# Patient Record
Sex: Male | Born: 1965
Health system: Southern US, Community
[De-identification: ages and names within clinical notes are randomized; demographics above are authoritative.]

## PROBLEM LIST (undated history)

## (undated) DIAGNOSIS — E785 Hyperlipidemia, unspecified: Secondary | ICD-10-CM

## (undated) DIAGNOSIS — E119 Type 2 diabetes mellitus without complications: Secondary | ICD-10-CM

## (undated) HISTORY — DX: Type 2 diabetes mellitus without complications: E11.9

## (undated) HISTORY — PX: ANKLE SURGERY: SHX546

## (undated) HISTORY — DX: Hyperlipidemia, unspecified: E78.5

---

## 1997-09-29 ENCOUNTER — Emergency Department (HOSPITAL_COMMUNITY): Admission: EM | Admit: 1997-09-29 | Discharge: 1997-09-29 | Payer: Self-pay | Admitting: Emergency Medicine

## 1998-04-18 ENCOUNTER — Emergency Department (HOSPITAL_COMMUNITY): Admission: EM | Admit: 1998-04-18 | Discharge: 1998-04-18 | Payer: Self-pay | Admitting: Emergency Medicine

## 1999-04-17 ENCOUNTER — Encounter: Payer: Self-pay | Admitting: Emergency Medicine

## 1999-04-17 ENCOUNTER — Emergency Department (HOSPITAL_COMMUNITY): Admission: EM | Admit: 1999-04-17 | Discharge: 1999-04-17 | Payer: Self-pay | Admitting: Emergency Medicine

## 2006-10-08 ENCOUNTER — Emergency Department (HOSPITAL_COMMUNITY): Admission: EM | Admit: 2006-10-08 | Discharge: 2006-10-08 | Payer: Self-pay | Admitting: Emergency Medicine

## 2008-05-24 ENCOUNTER — Emergency Department (HOSPITAL_COMMUNITY): Admission: EM | Admit: 2008-05-24 | Discharge: 2008-05-24 | Payer: Self-pay | Admitting: Emergency Medicine

## 2009-10-25 ENCOUNTER — Emergency Department (HOSPITAL_COMMUNITY): Admission: EM | Admit: 2009-10-25 | Discharge: 2009-10-25 | Payer: Self-pay | Admitting: Emergency Medicine

## 2009-11-02 ENCOUNTER — Ambulatory Visit (HOSPITAL_COMMUNITY): Admission: RE | Admit: 2009-11-02 | Discharge: 2009-11-02 | Payer: Self-pay | Admitting: Orthopedic Surgery

## 2010-05-15 ENCOUNTER — Inpatient Hospital Stay (INDEPENDENT_AMBULATORY_CARE_PROVIDER_SITE_OTHER)
Admission: RE | Admit: 2010-05-15 | Discharge: 2010-05-15 | Disposition: A | Discharge status: Home / Self Care | Payer: No Typology Code available for payment source | Source: Ambulatory Visit | Attending: Family Medicine | Admitting: Family Medicine

## 2010-05-15 DIAGNOSIS — S139XXA Sprain of joints and ligaments of unspecified parts of neck, initial encounter: Secondary | ICD-10-CM

## 2010-06-07 LAB — CBC
HCT: 43.8 % (ref 39.0–52.0)
MCHC: 35.4 g/dL (ref 30.0–36.0)
MCV: 87.8 fL (ref 78.0–100.0)
RDW: 12.5 % (ref 11.5–15.5)

## 2010-06-07 LAB — TYPE AND SCREEN

## 2010-06-07 LAB — SURGICAL PCR SCREEN: MRSA, PCR: NEGATIVE

## 2013-03-24 DIAGNOSIS — E119 Type 2 diabetes mellitus without complications: Secondary | ICD-10-CM

## 2013-03-24 HISTORY — DX: Type 2 diabetes mellitus without complications: E11.9

## 2014-02-22 ENCOUNTER — Ambulatory Visit (INDEPENDENT_AMBULATORY_CARE_PROVIDER_SITE_OTHER): Payer: Self-pay | Admitting: Family Medicine

## 2014-02-22 VITALS — BP 120/72 | HR 70 | Temp 98.4°F | Resp 18 | Ht 64.0 in | Wt 163.0 lb

## 2014-02-22 DIAGNOSIS — M940 Chondrocostal junction syndrome [Tietze]: Secondary | ICD-10-CM

## 2014-02-22 DIAGNOSIS — M62830 Muscle spasm of back: Secondary | ICD-10-CM

## 2014-02-22 MED ORDER — CYCLOBENZAPRINE HCL 10 MG PO TABS: 10.0000 mg | ORAL_TABLET | Freq: Every day | ORAL | Status: DC

## 2014-02-22 MED ORDER — MELOXICAM 15 MG PO TABS: 15.0000 mg | ORAL_TABLET | Freq: Every day | ORAL | Status: DC

## 2014-02-22 NOTE — Progress Notes (Signed)
    MRN: [id number] DOB: 03/28/1965  Subjective:   Arthur Howard is a 48 y.o. male presenting for 1 week history of bilateral flank/back pain L>R. Patient is a Immunologist, works with insulation, carries heavy loads. He cannot recall any specific injury or trauma, no falls from slipping outside while it was raining. His pain last 2 days, was worsened with deep breathing, had difficulty sleeping, getting comfortable in his bed. He used no medications and eventually his pain subsided, he continued work as normal. Administrator, Civil Service, his pain began again without any known precipitants, he used Tylenol once today with no relief. His pain is moderate in severity, achy with intermittent sharp pains, non-radiating, located over lateral mid back to flank sides bilaterally. He denies fevers, n/v, cough, chest pain, shob, wheezing, dysuria, hematuria, constipation, abdominal pain, decreased ROM or sensation, radiculopathy, loss of bowel or bladder control. Denies smoking (stopped 4 months ago), occasional alcohol use. Denies IV drug use. Denies any other aggravating or relieving factors, no other questions or concerns.  No current medications.  No Known Allergies  Denies past medical history.   Past Surgical History  Procedure Laterality Date  . Ankle surgery      ROS As in subjective.  Objective:   Vitals: BP 120/72 mmHg  Pulse 70  Temp(Src) 98.4 F (36.9 C) (Oral)  Resp 18  Ht 5\' 4"  (1.626 m)  Wt 163 lb (73.936 kg)  BMI 27.97 kg/m2  SpO2 96%  Physical Exam  Constitutional: He is oriented to person, place, and time and well-developed, well-nourished, and in no distress.  Cardiovascular: Normal rate, regular rhythm, normal heart sounds and intact distal pulses.  Exam reveals no gallop and no friction rub.   No murmur heard. Pulmonary/Chest: Effort normal. No respiratory distress. He has no wheezes. He has no rales. He exhibits no tenderness.  Abdominal: Soft. Bowel sounds are normal. He  exhibits no distension and no mass. There is no tenderness. There is no guarding.  Musculoskeletal: He exhibits tenderness (over postero-lateral costal area bilaterally most notable with deep inspiration). He exhibits no edema.  Excellent ROM, no spinous process or paraspinal muscle tenderness, spasms present bilaterally over thoraco-lumbar region. Negative straight leg raise.  Neurological: He is alert and oriented to person, place, and time. He has normal reflexes.  Skin: Skin is warm and dry. No rash noted. He is not diaphoretic. No erythema.  Psychiatric: Mood and affect normal.   Assessment and Plan :   1. Costochondritis - meloxicam (MOBIC) 15 MG tablet; Take 1 tablet (15 mg total) by mouth daily.  Dispense: 30 tablet; Refill: 1 - return to clinic if symptoms worsen, fail to resolve or as needed  2. Spasm of thoracolumbar muscle - f/u as above - cyclobenzaprine (FLEXERIL) 10 MG tablet; Take 1 tablet (10 mg total) by mouth at bedtime.  Dispense: 30 tablet; Refill: 0  3. Advised patient to return to clinic for an annual physical exam.   Jaynee Eagles, PA-C [facility] [phone] 02/22/2014 7:39 PM

## 2014-02-22 NOTE — Patient Instructions (Signed)
Costocondritis (Costochondritis) La costocondritis a veces llamada sndrome de [place] es la hinchazn e irritacin (inflamacin) del tejido Statistician) que une las costillas con el (esternn). Esto causa dolor en el pecho y la zona de las [place]. La costocondritis generalmente desaparece por s misma con el tiempo. Podr tardar Waylan Boga 6 semanas en curarse o ms, especialmente si no puede restringir sus actividades. CAUSAS  Algunos casos de costocondritis no tienen causa conocida. Las causas posibles son:  Kathee Delton (traumatismos).  Ejercicios o actividades relacionadas con [employer].  Tos intensa. SIGNOS Y SNTOMAS  Dolor y sensibilidad en el rea de las [place].  Dolor que empeora al toser o respirar profundamente.  Dolor que empeora con movimientos especficos. DIAGNSTICO  El mdico le preguntar acerca de sus sntomas y le har un examen fsico. Podr indicarle radiografas para descartar otros problemas. TRATAMIENTO  La costocondritis generalmente desaparece por s misma con el tiempo. El mdico podr recetar algunos medicamentos para Glass blower/designer. INSTRUCCIONES PARA EL CUIDADO EN EL HOGAR   Evite la actividad fsica extenuante. Trate de no esforzar las [employer] actividades habituales. Aqu se incluyen las actividades en las que [place] los msculos del pecho, abdominales y los msculos laterales, especialmente si debe levantar peso.  Aplique hielo en la zona afectada durante los primeros 2 das despus del inicio del dolor.  Ponga el hielo en una bolsa plstica.  Colquese una toalla entre la piel y la bolsa de hielo.  Deje el hielo durante 20 minutos, y aplquelo 2-3 veces por Training and development officer.  Tome slo medicamentos de venta libre o recetados, segn las indicaciones del mdico. SOLICITE ATENCIN MDICA SI:  Tiene hinchazn o irritacin en las articulaciones [place]. Estos son signos de infeccin.  El dolor no desaparece aunque haga reposo o tome  medicamentos para Conservation officer, historic buildings. SOLICITE ATENCIN MDICA DE INMEDIATO SI:   El dolor aumenta o siente muchas molestias.  Le falta el aire o tiene dificultad para respirar.  Tose y escupe sangre.  Siente falta de aire o dolor en el pecho, sudoracin o vmitos.  Tiene fiebre o sntomas persistentes durante ms de 2 - 3 das.  Tiene fiebre y los sntomas empeoran repentinamente. ASEGRESE DE QUE:   Comprende estas instrucciones.  Controlar su afeccin.  Recibir ayuda de inmediato si no mejora o si empeora. Document Released: 12/18/2004 Document Revised: 12/29/2012 [facility] Patient Information 2015 [place]. This information is not intended to replace advice given to you by your health care provider. Make sure you discuss any questions you have with your health care provider.

## 2014-02-23 NOTE — Addendum Note (Signed)
Addended by: Jaynee Eagles on: 02/23/2014 01:40 PM   Modules accepted: Level of Service

## 2014-02-23 NOTE — Progress Notes (Signed)
Patient discussed with Arthur Howard. Agree with assessment and plan of care per his note.   

## 2014-08-12 ENCOUNTER — Ambulatory Visit (INDEPENDENT_AMBULATORY_CARE_PROVIDER_SITE_OTHER): Payer: Self-pay | Admitting: Internal Medicine

## 2014-08-12 VITALS — BP 112/64 | HR 70 | Temp 98.3°F | Ht 64.0 in | Wt 153.8 lb

## 2014-08-12 DIAGNOSIS — E113293 Type 2 diabetes mellitus with mild nonproliferative diabetic retinopathy without macular edema, bilateral: Secondary | ICD-10-CM

## 2014-08-12 DIAGNOSIS — E119 Type 2 diabetes mellitus without complications: Secondary | ICD-10-CM | POA: Insufficient documentation

## 2014-08-12 DIAGNOSIS — E1169 Type 2 diabetes mellitus with other specified complication: Secondary | ICD-10-CM | POA: Insufficient documentation

## 2014-08-12 DIAGNOSIS — R42 Dizziness and giddiness: Secondary | ICD-10-CM

## 2014-08-12 DIAGNOSIS — E1165 Type 2 diabetes mellitus with hyperglycemia: Secondary | ICD-10-CM

## 2014-08-12 DIAGNOSIS — Z809 Family history of malignant neoplasm, unspecified: Secondary | ICD-10-CM

## 2014-08-12 DIAGNOSIS — Z7189 Other specified counseling: Secondary | ICD-10-CM

## 2014-08-12 LAB — POCT CBC
Granulocyte percent: 59.3 %G (ref 37–80)
HEMATOCRIT: 47 % (ref 43.5–53.7)
Hemoglobin: 16.1 g/dL (ref 14.1–18.1)
LYMPH, POC: 2.5 (ref 0.6–3.4)
MCH: 30.2 pg (ref 27–31.2)
MCHC: 34.2 g/dL (ref 31.8–35.4)
MCV: 88.3 fL (ref 80–97)
MID (cbc): 0.3 (ref 0–0.9)
MPV: 6.6 fL (ref 0–99.8)
PLATELET COUNT, POC: 355 10*3/uL (ref 142–424)
POC GRANULOCYTE: 4.2 (ref 2–6.9)
POC LYMPH PERCENT: 36.4 %L (ref 10–50)
POC MID %: 4.3 % (ref 0–12)
RBC: 5.32 M/uL (ref 4.69–6.13)
RDW, POC: 12.8 %
WBC: 7 10*3/uL (ref 4.6–10.2)

## 2014-08-12 LAB — COMPREHENSIVE METABOLIC PANEL
ALT: 24 U/L (ref 0–53)
AST: 21 U/L (ref 0–37)
Albumin: 4.5 g/dL (ref 3.5–5.2)
Alkaline Phosphatase: 64 U/L (ref 39–117)
BUN: 20 mg/dL (ref 6–23)
CALCIUM: 10.1 mg/dL (ref 8.4–10.5)
CO2: 26 meq/L (ref 19–32)
CREATININE: 0.96 mg/dL (ref 0.50–1.35)
Chloride: 101 mEq/L (ref 96–112)
GLUCOSE: 230 mg/dL — AB (ref 70–99)
Potassium: 5.1 mEq/L (ref 3.5–5.3)
Sodium: 136 mEq/L (ref 135–145)
TOTAL PROTEIN: 7.3 g/dL (ref 6.0–8.3)
Total Bilirubin: 0.8 mg/dL (ref 0.2–1.2)

## 2014-08-12 LAB — POCT GLYCOSYLATED HEMOGLOBIN (HGB A1C): Hemoglobin A1C: 8.6

## 2014-08-12 LAB — GLUCOSE, POCT (MANUAL RESULT ENTRY): POC Glucose: 220 mg/dl — AB (ref 70–99)

## 2014-08-12 MED ORDER — METFORMIN HCL 500 MG PO TABS: 500.0000 mg | ORAL_TABLET | Freq: Two times a day (BID) | ORAL | Status: DC

## 2014-08-12 NOTE — Patient Instructions (Addendum)
Immunization Schedule, Adult  Influenza vaccine.  All adults should be immunized every year.  All adults, including pregnant women and people with hives-only allergy to eggs can receive the inactivated influenza (IIV) vaccine.  Adults aged 51-49 years can receive the recombinant influenza (RIV) vaccine. The RIV vaccine does not contain any egg protein.  Adults aged 76 years or older can receive the standard-dose IIV or the high-dose IIV.  Tetanus, diphtheria, and acellular pertussis (Td, Tdap) vaccine.  Pregnant women should receive 1 dose of Tdap vaccine during each pregnancy. The dose should be obtained regardless of the length of time since the last dose. Immunization is preferred during the 27th to 36th week of gestation.  An adult who has not previously received Tdap or who does not know his or her vaccine status should receive 1 dose of Tdap. This initial dose should be followed by tetanus and diphtheria toxoids (Td) booster doses every 10 years.  Adults with an unknown or incomplete history of completing a 3-dose immunization series with Td-containing vaccines should begin or complete a primary immunization series including a Tdap dose.  Adults should receive a Td booster every 10 years.  Varicella vaccine.  An adult without evidence of immunity to varicella should receive 2 doses or a second dose if he or she has previously received 1 dose.  Pregnant females who do not have evidence of immunity should receive the first dose after pregnancy. This first dose should be obtained before leaving the health care facility. The second dose should be obtained 4-8 weeks after the first dose.  Human papillomavirus (HPV) vaccine.  Females aged 13-26 years who have not received the vaccine previously should obtain the 3-dose series.  The vaccine is not recommended for use in pregnant females. However, pregnancy testing is not needed before receiving a dose. If a male is found to be  pregnant after receiving a dose, no treatment is needed. In that case, the remaining doses should be delayed until after the pregnancy.  Males aged 37-21 years who have not received the vaccine previously should receive the 3-dose series. Males aged 22-26 years may be immunized.  Immunization is recommended through the age of 93 years for any male who has sex with males and did not get any or all doses earlier.  Immunization is recommended for any person with an immunocompromised condition through the age of 71 years if he or she did not get any or all doses earlier.  During the 3-dose series, the second dose should be obtained 4-8 weeks after the first dose. The third dose should be obtained 24 weeks after the first dose and 16 weeks after the second dose.  Zoster vaccine.  One dose is recommended for adults aged 73 years or older unless certain conditions are present.  Measles, mumps, and rubella (MMR) vaccine.  Adults born before 35 generally are considered immune to measles and mumps.  Adults born in [birth year] or later should have 1 or more doses of MMR vaccine unless there is a contraindication to the vaccine or there is laboratory evidence of immunity to each of the three diseases.  A routine second dose of MMR vaccine should be obtained at least 28 days after the first dose for students attending postsecondary schools, health care workers, or international travelers.  People who received inactivated measles vaccine or an unknown type of measles vaccine during 1963-1967 should receive 2 doses of MMR vaccine.  People who received inactivated mumps vaccine or an unknown type  of mumps vaccine before 1979 and are at high risk for mumps infection should consider immunization with 2 doses of MMR vaccine.  For females of childbearing age, rubella immunity should be determined. If there is no evidence of immunity, females who are not pregnant should be vaccinated. If there is no evidence of  immunity, females who are pregnant should delay immunization until after pregnancy.  Unvaccinated health care workers born before 1957 who lack laboratory evidence of measles, mumps, or rubella immunity or laboratory confirmation of disease should consider measles and mumps immunization with 2 doses of MMR vaccine or rubella immunization with 1 dose of MMR vaccine.  Pneumococcal 13-valent conjugate (PCV13) vaccine.  When indicated, a person who is uncertain of his or her immunization history and has no record of immunization should receive the PCV13 vaccine.  An adult aged 19 years or older who has certain medical conditions and has not been previously immunized should receive 1 dose of PCV13 vaccine. This PCV13 should be followed with a dose of pneumococcal polysaccharide (PPSV23) vaccine. The PPSV23 vaccine dose should be obtained at least 8 weeks after the dose of PCV13 vaccine.  An adult aged 19 years or older who has certain medical conditions and previously received 1 or more doses of PPSV23 vaccine should receive 1 dose of PCV13. The PCV13 vaccine dose should be obtained 1 or more years after the last PPSV23 vaccine dose.  Pneumococcal polysaccharide (PPSV23) vaccine.  When PCV13 is also indicated, PCV13 should be obtained first.  All adults aged 65 years and older should be immunized.  An adult younger than age 65 years who has certain medical conditions should be immunized.  Any person who resides in a nursing home or long-term care facility should be immunized.  An adult smoker should be immunized.  People with an immunocompromised condition and certain other conditions should receive both PCV13 and PPSV23 vaccines.  People with human immunodeficiency virus (HIV) infection should be immunized as soon as possible after diagnosis.  Immunization during chemotherapy or radiation therapy should be avoided.  Routine use of PPSV23 vaccine is not recommended for American Indians,  [place] Natives, or people younger than 65 years unless there are medical conditions that require PPSV23 vaccine.  When indicated, people who have unknown immunization and have no record of immunization should receive PPSV23 vaccine.  One-time revaccination 5 years after the first dose of PPSV23 is recommended for people aged 19-64 years who have chronic kidney failure, nephrotic syndrome, asplenia, or immunocompromised conditions.  People who received 1-2 doses of PPSV23 before age 65 years should receive another dose of PPSV23 vaccine at age 65 years or later if at least 5 years have passed since the previous dose.  Doses of PPSV23 are not needed for people immunized with PPSV23 at or after age 65 years.  Meningococcal vaccine.  Adults with asplenia or persistent complement component deficiencies should receive 2 doses of quadrivalent meningococcal conjugate (MenACWY-D) vaccine. The doses should be obtained at least 2 months apart.  Microbiologists working with certain meningococcal bacteria, military recruits, people at risk during an outbreak, and people who travel to or live in countries with a high rate of meningitis should be immunized.  A first-year college student up through age 21 years who is living in a residence hall should receive a dose if he or she did not receive a dose on or after his or her 16th birthday.  Adults who have certain high-risk conditions should receive one or more doses   of vaccine.  Hepatitis A vaccine.  Adults who wish to be protected from this disease, have certain high-risk conditions, work with hepatitis A-infected animals, work in hepatitis A research labs, or travel to or work in countries with a high rate of hepatitis A should be immunized.  Adults who were previously unvaccinated and who anticipate close contact with an international adoptee during the first 60 days after arrival in the [place] from a country with a high rate of hepatitis A should  be immunized.  Hepatitis B vaccine.  Adults who wish to be protected from this disease, have certain high-risk conditions, may be exposed to blood or other infectious body fluids, are household contacts or sex partners of hepatitis B positive people, are clients or workers in certain care facilities, or travel to or work in countries with a high rate of hepatitis B should be immunized.  Haemophilus influenzae type b (Hib) vaccine.  A previously unvaccinated person with asplenia or sickle cell disease or having a scheduled splenectomy should receive 1 dose of Hib vaccine.  Regardless of previous immunization, a recipient of a hematopoietic stem cell transplant should receive a 3-dose series 6-12 months after his or her successful transplant.  Hib vaccine is not recommended for adults with HIV infection. Document Released: 05/31/2003 Document Revised: 07/05/2012 Document Reviewed: 04/27/2012 [facility] Patient Information 2015 [place], [place]. This information is not intended to replace advice given to you by your health care provider. Make sure you discuss any questions you have with your health care provider. Calendario de vacunacin - Adultos  (Immunization Schedule, Adult)  [place] antigripal.  Todos los adultos deben vacunarse todos los [place].  [employer], incluidas las mujeres embarazadas y las personas con urticaria por alergia al huevo pueden recibir la vacuna antigripal inactivada (IIV).  Los [employer] 18 y 68 aos pueden recibir la vacuna contra la influenza recombinante (RIV). La vacuna RIV no contiene protenas del huevo.  Los adultos de 65 aos o ms pueden recibir la dosis de IIV estndar o IIV en dosis altas.  Vacuna contra difteria, ttanos y tos Dietitian (Td,Tdap).  Las mujeres embarazadas deben recibir 1 dosis de vacuna Tdap en cada embarazo. La dosis debe aplicarse independientemente del tiempo transcurrido desde la ltima dosis. Se prefiere la vacunacin  entre las semanas 27 y 16 de gestacin.  Un adulto que no ha recibido Garment/textile technologist vacuna Tdap o que no sabe su estado de vacunacin deben recibir 1 dosis. Esta dosis inicial debe ser seguida por una dosis de refuerzo de toxoides tetnico y diftrico (Td) cada 10 aos.  Los adultos con una historia desconocida o incompleta de vacunacin de 3 dosis de las vacunas Td deben comenzar o completar una serie de vacunas primaria incluyendo una dosis de Tdap.  Los adultos deben recibir una dosis de refuerzo de Td cada 10 aos.  Vacuna contra la varicela.  Todos los adultos sin evidencia de inmunidad a la varicela deben recibir 2 dosis o una segunda dosis si slo han recibido 1 dosis.  Las mujeres embarazadas que no tengan evidencia de inmunidad deben recibir la primera dosis despus del Media planner. La primera dosis debe aplicarse antes de abandonar el establecimiento sanitario. La segunda dosis debe aplicarse de 4 a 8 semanas despus de la primera dosis.  Vacuna contra el virus del Engineer, technical sales (VPH).  Las mujeres de 13 a 26 aos que no han recibido la vacuna previamente deben recibir Ardelia Mems serie de 3 dosis.  La vacuna no  se recomienda en mujeres embarazadas. Sin embargo, no es Chartered loss adjuster una prueba de [place] antes de recibir una dosis. Si se comprueba que una mujer est embarazada despus de haber recibido Ardelia Mems dosis, no necesita tratamiento. En ese caso, las dosis restantes deben retrasarse hasta despus del embarazo.  Los hombres de 13 a 21 aos que no han recibido la vacuna previamente deben recibir Ardelia Mems serie de 3 dosis. Los [employer] 22 y 80 aos deben vacunarse.  Se recomienda [employer] [address] a todos los hombres que tengan sexo con hombres y que no recibieron ninguna dosis antes.  Se recomienda la aplicacin de la vacuna a cualquier persona con una enfermedad por inmunodeficiencia [employer] 26 aos, si no recibi [place] o ninguna de las dosis [place].  Durante la  serie de 3 dosis, la segunda dosis debe aplicarse de 4 a 8 semanas despus de la primera dosis. La tercera dosis debe aplicarse 24 semanas despus de la primera dosis y 16 semanas despus de la segunda dosis.  Vacuna contra el herpes zoster.  Se recomienda una dosis en adultos mayores de 54 aos a menos que sufran ciertas enfermedades.  Vacuna triple viral (sarampin, paperas y [place]) o MMR por su siglas en ingls.  Los adultos nacidos antes de 1957 generalmente se consideran inmunes al sarampin y las paperas.  Los adultos nacidos en 1959 o ms tarde deben recibir 1 o ms dosis de la vacuna MMR, a menos que [employer] contraindicacin para la vacuna o que tengan prueba de inmunidad a las tres enfermedades.  Los estudiantes que asisten a escuelas de educacin superior, los trabajadores de la salud o los viajeros internacionales deben aplicarse una segunda dosis de rutina de la vacuna MMR por lo menos 28 das despus de la primera dosis.  Las personas que recibieron la vacuna inactivada contra el sarampin o un tipo desconocido de [employer] [place] y1967 deben recibir 2 dosis de la vacuna MMR.  Las personas que recibieron la vacuna inactivada contra las paperas o un tipo desconocido de vacuna contra las paperas antes de 1979 y se encuentran en alto riesgo de infeccin por paperas deben considerar la vacunacin con 2 dosis de la vacuna MMR.  En las mujeres en edad frtil, debe determinarse la inmunidad contra la rubola. Si no hay prueba de inmunidad, las mujeres que no estn embarazadas deben vacunarse. Si no hay prueba de inmunidad, las mujeres que estn embarazadas deben posponer la vacunacin hasta despus de su embarazo.  Los trabajadores de [employer]	[place] no vacunados nacidos antes de 1957 que no tengan evidencia de laboratorio de sarampin de inmunidad a las paperas o rubola o la confirmacin de laboratorio de la enfermedad deben considerar vacunarse contra el sarampin y  las paperas con 2 dosis de la vacuna MMR o vacunarse contra la rubola con 1 dosis de la vacuna MMR.  Vacuna antineumoccica conjugada 13 valente (PCV13).  Cuando est indicado, una persona que tenga dudas sobre su historial de vacunacin y no tenga antecedentes de vacunacin, debe recibir la vacuna PCV13.  Un adulto de19 aos o ms, que sufra ciertas enfermedades y no haya sido vacunado previamente deben recibir 1 dosis de la vacuna PCV13. Despus de la OMB55 debe aplicarse una dosis de la vacuna antineumoccica de polisacridos (PPSV23). La dosis de la vacuna HRCB63 se debe aplicar por lo menos 8 semanas despus de la dosis de la vacuna PCV13.  Un adulto de 19 aos o ms que sufra  ciertas enfermedades y que haya recibido anteriormente1 o ms dosis de la vacuna PPSV23 debe recibir 1 dosis de PCV13. La dosis de la vacuna JEH63 se debe aplicar 1 o ms aos despus de la ltima dosis de la vacuna PPSV23.  Vacuna antineumoccica de polisacridos (PPSV23).  Cuando se indica la vacuna JSH70, debe aplicarse primero.  [employer] de 65 aos o mayores deben recibir 1 dosis.  Los [employer] de 65 aos que sufran ciertas enfermedades, deben vacunarse.  Cualquier persona que resida en un hogar de ancianos o centro de atencin permanente debe vacunarse.  Un fumador adulto debe vacunarse.  Las personas con ciertas enfermedades por inmunodeficiencia y otras enfermedades deben recibir tanto la PCV13 como la PPSV23.  Las personas con el virus de inmunodeficiencia humana (Lorenzo ) deben vacunarse tan pronto como sea posible despus del diagnstico.  La vacunacin durante la quimioterapia o la terapia de radiacin debe evitarse.  No se recomienda el uso rutinario de la vacuna PPSV23 en los indoamericanos, los nativos de [place] o personas menores de 65 aos excepto que existan condiciones mdicas que requieren la vacuna PPSV23.  Cuando est indicado, las personas que no saben si han sido vacunadas  y no tienen antecedentes de vacunacin deben recibir la vacuna PPSV23.  Se recomienda a las [employer] 19 y 61 aos con insuficiencia renal crnica, sndrome nefrtico, asplenia o pacientes inmunocomprometidos, la revacunacin por nica vez 5 aos despus de la primera dosis de PPSV23.  Las [employer] recibieron 1 o 2 dosis de PPSV23 antes de los 65 aos de edad deben recibir otra dosis a los 65 aos o ms tarde si han pasado al menos 5 aos desde la ltima dosis.  Las dosis de PPSV23 no son necesarias para las personas vacunadas despus de la edad de 21 aos.  Vacuna antimeningoccica.  Los adultos con asplenia o deficiencias persistentes de componentes complementarios deben recibir 2 dosis de la vacuna cuadrivalente meningoccica conjugada (MenACWY-D). Estas dosis deben administrarse al menos con 2 meses de diferencia.  Los microbilogos que trabajan con ciertas bacterias meningoccicas, los militares reclutados, las personas en riesgo durante un brote y los que viajen o vivan en pases con alta tasa de meningitis, deben vacunarse.  Los estudiantes universitarios del Tourist information centre manager la edad de 21 aos que viven en residencias deben recibir 1 dosis si no la recibieron durante o despus de su cumpleaos nmero 16.  Los adultos que sufren ciertas enfermedades de alto riesgo deben recibir una o ms dosis de la vacuna.  Vacuna contra la hepatitis A.  Los adultos que deseen estar protegidos contra esta enfermedad, que sufren ciertas enfermedades de alto riesgo, los que trabajan con animales infectados con hepatitis A, los que trabajan en los laboratorios de investigacin de hepatitis A o que viajan o trabajan en pases con una alta tasa de hepatitis A deben vacunarse.  Los adultos que no fueron vacunados previamente y que Lucianne Lei a tener un contacto cercano con una persona adoptada fuera del pas, deben recibir la vacuna durante los primeros [address] despus de su llegada a los [place]  desde un pas con una alta tasa de hepatitis A.  Vacuna contra la hepatitis B.  Los adultos que deseen estar protegidos contra esta enfermedad, que sufren ciertas enfermedades de alto riesgo, que puedan estar expuestos a sangre u otros fluidos corporales infecciosos, que tienen contactos familiares o parejas sexuales con hepatitis B positivo, que sean clientes o trabajadores de ciertos centros de [place],  o que viajan o trabajan en pases con una alta tasa de hepatitis B deben vacunarse.  Haemophilus influenzae tipo b (Hib).  Una persona que no ha sido vacunada que sufre asplenia o anemia de clulas falciformes o tiene una esplenectoma programada, debe recibir 1 dosis de la vacuna Hib.  Independientemente de la vacunacin anterior, un receptor de un trasplante de clulas madre hematopoyticas debe recibir Ardelia Mems serie de 3 dosis, 6 a12 meses despus de su trasplante exitoso.  La vacuna Hib no se recomienda para los adultos con infeccin por VIH. Document Released: 03/10/2005 Document Revised: 07/05/2012 [facility] Patient Information 2015 [place], [place]. This information is not intended to replace advice given to you by your health care provider. Make sure you discuss any questions you have with your health care provider. Hiperplasia prosttica benigna  (Benign Prostatic Hypertrophy) La glndula prosttica forma parte del sistema reproductor masculino. Una prstata normal tiene aproximadamente el tamao de [place]. Produce un lquido que se mezcla con el esperma para formar el semen. Esta glndula rodea la uretra y se [place] en frente del recto y por debajo de la vejiga. La vejiga es TEFL teacher en el que se acumula la [place]. La uretra es el conducto que lleva la orina desde la vejiga hacia el exterior del cuerpo. La prstata crece a medida que el hombre envejece. Cuando la prstata se agranda por otras causas que no sean cncer, producen un trastorno llamado hipertrofia prosttica benigna (HPB). Una  prstata agrandada puede presionar la uretra. Esto puede dificultar el pasaje de la orina. En las primeras etapas del agrandamiento, la vejiga puede adaptarse al fortalecer los msculos para impulsar la orina en la uretra ms estrecha. Si el problema contina o Boykin Reaper, requerir tratamiento mdico o quirrgico.  Esta afeccin debe ser controlada por su mdico. La acumulacin de orina en la vejiga puede causar una infeccin. La presin y la infeccin pueden llevar a un dao en la vejiga y a una insuficiencia en los riones (renal). Si es necesario, el mdico podr derivarlo a Teaching laboratory technician en enfermedades del rin y de la prstata (urlogo). CAUSAS  La hiperplasia prosttica benigna es un problema en hombres mayores de 78 aos. Esta afeccin es una parte normal del envejecimiento. Sin embargo, no todos los Energy manager por esta afeccin. Si el agrandamiento va ms all de la uretra, no habr compresin de la uretra ni resistencia al flujo de la orina. Si el agrandamiento es [employer] uretra y la comprime, experimentar dificultad para Garment/textile technologist.  SNTOMAS   Dificultad para vaciar completamente la vejiga.  Levantarse con frecuencia durante la noche para orinar.  Necesidad de Garment/textile technologist con ms frecuencia Agricultural consultant.  Dificultad para comenzar a eliminar la orina.  Disminucin del tamao y la fuerza del chorro de [place].  Goteo al terminar de Garment/textile technologist.  Dolor al orinar (ms frecuente en caso de infeccin).  Imposibilidad para orinar. En este caso es necesario un tratamiento inmediato.  Desarrollo de una infeccin en el tracto urinario. DIAGNSTICO  Estos estudios ayudarn al mdico a diagnosticar el problema.  Una exhaustiva historia clnica y examen fsico.  Historia de su forma de orinar, con el nmero de veces y la cantidad que orina, la fuerza del chorro de [place] y la sensacin de haber vaciado o no la vejiga despus de Garment/textile technologist.  Un estudio despus de vaciar la vejiga que  mide la cantidad de orina que queda en la vejiga despus de terminar de Garment/textile technologist.  Examen rectal digital.  En el examen rectal digital, el mdico controla la prstata colocando un dedo enguantado y lubricado en el recto para sentir la parte posterior de la prstata. En este estudio se detecta el tamao de la glndula y si hay bultos o crecimientos.  Anlisis de orina (urinlisis).  Estudio de antgeno prosttico [place] es un anlisis de sangre que se utiliza para Electrical engineer de prstata.  Ecografa rectal. En este estudio se utilizan ondas de sonido para producir de Publishing copy una imagen de la glndula prosttica. TRATAMIENTO  Una vez que los sntomas comienzan, el mdico controlar la afeccin. De los hombres que sufren esta afeccin, un tercio tendr sntomas que se estabilizan, un tercio tendr sntomas que mejoran y un tercio tendr sntomas que Set designer. Los sntomas leves pueden no necesitar tratamiento. Una simple observacin y exmenes anuales pudiera ser todo lo que necesita. Los medicamentos y la [place] son opciones para los problemas ms graves. El mdico lo ayudar a tomar una decisin acerca de lo que resulte lo mejor para usted. Dos tipos de medicamentos estn disponibles para el alivio de los sntomas prostticos:  Hay medicamentos para Contractor. Esto ayuda a [employer]. Estos medicamentos tardan en Chief of Staff y pueden pasar meses antes de que se observe alguna mejora.  Los efectos adversos poco frecuentes incluyen problemas con la funcin sexual.  Medicamentos para relajar el msculo de la prstata. Esto tambin [employer] la obstruccin al reducir la compresin sobre la uretra. Este grupo de medicamentos funciona ms rpido que aquellos que reducen el tamao de la glndula prosttica. Generalmente se experimenta una mejora en [employer].  Los efectos adversos incluyen [place], [place], [place] varios  tipos de tratamiento quirrgico disponibles para Public house manager los sntomas de la prstata:  Reseccin transuretral de la prstata (RTUP): En este tipo de tratamiento, se inserta un instrumento a travs de la abertura de la punta del pene. Se cortan trozos del Freight forwarder. Los trozos se retiran a Designer, jewellery de la misma abertura del pene. Esto mejora la obstruccin y [place] a [employer] sntomas.  Incisin transuretral (ITUP): En este procedimiento se hacen pequeos cortes en la prstata. sto hace que se alivie la presin de la prstata sobre la uretra.  Termoterapia transuretral con microondas (TTUM): En este procedimiento se utilizan microondas para crear calor. El calor destruye y extirpa una pequea cantidad de tejido prosttico.  Ablacin transuretral con aguja (ATUA): Este es un procedimiento en el que se utilizan frecuencias de radio para Field seismologist lo mismo que en el TTUM.  Coagulacin intersticial con lser (CIL): En este procedimiento se utiliza un rayo lser para hacer lo mismo que en el TTUM y el ATUA.  Electrovaporizacin transuretral (EVTU): En este procedimiento se utilizan electrodos para hacer los mismo que en los procedimientos enumerados anteriormente. SOLICITE ATENCIN MDICA SI:   Jaclynn Guarneri.  Siente un dolor inexplicable en la espalda.  Los sntomas no se alivian con los medicamentos recetados.  Los [employer] Corporation causan [employer].  La orina se vuelve muy oscura o tiene mal olor.  Siente que la parte inferior del abdomen est distendida y tiene dificultad para Garment/textile technologist. SOLICITE ATENCIN MDICA DE INMEDIATO SI:   Repentinamente no puede orinar. Esto es Engineer, maintenance (IT). Debe ser atendido inmediatamente.  Observa gran cantidad de sangre o cogulos sanguneos en la orina.  No puede controlar sus problemas urinarios.  Se siente confundido, tiene mareos intensos o se desmaya.  Siente dolor  moderado a intenso en la espalda o en un flanco.  Siente  escalofros o le sube la fiebre. Document Released: 03/10/2005 Document Revised: 03/15/2013 [facility] Patient Information 2015 [place]. This information is not intended to replace advice given to you by your health care provider. Make sure you discuss any questions you have with your health care provider. Metformin tablets Qu es este medicamento? La METFORMINA se [place] para tratar la diabetes tipo 2. Ayuda a Advice worker de Dispensing optician. El tratamiento se [place] con ejercicios y [place]. Este [employer] se puede usar solo o con otros medicamentos para la diabetes, incluyendo la insulina. Este medicamento puede ser utilizado para otros usos; si tiene alguna pregunta consulte con su proveedor de atencin mdica o con su farmacutico. MARCAS COMERCIALES DISPONIBLES: Glucophage Qu le debo informar a mi profesional de la salud antes de tomar este medicamento? Necesita saber si usted presenta alguno de los siguientes problemas o situaciones: -anemia -si consume bebidas alcohlicas con frecuencia -se deshidrata con facilidad -ataque cardiaco -insuficiencia cardiaca tratada con medicamentos -enfermedad renal -enfermedad heptica -ovarios poliqusticos -infeccin o lesin severa -vmito -una reaccin alrgica o inusual a la metformina, a otros medicamentos, alimentos, colorantes o conservantes -si est embarazada o buscando quedar embarazada -si est amamantando a un beb Cmo debo utilizar este medicamento? Tome este medicamento por va oral. Tmelo con las comidas. Trague las tabletas con un vaso de agua. Siga las instrucciones de la etiqueta del [place]. Tome sus dosis a intervalos regulares. No tome su medicamento con una frecuencia mayor a la indicada. Hable con su pediatra para informarse acerca del uso de este medicamento en nios. Aunque este medicamento se puede recetar a nios tan menores como de 10 aos de edad para condiciones selectivas, las precauciones se  aplican. Sobredosis: Pngase en contacto inmediatamente con un centro toxicolgico o una sala de urgencia si usted cree que haya tomado demasiado medicamento. ATENCIN: [employer] es solo para usted. No comparta este medicamento con nadie. Qu sucede si me olvido de una dosis? Si olvida una dosis, tmela lo antes posible. Si es casi la hora de su dosis siguiente, tome slo esa dosis. No tome dosis adicionales o dobles. Qu puede interactuar con este medicamento? No tome esta medicina con ninguno de los siguientes medicamentos: -dofetilida -gatifloxacino -ciertos agentes de contraste administrados antes de un procedimiento con rayos X, tomografas computadas (CT), MRI u otros procedimientos Muchos medicamentos pueden aumentar o reducir el nivel de Dispensing optician, tales como: -digoxina -diurticos -hormonas femeninas, como estrgenos, progestinas o pldoras anticonceptivas -isoniazida -medicamentos para presin sangunea, enfermedad cardiaca, pulso cardiaco irregular -morfina -cido nicotnico -fenotiazinas, tales como clorpromacina, mesoridazina, proclorperazina, tioridazina -fenitona -procainamida -quinidina -quinina -ranitidina -medicamentos esteroideos, como la prednisona o la cortisona -medicamentos estimulantes para trastornos de Freight forwarder, perder peso o mantenerse despierto -medicamentos tiroideos -trimetoprima -vancomicina Puede ser que esta lista no menciona todas las posibles interacciones. Informe a su profesional de [employer]	[place] de [employer] productos a base de hierbas, medicamentos de [place] o suplementos nutritivos que est tomando. Si usted fuma, consume bebidas alcohlicas o si utiliza drogas ilegales, indqueselo tambin a su profesional de KB Home	[place]. Algunas sustancias pueden interactuar con su medicamento. A qu debo estar atento al usar Coca-Cola? Visite a su mdico o a su profesional de la salud para chequear su evolucin peridicamente. Un  examen llamado HbA1C (A1C) ser monitoreado. Es un simple examen de [place]. Mide su control de azcar en la sangre durante los ltimos  2 a 3 meses. Usted recibir [employer] cada 3 a 6 meses. Aprenda cmo controlar el nivel de azcar en la sangre. Aprenda a reconocer los sntomas de bajo y alto nivel de azcar en la sangre y cmo tratarlos. Siempre lleve consigo una fuente rpida de azcar por si acaso experimenta sntomas de bajo nivel de azcar en la sangre. Ejemplos incluyen caramelos duros o tabletas de glucosa. Asegrese de que los miembros de su familia sepan que se puede ahogar si come o bebe mientras tiene sntomas graves de bajo nivel de azcar en la sangre, tales como convulsiones o prdida del conocimiento. Deben obtener ayuda mdica inmediatamente. Informe a su mdico o a su profesional de la salud si tiene alto nivel de Dispensing optician. Tal vez sea necesario cambiar la dosis de su medicamento. Si est enfermo o haciendo mucho ms ejercicio que el habitual, puede ser necesario cambiar la dosis de su medicamento. No se salte comidas. Pregunte a su mdico o a su profesional de la salud si debe evitar el consumo de alcohol. Muchos productos de venta libre para tos y resfros contienen azcar y alcohol. Estos pueden Magazine features editor de azcar en la sangre. Este medicamento puede provocar la ovulacin en mujeres premenopusicas que no tienen periodos menstruales regulares. Esto puede aumentar la posibilidad de [place]. No debe tomar este medicamento si se queda embarazada o si cree que est embarazada. Consulte a su mdico o su profesional de la salud sobre sus opciones anticonceptivas mientras est tomando Coca[employer]Cola. Si cree que est embarazada, consulte a su mdico o su profesional de la salud inmediatamente. Si va a someterse a una operacin, IRM (MRI), tomografa computarizada u otro procedimiento, informe a su mdico que est tomando Coca-Cola. Usted podr necesitar  dejar de tomar este medicamento antes del procedimiento. Use una pulsera o cadena de identificacin mdica. Lleve consigo una tarjeta de identificacin con informacin sobre su enfermedad y Scientist, research (medical) de sus medicamentos y los horarios de las dosis. Qu efectos secundarios puedo tener al [employer] este medicamento? Efectos secundarios que debe informar a su mdico o a Barrister's clerk de la salud tan pronto como sea posible: -Chief of Staff como erupcin cutnea, picazn o urticarias, hinchazn de la cara, labios o lengua -problemas respiratorios -sensacin de desmayos o aturdimiento, cadas -dolores o molestias musculares -signos o sntomas de bajo nivel de azcar en la sangre tales como sentirse ansioso, confusin, mareos, aumento de apetito, debilidad o cansancio inusual, sudoracin, temblores, fro, irritabilidad, dolor de cabeza, visin borrosa, pulso cardaco rpido, prdida del conocimiento -pulso cardiaco irregular o lento -molestias o dolor de estmago inusual -cansancio o debilidad inusual Efectos secundarios que, por lo general, no requieren atencin mdica (debe informarlos a su mdico o a su profesional de la salud si persisten o si son molestos): -diarrea -dolor de cabeza Victorio Palm de estmago -sabor metlico en la boca -nuseas -molestias estomacales, gases Puede ser que esta lista no menciona todos los posibles efectos secundarios. Comunquese a su mdico por asesoramiento mdico Humana Inc. Usted puede informar los efectos secundarios a la FDA por telfono al 1-800-FDA-1088. Dnde debo guardar mi medicina? Mantngala fuera del alcance de los nios. Gurdela a [employer], entre 15 y 56 grados C (32 y 35 grados F). Protjala de la humedad y de Naval architect. Deseche todo el medicamento que no haya utilizado, despus de la fecha de vencimiento. ATENCIN: Este folleto es un resumen. Puede ser que no cubra toda la posible informacin. Si usted  tiene preguntas  acerca de esta medicina, consulte con su mdico, su farmacutico o su profesional de Technical sales engineer.  2015, Elsevier/Gold Standard. (2013-01-26 16:15:26) Diabetes y Kandace Blitz fsica (Diabetes and Exercise) Hacer actividad fsica con regularidad es muy importante. No se trata solo de [employer] Omaha. Tiene muchos otros beneficios, como por ejemplo:  Mejorar el estado fsico, la flexibilidad y la resistencia.  Aumenta la densidad sea.  Ayuda a Technical sales engineer.  Disminuye la Air traffic controller.  Aumenta la fuerza muscular.  Reduce el estrs y las tensiones.  Mejora el estado de salud general. Las personas diabticas que realizan actividad fsica tienen beneficios adicionales debido al ejercicio:  Reduce el apetito.  El organismo mejora el uso del azcar (glucosa) de la [place].  Ayuda a disminuir o Product/process development scientist.  Disminuye la presin arterial.  Ayuda a disminuir los lpidos en la sangre (colesterol y triglicridos).  El organismo mejora el uso de la insulina porque:  Aumenta la sensibilidad del organismo a la insulina.  Reduce las necesidades de insulina del organismo.  Disminuye el riesgo de enfermedad cardaca por la actividad fsica ya que  disminuye el colesterol y [employer] triglicridos.  Aumenta los niveles de colesterol bueno (como las lipoprotenas de alta densidad [HDL]) en el organismo.  Disminuye los niveles de glucosa en la [place]. SU PLAN DE ACTIVIDAD  Elija una actividad que disfrute y establezca objetivos realistas. Su mdico o educador en diabetes podrn ayudarlo a encontrar una actividad que lo beneficie. Haga ejercicio regularmente como se lo haya indicado el mdico. Esto incluye:  Hacer entrenamiento de [employer] a la semana, como flexiones, sentadillas, levantar peso o usar bandas de resistencia.  Practicar 184minutos de ejercicios cardiovasculares cada semana, como caminar, correr o hacer algn deporte.  Mantenerse activo  y no permanecer inactivo durante ms de 8minutos seguidos. Los perodos cortos de [place] tambin son beneficiosos. Tres sesiones de 27minutos a lo largo del da son tan beneficiosas como una sola sesin de 41minutos. Estas son algunas ideas para los ejercicios:  Lleve a Probation officer.  Utilice las Clinical cytogeneticist del ascensor.  Baile su cancin favorita.  Haga los ejercicios de un video de ejercicios.  Haga sus ejercicios favoritos con Gaffer. RECOMENDACIONES PARA REALIZAR EJERCICIOS CUANDO SE TIENE DIABETES TIPO 1 O TIPO 2   Controle la glucosa en la sangre antes de comenzar. Si el nivel de glucosa en la sangre es de ms de 240 mg/dl, controle las cetonas en la [place]. No haga actividad fsica si hay cetonas.  Evite inyectarse insulina en las zonas del cuerpo que ejercitar. Por ejemplo, evite inyectarse insulina en:  Los brazos, si juega al tenis.  Las piernas, si corre.  Lleve un registro de:  Los alimentos que consume antes y despus de TEFL teacher.  Los momentos esperables de picos de accin de la insulina.  Los niveles de glucosa en la sangre antes y despus de hacer ejercicios.  El tipo y cantidad de [place] fsica que Musician.  Revise los registros con su mdico. El mdico lo ayudar a Actor pautas para ajustar la cantidad de alimento y las cantidades de insulina antes y despus de Field seismologist ejercicios.  Si toma insulina o agentes hipoglucemiantes por va oral, observe si hay signos y sntomas de hipoglucemia. Entre los que se incluyen:  Mareos.  Temblores.  Sudoracin.  Escalofros.  Confusin.  Beba gran cantidad de agua mientras hace ejercicios para evitar la deshidratacin o los [employer]  de calor. Durante la actividad fsica se pierde agua corporal que se debe reponer.  Comente con su mdico antes de comenzar un programa de actividad fsica para verificar que sea seguro para usted. Recuerde, cualquier actividad es mejor que  ninguna. Document Released: 03/30/2007 Document Revised: 07/25/2013 [facility] Patient Information 2015 [place], [place]. This information is not intended to replace advice given to you by your health care provider. Make sure you discuss any questions you have with your health care provider. Recuento bsico de carbohidratos para la diabetes mellitus (Basic Carbohydrate Counting for Diabetes Mellitus) El recuento de carbohidratos es un mtodo destinado a calcular la cantidad de carbohidratos en la dieta. El consumo de carbohidratos aumenta naturalmente el nivel de azcar (glucosa) en la sangre, por lo que es importante que sepa la cantidad que debe incluir en cada comida. El recuento de carbohidratos ayuda a Advertising account executive de glucosa en la sangre dentro de los lmites normales. La cantidad permitida de carbohidratos es diferente para cada persona. Un nutricionista puede ayudarlo a calcular la cantidad adecuada para usted. Una vez que sepa la cantidad de carbohidratos que puede consumir, podr calcular los carbohidratos de los alimentos que desea comer. Los siguientes alimentos incluyen carbohidratos:  Granos, como panes y cereales.  Frijoles secos y productos con soja.  Vegetales almidonados, como papas, guisantes y maz.  Lambert Mody y jugos de frutas.  Leche y Estate agent.  Dulces y bocadillos, como pastel, galletas, caramelos, papas fritas de bolsa, refrescos y bebidas frutales con azcar. RECUENTO DE CARBOHIDRATOS [employer] de calcular los carbohidratos de los alimentos. Puede usar cualquiera de los dos mtodos o [place] combinacin de [place]. Leer la etiqueta de informacin nutricional de los alimentos envasados La informacin nutricional es una etiqueta incluida en casi todas las bebidas y los alimentos envasados de los [place]. Indica el tamao de la porcin de ese alimento o bebida e informacin sobre los nutrientes de cada porcin, incluso los gramos (g) de carbohidratos por porcin.   Decida la cantidad de porciones que comer o tomar de este alimento o bebida. Multiplique la cantidad de porciones por el nmero de gramos de carbohidratos indicados en la etiqueta para esa porcin. El total ser la cantidad de carbohidratos que consumir al comer ese alimento o tomar esa bebida. Conocer las porciones estndar de los alimentos Cuando coma alimentos no envasados o que no incluyan la informacin nutricional en la etiqueta, deber medir las porciones para poder calcular la cantidad de carbohidratos. Una porcin de la mayora de los alimentos ricos en carbohidratos contiene alrededor de 15g de carbohidratos. La siguiente [employer] tamaos de porcin de los alimentos ricos en carbohidratos que contienen alrededor de 15g de carbohidratos por porcin:   1rebanada de pan (1oz) o 1tortilla de seis pulgadas.  panecillo de hamburguesa o bollito tipo ingls.  4a 6galletas.   de taza de cereal sin azcar y seco.   taza de cereal caliente.   de taza de arroz o pastas.  taza de pur de papas o de una papa grande al horno.  1taza de frutas frescas o una fruta pequea.  taza de frutas o jugo de frutas enlatados o congelados.  [place].   de taza de yogur descremado sin ningn agregado o de yogur endulzado con edulcorante artificial.  taza de vegetales almidonados, como guisantes, maz o papas, o de frijoles secos cocidos. Decida la cantidad de porciones Gaffer. Multiplique la cantidad de porciones por 15 (los gramos de carbohidratos  en esa porcin). Por ejemplo, si come 2tazas de fresas, habr comido 2porciones y 30g de carbohidratos (2porciones x 15g = 30g). Latty y guisos, en las que se mezcla ms de un alimento, deber [employer] carbohidratos de cada alimento incluido. EJEMPLO DE RECUENTO DE CARBOHIDRATOS Ejemplo de cena  3 onzas de pechugas de pollo.   de taza de arroz integral.   taza de  [place].  1 taza de fresas con crema batida sin azcar. Clculo de [place] 1: Identifique los alimentos que contienen carbohidratos:   Arroz.  Maz.  Leche.  Hughie Closs. [place] 2: Calcule el nmero de porciones que consumir de cada uno:   2 porciones de Occupational psychologist.  1 porcin de maz.  [place].  1 porcin de fresas. [place] 3: Multiplique cada una de esas porciones por 15g:   2 porciones de arroz x 15 g = 30 g.  1 porcin de maz x 15 g = 15 g.  1 porcin de leche x 15 g = 15 g.  1 porcin de fresas x 15 g = 15 g. [place] 4: Sume todas las cantidades para Armed forces logistics/support/administrative officer total de gramos de carbohidratos consumidos: 30 g + 15 g + 15 g + 15 g = 75 g. Document Released: 06/02/2011 Document Revised: 07/25/2013 [facility] Patient Information 2015 [place]. This information is not intended to replace advice given to you by your health care provider. Make sure you discuss any questions you have with your health care provider.

## 2014-08-12 NOTE — Progress Notes (Signed)
   Subjective:    Patient ID: Arthur Howard, male    DOB: [birth year], 49 y.o.   MRN: [id number]  HPI This note was scribed by Benson Setting, CMA for Dr. Lou Miner, MD 49 y.o. Male here for a follow up about a abnormal exam of a prostate when he was in [place] last. Year. He also wants to check to see if he is diabetic. He c/o dizziness. He has no problems with his bowels. He is requesting a PSA and A1C because when he was in [place] they told him his labs were abnormal. He does drink alcohol but not often.  He has a family history of cancer. His grandfather passed away from lung cancer.No fatigue, anorexia, weight loss,polyuria, polydipsia.  Review of Systems     Objective:   Physical Exam  Constitutional: He is oriented to person, place, and time. He appears well-developed and well-nourished. No distress.  HENT:  Head: Normocephalic.  Mouth/Throat: Oropharynx is clear and moist.  Eyes: EOM are normal. No scleral icterus.  Neck: Normal range of motion. Neck supple.  Cardiovascular: Normal rate, regular rhythm and normal heart sounds.   No murmur heard. Pulmonary/Chest: Effort normal and breath sounds normal.  Abdominal: Soft. Bowel sounds are normal. He exhibits no mass. There is no tenderness.  Genitourinary: Prostate normal and penis normal.  Neurological: He is alert and oriented to person, place, and time. He exhibits normal muscle tone. Coordination normal.  Psychiatric: He has a normal mood and affect. His behavior is normal. Judgment and thought content normal.   Results for orders placed or performed during the hospital encounter of 11/02/09  Surgical pcr screen  Result Value Ref Range   MRSA, PCR NEGATIVE NEGATIVE   Staphylococcus aureus  NEGATIVE    NEGATIVE        The Xpert SA Assay (FDA approved for NASAL specimens only), is one component of a comprehensive surveillance program.  It is not intended to diagnose infection nor to guide or monitor treatment.  CBC    Result Value Ref Range   WBC 6.1 4.0 - 10.5 K/uL   RBC 4.99 4.22 - 5.81 MIL/uL   Hemoglobin 15.5 13.0 - 17.0 g/dL   HCT 43.8 39.0 - 52.0 %   MCV 87.8 78.0 - 100.0 fL   MCH 31.1 26.0 - 34.0 pg   MCHC 35.4 30.0 - 36.0 g/dL   RDW 12.5 11.5 - 15.5 %   Platelets 274 150 - 400 K/uL  Type and screen  Result Value Ref Range   ABO/RH(D) O POS    Antibody Screen NEG    Sample Expiration 11/05/2009   ABO/Rh  Result Value Ref Range   ABO/RH(D) O POS           Assessment & Plan:  New diagnosis T2DM Start Metformin 500mg  bid RTC 2 weeks

## 2014-08-15 ENCOUNTER — Encounter: Payer: Self-pay | Admitting: Family Medicine

## 2014-08-15 LAB — PSA: PSA: 0.97 ng/mL (ref <=4.00)

## 2015-11-10 ENCOUNTER — Ambulatory Visit (INDEPENDENT_AMBULATORY_CARE_PROVIDER_SITE_OTHER): Payer: No Typology Code available for payment source | Admitting: Family Medicine

## 2015-11-10 VITALS — BP 134/76 | HR 59 | Temp 97.3°F | Resp 16 | Ht 64.0 in | Wt 155.6 lb

## 2015-11-10 DIAGNOSIS — Z130 Encounter for screening for diseases of the blood and blood-forming organs and certain disorders involving the immune mechanism: Secondary | ICD-10-CM | POA: Diagnosis not present

## 2015-11-10 DIAGNOSIS — Z125 Encounter for screening for malignant neoplasm of prostate: Secondary | ICD-10-CM | POA: Diagnosis not present

## 2015-11-10 DIAGNOSIS — M791 Myalgia: Secondary | ICD-10-CM

## 2015-11-10 DIAGNOSIS — Z1212 Encounter for screening for malignant neoplasm of rectum: Secondary | ICD-10-CM

## 2015-11-10 DIAGNOSIS — Z0000 Encounter for general adult medical examination without abnormal findings: Secondary | ICD-10-CM

## 2015-11-10 DIAGNOSIS — Z1383 Encounter for screening for respiratory disorder NEC: Secondary | ICD-10-CM | POA: Diagnosis not present

## 2015-11-10 DIAGNOSIS — M609 Myositis, unspecified: Secondary | ICD-10-CM

## 2015-11-10 DIAGNOSIS — Z1211 Encounter for screening for malignant neoplasm of colon: Secondary | ICD-10-CM

## 2015-11-10 DIAGNOSIS — Z1329 Encounter for screening for other suspected endocrine disorder: Secondary | ICD-10-CM

## 2015-11-10 DIAGNOSIS — Z23 Encounter for immunization: Secondary | ICD-10-CM

## 2015-11-10 DIAGNOSIS — Z1389 Encounter for screening for other disorder: Secondary | ICD-10-CM

## 2015-11-10 DIAGNOSIS — E785 Hyperlipidemia, unspecified: Secondary | ICD-10-CM | POA: Diagnosis not present

## 2015-11-10 DIAGNOSIS — Z113 Encounter for screening for infections with a predominantly sexual mode of transmission: Secondary | ICD-10-CM | POA: Diagnosis not present

## 2015-11-10 DIAGNOSIS — E1165 Type 2 diabetes mellitus with hyperglycemia: Secondary | ICD-10-CM | POA: Diagnosis not present

## 2015-11-10 DIAGNOSIS — Z136 Encounter for screening for cardiovascular disorders: Secondary | ICD-10-CM

## 2015-11-10 DIAGNOSIS — IMO0001 Reserved for inherently not codable concepts without codable children: Secondary | ICD-10-CM

## 2015-11-10 LAB — LIPID PANEL
CHOL/HDL RATIO: 5.1 ratio — AB (ref <=5.0)
Cholesterol: 266 mg/dL — ABNORMAL HIGH (ref 125–200)
HDL: 52 mg/dL (ref >=40)
LDL Cholesterol: 177 mg/dL — ABNORMAL HIGH (ref <130)
Triglycerides: 185 mg/dL — ABNORMAL HIGH (ref <150)
VLDL: 37 mg/dL — AB (ref <30)

## 2015-11-10 LAB — POCT URINALYSIS DIP (MANUAL ENTRY)
Bilirubin, UA: NEGATIVE
Blood, UA: NEGATIVE
Glucose, UA: >=1000 — AB
LEUKOCYTES UA: NEGATIVE
Nitrite, UA: NEGATIVE
PH UA: 5.5
PROTEIN UA: NEGATIVE
SPEC GRAV UA: 1.025
Urobilinogen, UA: 0.2

## 2015-11-10 LAB — TSH: TSH: 0.91 mIU/L (ref 0.40–4.50)

## 2015-11-10 LAB — COMPREHENSIVE METABOLIC PANEL
ALT: 15 U/L (ref 9–46)
AST: 19 U/L (ref 10–35)
Albumin: 4.2 g/dL (ref 3.6–5.1)
Alkaline Phosphatase: 64 U/L (ref 40–115)
BUN: 18 mg/dL (ref 7–25)
CHLORIDE: 101 mmol/L (ref 98–110)
CO2: 27 mmol/L (ref 20–31)
Calcium: 9 mg/dL (ref 8.6–10.3)
Creat: 0.88 mg/dL (ref 0.70–1.33)
GLUCOSE: 290 mg/dL — AB (ref 65–99)
POTASSIUM: 4.8 mmol/L (ref 3.5–5.3)
Sodium: 136 mmol/L (ref 135–146)
Total Bilirubin: 0.9 mg/dL (ref 0.2–1.2)
Total Protein: 6.7 g/dL (ref 6.1–8.1)

## 2015-11-10 LAB — CBC
HCT: 46.1 % (ref 38.5–50.0)
Hemoglobin: 15.8 g/dL (ref 13.2–17.1)
MCH: 31.1 pg (ref 27.0–33.0)
MCHC: 34.3 g/dL (ref 32.0–36.0)
MCV: 90.7 fL (ref 80.0–100.0)
MPV: 9.4 fL (ref 7.5–12.5)
PLATELETS: 340 10*3/uL (ref 140–400)
RBC: 5.08 MIL/uL (ref 4.20–5.80)
RDW: 12.6 % (ref 11.0–15.0)
WBC: 5.9 10*3/uL (ref 3.8–10.8)

## 2015-11-10 LAB — MICROALBUMIN / CREATININE URINE RATIO
CREATININE, URINE: 274 mg/dL (ref 20–370)
Microalb Creat Ratio: 2 mcg/mg creat (ref <30)
Microalb, Ur: 0.6 mg/dL

## 2015-11-10 LAB — CK: Total CK: 330 U/L — ABNORMAL HIGH (ref 7–232)

## 2015-11-10 LAB — PSA: PSA: 0.4 ng/mL (ref <=4.0)

## 2015-11-10 LAB — HIV ANTIBODY (ROUTINE TESTING W REFLEX): HIV 1&2 Ab, 4th Generation: NONREACTIVE

## 2015-11-10 LAB — POCT GLYCOSYLATED HEMOGLOBIN (HGB A1C): HEMOGLOBIN A1C: 10.7

## 2015-11-10 MED ORDER — METFORMIN HCL 1000 MG PO TABS: 1000.0000 mg | ORAL_TABLET | Freq: Two times a day (BID) | ORAL | 3 refills | Status: DC

## 2015-11-10 MED ORDER — BLOOD GLUCOSE MONITOR KIT: PACK | 0 refills | Status: DC

## 2015-11-10 NOTE — Patient Instructions (Signed)
Control del nivel de glucosa en la sangre - Adultos (Blood Glucose Monitoring, Adult) El control de la glucosa en la sangre (tambin llamada azcar en la sangre) lo ayudar a tener la diabetes bajo control. Tambin ayuda a que usted y el mdico controlen la diabetes y determinen si el tratamiento es eficaz. POR QU HAY QUE CONTROLAR LA GLUCOSA EN LA SANGRE?  Esto puede ayudar a comprender de qu manera los alimentos, la actividad fsica y los medicamentos inciden en los niveles de glucosa.  Le permite conocer el nivel de glucosa en la sangre en cualquier momento dado. Puede saber rpidamente si el nivel es bajo (hipoglucemia) o alto (hiperglucemia).  Puede ser de ayuda para que usted y el mdico sepan cmo ajustar los medicamentos,  y para entender cmo controlar una enfermedad o ajustar los medicamentos para hacer ejercicio. CUNDO DEBE HACERSE LAS PRUEBAS? El mdico lo ayudar a decidir con qu frecuencia deber controlar los niveles de glucosa en la sangre. Esto puede depender del tipo de diabetes que tenga, su control de la diabetes o los tipos de medicamentos que tome. Asegrese de anotar todos los valores de la glucosa en la sangre, de modo que esta informacin pueda ser revisada por su mdico. A continuacin puede ver ejemplos de los momentos para realizar la prueba que el mdico puede sugerir. Diabetes tipo1  Mdaselo al menos 2 veces al da si la diabetes est bien controlada, si usa una bomba de insulina o si se aplica muchas inyecciones diarias.  Si la diabetes no est bien controlada o si est enfermo, puede ser necesario que se controle con ms frecuencia.  Es recomendable que tambin lo mida en estas oportunidades:  Antes de cada inyeccin de insulina.  Antes y despus de hacer ejercicio.  Entre las comidas y 2horas despus de comer.  Ocasionalmente, entre las 2:00a.m. y las 3:00a.m. Diabetes tipo2  Si est utilizando insulina, realice la medicin al menos 2  veces al da. Sin embargo, es mejor hacer una medicin antes de cada inyeccin de insulina.  Si toma medicamentos por boca (va oral), hgase la prueba 2veces por da.  Si sigue una dieta controlada, hgase la prueba una vez por da.  Si la diabetes no est bien controlada o si est enfermo, puede ser necesario que se controle con ms frecuencia. CMO CONTROLAR EL NIVEL DE GLUCOSA EN LA SANGRE Insumos necesarios  Medidor de glucosa en la sangre.  Tiras reactivas para el medidor. Cada medidor tiene sus propias tiras reactivas. Debe usar las tiras reactivas correspondientes a su medidor.  Una aguja para pinchar (lanceta).  Un dispositivo que sujeta la lanceta (dispositivo de puncin).  Un diario o libro de anotaciones para anotar los resultados. Procedimiento  Lave sus manos con agua y jabn. No se recomienda usar alcohol.  Pnchese el costado del dedo (no la punta) con la lanceta.  Apriete suavemente el dedo hasta que aparezca una pequea gota de sangre.  Siga las instrucciones que vienen con el medidor para insertar la tira reactiva, aplicar la sangre sobre la tira y usar el medidor de glucosa en la sangre. Otras zonas de las que se puede tomar sangre para la prueba Algunos medidores le permiten tomar sangre para la prueba de otras zonas del cuerpo (que no son el dedo). Estas reas se llaman sitios alternativos. Los sitios alternativos ms comunes son los siguientes:  El antebrazo.  El muslo.  La zona posterior de la parte inferior de la pierna.  La palma de la   mano. El flujo de sangre en estas zonas es ms lento. Por lo tanto, los valores de glucosa en la sangre que obtenga pueden estar demorados, y los nmeros son diferentes de los que obtiene de los dedos. No saque sangre de sitios alternativos si cree que tiene hipoglucemia. Los valores no sern precisos. Siempre extraiga del dedo si tiene hipoglucemia. Adems, si no puede darse cuenta cuando tiene bajos los niveles  (hipoglucemia asintomtica), siempre extraiga sangre de los dedos para los controles de glucosa en la [place]. CONSEJOS ADICIONALES PARA EL CONTROL DE LA GLUCOSA  No vuelva a [place] lancetas.  Siempre tenga los insumos a mano.  Todos los medidores de glucosa incluyen un nmero de telfono "directo", disponible las 24 horas, al que podr llamar si tiene preguntas o [place].  Ajuste (calibre) el medidor de glucosa con una solucin de control despus de terminar algunas cajas de tiras reactivas. LLEVE REGISTROS DE LOS NIVELES DE GLUCOSA EN LA SANGRE Es recomendable llevar un diario o un registro de los valores de glucosa en la [employer]. La [employer] de los medidores de glucosa, sino todos, conservan el registro de la glucosa en el dispositivo. Algunos medidores permiten descargar los registros a su computadora. Llevar un registro de los valores de glucosa en la sangre es especialmente til si desea observar los patrones. Haga anotaciones simultneas con la Teacher, English as a foreign language de los valores de glucosa en la sangre debido a que podra olvidar lo que ocurri en el momento exacto. Llevar un buen registro los ayudar a usted y al mdico a Fish farm manager juntos para Scientist, forensic un buen control de la diabetes.    Esta informacin no tiene Marine scientist el consejo del mdico. Asegrese de hacerle al mdico cualquier pregunta que tenga.   Document Released: 03/10/2005 Document Revised: 03/31/2014 Elsevier Interactive Patient Education 2016 [place] diabetes mellitus y los alimentos (Diabetes Mellitus and Food) Es importante que controle su nivel de azcar en la sangre (glucosa). El nivel de glucosa en sangre depende en gran medida de lo que usted come. [place] alimentos saludables en las cantidades [place] a lo largo del Training and development officer, aproximadamente a la misma hora [employer], lo ayudar a Chief Technology Officer su nivel de Multimedia programmer. Tambin puede ayudarlo a retrasar o Patent attorney de la diabetes  mellitus. [place] de [employer] saludable incluso puede ayudarlo a Chartered loss adjuster de presin arterial y a Science writer o Theatre manager un peso saludable.  Entre las recomendaciones generales para alimentarse y Audiological scientist los alimentos de forma saludable, se incluyen las siguientes:  Respetar las comidas principales y comer colaciones con regularidad. Evitar pasar largos perodos sin comer con el fin de perder peso.  Seguir una dieta que consista principalmente en alimentos de origen vegetal, como frutas, vegetales, frutos secos, legumbres y cereales integrales.  Utilizar mtodos de coccin a baja temperatura, como hornear, en lugar de mtodos de coccin a alta temperatura, como frer en abundante aceite. Trabaje con el nutricionista para aprender a Financial planner nutricional de las etiquetas de los alimentos. CMO PUEDEN AFECTARME LOS ALIMENTOS? Carbohidratos Los carbohidratos afectan el nivel de glucosa en sangre ms que cualquier otro tipo de alimento. El nutricionista lo ayudar a Teacher, adult education cuntos carbohidratos puede consumir en cada comida y ensearle a contarlos. El recuento de carbohidratos es importante para mantener la glucosa en sangre en un nivel saludable, en especial si utiliza insulina o toma determinados medicamentos para la diabetes mellitus. Alcohol El alcohol puede provocar disminuciones sbitas de la glucosa en  sangre (hipoglucemia), en especial si utiliza insulina o toma determinados medicamentos para la diabetes mellitus. La hipoglucemia es una afeccin que puede poner en peligro la vida. Los sntomas de la hipoglucemia (somnolencia, mareos y Data processing manager) son similares a los sntomas de haber consumido mucho alcohol.  Si el mdico lo autoriza a beber alcohol, hgalo con moderacin y siga estas pautas:  Las mujeres no deben beber ms de un trago por da, y los hombres no deben beber ms de dos tragos por Training and development officer. Un trago es igual a:  12 onzas (355 ml) de cerveza  5 onzas de vino (150  ml) de vino  1,5onzas (104ml) de bebidas espirituosas  No beba con el estmago vaco.  Mantngase hidratado. Beba agua, gaseosas dietticas o t helado sin azcar.  Las gaseosas comunes, los jugos y otros refrescos podran contener muchos carbohidratos y se Civil Service fast streamer. QU ALIMENTOS NO SE RECOMIENDAN? Cuando haga las elecciones de alimentos, es importante que recuerde que todos los alimentos son distintos. Algunos tienen menos nutrientes que otros por porcin, aunque podran tener la misma cantidad de caloras o carbohidratos. Es difcil darle al cuerpo lo que necesita cuando consume alimentos con menos nutrientes. Estos son algunos ejemplos de alimentos que debera evitar ya que contienen muchas caloras y carbohidratos, pero pocos nutrientes:  Physicist, medical trans (la mayora de los alimentos procesados incluyen grasas trans en la etiqueta de Informacin nutricional).  Gaseosas comunes.  Jugos.  Caramelos.  Dulces, como tortas, pasteles, rosquillas y [place].  Comidas fritas. QU ALIMENTOS PUEDO COMER? Consuma alimentos ricos en nutrientes, que nutrirn el cuerpo y lo mantendrn saludable. Los alimentos que debe comer tambin dependern de varios factores, como:  Las caloras que necesita.  Los medicamentos que toma.  Su peso.  El nivel de glucosa en [place].  El [place] de presin arterial.  El nivel de colesterol. Debe consumir una amplia variedad de alimentos, por ejemplo:  Protenas.  Cortes de [employer].  Protenas con bajo contenido de grasas saturadas, como pescado, clara de huevo y frijoles. Evite las carnes procesadas.  Frutas y vegetales.  Frutas y Photographer que pueden ayudar a Chief Technology Officer los niveles sanguneos de [place], como [place], mangos y batatas.  Productos lcteos.  Elija productos lcteos sin grasa o con bajo contenido de [place], como [place], yogur y [place].  Cereales, panes, pastas y arroz.  Elija cereales integrales, como panes multicereales,  avena en grano y arroz integral. Estos alimentos pueden ayudar a controlar la presin arterial.  Daphene Jaeger.  Alimentos que contengan grasas saludables, como frutos secos, Musician, aceite de [place], aceite de canola y pescado. TODOS LOS QUE PADECEN DIABETES MELLITUS TIENEN EL [place] PLAN DE [place]? Dado que todas las personas que padecen diabetes mellitus son distintas, no hay un solo plan de comidas que funcione para todos. Es muy importante que se rena con un nutricionista que lo ayudar a crear un plan de comidas adecuado para usted.   Esta informacin no tiene Marine scientist el consejo del mdico. Asegrese de hacerle al mdico cualquier pregunta que tenga.   Document Released: 06/17/2007 Document Revised: 03/31/2014 Elsevier Interactive Patient Education 2016 [employer].  Diabetes mellitus tipo2, adultos (Type 2 Diabetes Mellitus, Adult) La diabetes mellitus tipo2, generalmente denominada diabetes tipo2, es una enfermedad prolongada (crnica). En la diabetes tipo2, el pncreas no produce suficiente insulina (una hormona), las clulas son menos sensibles a la insulina que se produce (resistencia a la insulina), o ambos. Normalmente, la [employer] azcares de los alimentos a las clulas  de los tejidos. Las clulas de los tejidos [employer] azcares para Dealer. La falta de insulina o la falta de una respuesta normal a la insulina hace que el exceso de azcar se acumule en la sangre en lugar de Location manager en las clulas de los tejidos. Como resultado, se producen niveles altos de Dispensing optician (hiperglucemia). El efecto de los niveles altos de azcar (glucosa) puede causar muchas complicaciones.  La diabetes tipo2 antes tambin se denominaba diabetes del [place], pero puede ocurrir a Hotel manager.  [place] persona tiene mayor predisposicin a desarrollar diabetes tipo 2 si alguien en su familia tiene la enfermedad y tambin tiene uno o ms de los  siguientes factores de riesgo principales:  Aumento de [place], sobrepeso u obesidad.  Estilo de vida sedentario.  Una historia de consumo constante de alimentos de altas caloras. Mantener un peso saludable y realizar actividad fsica regular puede reducir la probabilidad de desarrollar diabetes tipo 2. SNTOMAS  Una persona con diabetes tipo 2 no presenta sntomas en un principio. Los sntomas de la diabetes tipo 2 aparecen lentamente. Los sntomas son:  Aumento de la sed (polidipsia).  Aumento de la miccin (poliuria).  Orina con ms frecuencia durante la noche (nocturia).  Cambios repentinos en el peso o sin motivo aparente.  Infecciones frecuentes y recurrentes.  Cansancio (fatiga).  Debilidad.  Cambios en la visin, como visin borrosa.  Olor a Medical illustrator.  Dolor abdominal.  Nuseas o vmitos.  Cortes o moretones que tardan en sanarse.  Hormigueo o adormecimiento de las manos y los pies.  Una herida abierta en la piel (lcera). DIAGNSTICO Con frecuencia la diabetes tipo 2 no se diagnostica hasta que se presentan las complicaciones de la diabetes. La diabetes tipo 2 se diagnostica cuando los sntomas o las complicaciones se presentan y cuando aumentan los niveles de glucosa en la [place]. El nivel de glucosa en la sangre puede controlarse en uno o ms de los siguientes anlisis de sangre:  Medicin de glucosa en la sangre en [place]. No se le permitir comer durante al menos 8 horas antes de que se tome [place] de [place].  Pruebas al azar de glucosa en la sangre. El nivel de glucosa en la sangre se controla en cualquier momento del da sin importar el momento en que haya comido.  Prueba de A1c (hemoglobina glucosilada) Una prueba de A1c proporciona informacin sobre el control de la glucosa en la sangre durante los ltimos 3 meses.  Prueba de tolerancia a la glucosa oral (PTGO). La glucosa en la sangre se mide despus de no haber comido (ayunas) durante  dos horas y despus de beber una bebida que contenga glucosa. TRATAMIENTO   Usted puede necesitar administrarse insulina o medicamentos para la diabetes todos los das para [employer] niveles de glucosa en la sangre en el rango deseado.  Si [place] insulina, tal vez necesite ajustar la dosis segn los carbohidratos que haya consumido en cada comida o colacin.  [employer] del tratamiento, se recomienda hacer cambios en el estilo de vida. Estos pueden incluir lo siguiente:  Teaching laboratory technician de alimentacin personalizado elaborado por un nutricionista.  Hacer ejercicio fsico a diario. El mdico establecer los objetivos personalizados del tratamiento para usted en funcin de su edad, los medicamentos que toma, el tiempo que hace que tiene diabetes y cualquier otra enfermedad que padezca. Generalmente, el objetivo del tratamiento es [employer] siguientes niveles sanguneos de glucosa:  Antes de  las comidas (preprandial): de 80 a 130 mg/dl.  Antes de las comidas (preprandial): de 80 a 130 mg/dl.  A1c: menos del 6,5 % al 7 %. INSTRUCCIONES PARA EL CUIDADO EN EL HOGAR   Controle su nivel de hemoglobina A1c dos veces al ao.  Contrlese a diario Retail buyer de glucosa en la sangre segn las indicaciones de su mdico.  Supervise las cetonas en la orina cuando est enferma y segn las indicaciones de su [place] medicamento para la diabetes o adminstrese insulina segn las indicaciones de su mdico para Contractor nivel de glucosa en la sangre en el rango deseado.  Nunca se quede sin medicamento para la diabetes o sin insulina. Es necesario que la reciba [employer].  Si [place] insulina, tal vez deba ajustar la cantidad de insulina administrada segn los carbohidratos consumidos. Los hidratos de carbono pueden aumentar los niveles de glucosa en la sangre, pero deben incluirse en su dieta. Los hidratos de carbono aportan vitaminas, minerales y [place] que son Ardelia Mems parte esencial de una dieta  saludable. Los hidratos de carbono se encuentran en frutas, verduras, cereales integrales, productos lcteos, legumbres y alimentos que contienen azcares aadidos.  Consuma alimentos saludables. Programe una cita con un nutricionista certificado para que lo ayude a Building services engineer de alimentacin adecuado para usted.  Baje de peso si es necesario.  Lleve una tarjeta de alerta mdica o use una pulsera o medalla de alerta mdica.  Lleve con usted una colacin de 15gramos de hidratos de carbono en todo momento para controlar los niveles bajos de glucosa en la sangre (hipoglucemia). Algunos ejemplos de colaciones de 15gramos de hidratos de carbono son los siguientes:  Tabletas de glucosa, 3 o 4.  Gel de glucosa, tubo de 15 gramos.  Pasas de uva, 2 cucharadas (24 gramos).  Caramelos de goma, 6.  Galletas de [place], 8.  Gaseosa comn, 4onzas (146mililitros).  Pastillas de goma, 9.  Reconocer la hipoglucemia. La hipoglucemia se produce cuando los niveles de glucosa en la sangre son de 70 mg/dl o menos. El riesgo de hipoglucemia aumenta durante el ayuno o cuando se saltea las comidas, durante o despus de Optometrist ejercicio intenso y [place] duerme. Los sntomas de hipoglucemia son:  Temblores o sacudidas.  Disminucin de la capacidad de concentracin.  Sudoracin.  Aumento de la frecuencia cardaca.  Dolor de [place].  Sequedad en la boca.  Hambre.  Irritabilidad.  Ansiedad.  Sueo agitado.  Alteracin del habla o de la coordinacin.  Confusin.  Tratar la hipoglucemia rpidamente. Si usted est alerta y puede tragar con seguridad, siga la regla de 15/15 que consiste en:  [employer] 15 y 20gramos de glucosa de accin rpida o carbohidratos. Las opciones de accin rpida son un gel de glucosa, tabletas de glucosa, o 4 onzas (120 ml) de jugo de frutas, gaseosa comn, o leche baja en grasa.  Compruebe su nivel de glucosa en la sangre 15 minutos despus de tomar  la glucosa.  Tome entre 15 y 68 gramos ms de glucosa si el nivel de glucosa en la sangre todava es de 70mg /dl o inferior.  Ingiera una comida o una colacin en el lapso de 1 hora una vez que los niveles de glucosa en la sangre vuelven a la normalidad.  Est atento a si siente mucha sed u orina con mayor frecuencia, porque son signos tempranos de hiperglucemia. El reconocimiento temprano de la hiperglucemia permite un tratamiento oportuno. Trate la hiperglucemia segn le indic su  mdico.  Haga, al menos, 151minutos de actividad fsica moderada a la semana, distribuidos en, por lo menos, 3das a la semana o como lo indique su mdico. Adems, debe realizar ejercicios de resistencia por lo menos 2veces a la semana o como lo indique su mdico. Trate de no permanecer inactivo durante ms de 71minutos seguidos.  Ajuste su medicamento y la ingesta de alimentos, segn sea necesario, si inicia un nuevo ejercicio o deporte.  Siga su plan para los das de enfermedad cuando no puede comer o beber como de [place].  No consuma ningn producto que contenga tabaco, como cigarrillos, tabaco de Higher education careers adviser o Psychologist, sport and exercise. Si necesita ayuda para dejar de fumar, hable con el mdico.  Limite el consumo de alcohol a no ms de 1 medida por da en las mujeres no embarazadas y 2 medidas en los hombres. Debe beber alcohol solo mientras come. Hable con su mdico acerca de si el alcohol es seguro para usted. Informe a su mdico si bebe alcohol varias veces a la semana.  Concurra a todas las visitas de control como se lo haya indicado el mdico. Esto es importante.  Programe un examen de la vista poco despus del diagnstico de diabetes tipo 2 y luego anualmente.  Realice diariamente el cuidado de la piel y de los pies. Examine su piel y los pies diariamente para ver si tiene cortes, moretones, enrojecimiento, problemas en las uas, sangrado, ampollas o Pension scheme manager. Su mdico debe hacerle un examen de los pies  una vez por ao.  Cepllese los dientes y encas por lo menos dos veces al da y use hilo dental al menos una vez por da. Concurra regularmente a las visitas de control con el dentista.  Comparta su plan de control de diabetes en el trabajo o en la escuela.  [place]. Se recomienda que se vacune contra la gripe todos los [place]. Adems, que se vacune contra la neumona (vacuna antineumoccica). Si es mayor de 59 aos y nunca se Control and instrumentation engineer la neumona, esta vacuna puede administrarse como una serie de dos vacunas por separado. Pregntele al mdico qu otras vacunas se pueden recomendar.  Aprenda a Engineer, maintenance (IT).  Obtenga la mayor cantidad posible de informacin sobre la diabetes y solicite ayuda siempre que sea necesario.  Busque programas de rehabilitacin y participe en ellos para mantener o mejorar su independencia y su calidad de vida. Solicite la derivacin a fisioterapia o terapia ocupacional si se le [employer] o la mano, o tiene problemas para asearse, vestirse, comer, o durante la [place] fsica. SOLICITE ATENCIN MDICA SI:   No puede comer alimentos o beber por ms de 6 horas.  Tuvo nuseas o ha vomitado durante ms de 6 horas.  Su nivel de glucosa en la sangre es mayor de 240 mg/dl.  Presenta algn cambio en el estado mental.  Desarrolla una enfermedad grave adicional.  Tuvo diarrea durante ms de 6 horas.  Ha estado enfermo o ha tenido fiebre durante un par de das y no mejora.  Siente dolor al practicar cualquier actividad fsica. SOLICITE ATENCIN MDICA DE INMEDIATO SI:  Tiene dificultad para respirar.  Tiene niveles de cetonas moderados a altos.   Esta informacin no tiene Marine scientist el consejo del mdico. Asegrese de hacerle al mdico cualquier pregunta que tenga.   Document Released: 03/10/2005 Document Revised: 11/29/2014 Elsevier Interactive Patient Education [employer].

## 2015-11-10 NOTE — Progress Notes (Signed)
10/7

## 2015-11-10 NOTE — Progress Notes (Addendum)
Subjective:    Patient ID: Arthur Howard, male    DOB: July 22, 1965, 50 y.o.   MRN: [id number] By signing my name below, I, Judithe Modest, attest that this documentation has been prepared under the direction and in the presence of Delman Cheadle, MD. Electronically Signed: Judithe Modest, ER Scribe. 11/10/2015. 8:46 AM. Chief Complaint  Patient presents with  . Annual Exam    HPI HPI Comments: Arthur Howard is a 50 y.o. male who presents to [facility] reporting for a CPE. Hx of type II DM, abnormal prostate exam. No prior CPE done at our office. Was last seen over a year ago when he was newly diagnosed with type II DM with A1C 8.6, started on metformin 500 BID. He ran out of his metformin three months ago. He states he has been having muscle cramping. His appetite has been normal. He denies persistent thirstiness. He denies numbness or tingling in legs, CP, SOB. Metformin did not cause nausea or diarrhea side effects. He does not have a glucometer at home. His last TDAP was on 10/10/15. He has not had a pneumonia vaccine.   Immunization History  Administered Date(s) Administered  . Pneumococcal Polysaccharide-23 11/10/2015  . Tdap 09/22/2014    Past Surgical History:  Procedure Laterality Date  . ANKLE SURGERY     Family History  Problem Relation Age of Onset  . Diabetes Sister   . Diabetes Brother     No past medical history on file.  No Known Allergies  No current outpatient prescriptions on file prior to visit.   No current facility-administered medications on file prior to visit.    Depression screen PHQ 2/9 11/10/2015  Decreased Interest 0  Down, Depressed, Hopeless 0  PHQ - 2 Score 0    Review of Systems  Constitutional: Negative for chills and fever.  Eyes: Positive for redness and itching.  Gastrointestinal: Negative for constipation and nausea.  Musculoskeletal: Positive for myalgias.  Neurological: Positive for dizziness and headaches.  All other systems reviewed  and are negative.     Objective:  Physical Exam  Constitutional: He is oriented to person, place, and time. He appears well-developed and well-nourished. No distress.  HENT:  Head: Normocephalic and atraumatic.  Right Ear: Tympanic membrane, external ear and ear canal normal.  Left Ear: Tympanic membrane, external ear and ear canal normal.  Nose: Nose normal.  Mouth/Throat: Uvula is midline, oropharynx is clear and moist and mucous membranes are normal. No oropharyngeal exudate.  Eyes: Conjunctivae are normal. Pupils are equal, round, and reactive to light. Right eye exhibits no discharge. Left eye exhibits no discharge. No scleral icterus.  Neck: Normal range of motion. Neck supple. No thyromegaly present.  Cardiovascular: Normal rate, regular rhythm, normal heart sounds and intact distal pulses.   No murmur heard. Pulmonary/Chest: Effort normal and breath sounds normal. No respiratory distress. He has no wheezes.  Abdominal: Soft. Bowel sounds are normal. He exhibits no distension and no mass. There is no tenderness. There is no rebound and no guarding.  Genitourinary:  Genitourinary Comments: Enlarged prostate with increased right lobe.   Musculoskeletal: Normal range of motion. He exhibits no edema.  Lymphadenopathy:    He has no cervical adenopathy.  Neurological: He is alert and oriented to person, place, and time. He has normal reflexes. No cranial nerve deficit. He exhibits normal muscle tone. Coordination normal.  Skin: Skin is warm and dry. No rash noted. He is not diaphoretic. No erythema.  Psychiatric: He has  a normal mood and affect. His behavior is normal.  Nursing note and vitals reviewed.  BP 134/76 (BP Location: Right Arm, Patient Position: Sitting, Cuff Size: Normal)   Pulse (!) 59   Temp 97.3 F (36.3 C) (Oral)   Resp 16   Ht '5\' 4"'  (1.626 m)   Wt 155 lb 9.6 oz (70.6 kg)   SpO2 97%   BMI 26.71 kg/m   Results for orders placed or performed in visit on 11/10/15    TSH  Result Value Ref Range   TSH 0.91 0.40 - 4.50 mIU/L  CBC  Result Value Ref Range   WBC 5.9 3.8 - 10.8 K/uL   RBC 5.08 4.20 - 5.80 MIL/uL   Hemoglobin 15.8 13.2 - 17.1 g/dL   HCT 46.1 38.5 - 50.0 %   MCV 90.7 80.0 - 100.0 fL   MCH 31.1 27.0 - 33.0 pg   MCHC 34.3 32.0 - 36.0 g/dL   RDW 12.6 11.0 - 15.0 %   Platelets 340 140 - 400 K/uL   MPV 9.4 7.5 - 12.5 fL  Comprehensive metabolic panel  Result Value Ref Range   Sodium 136 135 - 146 mmol/L   Potassium 4.8 3.5 - 5.3 mmol/L   Chloride 101 98 - 110 mmol/L   CO2 27 20 - 31 mmol/L   Glucose, Bld 290 (H) 65 - 99 mg/dL   BUN 18 7 - 25 mg/dL   Creat 0.88 0.70 - 1.33 mg/dL   Total Bilirubin 0.9 0.2 - 1.2 mg/dL   Alkaline Phosphatase 64 40 - 115 U/L   AST 19 10 - 35 U/L   ALT 15 9 - 46 U/L   Total Protein 6.7 6.1 - 8.1 g/dL   Albumin 4.2 3.6 - 5.1 g/dL   Calcium 9.0 8.6 - 10.3 mg/dL  Lipid panel  Result Value Ref Range   Cholesterol 266 (H) 125 - 200 mg/dL   Triglycerides 185 (H) <150 mg/dL   HDL 52 >=40 mg/dL   Total CHOL/HDL Ratio 5.1 (H) <=5.0 Ratio   VLDL 37 (H) <30 mg/dL   LDL Cholesterol 177 (H) <130 mg/dL  Hepatitis C Antibody  Result Value Ref Range   HCV Ab  NEGATIVE  PSA  Result Value Ref Range   PSA 0.4 <=4.0 ng/mL  CK  Result Value Ref Range   Total CK 330 (H) 7 - 232 U/L  HIV antibody  Result Value Ref Range   HIV 1&2 Ab, 4th Generation  NONREACTIVE  POCT urinalysis dipstick  Result Value Ref Range   Color, UA yellow yellow   Clarity, UA clear clear   Glucose, UA >=1,000 (A) negative   Bilirubin, UA negative negative   Ketones, POC UA trace (5) (A) negative   Spec Grav, UA 1.025    Blood, UA negative negative   pH, UA 5.5    Protein Ur, POC negative negative   Urobilinogen, UA 0.2    Nitrite, UA Negative Negative   Leukocytes, UA Negative Negative  POCT glycosylated hemoglobin (Hb A1C)  Result Value Ref Range   Hemoglobin A1C 10.7    EKG read by Dr. Brigitte Pulse: sinus bradycardia with early  repole, flipped t-waves in lead 3.     Assessment & Plan:   1. Annual physical exam   2. Screening for cardiovascular, respiratory, and genitourinary diseases   3. Screening for colorectal cancer - pt agrees to referral for colonoscopy  4. Screening for deficiency anemia   5. Screening for prostate cancer  6. Screening for thyroid disorder   7. Routine screening for STI (sexually transmitted infection)   8. Type 2 diabetes mellitus with hyperglycemia, without long-term current use of insulin ([place]) - reviewed need for increased compliance and importance of treatment - pt and his daughter both very agreeable and will f/u. Start metformin 1042m bid. Refer for DM eye exam. Rx glucometer - check 2 hrs post-prandial at times to give feedback on diet. Recheck in 3 mos.  9. Myalgia and myositis - suspect due to electrolyte abnormalities from hyperpglycemia - will hopefully resolve as DM are treated, labs today   10.   HPL - start pravastatin  Orders Placed This Encounter  Procedures  . Pneumococcal polysaccharide vaccine 23-valent greater than or equal to 2yo subcutaneous/IM  . TSH  . CBC  . Comprehensive metabolic panel    Order Specific Question:   Has the patient fasted?    Answer:   Yes  . Lipid panel    Order Specific Question:   Has the patient fasted?    Answer:   Yes  . Hepatitis C Antibody  . PSA  . CK  . HIV antibody  . Microalbumin/Creatinine Ratio, Urine  . Ambulatory referral to Ophthalmology    Referral Priority:   Routine    Referral Type:   Consultation    Referral Reason:   Specialty Services Required    Requested Specialty:   Ophthalmology    Number of Visits Requested:   1  . Ambulatory referral to Gastroenterology    Referral Priority:   Routine    Referral Type:   Consultation    Referral Reason:   Specialty Services Required    Number of Visits Requested:   1  . POCT urinalysis dipstick  . POCT glycosylated hemoglobin (Hb A1C)  . EKG 12-Lead  . HM  DIABETES FOOT EXAM    Meds ordered this encounter  Medications  . metFORMIN (GLUCOPHAGE) 1000 MG tablet    Sig: Take 1 tablet (1,000 mg total) by mouth 2 (two) times daily with a meal.    Dispense:  180 tablet    Refill:  3  . blood glucose meter kit and supplies KIT    Sig: Dispense based on patient and insurance preference. Use up to four times daily as directed. (FOR ICD-9 250.00, 250.01).    Dispense:  1 each    Refill:  0    Order Specific Question:   Number of strips    Answer:   1000    Order Specific Question:   Number of lancets    Answer:   1000   Language level caveat. Daughter translates.  I personally performed the services described in this documentation, which was scribed in my presence. The recorded information has been reviewed and considered, and addended by me as needed.   EDelman Cheadle M.D.  [facility] [phone]  11/10/15 3:50 PM

## 2015-11-11 LAB — HEPATITIS C ANTIBODY: HCV AB: NEGATIVE

## 2015-11-13 ENCOUNTER — Encounter: Payer: Self-pay | Admitting: Family Medicine

## 2015-11-13 MED ORDER — PRAVASTATIN SODIUM 40 MG PO TABS: 40.0000 mg | ORAL_TABLET | Freq: Every day | ORAL | 0 refills | Status: DC

## 2015-11-13 NOTE — Addendum Note (Signed)
Addended by: Delman Cheadle on: 11/13/2015 10:01 PM   Modules accepted: Orders

## 2015-11-16 ENCOUNTER — Encounter: Payer: Self-pay | Admitting: *Deleted

## 2016-01-14 ENCOUNTER — Encounter: Payer: Self-pay | Admitting: Family Medicine

## 2016-03-04 ENCOUNTER — Ambulatory Visit (INDEPENDENT_AMBULATORY_CARE_PROVIDER_SITE_OTHER): Payer: No Typology Code available for payment source | Admitting: Family Medicine

## 2016-03-04 VITALS — BP 128/82 | HR 75 | Temp 98.2°F | Resp 17 | Ht 63.0 in | Wt 154.0 lb

## 2016-03-04 DIAGNOSIS — B029 Zoster without complications: Secondary | ICD-10-CM | POA: Diagnosis not present

## 2016-03-04 MED ORDER — VALACYCLOVIR HCL 1 G PO TABS: 1000.0000 mg | ORAL_TABLET | Freq: Three times a day (TID) | ORAL | 0 refills | Status: AC

## 2016-03-04 NOTE — Patient Instructions (Addendum)
IF you received an x-ray today, you will receive an invoice from [facility] Radiology. Please contact [facility] Radiology at [phone] with questions or concerns regarding your invoice.   IF you received labwork today, you will receive an invoice from [employer]. Please contact [facility] at [phone] with questions or concerns regarding your invoice.   Our billing staff will not be able to assist you with questions regarding bills from these companies.  You will be contacted with the lab results as soon as they are available. The fastest way to get your results is to activate your My Chart account. Instructions are located on the last page of this paperwork. If you have not heard from [place] regarding the results in 2 weeks, please contact this office.      Culebrilla (Shingles) La culebrilla, tambin conocida como herpes zster, es una infeccin que causa una erupcin dolorosa en la piel y ampollas llenas de lquido. La culebrilla no est relacionada con el herpes genital, que es una enfermedad de transmisin sexual. Solo se manifiesta en personas que:  Tuvieron varicela.  Recibieron la vacuna contra la varicela. (Esto es poco frecuente). CAUSAS La causa de la culebrilla es el virus de la varicela zster (VVZ), el mismo virus que causa la varicela. Despus de la exposicin al virus de la varicela zster, este permanece en el organismo en un estado inactivo (latente). La culebrilla aparece si el virus se reactiva. Esto puede ocurrir muchos aos despus de la exposicin inicial al virus. No se conocen las causas por las que este virus se reactiva. [place] que tuvieron varicela o recibieron la vacuna contra la varicela estn en riesgo de tener culebrilla. La infeccin es ms frecuente en las personas que:  Tienen ms de 50aos.  Tienen el sistema de defensa del organismo (sistema inmunitario) debilitado, como en los enfermos con  VIH, sida o cncer.  Toman medicamentos que debilitan el sistema inmunitario, como los medicamentos para trasplantes.  Estn sometidas a un gran estrs. SNTOMAS Los primeros sntomas de esta afeccin pueden incluir picazn, hormigueo o dolor en una zona de la piel. Este dolor se puede describir como ardor, punzante o pulstil. Unos das o semanas despus de que comienzan los sntomas, aparece una erupcin rojiza y dolorosa en un lado del cuerpo en un patrn con forma de cinto o de banda. Finalmente, la erupcin se convierte en ampollas llenas de lquido que se abren, forman costras y se secan en dos o tres [place]. En cualquier momento durante la infeccin, puede presentar lo siguiente:  Cristy Hilts.  Escalofros.  Dolor de [place].  Malestar estomacal. DIAGNSTICO Esta afeccin se diagnostica con un examen de la piel. En algunos casos, se extraen muestras de piel o del lquido de las ampollas antes de definir el diagnstico. Estas muestras se examinan con el microscopio o se envan al laboratorio para su anlisis. TRATAMIENTO No hay una cura especfica para esta afeccin. El mdico probablemente le recete medicamentos para ayudarlo a Financial controller, a recuperarse ms rpido y a Customer service manager a Barrister's clerk. Entre los medicamentos se incluyen los siguientes:  Medicamentos antivirales.  Antiinflamatorios.  Analgsicos. Si la zona afectada est en el rostro, podrn derivarlo a un especialista, como un mdico especialista en ojos (oftalmlogo) o en odos, nariz y Investment banker, operational (otorrinolaringlogo) para evitar problemas oculares, dolor crnico o discapacidad. [place] los medicamentos solamente como se lo haya indicado el mdico.  Aplique una crema anestsica o una para calmar la picazn en la zona afectada segn las indicaciones del mdico. Cuidado de la erupcin y las ampollas   Tome un bao de agua fra o aplique compresas fras en  la zona de la erupcin o las ampollas como se lo haya indicado el mdico. Esto aliviar el dolor y Cabin crew.  Mantenga la zona de la erupcin cubierta con una venda (vendaje). Use ropa holgada para ayudar a Best boy del roce con la erupcin.  Mantenga la erupcin y las ampollas limpias con jabn suave y agua fresca o como se lo indique el mdico.  Controle la erupcin todos los das para detectar signos de infeccin. Estos signos incluyen enrojecimiento, hinchazn y dolor que perdura o [place].  No pellizque las ampollas.  No se rasque la zona de la erupcin. Instrucciones generales   Haga reposo segn las indicaciones del mdico.  Concurra a todas las visitas de control como se lo haya indicado el mdico. Esto es importante.  Hasta tanto las ampollas formen costras, la infeccin puede causar varicela en las personas que nunca la tuvieron o no se vacunaron contra la varicela. Para impedir que esto suceda, evite el contacto con [employer], en especial:  Bebs.  Embarazadas.  Nios que Haematologist.  Personas mayores que han recibido un trasplante.  Personas con enfermedades crnicas, como leucemia y sida. SOLICITE ATENCIN MDICA SI:  El dolor no se alivia con los [employer].  El dolor no mejora despus de que la erupcin desaparece.  La erupcin parece infectada. Los signos de infeccin incluyen enrojecimiento, hinchazn y dolor que perdura o [place]. SOLICITE ATENCIN MDICA DE INMEDIATO SI:  La erupcin aparece en el rostro o la nariz.  Tiene dolor en el rostro, en la zona de los ojos o tiene prdida de la sensibilidad en un lado del rostro.  Siente dolor o un zumbido en el odo.  Tiene prdida del gusto.  La afeccin empeora. Esta informacin no tiene Marine scientist el consejo del mdico. Asegrese de hacerle al mdico cualquier pregunta que tenga. Document Released: 12/18/2004 Document Revised: 07/02/2015 Document Reviewed:  01/19/2014 Elsevier Interactive Patient Education  2017 [employer].

## 2016-03-04 NOTE — Progress Notes (Signed)
  Chief Complaint  Patient presents with  . Rash    on stomach of unknown origin     HPI   Pt reports that he has been having a itching and burning rash for the past 6 days  The rash is on the right side from the mid abdomen to the mid axillary line It started off as a blister that drained and lead to more lesion.   No past medical history on file.  Current Outpatient Prescriptions  Medication Sig Dispense Refill  . blood glucose meter kit and supplies KIT Dispense based on patient and insurance preference. Use up to four times daily as directed. (FOR ICD-9 250.00, 250.01). 1 each 0  . metFORMIN (GLUCOPHAGE) 1000 MG tablet Take 1 tablet (1,000 mg total) by mouth 2 (two) times daily with a meal. 180 tablet 3  . pravastatin (PRAVACHOL) 40 MG tablet Take 1 tablet (40 mg total) by mouth daily. 90 tablet 0  . valACYclovir (VALTREX) 1000 MG tablet Take 1 tablet (1,000 mg total) by mouth 3 (three) times daily. 21 tablet 0   No current facility-administered medications for this visit.     Allergies: No Known Allergies  Past Surgical History:  Procedure Laterality Date  . ANKLE SURGERY      Social History   Social History  . Marital status: Married    Spouse name: N/A  . Number of children: N/A  . Years of education: N/A   Social History Main Topics  . Smoking status: Former Research scientist (life sciences)  . Smokeless tobacco: None  . Alcohol use No  . Drug use: No  . Sexual activity: Not Asked   Other Topics Concern  . None   Social History Narrative  . None    ROS  Objective: Vitals:   03/04/16 1633  BP: 128/82  Pulse: 75  Resp: 17  Temp: 98.2 F (36.8 C)  TempSrc: Oral  SpO2: 98%  Weight: 154 lb (69.9 kg)  Height: '5\' 3"'$  (1.6 m)    Physical Exam  Gen: alert and oriented x 3 Resp: normal effort Right abdominal rash that does not cross the midline Palpation of the vesicle lead to rupture of a vesicle with clear fluid drainage  Assessment and Plan Arthur Howard was seen today for  rash.  Diagnoses and all orders for this visit:  Herpes zoster without complication Discussed contact precautions Prescribed valtrex since he has evidence of developing new lesions in the same dermatome Since he is diabetic he should follow up if it is progressing over the next week despite being on valtrex -     valACYclovir (VALTREX) 1000 MG tablet; Take 1 tablet (1,000 mg total) by mouth 3 (three) times daily.     [place]

## 2016-09-18 ENCOUNTER — Encounter: Payer: Self-pay | Admitting: Physician Assistant

## 2016-09-18 ENCOUNTER — Ambulatory Visit (INDEPENDENT_AMBULATORY_CARE_PROVIDER_SITE_OTHER): Payer: No Typology Code available for payment source | Admitting: Physician Assistant

## 2016-09-18 VITALS — BP 111/68 | HR 80 | Temp 98.1°F | Resp 18 | Ht 63.0 in | Wt 154.6 lb

## 2016-09-18 DIAGNOSIS — R21 Rash and other nonspecific skin eruption: Secondary | ICD-10-CM | POA: Diagnosis not present

## 2016-09-18 LAB — POCT SKIN KOH: SKIN KOH, POC: NEGATIVE

## 2016-09-18 MED ORDER — CLOTRIMAZOLE-BETAMETHASONE 1-0.05 % EX CREA: 1.0000 "application " | TOPICAL_CREAM | Freq: Two times a day (BID) | CUTANEOUS | 0 refills | Status: DC

## 2016-09-18 MED ORDER — FLUCONAZOLE 100 MG PO TABS: 200.0000 mg | ORAL_TABLET | ORAL | 0 refills | Status: AC

## 2016-09-18 NOTE — Progress Notes (Signed)
Arthur Howard 

## 2016-09-18 NOTE — Patient Instructions (Addendum)
This infection is consistent with a yeast infection. Yeast like to grow in warm, moist areas. I have given you an oral medication to take once a week for four weeks. You can also use the cream twice daily for the next week. The best thing you can do for yourself is to avoid tight clothes and fabrics that don't "breathe" when you know you'll be in a hot and humid environment. If no improvement in a couple of weeks, or your symptoms worsen, please return for further evaluation.    Intertrigo (Intertrigo) El intertrigo es una irritacin de la piel (inflamacin) que ocurre en las zonas calientes y hmedas del cuerpo. La irritacin puede causar una erupcin cutnea, y que la piel quede en carne viva y con picazn. La erupcin generalmente es rosa o roja. Ocurre con mayor frecuencia entre los pliegues de la piel o donde la piel se roza con piel, como por ejemplo:  Los dedos [employer].  Las [place].  La ingle.  El vientre.  Las [place].  Los glteos. Esta afeccin no se transmite de [place] persona a otra (no es contagiosa). CUIDADOS EN EL HOGAR  Mantenga la zona afectada limpia y seca.  No se rasque la piel.  Permanezca fresco [employer] sea posible. Si es factible, use el aire acondicionado o Programmer, systems.  Aplquese los medicamentos de venta libre y los recetados solamente como se lo haya indicado el mdico.  Si le recetaron un antibitico, tmelo como se lo haya indicado el mdico. No deje de usar el antibitico aunque la afeccin empiece a Teacher, English as a foreign language.  Concurra a todas las visitas de control como se lo haya indicado el mdico. Esto es importante. PREVENCIN  Mantenga un peso saludable.  Mantenga sus pies secos. Esto es muy importante si tiene diabetes. Use calcetines de algodn o lana.  Cuide y proteja la piel de la ingle, y de la zona de los glteos como se lo haya indicado el mdico.  No use ropa ajustada. Use prendas de vestir que: ? Sean sueltas. ? Absorban la humedad del  cuerpo. ? Sean de algodn.  Use un sostn que proporcione buen soporte, si es necesario.  Despus de realizar [place], dchese y squese bien.  Si tiene diabetes, mantenga bajo control el nivel de Dispensing optician. SOLICITE AYUDA SI:  Los sntomas no mejoran con Dispensing optician.  Los sntomas empeoran o se extienden a Airline pilot del cuerpo.  Nota ms enrojecimiento y calor.  Tiene fiebre. Esta informacin no tiene Marine scientist el consejo del mdico. Asegrese de hacerle al mdico cualquier pregunta que tenga. Document Released: 06/25/2010 Document Revised: 07/02/2015 Document Reviewed: 09/11/2014 Elsevier Interactive Patient Education  2018 [employer].     IF you received an x-ray today, you will receive an invoice from [facility] Radiology. Please contact [facility] Radiology at [phone] with questions or concerns regarding your invoice.   IF you received labwork today, you will receive an invoice from [place]. Please contact [facility] at [phone] with questions or concerns regarding your invoice.   Our billing staff will not be able to assist you with questions regarding bills from these companies.  You will be contacted with the lab results as soon as they are available. The fastest way to get your results is to activate your My Chart account. Instructions are located on the last page of this paperwork. If you have not heard from [place] regarding the results in 2 weeks, please contact this office.

## 2016-09-18 NOTE — Progress Notes (Signed)
   Arthur Howard  MRN: [id number] DOB: Jan 28, 1966  Subjective:   Arthur Howard is a 51 y.o. male who presents for evaluation of a rash involving the lower extremity. Rash started 2 weeks ago. Lesions are red, and flat in texture. Rash has changed over time. Rash is painful. Associated symptoms: none. Patient denies: abdominal pain, arthralgia, congestion, cough, decrease in appetite, decrease in energy level, fever, headache, irritability, myalgia, nausea, sore throat and vomiting. Patient has not had contacts with similar rash. Patient has not had new exposures (soaps, lotions, laundry detergents, foods, medications, plants, insects or animals). Rash is aggravated after working all day. 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 Wears tight jeans at work. Has tried vaseline with mild relief. He had a similar rash 2 years ago.   Review of Systems  Per HPI  Patient Active Problem List   Diagnosis Date Noted  . T2DM (type 2 diabetes mellitus) ([place]) 08/12/2014    Current Outpatient Prescriptions on File Prior to Visit  Medication Sig Dispense Refill  . blood glucose meter kit and supplies KIT Dispense based on patient and insurance preference. Use up to four times daily as directed. (FOR ICD-9 250.00, 250.01). (Patient not taking: Reported on 09/18/2016) 1 each 0  . metFORMIN (GLUCOPHAGE) 1000 MG tablet Take 1 tablet (1,000 mg total) by mouth 2 (two) times daily with a meal. (Patient not taking: Reported on 09/18/2016) 180 tablet 3  . pravastatin (PRAVACHOL) 40 MG tablet Take 1 tablet (40 mg total) by mouth daily. (Patient not taking: Reported on 09/18/2016) 90 tablet 0   No current facility-administered medications on file prior to visit.     No Known Allergies   Objective:  BP 111/68 (BP Location: Right Arm, Patient Position: Sitting, Cuff Size: Normal)   Pulse 80   Temp 98.1 F (36.7 C) (Oral)   Resp 18   Ht _0  (1.6 m)   Wt 154 lb 9.6 oz (70.1 kg)   SpO2 98%   BMI 27.39 kg/m    Physical Exam  Constitutional: He is oriented to person, place, and time and well-developed, well-nourished, and in no distress.  HENT:  Head: Normocephalic and atraumatic.  Eyes: Conjunctivae are normal.  Neck: Normal range of motion.  Pulmonary/Chest: Effort normal.  Neurological: He is alert and oriented to person, place, and time. Gait normal.  Skin: Skin is warm and dry. Rash noted.  Erythematous macular rash with slight maceration of bilateral medial things and left gluteal fold, satellite lesions noted. No tenderness or warmth with palpation. See image below.    Psychiatric: Affect normal.  Vitals reviewed.        No results found for this or any previous visit (from the past 24 hour(s)).  Assessment and Plan :   1. Rash and nonspecific skin eruption Hx and PE findings consistent with intertriginous dermatitis. Will treat with both oral and topical agent. Pt instructed to avoid tight fitting clothes as much as possible, especially while at work. Return to clinic if symptoms worsen, do not improve, or as needed - POCT Skin KOH - fluconazole (DIFLUCAN) 100 MG tablet; Take 2 tablets (200 mg total) by mouth once a week.  Dispense: 8 tablet; Refill: 0 - clotrimazole-betamethasone (LOTRISONE) cream; Apply 1 application topically 2 (two) times daily.  Dispense: 30 g; Refill: 0  Tenna Delaine, PA-C  [facility] [place] 09/18/2016 6:15 PM

## 2016-09-19 ENCOUNTER — Ambulatory Visit: Payer: No Typology Code available for payment source | Admitting: Physician Assistant

## 2016-09-20 ENCOUNTER — Encounter: Payer: Self-pay | Admitting: Physician Assistant

## 2016-09-20 ENCOUNTER — Ambulatory Visit (INDEPENDENT_AMBULATORY_CARE_PROVIDER_SITE_OTHER): Payer: No Typology Code available for payment source | Admitting: Physician Assistant

## 2016-09-20 VITALS — BP 163/83 | HR 62 | Temp 98.1°F | Resp 16 | Ht 63.39 in | Wt 148.8 lb

## 2016-09-20 DIAGNOSIS — R030 Elevated blood-pressure reading, without diagnosis of hypertension: Secondary | ICD-10-CM | POA: Diagnosis not present

## 2016-09-20 DIAGNOSIS — Z1211 Encounter for screening for malignant neoplasm of colon: Secondary | ICD-10-CM

## 2016-09-20 DIAGNOSIS — E119 Type 2 diabetes mellitus without complications: Secondary | ICD-10-CM

## 2016-09-20 DIAGNOSIS — Z1329 Encounter for screening for other suspected endocrine disorder: Secondary | ICD-10-CM

## 2016-09-20 DIAGNOSIS — Z130 Encounter for screening for diseases of the blood and blood-forming organs and certain disorders involving the immune mechanism: Secondary | ICD-10-CM

## 2016-09-20 DIAGNOSIS — Z1389 Encounter for screening for other disorder: Secondary | ICD-10-CM | POA: Diagnosis not present

## 2016-09-20 DIAGNOSIS — Z0000 Encounter for general adult medical examination without abnormal findings: Secondary | ICD-10-CM

## 2016-09-20 DIAGNOSIS — E785 Hyperlipidemia, unspecified: Secondary | ICD-10-CM

## 2016-09-20 LAB — POCT URINALYSIS DIP (MANUAL ENTRY)
BILIRUBIN UA: NEGATIVE
Leukocytes, UA: NEGATIVE
NITRITE UA: NEGATIVE
Protein Ur, POC: NEGATIVE mg/dL
RBC UA: NEGATIVE
SPEC GRAV UA: 1.025 (ref 1.010–1.025)
Urobilinogen, UA: 0.2 E.U./dL
pH, UA: 5.5 (ref 5.0–8.0)

## 2016-09-20 LAB — POCT GLYCOSYLATED HEMOGLOBIN (HGB A1C): Hemoglobin A1C: 10.1

## 2016-09-20 LAB — GLUCOSE, POCT (MANUAL RESULT ENTRY): POC GLUCOSE: 267 mg/dL — AB (ref 70–99)

## 2016-09-20 MED ORDER — PRAVASTATIN SODIUM 40 MG PO TABS: 40.0000 mg | ORAL_TABLET | Freq: Every day | ORAL | 3 refills | Status: DC

## 2016-09-20 MED ORDER — GLIPIZIDE 5 MG PO TABS
5.0000 mg | ORAL_TABLET | Freq: Two times a day (BID) | ORAL | 0 refills | Status: DC
Start: 2016-09-20 — End: 2017-05-30

## 2016-09-20 MED ORDER — BLOOD GLUCOSE MONITOR KIT: PACK | 0 refills | Status: DC

## 2016-09-20 MED ORDER — METFORMIN HCL 1000 MG PO TABS: 1000.0000 mg | ORAL_TABLET | Freq: Two times a day (BID) | ORAL | 1 refills | Status: DC

## 2016-09-20 NOTE — Patient Instructions (Addendum)
We are going to add a medication to your diabetes regimen. It is called glipizide. You should take it as prescribed. I also recommend you start checking your fasting blood sugar in the morning. I have given you a prescription for a monitor. You should check it first thing in the morning before you eat. Your goal is between 70-100. Common side effects of this new medication are low blood sugar, diarrhea, nausea, and headache. It is really important that you also decrease any sweets and carbs in your diet. Cut down on sugary drinks and tortillas. Plan to follow up in 2 weeks for repeat blood sugar in office as your blood sugar is really high in office today. You also have glucose and ketones in your urine. If you develop any abdominal pain, nausea, vomiting, decreased urine output, or blurred vision please go to the ED immediately. Thank you for letting me participate in your health and well being.  In terms of elevated blood pressure, I would like you to check your blood pressure at least a couple times over the next week outside of the office and document these values. It is best if you check the blood pressure at different times in the day. Your goal is <140/90. If your values are consistently above this goal, please return to office for further evaluation. If you start to have chest pain, blurred vision, shortness of breath, severe headache, lower leg swelling, or nausea/vomiting please seek care immediately here or at the ED.    La diabetes mellitus y las normas bsicas de atencin mdica Diabetes Mellitus and Standards of Medical Care Tratar la diabetes (diabetes mellitus) puede ser complicado. Su tratamiento de la diabetes puede ser administrado por un equipo de profesionales de la salud, que incluye:  Un especialista en dietas y nutricin (nutricionista registrado).  Un enfermero.  Un educador certificado para el cuidado de la diabetes.  Un especialista en diabetes (endocrinlogo).  Un  oculista.  Un mdico de cabecera.  Un dentista.  Sus mdicos siguen un programa para ayudarle a Engineer, production calidad en atencin. El programa siguiente es una gua general para su plan de control de la diabetes. Sus mdicos tambin podrn darle instrucciones ms especficas. Anlisis de HbA1c ( hemoglobina A1c) Este anlisis proporciona informacin sobre el control de la glucemia (glucosa en la sangre) durante los ltimos 2 o 53mses. Se utiliza para verificar si el plan de control de la diabetes debe ser modificado.  Si cumple los objetivos del [place] este anlisis se realiza al mHalliburton Companyal ao.  Si no cumple los objetivos del tratamiento o si sus objetivos han cNepal este anlisis se realiza cuatro veces al ao.  Control de la presin arterial  Este control se realiza en cada visita mdica de rutina. Para la mComcast la meta es menos que 130/80. Consltele a su mdico cul es su meta para la presin arterial. Exmenes dentales y oculares  Visite al dAvayapor ao.  Si tiene diabetes tipo1, hgase un examen ocular en el trmino de 3 a 5aos despus del diagnstico y, luego, uArdelia Memsvez al ao despus del pTree surgeon ? Si le diagnosticaron diabetes tipo 1 siendo un nio, debe hacerse estudios al llegar a los 10 aos o ms y si ha tenido diabetes durante un perodo de 3 a 5 aos. Despus del primer examen, debe hacerse un examen ocular todos los aos.  Si tiene diabetes tipo2, hgase un examen ocular tan pronto como  le diagnostiquen la enfermedad y, [place], una vez por ao despus del Tree surgeon. Examen de los pies  Una inspeccin visual de pies se hace en cada visita mdica de rutina. En este examen se buscan cortes, moretones, enrojecimiento, ampollas, llagas u otros problemas en los pies.  Su mdico le Chartered certified accountant examen de pies completo una vez por ao. Este examen incluye revisar la estructura y la piel de los pies, y examinar los  pulsos y la sensacin de los pies. ? Diabetes tipo1: Hgase su primer examen en el trmino de 3 a 5 aos despus del diagnstico. ? Diabetes tipo2: Hgase su primer examen tan pronto como le diagnostiquen la enfermedad.  Examnese a diario los pies en busca de cortes, moretones, enrojecimiento, ampollas o llagas. Si tiene alguno de [place] u otros problemas y no se curan, pngase en contacto con su mdico. Estudio de la funcin renal ( microalbmina en orina)  Este estudio se realiza una vez por ao. ? Diabetes tipo1: Hgase su primer estudio cinco aos despus del diagnstico. ? Diabetes tipo2: Hgase su primer estudio tan pronto como le diagnostiquen la enfermedad.  Si tiene enfermedad renal crnica, hgase un examen de creatinina srica y de ndice de filtracin glomerular estimada (eGFR, por sus siglas en ingls) una vez por ao. Perfil lipdico (colesterol, HDL, LDL, triglicridos)  Este examen se debe realizar cuando le diagnostiquen diabetes y cada cinco aos luego del Tree surgeon. Si est tomando medicamentos para bajar su colesterol, es posible que se deba realizar este examen cada ao. ? En relacin al LDL, el objetivo es tener menos de 146m/dl (5,5 mmol/l). Si tiene aUnited Parcel el objetivo es tener menos de 70 mg/dl (3,9 mmol/l). ? En relacin al HDL, el objetivo es tener 40 mg/dl (2,2 mmol/l) para los hombres y 50 mg/dl (2,8 mmol/l) para las mujeres. Un nivel de colesterol HDL de 60 mg/dl (3,3 mmol/l) o superior da una cierta proteccin contra la enfermedad cardaca. ? En relacin a los triglicridos, el objetivo es tener menos de 150 mg/dl (8,3 mmol/l). Vacunas  Se recomienda aplicar de forma anual la vacuna contra la gripe (influenza) a todas las personas de 63mes en adelante que tengan diabetes.  La vacuna contra la neumona (antineumoccica) est recomendada para todas las personas de 2aos en adelante que tengan diabetes. Si tiene 65aos o ms, puede recibir laTeaching laboratory technicianomo una serie de dos inyecciones diPamelia Center Se recomienda administrar la vacuna contra la hepatitisB en adultos poco despus de que hayan recibido el diagnstico de diabetes.  La vacuna Tdap (contra el ttanos, la difteria y la tosferina) debe aplicarse de la siguiente manera: ? Segn las pautas normales de vacunacin infantil en el caso de los nios. ? Cada 10aos en el caso de los adultos con diabetes.  La vacuna contra la culebrilla (herpes zster) se recomienda en personas que han tenido varicela y que tiene 5032os de edad o ms. Salud mental y emocional  Se recomienda reOptometristontroles para deHydrographic surveyorntomas de trastornos de laYouth workeransiedad y depresin en el momento del diagnstico, y en una etapa posterior segn sea necesario. Si los controles revelan la presencia de sntomas (el resultado de los controles es positivo), es posible que deba someterse a evaluaciones posteriores y lo deriven a un mdico esRegulatory affairs officerEducacin para el autocontrol de la diabetes  Es recomendable que se informe sobre cmo controlar su diabetes en el momento de recibir el diagnstico y luNorwoodsegn sea  necesario. Plan de tratamiento  Su plan de tratamiento ser revisado en cada visita mdica. Resumen  Tratar la diabetes (diabetes mellitus) puede ser complicado. Su tratamiento de la diabetes puede ser administrado por un equipo de profesionales de Technical sales engineer.  Sus mdicos siguen un programa para ayudarle a Engineer, production calidad en atencin.  Las normas asistenciales incluyen realizarse, regularmente, exmenes fsicos, anlisis de sangre y controles de la presin arterial, aplicarse vacunas, someterse a estudios de control e informarse sobre cmo controlar su diabetes.  Sus mdicos tambin podrn darle instrucciones ms especficas basndose en su salud. Esta informacin no tiene Marine scientist el consejo del mdico. Asegrese de hacerle  al mdico cualquier pregunta que tenga. Document Released: 06/04/2009 Document Revised: 02/20/2016 Document Reviewed: 08/10/2012 Elsevier Interactive Patient Education  2018 [employer].  Diabetes Mellitus and Standards of Medical Care Managing diabetes (diabetes mellitus) can be complicated. Your diabetes treatment may be managed by a team of health care providers, including:  A diet and nutrition specialist (registered dietitian).  A nurse.  A certified diabetes educator (CDE).  A diabetes specialist (endocrinologist).  An eye doctor.  A primary care provider.  A dentist.  Your health care providers follow a schedule in order to help you get the best quality of care. The following schedule is a general guideline for your diabetes management plan. Your health care providers may also give you more specific instructions. HbA1c ( hemoglobin A1c) test This test provides information about blood sugar (glucose) control over the previous 2-3 months. It is used to check whether your diabetes management plan needs to be adjusted.  If you are meeting your treatment goals, this test is done at least 2 times a year.  If you are not meeting treatment goals or if your treatment goals have changed, this test is done 4 times a year.  Blood pressure test  This test is done at every routine medical visit. For most people, the goal is less than 130/80. Ask your health care provider what your goal blood pressure should be. Dental and eye exams  Visit your dentist two times a year.  If you have type 1 diabetes, get an eye exam 3-5 years after you are diagnosed, and then once a year after your first exam. ? If you were diagnosed with type 1 diabetes as a child, get an eye exam when you are age 45 or older and have had diabetes for 3-5 years. After the first exam, you should get an eye exam once a year.  If you have type 2 diabetes, have an eye exam as soon as you are diagnosed, and then once a  year after your first exam. Foot care exam  Visual foot exams are done at every routine medical visit. The exams check for cuts, bruises, redness, blisters, sores, or other problems with the feet.  A complete foot exam is done by your health care provider once a year. This exam includes an inspection of the structure and skin of your feet, and a check of the pulses and sensation in your feet. ? Type 1 diabetes: Get your first exam 3-5 years after diagnosis. ? Type 2 diabetes: Get your first exam as soon as you are diagnosed.  Check your feet every day for cuts, bruises, redness, blisters, or sores. If you have any of these or other problems that are not healing, contact your health care provider. Kidney function test ( urine microalbumin)  This test is done once a year. ?  Type 1 diabetes: Get your first test 5 years after diagnosis. ? Type 2 diabetes: Get your first test as soon as you are diagnosed.  If you have chronic kidney disease (CKD), get a serum creatinine and estimated glomerular filtration rate (eGFR) test once a year. Lipid profile (cholesterol, HDL, LDL, triglycerides)  This test should be done when you are diagnosed with diabetes, and every 5 years after the first test. If you are on medicines to lower your cholesterol, you may need to get this test done every year. ? The goal for LDL is less than 100 mg/dL (5.5 mmol/L). If you are at high risk, the goal is less than 70 mg/dL (3.9 mmol/L). ? The goal for HDL is 40 mg/dL (2.2 mmol/L) for men and 50 mg/dL(2.8 mmol/L) for women. An HDL cholesterol of 60 mg/dL (3.3 mmol/L) or higher gives some protection against heart disease. ? The goal for triglycerides is less than 150 mg/dL (8.3 mmol/L). Immunizations  The yearly flu (influenza) vaccine is recommended for everyone 6 months or older who has diabetes.  The pneumonia (pneumococcal) vaccine is recommended for everyone 2 years or older who has diabetes. If you are 72 or older,  you may get the pneumonia vaccine as a series of two separate shots.  The hepatitis B vaccine is recommended for adults shortly after they have been diagnosed with diabetes.  The Tdap (tetanus, diphtheria, and pertussis) vaccine should be given: ? According to normal childhood vaccination schedules, for children. ? Every 10 years, for adults who have diabetes.  The shingles vaccine is recommended for people who have had chicken pox and are 50 years or older. Mental and emotional health  Screening for symptoms of eating disorders, anxiety, and depression is recommended at the time of diagnosis and afterward as needed. If your screening shows that you have symptoms (you have a positive screening result), you may need further evaluation and be referred to a mental health care provider. Diabetes self-management education  Education about how to manage your diabetes is recommended at diagnosis and ongoing as needed. Treatment plan  Your treatment plan will be reviewed at every medical visit. Summary  Managing diabetes (diabetes mellitus) can be complicated. Your diabetes treatment may be managed by a team of health care providers.  Your health care providers follow a schedule in order to help you get the best quality of care.  Standards of care including having regular physical exams, blood tests, blood pressure monitoring, immunizations, screening tests, and education about how to manage your diabetes.  Your health care providers may also give you more specific instructions based on your individual health. This information is not intended to replace advice given to you by your health care provider. Make sure you discuss any questions you have with your health care provider. Document Released: 01/05/2009 Document Revised: 12/07/2015 Document Reviewed: 12/07/2015 Elsevier Interactive Patient Education  2018 [employer].    IF you received an x-ray today, you will receive an invoice from  [facility]. Please contact [facility] at [phone] with questions or concerns regarding your invoice.   IF you received labwork today, you will receive an invoice from [facility]. Please contact [facility] at [phone] with questions or concerns regarding your invoice.   Our billing staff will not be able to assist you with questions regarding bills from these companies.  You will be contacted with the lab results as soon as they are available. The fastest way to get your results is to activate your  My Chart account. Instructions are located on the last page of this paperwork. If you have not heard from [place] regarding the results in 2 weeks, please contact this office.

## 2016-09-20 NOTE — Progress Notes (Signed)
Arthur Howard  MRN: [id number] DOB: November 07, 1965  Subjective:  Pt  is a 51 y.o. male who presents for annual physical exam and medication refill. Pt Is fasting today.   Social: Works with Sales executive. Lives in [place]. Moved from [place] 35 years ago. He is married. Has 7 children (all girls). He is not sexually active at the moment.   Diet: Crackers, cookies, tacos, refried beans, mexican rice, porks, tortillas. Drinks coffee, 2 sodas a day, juices (orange juice/gatorade), and little water.   Exercise: No structured exercise. Has a strenuous job.   Bowel Movements: Once a day, normal for him  Sleep: 7-8 hours a night.    Last dental exam: 02/2016 Last vision exam: Never Last colonoscopy: Never Vaccinations      Tetanus: 09/27/14      Pneumococcal: 10/31/15   Chronic Conditions:  1) T2DM:Dx made 3 years ago. Controlled on metformin 1083m BID.  Has been out medication for 2 weeks. He does not check his sugars at home. Last follow up for diabetes was 10/31/15 and his A1C was 10.7.  2) Hyperlipidemia: Dx made one year ago. Controlled on pravastatin 559m has been out of medication for 2 weeks.    Pt is accompanied by his daugther, who is translating for him.  Patient Active Problem List   Diagnosis Date Noted  . T2DM (type 2 diabetes mellitus) ([place]05/21/2016    Current Outpatient Prescriptions on File Prior to Visit  Medication Sig Dispense Refill  . blood glucose meter kit and supplies KIT Dispense based on patient and insurance preference. Use up to four times daily as directed. (FOR ICD-9 250.00, 250.01). 1 each 0  . clotrimazole-betamethasone (LOTRISONE) cream Apply 1 application topically 2 (two) times daily. 30 g 0  . fluconazole (DIFLUCAN) 100 MG tablet Take 2 tablets (200 mg total) by mouth once a week. 8 tablet 0  . metFORMIN (GLUCOPHAGE) 1000 MG tablet Take 1 tablet (1,000 mg total) by mouth 2 (two) times daily with a meal. 180 tablet 3  . pravastatin (PRAVACHOL) 40  MG tablet Take 1 tablet (40 mg total) by mouth daily. 90 tablet 0   No current facility-administered medications on file prior to visit.     No Known Allergies  Social History   Social History  . Marital status: Married    Spouse name: N/A  . Number of children: N/A  . Years of education: N/A   Occupational History  . insulation     [employer]   Social History Main Topics  . Smoking status: Current Some Day Smoker  . Smokeless tobacco: Never Used  . Alcohol use No  . Drug use: No  . Sexual activity: Not Asked   Other Topics Concern  . None   Social History Narrative   Works with inSales executive[place] He is married. Has 7 children (all girls).     Past Surgical History:  Procedure Laterality Date  . ANKLE SURGERY      Family History  Problem Relation Age of Onset  . Diabetes Sister   . Diabetes Brother     Review of Systems  Constitutional: Negative for activity change, appetite change, chills, diaphoresis, fatigue, fever and unexpected weight change.  HENT: Negative for congestion, dental problem, drooling, ear discharge, ear pain, facial swelling, hearing loss, mouth sores, nosebleeds, postnasal drip, rhinorrhea, sinus pain, sinus pressure, sneezing, sore throat, tinnitus, trouble swallowing and voice change.   Eyes: Negative for photophobia, pain,  discharge, redness, itching and visual disturbance.  Respiratory: Negative for apnea, cough, choking, chest tightness, shortness of breath, wheezing and stridor.   Cardiovascular: Negative for chest pain, palpitations and leg swelling.  Gastrointestinal: Negative for abdominal distention, abdominal pain, anal bleeding, blood in stool, constipation, diarrhea, nausea, rectal pain and vomiting.  Endocrine: Negative for cold intolerance, heat intolerance, polydipsia, polyphagia and polyuria.  Genitourinary: Negative for decreased urine volume, difficulty urinating, discharge, dysuria,  enuresis, flank pain, frequency, genital sores, hematuria, penile pain, penile swelling, scrotal swelling, testicular pain and urgency.  Musculoskeletal: Negative for arthralgias, back pain, gait problem, joint swelling, myalgias, neck pain and neck stiffness.  Skin: Positive for rash. Negative for color change, pallor and wound.  Allergic/Immunologic: Negative for environmental allergies, food allergies and immunocompromised state.  Neurological: Negative for dizziness, tremors, seizures, syncope, facial asymmetry, speech difficulty, weakness, light-headedness, numbness and headaches.  Hematological: Negative for adenopathy. Does not bruise/bleed easily.  Psychiatric/Behavioral: Negative for agitation, behavioral problems, confusion, decreased concentration, dysphoric mood, hallucinations, self-injury, sleep disturbance and suicidal ideas. The patient is not nervous/anxious and is not hyperactive.     Objective:  BP (!) 163/83   Pulse 62   Temp 98.1 F (36.7 C) (Oral)   Resp 16   Ht 5' 3.39" (1.61 m)   Wt 148 lb 12.8 oz (67.5 kg)   SpO2 98%   BMI 26.04 kg/m    Visual Acuity Screening   Right eye Left eye Both eyes  Without correction:     With correction: '20/30 20/30 20/30 '    Physical Exam  Constitutional: He is oriented to person, place, and time and well-developed, well-nourished, and in no distress.  HENT:  Head: Normocephalic and atraumatic.  Right Ear: Hearing, tympanic membrane, external ear and ear canal normal.  Left Ear: Hearing, tympanic membrane, external ear and ear canal normal.  Nose: Nose normal.  Mouth/Throat: Uvula is midline, oropharynx is clear and moist and mucous membranes are normal. No oropharyngeal exudate.  Eyes: Conjunctivae and EOM are normal. Pupils are equal, round, and reactive to light.  Neck: Trachea normal and normal range of motion.  Cardiovascular: Normal rate, regular rhythm, normal heart sounds and intact distal pulses.   Pulmonary/Chest:  Effort normal and breath sounds normal.  Abdominal: Soft. Normal appearance and bowel sounds are normal.  Genitourinary: Prostate normal.  Musculoskeletal: Normal range of motion.  Lymphadenopathy:       Head (right side): No submental, no submandibular, no tonsillar, no preauricular, no posterior auricular and no occipital adenopathy present.       Head (left side): No submental, no submandibular, no tonsillar, no preauricular, no posterior auricular and no occipital adenopathy present.    He has no cervical adenopathy.       Right: No supraclavicular adenopathy present.       Left: No supraclavicular adenopathy present.  Neurological: He is alert and oriented to person, place, and time. He has normal sensation, normal strength and normal reflexes. Gait normal.  Skin: Skin is warm and dry.  Psychiatric: Affect normal.  Vitals reviewed.    BP Readings from Last 3 Encounters:  09/20/16 (!) 163/83  09/18/16 111/68  03/04/16 128/82    Results for orders placed or performed in visit on 09/20/16 (from the past 24 hour(s))  POCT urinalysis dipstick     Status: Abnormal   Collection Time: 09/20/16  9:42 AM  Result Value Ref Range   Color, UA yellow yellow   Clarity, UA clear clear   Glucose, UA >=  1,000 (A) negative mg/dL   Bilirubin, UA negative negative   Ketones, POC UA trace (5) (A) negative mg/dL   Spec Grav, UA 1.025 1.010 - 1.025   Blood, UA negative negative   pH, UA 5.5 5.0 - 8.0   Protein Ur, POC negative negative mg/dL   Urobilinogen, UA 0.2 0.2 or 1.0 E.U./dL   Nitrite, UA Negative Negative   Leukocytes, UA Negative Negative  POCT glycosylated hemoglobin (Hb A1C)     Status: None   Collection Time: 09/20/16  9:43 AM  Result Value Ref Range   Hemoglobin A1C 10.1   POCT glucose (manual entry)     Status: Abnormal   Collection Time: 09/20/16 10:02 AM  Result Value Ref Range   POC Glucose 267 (A) 70 - 99 mg/dl    Assessment and Plan :  Discussed healthy lifestyle,  diet, exercise, preventative care, vaccinations, and addressed patient's concerns. Plan for follow up in 2 weeks. Otherwise, plan for specific conditions below. 1. Annual physical exam Await lab results.   2. Type 2 diabetes mellitus without complication, without long-term current use of insulin (HCC) Uncontrolled. A1C of 10.1. He is asymptomatic at this time. Discussed lifestyle modifications in detail. Work on [employer] drinks and tortillas in diet. Will add 2nd agent at this time. Pt given Rx for glucometer. FBS goal is 70-100. Placed referral to diabetic nutrition. Plan for follow up in 2 weeks for POC glucose and UA. Will plan to add ACEI at next visit for kidney protection. - Ambulatory referral to Ophthalmology - CMP14+EGFR - POCT glycosylated hemoglobin (Hb A1C) - HM Diabetes Foot Exam - POCT glucose (manual entry) - glipiZIDE (GLUCOTROL) 5 MG tablet; Take 1 tablet (5 mg total) by mouth 2 (two) times daily before a meal.  Dispense: 90 tablet; Refill: 0 - metFORMIN (GLUCOPHAGE) 1000 MG tablet; Take 1 tablet (1,000 mg total) by mouth 2 (two) times daily with a meal.  Dispense: 180 tablet; Refill: 1 - blood glucose meter kit and supplies KIT; Dispense based on patient and insurance preference. Use up to four times daily as directed. (FOR ICD-9 250.00, 250.01).  Dispense: 1 each; Refill: 0 - Ambulatory referral to diabetic education  3. Hyperlipidemia, unspecified hyperlipidemia type - Lipid panel - pravastatin (PRAVACHOL) 40 MG tablet; Take 1 tablet (40 mg total) by mouth daily.  Dispense: 90 tablet; Refill: 3  4. Screen for colon cancer - Ambulatory referral to Gastroenterology  5. Screening, anemia, deficiency, iron - CBC with Differential/Platelet  6. Screening for hematuria or proteinuria - POCT urinalysis dipstick  7. Screening for thyroid disorder - TSH  8. Elevated blood pressure reading Asymptomatic.Pt just found out his nephew passed away so he is very stressed  this morning. Instructed to check bp outside of office over the next couple of weeks. Return in consistently >140/90. Given strict ED precautions.   Tenna Delaine, PA-C  [facility] 09/21/2016 9:39 PM

## 2016-09-21 LAB — CMP14+EGFR
ALBUMIN: 4.6 g/dL (ref 3.5–5.5)
ALK PHOS: 92 IU/L (ref 39–117)
ALT: 18 IU/L (ref 0–44)
AST: 14 IU/L (ref 0–40)
Albumin/Globulin Ratio: 1.7 (ref 1.2–2.2)
BILIRUBIN TOTAL: 0.6 mg/dL (ref 0.0–1.2)
BUN / CREAT RATIO: 19 (ref 9–20)
BUN: 16 mg/dL (ref 6–24)
CHLORIDE: 100 mmol/L (ref 96–106)
CO2: 20 mmol/L (ref 20–29)
Calcium: 9.8 mg/dL (ref 8.7–10.2)
Creatinine, Ser: 0.85 mg/dL (ref 0.76–1.27)
GFR calc Af Amer: 116 mL/min/{1.73_m2} (ref >59)
GFR calc non Af Amer: 100 mL/min/{1.73_m2} (ref >59)
GLOBULIN, TOTAL: 2.7 g/dL (ref 1.5–4.5)
GLUCOSE: 292 mg/dL — AB (ref 65–99)
POTASSIUM: 4.5 mmol/L (ref 3.5–5.2)
SODIUM: 137 mmol/L (ref 134–144)
Total Protein: 7.3 g/dL (ref 6.0–8.5)

## 2016-09-21 LAB — CBC WITH DIFFERENTIAL/PLATELET
BASOS ABS: 0 10*3/uL (ref 0.0–0.2)
Basos: 0 %
EOS (ABSOLUTE): 0 10*3/uL (ref 0.0–0.4)
Eos: 0 %
Hematocrit: 47.5 % (ref 37.5–51.0)
Hemoglobin: 16.2 g/dL (ref 13.0–17.7)
Immature Grans (Abs): 0 10*3/uL (ref 0.0–0.1)
Immature Granulocytes: 0 %
LYMPHS ABS: 2.1 10*3/uL (ref 0.7–3.1)
Lymphs: 27 %
MCH: 31.3 pg (ref 26.6–33.0)
MCHC: 34.1 g/dL (ref 31.5–35.7)
MCV: 92 fL (ref 79–97)
MONOS ABS: 0.5 10*3/uL (ref 0.1–0.9)
Monocytes: 7 %
Neutrophils Absolute: 5 10*3/uL (ref 1.4–7.0)
Neutrophils: 66 %
Platelets: 328 10*3/uL (ref 150–379)
RBC: 5.18 x10E6/uL (ref 4.14–5.80)
RDW: 12.9 % (ref 12.3–15.4)
WBC: 7.6 10*3/uL (ref 3.4–10.8)

## 2016-09-21 LAB — TSH: TSH: 1.41 u[IU]/mL (ref 0.450–4.500)

## 2016-09-21 LAB — LIPID PANEL
CHOLESTEROL TOTAL: 288 mg/dL — AB (ref 100–199)
Chol/HDL Ratio: 4.6 ratio (ref 0.0–5.0)
HDL: 62 mg/dL (ref >39)
LDL Calculated: 192 mg/dL — ABNORMAL HIGH (ref 0–99)
TRIGLYCERIDES: 169 mg/dL — AB (ref 0–149)
VLDL Cholesterol Cal: 34 mg/dL (ref 5–40)

## 2016-09-30 ENCOUNTER — Encounter: Payer: Self-pay | Admitting: Gastroenterology

## 2016-10-08 ENCOUNTER — Ambulatory Visit: Payer: No Typology Code available for payment source | Admitting: Physician Assistant

## 2016-12-12 ENCOUNTER — Encounter: Payer: Self-pay | Admitting: Gastroenterology

## 2016-12-29 ENCOUNTER — Encounter: Payer: Self-pay | Admitting: Physician Assistant

## 2017-05-30 ENCOUNTER — Encounter: Payer: Self-pay | Admitting: Family Medicine

## 2017-05-30 ENCOUNTER — Ambulatory Visit (INDEPENDENT_AMBULATORY_CARE_PROVIDER_SITE_OTHER): Payer: No Typology Code available for payment source | Admitting: Family Medicine

## 2017-05-30 VITALS — BP 138/77 | HR 63 | Temp 98.3°F | Resp 16 | Ht 63.39 in | Wt 154.8 lb

## 2017-05-30 DIAGNOSIS — M546 Pain in thoracic spine: Secondary | ICD-10-CM | POA: Diagnosis not present

## 2017-05-30 DIAGNOSIS — E1165 Type 2 diabetes mellitus with hyperglycemia: Secondary | ICD-10-CM

## 2017-05-30 DIAGNOSIS — E785 Hyperlipidemia, unspecified: Secondary | ICD-10-CM

## 2017-05-30 LAB — POCT URINALYSIS DIP (MANUAL ENTRY)
Bilirubin, UA: NEGATIVE
Blood, UA: NEGATIVE
Glucose, UA: 500 mg/dL — AB
Ketones, POC UA: NEGATIVE mg/dL
Leukocytes, UA: NEGATIVE
Nitrite, UA: NEGATIVE
Protein Ur, POC: NEGATIVE mg/dL
Spec Grav, UA: 1.02 (ref 1.010–1.025)
Urobilinogen, UA: 0.2 E.U./dL
pH, UA: 5.5 (ref 5.0–8.0)

## 2017-05-30 LAB — POCT GLYCOSYLATED HEMOGLOBIN (HGB A1C): Hemoglobin A1C: 9.5

## 2017-05-30 MED ORDER — METFORMIN HCL 1000 MG PO TABS: 1000.0000 mg | ORAL_TABLET | Freq: Two times a day (BID) | ORAL | 1 refills | Status: DC

## 2017-05-30 MED ORDER — ATORVASTATIN CALCIUM 40 MG PO TABS: 40.0000 mg | ORAL_TABLET | Freq: Every day | ORAL | 1 refills | Status: DC

## 2017-05-30 MED ORDER — GLIPIZIDE 5 MG PO TABS: 5.0000 mg | ORAL_TABLET | Freq: Two times a day (BID) | ORAL | 1 refills | Status: DC

## 2017-05-30 NOTE — Patient Instructions (Addendum)
IF you received an x-ray today, you will receive an invoice from [facility]. Please contact [facility] at [phone] with questions or concerns regarding your invoice.   IF you received labwork today, you will receive an invoice from [facility]. Please contact [facility] at [phone] with questions or concerns regarding your invoice.   Our billing staff will not be able to assist you with questions regarding bills from these companies.  You will be contacted with the lab results as soon as they are available. The fastest way to get your results is to activate your My Chart account. Instructions are located on the last page of this paperwork. If you have not heard from [place] regarding the results in 2 weeks, please contact this office.     La diabetes mellitus y las normas bsicas de atencin mdica Diabetes Mellitus and Standards of Medical Care Tratar la diabetes (diabetes mellitus) puede ser complicado. Su tratamiento de la diabetes puede ser administrado por un equipo de profesionales de la salud, que incluye:  Un especialista en dietas y nutricin (nutricionista registrado).  Un enfermero.  Un educador certificado para el cuidado de la diabetes.  Un especialista en diabetes (endocrinlogo).  Un oculista.  Un mdico de cabecera.  Un dentista.  Sus mdicos siguen un programa para ayudarle a Engineer, production calidad en atencin. El programa siguiente es una gua general para su plan de control de la diabetes. Sus mdicos tambin podrn darle instrucciones ms especficas. Anlisis de HbA1c ( hemoglobina A1c) Este anlisis proporciona informacin sobre el control de la glucemia (glucosa en la sangre) durante los ltimos 2 o 51mses. Se utiliza para verificar si el plan de control de la diabetes debe ser modificado.  Si cumple los objetivos del t[place] este anlisis se realiza al mHalliburton Companyal ao.  Si no cumple los objetivos del tratamiento o si  sus objetivos han cNepal este anlisis se realiza cuatro veces al ao.  Control de la presin arterial  Este control se realiza en cada visita mdica de rutina. Para la mComcast la meta es menos que 130/80. Consltele a su mdico cul es su meta para la presin arterial. Exmenes dentales y oculares  Visite al dAvayapor ao.  Si tiene diabetes tipo1, hgase un examen ocular en el trmino de 3 a 5aos despus del diagnstico y, luego, uArdelia Memsvez al ao despus del pTree surgeon ? Si le diagnosticaron diabetes tipo 1 siendo un nio, debe hacerse estudios al llegar a los 10 aos o ms y si ha tenido diabetes durante un perodo de 3 a 5 aos. Despus del primer examen, debe hacerse un examen ocular todos los aos.  Si tiene diabetes tipo2, hgase un examen ocular tan pronto como le diagnostiquen la enfermedad y, lG[place] Campbell una vez por ao despus del pTree surgeon Examen de los pies  Una inspeccin visual de pies se hace en cada visita mdica de rutina. En este examen se buscan cortes, moretones, enrojecimiento, ampollas, llagas u otros problemas en los pies.  Su mdico le rChartered certified accountantexamen de pies completo una vez por ao. Este examen incluye revisar la estructura y la piel de los pies, y examinar los pulsos y la sensacin de los pies. ? Diabetes tipo1: Hgase su primer examen en el trmino de 3 a 5 aos despus del diagnstico. ? Diabetes tipo2: Hgase su primer examen tan pronto como le diagnostiquen la enfermedad.  Examnese a diario los pies en busca  de cortes, moretones, enrojecimiento, ampollas o llagas. Si tiene alguno de [place] u otros problemas y no se curan, pngase en contacto con su mdico. Estudio de la funcin renal ( microalbmina en orina)  Este estudio se realiza una vez por ao. ? Diabetes tipo1: Hgase su primer estudio cinco aos despus del diagnstico. ? Diabetes tipo2: Hgase su primer estudio tan pronto como le diagnostiquen la  enfermedad.  Si tiene enfermedad renal crnica, hgase un examen de creatinina srica y de ndice de filtracin glomerular estimada (eGFR, por sus siglas en ingls) una vez por ao. Perfil lipdico (colesterol, HDL, LDL, triglicridos)  Este examen se debe realizar cuando le diagnostiquen diabetes y cada cinco aos luego del Tree surgeon. Si est tomando medicamentos para bajar su colesterol, es posible que se deba realizar este examen cada ao. ? En relacin al LDL, el objetivo es tener menos de 140m/dl (5,5 mmol/l). Si tiene aUnited Parcel el objetivo es tener menos de 70 mg/dl (3,9 mmol/l). ? En relacin al HDL, el objetivo es tener 40 mg/dl (2,2 mmol/l) para los hombres y 50 mg/dl (2,8 mmol/l) para las mujeres. Un nivel de colesterol HDL de 60 mg/dl (3,3 mmol/l) o superior da una cierta proteccin contra la enfermedad cardaca. ? En relacin a los triglicridos, el objetivo es tener menos de 150 mg/dl (8,3 mmol/l). Vacunas  Se recomienda aplicar de forma anual la vacuna contra la gripe (influenza) a todas las personas de 628mes en adelante que tengan diabetes.  La vacuna contra la neumona (antineumoccica) est recomendada para todas las personas de 2aos en adelante que tengan diabetes. Si tiene 65aos o ms, puede recibir laEngineer, manufacturingomo una serie de dos inyecciones diNorth Yelm Se recomienda administrar la vacuna contra la hepatitisB en adultos poco despus de que hayan recibido el diagnstico de diabetes.  La vacuna Tdap (contra el ttanos, la difteria y la tosferina) debe aplicarse de la siguiente manera: ? Segn las pautas normales de vacunacin infantil en el caso de los nios. ? Cada 10aos en el caso de los adultos con diabetes.  La vacuna contra la culebrilla (herpes zster) se recomienda en personas que han tenido varicela y que tiene 5017os de edad o ms. Salud mental y emocional  Se recomienda reOptometristontroles para deHydrographic surveyorntomas de trastornos de  laYouth workeransiedad y depresin en el momento del diagnstico, y en una etapa posterior segn sea necesario. Si los controles revelan la presencia de sntomas (el resultado de los controles es positivo), es posible que deba someterse a evaluaciones posteriores y lo deriven a un mdico esRegulatory affairs officerEducacin para el autocontrol de la diabetes  Es recomendable que se informe sobre cmo controlar su diabetes en el momento de recibir el diagnstico y luHalstadsegn sea necesario. Plan de tratamiento  Su plan de tratamiento ser revisado en cada visita mdica. Resumen  Tratar la diabetes (diabetes mellitus) puede ser complicado. Su tratamiento de la diabetes puede ser administrado por un equipo de profesionales de laTechnical sales engineer Sus mdicos siguen un programa para ayudarle a obEngineer, productionalidad en atencin.  Las normas asistenciales incluyen realizarse, regularmente, exmenes fsicos, anlisis de sangre y controles de la presin arterial, aplicarse vacunas, someterse a estudios de control e informarse sobre cmo controlar su diabetes.  Sus mdicos tambin podrn darle instrucciones ms especficas basndose en su salud. Esta informacin no tiene coMarine scientistl consejo del mdico. Asegrese de hacerle al mdico cualquier pregunta que tenga. Document Released: 06/04/2009  Document Revised: 02/20/2016 Document Reviewed: 08/10/2012 Elsevier Interactive Patient Education  [employer].

## 2017-05-30 NOTE — Progress Notes (Signed)
3/9/201911:25 AM  Arthur Howard 12/10/1965, 52 y.o. male [id number]  Chief Complaint  Patient presents with  . Back Pain    Right side,    HPI:   Patient is a 52 y.o. male with past medical history significant for DM2 and HLP who presents today for mid back pain, bilateral that started 2 weeks ago after falling asleep on a hard uncomfortable chair.  He states left side resolved, right side significantly better, worried about his kidneys. He denies any h/o kidney stones, he denies any dysuria or hematuria. He denies any radiation of pain down his legs, he denies any midline back pain.  His last visit was done 08/2016, a1c 10.1, LDL 192 He does not take medications as prescribed, skips meds 3-4 days a week He is walking more so feels that he does not need his medications every day He denies any side effects to medications He does not come in for routine DM care    Depression screen [facility] 2/9 05/30/2017 09/20/2016 09/18/2016  Decreased Interest 0 0 0  Down, Depressed, Hopeless 0 0 0  PHQ - 2 Score 0 0 0    No Known Allergies  Prior to Admission medications   Medication Sig Start Date End Date Taking? Authorizing Provider  blood glucose meter kit and supplies KIT Dispense based on patient and insurance preference. Use up to four times daily as directed. (FOR ICD-9 250.00, 250.01). Patient not taking: Reported on 05/30/2017 09/20/16   Tenna Delaine D, PA-C  clotrimazole-betamethasone (LOTRISONE) cream Apply 1 application topically 2 (two) times daily. Patient not taking: Reported on 05/30/2017 09/18/16   Tenna Delaine D, PA-C  glipiZIDE (GLUCOTROL) 5 MG tablet Take 1 tablet (5 mg total) by mouth 2 (two) times daily before a meal. Patient not taking: Reported on 05/30/2017 09/20/16   Leonie Douglas, PA-C  metFORMIN (GLUCOPHAGE) 1000 MG tablet Take 1 tablet (1,000 mg total) by mouth 2 (two) times daily with a meal. Patient not taking: Reported on 05/30/2017 09/20/16   Tenna Delaine D, PA-C  pravastatin (PRAVACHOL) 40 MG tablet Take 1 tablet (40 mg total) by mouth daily. Patient not taking: Reported on 05/30/2017 09/20/16   Donzetta Kohut    Past Medical History:  Diagnosis Date  . Diabetes mellitus without complication ([place])   . Hyperlipidemia     Past Surgical History:  Procedure Laterality Date  . ANKLE SURGERY      Social History   Tobacco Use  . Smoking status: Current Some Day Smoker  . Smokeless tobacco: Never Used  Substance Use Topics  . Alcohol use: No    Alcohol/week: 0.0 oz    Family History  Problem Relation Age of Onset  . Diabetes Sister   . Diabetes Brother     Review of Systems  Constitutional: Negative for chills and fever.  Respiratory: Negative for cough and shortness of breath.   Cardiovascular: Negative for chest pain, palpitations and leg swelling.  Gastrointestinal: Negative for abdominal pain, nausea and vomiting.     OBJECTIVE:  Blood pressure 138/77, pulse 63, temperature 98.3 F (36.8 C), temperature source Oral, resp. rate 16, height 5' 3.39" (1.61 m), weight 154 lb 12.8 oz (70.2 kg), SpO2 99 %.  Wt Readings from Last 3 Encounters:  05/30/17 154 lb 12.8 oz (70.2 kg)  09/20/16 148 lb 12.8 oz (67.5 kg)  09/18/16 154 lb 9.6 oz (70.1 kg)    Physical Exam  Constitutional: He is oriented to person, place, and  time and well-developed, well-nourished, and in no distress.  HENT:  Head: Normocephalic and atraumatic.  Mouth/Throat: Oropharynx is clear and moist.  Eyes: EOM are normal. Pupils are equal, round, and reactive to light.  Neck: Neck supple.  Cardiovascular: Normal rate and regular rhythm. Exam reveals no gallop and no friction rub.  No murmur heard. Pulmonary/Chest: Effort normal and breath sounds normal. He has no wheezes. He has no rales.  Abdominal: There is no CVA tenderness.  Musculoskeletal:       Thoracic back: Normal.  Neurological: He is alert and oriented to person, place, and  time. Gait normal.  Skin: Skin is warm and dry.    Results for orders placed or performed in visit on 05/30/17 (from the past 24 hour(s))  POCT A1C     Status: None   Collection Time: 05/30/17 11:54 AM  Result Value Ref Range   Hemoglobin A1C 9.5   POCT urinalysis dipstick     Status: Abnormal   Collection Time: 05/30/17 11:55 AM  Result Value Ref Range   Color, UA yellow yellow   Clarity, UA clear clear   Glucose, UA =500 (A) negative mg/dL   Bilirubin, UA negative negative   Ketones, POC UA negative negative mg/dL   Spec Grav, UA 1.020 1.010 - 1.025   Blood, UA negative negative   pH, UA 5.5 5.0 - 8.0   Protein Ur, POC negative negative mg/dL   Urobilinogen, UA 0.2 0.2 or 1.0 E.U./dL   Nitrite, UA Negative Negative   Leukocytes, UA Negative Negative    ASSESSMENT and PLAN  1. Type 2 diabetes mellitus with hyperglycemia, without long-term current use of insulin (HCC) a1c mildly improved but still above goal with glocusuria. Over 50% of this 25 minute visit was spent discussing consequences of uncontrolled dm, medication compliance, LFM.  - POCT A1C - Microalbumin/Creatinine Ratio, Urine - Comprehensive metabolic panel - Lipid panel - POCT urinalysis dipstick - glipiZIDE (GLUCOTROL) 5 MG tablet; Take 1 tablet (5 mg total) by mouth 2 (two) times daily before a meal. - metFORMIN (GLUCOPHAGE) 1000 MG tablet; Take 1 tablet (1,000 mg total) by mouth 2 (two) times daily with a meal  2. Hyperlipidemia, unspecified hyperlipidemia type Rechecking lipids today, given past LDL 192 changing to high dose intensity statin.  - atorvastatin (LIPITOR) 40 MG tablet; Take 1 tablet (40 mg total) by mouth daily.  3. Acute bilateral thoracic back pain Resolving, MSK in nature. Discussed supportive measures.    Return in about 3 months (around 08/30/2017).    Rutherford Guys, MD [facility] [place], [place] [address] Ph.  [phone] Fax [phone]

## 2017-05-31 LAB — LIPID PANEL
Chol/HDL Ratio: 4.6 ratio (ref 0.0–5.0)
Cholesterol, Total: 217 mg/dL — ABNORMAL HIGH (ref 100–199)
HDL: 47 mg/dL (ref >39)
LDL Calculated: 112 mg/dL — ABNORMAL HIGH (ref 0–99)
Triglycerides: 292 mg/dL — ABNORMAL HIGH (ref 0–149)
VLDL Cholesterol Cal: 58 mg/dL — ABNORMAL HIGH (ref 5–40)

## 2017-05-31 LAB — COMPREHENSIVE METABOLIC PANEL
ALT: 13 IU/L (ref 0–44)
AST: 19 IU/L (ref 0–40)
Albumin/Globulin Ratio: 1.6 (ref 1.2–2.2)
Albumin: 4.1 g/dL (ref 3.5–5.5)
Alkaline Phosphatase: 71 IU/L (ref 39–117)
BUN/Creatinine Ratio: 16 (ref 9–20)
BUN: 13 mg/dL (ref 6–24)
Bilirubin Total: 0.4 mg/dL (ref 0.0–1.2)
CO2: 26 mmol/L (ref 20–29)
Calcium: 9.4 mg/dL (ref 8.7–10.2)
Chloride: 98 mmol/L (ref 96–106)
Creatinine, Ser: 0.79 mg/dL (ref 0.76–1.27)
GFR calc Af Amer: 119 mL/min/{1.73_m2} (ref >59)
GFR calc non Af Amer: 103 mL/min/{1.73_m2} (ref >59)
Globulin, Total: 2.6 g/dL (ref 1.5–4.5)
Glucose: 314 mg/dL — ABNORMAL HIGH (ref 65–99)
Potassium: 4.9 mmol/L (ref 3.5–5.2)
Sodium: 139 mmol/L (ref 134–144)
Total Protein: 6.7 g/dL (ref 6.0–8.5)

## 2017-05-31 LAB — MICROALBUMIN / CREATININE URINE RATIO
Creatinine, Urine: 84.5 mg/dL
Microalb/Creat Ratio: <3.6 mg/g creat (ref 0.0–30.0)
Microalbumin, Urine: <3 ug/mL

## 2017-09-04 ENCOUNTER — Ambulatory Visit: Payer: No Typology Code available for payment source | Admitting: Family Medicine

## 2018-05-06 ENCOUNTER — Inpatient Hospital Stay (HOSPITAL_COMMUNITY): Admit: 2018-05-06 | Payer: Self-pay

## 2018-05-06 ENCOUNTER — Emergency Department (HOSPITAL_COMMUNITY): Payer: Commercial Indemnity

## 2018-05-06 ENCOUNTER — Inpatient Hospital Stay (HOSPITAL_COMMUNITY)
Admission: EM | Admit: 2018-05-06 | Discharge: 2018-05-11 | DRG: 065 | Disposition: A | Discharge status: Rehabilitation Facility | Payer: Commercial Indemnity | Attending: Internal Medicine | Admitting: Internal Medicine

## 2018-05-06 ENCOUNTER — Other Ambulatory Visit: Payer: Self-pay

## 2018-05-06 ENCOUNTER — Inpatient Hospital Stay (HOSPITAL_COMMUNITY): Payer: Commercial Indemnity

## 2018-05-06 ENCOUNTER — Encounter (HOSPITAL_COMMUNITY): Payer: Self-pay

## 2018-05-06 DIAGNOSIS — G8191 Hemiplegia, unspecified affecting right dominant side: Secondary | ICD-10-CM | POA: Diagnosis not present

## 2018-05-06 DIAGNOSIS — I639 Cerebral infarction, unspecified: Principal | ICD-10-CM | POA: Diagnosis present

## 2018-05-06 DIAGNOSIS — I6381 Other cerebral infarction due to occlusion or stenosis of small artery: Secondary | ICD-10-CM | POA: Diagnosis not present

## 2018-05-06 DIAGNOSIS — R402252 Coma scale, best verbal response, oriented, at arrival to emergency department: Secondary | ICD-10-CM | POA: Diagnosis present

## 2018-05-06 DIAGNOSIS — Z7984 Long term (current) use of oral hypoglycemic drugs: Secondary | ICD-10-CM

## 2018-05-06 DIAGNOSIS — I69392 Facial weakness following cerebral infarction: Secondary | ICD-10-CM

## 2018-05-06 DIAGNOSIS — R402142 Coma scale, eyes open, spontaneous, at arrival to emergency department: Secondary | ICD-10-CM | POA: Diagnosis present

## 2018-05-06 DIAGNOSIS — I6300 Cerebral infarction due to thrombosis of unspecified precerebral artery: Secondary | ICD-10-CM

## 2018-05-06 DIAGNOSIS — E113293 Type 2 diabetes mellitus with mild nonproliferative diabetic retinopathy without macular edema, bilateral: Secondary | ICD-10-CM

## 2018-05-06 DIAGNOSIS — E785 Hyperlipidemia, unspecified: Secondary | ICD-10-CM | POA: Diagnosis present

## 2018-05-06 DIAGNOSIS — E1142 Type 2 diabetes mellitus with diabetic polyneuropathy: Secondary | ICD-10-CM | POA: Diagnosis present

## 2018-05-06 DIAGNOSIS — R110 Nausea: Secondary | ICD-10-CM | POA: Diagnosis not present

## 2018-05-06 DIAGNOSIS — E119 Type 2 diabetes mellitus without complications: Secondary | ICD-10-CM | POA: Diagnosis not present

## 2018-05-06 DIAGNOSIS — R402362 Coma scale, best motor response, obeys commands, at arrival to emergency department: Secondary | ICD-10-CM | POA: Diagnosis present

## 2018-05-06 DIAGNOSIS — E113393 Type 2 diabetes mellitus with moderate nonproliferative diabetic retinopathy without macular edema, bilateral: Secondary | ICD-10-CM

## 2018-05-06 DIAGNOSIS — Z9119 Patient's noncompliance with other medical treatment and regimen: Secondary | ICD-10-CM | POA: Diagnosis not present

## 2018-05-06 DIAGNOSIS — Z833 Family history of diabetes mellitus: Secondary | ICD-10-CM | POA: Diagnosis not present

## 2018-05-06 DIAGNOSIS — I69351 Hemiplegia and hemiparesis following cerebral infarction affecting right dominant side: Secondary | ICD-10-CM

## 2018-05-06 DIAGNOSIS — E1151 Type 2 diabetes mellitus with diabetic peripheral angiopathy without gangrene: Secondary | ICD-10-CM | POA: Diagnosis present

## 2018-05-06 DIAGNOSIS — R29810 Facial weakness: Secondary | ICD-10-CM | POA: Diagnosis present

## 2018-05-06 DIAGNOSIS — Z79899 Other long term (current) drug therapy: Secondary | ICD-10-CM

## 2018-05-06 DIAGNOSIS — Z720 Tobacco use: Secondary | ICD-10-CM

## 2018-05-06 DIAGNOSIS — E1165 Type 2 diabetes mellitus with hyperglycemia: Secondary | ICD-10-CM

## 2018-05-06 DIAGNOSIS — I10 Essential (primary) hypertension: Secondary | ICD-10-CM | POA: Diagnosis present

## 2018-05-06 DIAGNOSIS — Z794 Long term (current) use of insulin: Secondary | ICD-10-CM

## 2018-05-06 DIAGNOSIS — R29702 NIHSS score 2: Secondary | ICD-10-CM | POA: Diagnosis present

## 2018-05-06 DIAGNOSIS — F17210 Nicotine dependence, cigarettes, uncomplicated: Secondary | ICD-10-CM | POA: Diagnosis present

## 2018-05-06 DIAGNOSIS — I6350 Cerebral infarction due to unspecified occlusion or stenosis of unspecified cerebral artery: Secondary | ICD-10-CM | POA: Diagnosis not present

## 2018-05-06 HISTORY — DX: Cerebral infarction, unspecified: I63.9

## 2018-05-06 LAB — COMPREHENSIVE METABOLIC PANEL
ALT: 23 U/L (ref 0–44)
AST: 20 U/L (ref 15–41)
Albumin: 3.6 g/dL (ref 3.5–5.0)
Alkaline Phosphatase: 57 U/L (ref 38–126)
Anion gap: 11 (ref 5–15)
BUN: 15 mg/dL (ref 6–20)
CALCIUM: 9.3 mg/dL (ref 8.9–10.3)
CO2: 25 mmol/L (ref 22–32)
Chloride: 99 mmol/L (ref 98–111)
Creatinine, Ser: 0.8 mg/dL (ref 0.61–1.24)
GFR calc Af Amer: >60 mL/min (ref >60)
GFR calc non Af Amer: >60 mL/min (ref >60)
Glucose, Bld: 297 mg/dL — ABNORMAL HIGH (ref 70–99)
Potassium: 4.2 mmol/L (ref 3.5–5.1)
SODIUM: 135 mmol/L (ref 135–145)
Total Bilirubin: 0.9 mg/dL (ref 0.3–1.2)
Total Protein: 6.5 g/dL (ref 6.5–8.1)

## 2018-05-06 LAB — DIFFERENTIAL
Abs Immature Granulocytes: 0.04 10*3/uL (ref 0.00–0.07)
BASOS ABS: 0 10*3/uL (ref 0.0–0.1)
Basophils Relative: 0 %
Eosinophils Absolute: 0.2 10*3/uL (ref 0.0–0.5)
Eosinophils Relative: 2 %
Immature Granulocytes: 1 %
Lymphocytes Relative: 45 %
Lymphs Abs: 3.7 10*3/uL (ref 0.7–4.0)
Monocytes Absolute: 0.7 10*3/uL (ref 0.1–1.0)
Monocytes Relative: 9 %
Neutro Abs: 3.5 10*3/uL (ref 1.7–7.7)
Neutrophils Relative %: 43 %

## 2018-05-06 LAB — CBC
HEMATOCRIT: 45.6 % (ref 39.0–52.0)
Hemoglobin: 15.6 g/dL (ref 13.0–17.0)
MCH: 30.5 pg (ref 26.0–34.0)
MCHC: 34.2 g/dL (ref 30.0–36.0)
MCV: 89.2 fL (ref 80.0–100.0)
Platelets: 334 10*3/uL (ref 150–400)
RBC: 5.11 MIL/uL (ref 4.22–5.81)
RDW: 11.9 % (ref 11.5–15.5)
WBC: 8.1 10*3/uL (ref 4.0–10.5)
nRBC: 0 % (ref 0.0–0.2)

## 2018-05-06 LAB — TSH: TSH: 1.891 u[IU]/mL (ref 0.350–4.500)

## 2018-05-06 LAB — GLUCOSE, CAPILLARY
Glucose-Capillary: 241 mg/dL — ABNORMAL HIGH (ref 70–99)
Glucose-Capillary: 145 mg/dL — ABNORMAL HIGH (ref 70–99)

## 2018-05-06 LAB — PROTIME-INR
INR: 0.99
Prothrombin Time: 13 seconds (ref 11.4–15.2)

## 2018-05-06 LAB — APTT: aPTT: 31 seconds (ref 24–36)

## 2018-05-06 LAB — CBG MONITORING, ED: Glucose-Capillary: 215 mg/dL — ABNORMAL HIGH (ref 70–99)

## 2018-05-06 MED ORDER — INSULIN ASPART 100 UNIT/ML ~~LOC~~ SOLN
0.0000 [IU] | Freq: Three times a day (TID) | SUBCUTANEOUS | Status: DC
  Administered 2018-05-06: 5 [IU] via SUBCUTANEOUS
  Administered 2018-05-07 (×3): 3 [IU] via SUBCUTANEOUS
  Administered 2018-05-08: 5 [IU] via SUBCUTANEOUS
  Administered 2018-05-08: 8 [IU] via SUBCUTANEOUS
  Administered 2018-05-09 (×3): 3 [IU] via SUBCUTANEOUS
  Administered 2018-05-10: 5 [IU] via SUBCUTANEOUS
  Administered 2018-05-11: 2 [IU] via SUBCUTANEOUS

## 2018-05-06 MED ORDER — LORAZEPAM 2 MG/ML IJ SOLN
1.0000 mg | Freq: Once | INTRAMUSCULAR | Status: AC
  Administered 2018-05-06: 1 mg via INTRAVENOUS
  Filled 2018-05-06: qty 1

## 2018-05-06 MED ORDER — ACETAMINOPHEN 650 MG RE SUPP: 650.0000 mg | RECTAL | Status: DC | PRN

## 2018-05-06 MED ORDER — SODIUM CHLORIDE 0.9 % IV SOLN
INTRAVENOUS | Status: DC
  Administered 2018-05-06 – 2018-05-09 (×4): via INTRAVENOUS

## 2018-05-06 MED ORDER — SENNOSIDES-DOCUSATE SODIUM 8.6-50 MG PO TABS: 1.0000 | ORAL_TABLET | Freq: Every evening | ORAL | Status: DC | PRN

## 2018-05-06 MED ORDER — IOPAMIDOL (ISOVUE-370) INJECTION 76%
100.0000 mL | Freq: Once | INTRAVENOUS | Status: AC | PRN
  Administered 2018-05-06: 100 mL via INTRAVENOUS

## 2018-05-06 MED ORDER — OXYCODONE HCL 5 MG PO TABS
5.0000 mg | ORAL_TABLET | ORAL | Status: DC | PRN
  Administered 2018-05-06 – 2018-05-10 (×5): 5 mg via ORAL
  Filled 2018-05-06 (×5): qty 1

## 2018-05-06 MED ORDER — ASPIRIN EC 81 MG PO TBEC
81.0000 mg | DELAYED_RELEASE_TABLET | Freq: Every day | ORAL | Status: DC
  Administered 2018-05-07 – 2018-05-11 (×5): 81 mg via ORAL
  Filled 2018-05-06 (×5): qty 1

## 2018-05-06 MED ORDER — ATORVASTATIN CALCIUM 80 MG PO TABS
80.0000 mg | ORAL_TABLET | Freq: Every day | ORAL | Status: DC
  Administered 2018-05-06 – 2018-05-11 (×6): 80 mg via ORAL
  Filled 2018-05-06 (×6): qty 1

## 2018-05-06 MED ORDER — ASPIRIN 300 MG RE SUPP: 300.0000 mg | Freq: Every day | RECTAL | Status: DC

## 2018-05-06 MED ORDER — ENOXAPARIN SODIUM 40 MG/0.4ML ~~LOC~~ SOLN
40.0000 mg | SUBCUTANEOUS | Status: DC
  Administered 2018-05-06 – 2018-05-11 (×6): 40 mg via SUBCUTANEOUS
  Filled 2018-05-06 (×7): qty 0.4

## 2018-05-06 MED ORDER — INSULIN ASPART 100 UNIT/ML ~~LOC~~ SOLN
0.0000 [IU] | Freq: Every day | SUBCUTANEOUS | Status: DC
  Administered 2018-05-07: 3 [IU] via SUBCUTANEOUS

## 2018-05-06 MED ORDER — ACETAMINOPHEN 325 MG PO TABS
650.0000 mg | ORAL_TABLET | ORAL | Status: DC | PRN
  Administered 2018-05-08: 650 mg via ORAL
  Filled 2018-05-06: qty 2

## 2018-05-06 MED ORDER — CLOPIDOGREL BISULFATE 75 MG PO TABS
75.0000 mg | ORAL_TABLET | Freq: Every day | ORAL | Status: DC
  Administered 2018-05-06 – 2018-05-11 (×6): 75 mg via ORAL
  Filled 2018-05-06 (×6): qty 1

## 2018-05-06 MED ORDER — STROKE: EARLY STAGES OF RECOVERY BOOK
Freq: Once | Status: AC
  Administered 2018-05-06: 1
  Filled 2018-05-06: qty 1

## 2018-05-06 MED ORDER — ACETAMINOPHEN 160 MG/5ML PO SOLN: 650.0000 mg | ORAL | Status: DC | PRN

## 2018-05-06 MED ORDER — ASPIRIN 325 MG PO TABS
325.0000 mg | ORAL_TABLET | Freq: Every day | ORAL | Status: DC
  Administered 2018-05-06: 325 mg via ORAL
  Filled 2018-05-06: qty 1

## 2018-05-06 NOTE — ED Provider Notes (Signed)
[facility] EMERGENCY DEPARTMENT Provider Note   CSN: [id number] Arrival date & time: 05/06/18  5053     History   Chief Complaint Chief Complaint  Patient presents with  . Weakness    HPI Arthur Howard is a 53 y.o. male.  He is primarily Spanish-speaking.  Translation assistance by his sisters.  He has a history of diabetes and possibly high blood pressure.  He had difficulty with the right side of his body starting around 630 or 7:00 last night.  His sister said she talked to him and she was having difficulty understanding him on the phone.  Today he was having difficulty walking feeling weak on the right and had difficulty brushing his teeth.  He is never had this before.  He denies any headache blurry vision double vision chest pain shortness of breath abdominal pain diarrhea.  It sounds as his symptoms are a little bit better today than yesterday.  The history is provided by the patient. The history is limited by a language barrier. A language interpreter was used (family).  Weakness  Severity:  Moderate Onset quality:  Sudden Duration:  15 hours Timing:  Constant Progression:  Improving Chronicity:  New Context: not alcohol use and not recent infection   Relieved by:  None tried Worsened by:  Nothing Ineffective treatments:  None tried Associated symptoms: aphasia, difficulty walking, extremity numbness and sensory-motor deficit   Associated symptoms: no abdominal pain, no chest pain, no cough, no dysphagia, no dysuria, no falls, no fever, no headaches, no hematochezia, no lethargy, no loss of consciousness, no nausea, no shortness of breath and no vision change     Past Medical History:  Diagnosis Date  . Diabetes mellitus without complication ([place])   . Hyperlipidemia     Patient Active Problem List   Diagnosis Date Noted  . Hyperlipidemia 05/30/2017  . T2DM (type 2 diabetes mellitus) ([place]) 08/12/2014    Past Surgical History:  Procedure  Laterality Date  . ANKLE SURGERY          Home Medications    Prior to Admission medications   Medication Sig Start Date End Date Taking? Authorizing Provider  atorvastatin (LIPITOR) 40 MG tablet Take 1 tablet (40 mg total) by mouth daily. 05/30/17   Rutherford Guys, MD  blood glucose meter kit and supplies KIT Dispense based on patient and insurance preference. Use up to four times daily as directed. (FOR ICD-9 250.00, 250.01). Patient not taking: Reported on 05/30/2017 09/20/16   Tenna Delaine D, PA-C  clotrimazole-betamethasone (LOTRISONE) cream Apply 1 application topically 2 (two) times daily. Patient not taking: Reported on 05/30/2017 09/18/16   Tenna Delaine D, PA-C  glipiZIDE (GLUCOTROL) 5 MG tablet Take 1 tablet (5 mg total) by mouth 2 (two) times daily before a meal. 05/30/17   Rutherford Guys, MD  metFORMIN (GLUCOPHAGE) 1000 MG tablet Take 1 tablet (1,000 mg total) by mouth 2 (two) times daily with a meal. 05/30/17   Rutherford Guys, MD    Family History Family History  Problem Relation Age of Onset  . Diabetes Sister   . Diabetes Brother     Social History Social History   Tobacco Use  . Smoking status: Current Some Day Smoker  . Smokeless tobacco: Never Used  Substance Use Topics  . Alcohol use: No    Alcohol/week: 0.0 standard drinks  . Drug use: No     Allergies   Patient has no known allergies.   Review  of Systems Review of Systems  Constitutional: Negative for fever.  HENT: Negative for sore throat.   Eyes: Negative for visual disturbance.  Respiratory: Negative for cough and shortness of breath.   Cardiovascular: Negative for chest pain.  Gastrointestinal: Negative for abdominal pain, dysphagia, hematochezia and nausea.  Genitourinary: Negative for dysuria.  Musculoskeletal: Negative for falls and neck pain.  Skin: Negative for rash.  Neurological: Positive for facial asymmetry, speech difficulty, weakness and numbness. Negative for loss of  consciousness and headaches.     Physical Exam Updated Vital Signs BP (!) 174/86   Pulse 63   Temp 97.7 F (36.5 C) (Oral)   Resp (!) 6   SpO2 98%   Physical Exam Vitals signs and nursing note reviewed.  Constitutional:      Appearance: He is well-developed.  HENT:     Head: Normocephalic and atraumatic.  Eyes:     Conjunctiva/sclera: Conjunctivae normal.  Neck:     Musculoskeletal: Neck supple.  Cardiovascular:     Rate and Rhythm: Normal rate and regular rhythm.     Heart sounds: No murmur.  Pulmonary:     Effort: Pulmonary effort is normal. No respiratory distress.     Breath sounds: Normal breath sounds.  Abdominal:     Palpations: Abdomen is soft.     Tenderness: There is no abdominal tenderness.  Musculoskeletal:        General: No deformity or signs of injury.     Right lower leg: No edema.     Left lower leg: No edema.  Skin:    General: Skin is warm and dry.     Capillary Refill: Capillary refill takes less than 2 seconds.  Neurological:     Mental Status: He is alert and oriented to person, place, and time.     Cranial Nerves: Cranial nerve deficit present.     Sensory: No sensory deficit.     Motor: Weakness present.     Comments: Patient is awake alert and oriented.  He is a little right-sided facial asymmetry.  Tongue is a little deviation to the right.  Speech is fluent although in [place] and family says it is a little bit off.  He feels subjectively a little weak in the right arm although on my exam strength is maybe 4+ out of 5.  5 out of 5 lower extremity.  Sensory is intact to light touch.  No pronator drift.      ED Treatments / Results  Labs (all labs ordered are listed, but only abnormal results are displayed) Labs Reviewed  COMPREHENSIVE METABOLIC PANEL - Abnormal; Notable for the following components:      Result Value   Glucose, Bld 297 (*)    All other components within normal limits  GLUCOSE, CAPILLARY - Abnormal; Notable for the  following components:   Glucose-Capillary 241 (*)    All other components within normal limits  CBG MONITORING, ED - Abnormal; Notable for the following components:   Glucose-Capillary 215 (*)    All other components within normal limits  PROTIME-INR  APTT  CBC  DIFFERENTIAL  HEMOGLOBIN A1C  HIV ANTIBODY (ROUTINE TESTING W REFLEX)  RAPID URINE DRUG SCREEN, HOSP PERFORMED  LIPID PANEL  TSH    EKG EKG Interpretation  Date/Time:  Thursday May 06 2018 07:25:07 EST Ventricular Rate:  57 PR Interval:  146 QRS Duration: 80 QT Interval:  398 QTC Calculation: 387 R Axis:   76 Text Interpretation:  Sinus bradycardia Otherwise normal  ECG no prior to compare with Confirmed by Aletta Edouard [phone]) on 05/06/2018 8:50:57 AM   Radiology Ct Angio Head W Or Wo Contrast  Result Date: 05/06/2018 CLINICAL DATA:  Right-sided weakness and speech disturbance beginning 1830 hours yesterday. EXAM: CT ANGIOGRAPHY HEAD AND NECK TECHNIQUE: Multidetector CT imaging of the head and neck was performed using the standard protocol during bolus administration of intravenous contrast. Multiplanar CT image reconstructions and MIPs were obtained to evaluate the vascular anatomy. Carotid stenosis measurements (when applicable) are obtained utilizing NASCET criteria, using the distal internal carotid diameter as the denominator. CONTRAST:  154m ISOVUE-370 IOPAMIDOL (ISOVUE-370) INJECTION 76% COMPARISON:  Head CT same day FINDINGS: CTA NECK FINDINGS Aortic arch: Very minimal atherosclerotic calcification. No meaningful atherosclerosis. No aneurysm or dissection. Branching pattern is normal. Right carotid system: Common carotid artery widely patent to the bifurcation. The carotid bifurcation is normal without atherosclerotic disease. Cervical ICA is widely patent to the skull base. Left carotid system: Common carotid artery widely patent to the bifurcation. The carotid bifurcation is normal without atherosclerotic  disease. Cervical ICA is normal. Vertebral arteries: Both vertebral artery origins are widely patent. Both vertebral arteries are widely patent through the cervical region to the foramen magnum. Skeleton: Minimal mid cervical spondylosis. Other neck: No mass or lymphadenopathy. Upper chest: Normal Review of the MIP images confirms the above findings CTA HEAD FINDINGS Anterior circulation: Both internal carotid arteries are widely patent through the skull base and siphon regions. The anterior and middle cerebral vessels are patent without proximal stenosis, aneurysm or vascular malformation. Posterior circulation: Both vertebral arteries are widely patent through the foramen magnum to the basilar. No basilar stenosis. Posterior circulation branch vessels are normal. Venous sinuses: Patent and normal. Anatomic variants: None Delayed phase: No abnormal enhancement. Review of the MIP images confirms the above findings IMPRESSION: Normal examination.  No brachiocephalic vascular disease. Electronically Signed   By: MNelson ChimesM.D.   On: 05/06/2018 11:28   Ct Head Wo Contrast  Result Date: 05/06/2018 CLINICAL DATA:  Right-sided weakness and dysarthria EXAM: CT HEAD WITHOUT CONTRAST TECHNIQUE: Contiguous axial images were obtained from the base of the skull through the vertex without intravenous contrast. COMPARISON:  None. FINDINGS: Brain: Ventricles are normal in size and configuration. There is no intracranial mass, hemorrhage, extra-axial fluid collection, or midline shift. There is focal decreased attenuation along a portion of the genu and posterior limb of the right internal capsule consistent with age uncertain and possible recent small infarct in this area. Elsewhere brain parenchyma appears unremarkable. Vascular: No hyperdense vessel. There is calcification in each carotid siphon region. Skull: Bony calvarium appears intact. Sinuses/Orbits: There is mucosal thickening involving multiple ethmoid air cells  bilaterally. There is a small retention cyst in the superomedial right maxillary antrum. Orbits appear symmetric bilaterally. Other: Mastoid air cells are clear. IMPRESSION: Small age uncertain infarct involving a portion of the genu and posterior limb of the right internal capsule. Brain parenchyma elsewhere appears unremarkable. No evident mass or hemorrhage. There are foci of arterial vascular calcification. There are foci of paranasal sinus disease. Electronically Signed   By: WLowella GripIII M.D.   On: 05/06/2018 08:33   Ct Angio Neck W Or Wo Contrast  Result Date: 05/06/2018 CLINICAL DATA:  Right-sided weakness and speech disturbance beginning 1830 hours yesterday. EXAM: CT ANGIOGRAPHY HEAD AND NECK TECHNIQUE: Multidetector CT imaging of the head and neck was performed using the standard protocol during bolus administration of intravenous contrast. Multiplanar CT image reconstructions  and MIPs were obtained to evaluate the vascular anatomy. Carotid stenosis measurements (when applicable) are obtained utilizing NASCET criteria, using the distal internal carotid diameter as the denominator. CONTRAST:  18m ISOVUE-370 IOPAMIDOL (ISOVUE-370) INJECTION 76% COMPARISON:  Head CT same day FINDINGS: CTA NECK FINDINGS Aortic arch: Very minimal atherosclerotic calcification. No meaningful atherosclerosis. No aneurysm or dissection. Branching pattern is normal. Right carotid system: Common carotid artery widely patent to the bifurcation. The carotid bifurcation is normal without atherosclerotic disease. Cervical ICA is widely patent to the skull base. Left carotid system: Common carotid artery widely patent to the bifurcation. The carotid bifurcation is normal without atherosclerotic disease. Cervical ICA is normal. Vertebral arteries: Both vertebral artery origins are widely patent. Both vertebral arteries are widely patent through the cervical region to the foramen magnum. Skeleton: Minimal mid cervical  spondylosis. Other neck: No mass or lymphadenopathy. Upper chest: Normal Review of the MIP images confirms the above findings CTA HEAD FINDINGS Anterior circulation: Both internal carotid arteries are widely patent through the skull base and siphon regions. The anterior and middle cerebral vessels are patent without proximal stenosis, aneurysm or vascular malformation. Posterior circulation: Both vertebral arteries are widely patent through the foramen magnum to the basilar. No basilar stenosis. Posterior circulation branch vessels are normal. Venous sinuses: Patent and normal. Anatomic variants: None Delayed phase: No abnormal enhancement. Review of the MIP images confirms the above findings IMPRESSION: Normal examination.  No brachiocephalic vascular disease. Electronically Signed   By: MNelson ChimesM.D.   On: 05/06/2018 11:28   Mr Brain Wo Contrast  Result Date: 05/06/2018 CLINICAL DATA:  Right arm and leg weakness and balance disturbance. Negative CT evaluation. EXAM: MRI HEAD WITHOUT CONTRAST TECHNIQUE: Multiplanar, multiecho pulse sequences of the brain and surrounding structures were obtained without intravenous contrast. COMPARISON:  CT study same day FINDINGS: Brain: Diffusion imaging shows acute infarction in the left ventral inferior pons. No other acute finding. Elsewhere, the brainstem and cerebellum are normal. There is an old small vessel infarction in the right basal ganglia/radiating white matter tracts. There is minimal small vessel ischemic change of the hemispheric white matter. No large vessel territory infarction. No mass lesion, hemorrhage, hydrocephalus or extra-axial collection. Vascular: Major vessels at the base of the brain show flow. Skull and upper cervical spine: Negative Sinuses/Orbits: Clear/normal Other: None IMPRESSION: Small acute infarction of the left inferior ventral pons. No sign of hemorrhage or mass effect. Minimal chronic small-vessel change of the hemispheric white  matter elsewhere as described above. Small old lacunar infarction right basal ganglia/radiating white matter tracts. Electronically Signed   By: MNelson ChimesM.D.   On: 05/06/2018 12:51    Procedures Procedures (including critical care time)  Medications Ordered in ED Medications - No data to display   Initial Impression / Assessment and Plan / ED Course  I have reviewed the triage vital signs and the nursing notes.  Pertinent labs & imaging results that were available during my care of the patient were reviewed by me and considered in my medical decision making (see chart for details).  Clinical Course as of May 07 1839  Thu May 06, 2018  0917 Patient last known well 6:30 PM yesterday with right-sided deficits.  They potentially are a little bit better this morning when he presented.  He has some subtle findings of some right-sided weakness in the face and arm.  CT does show a finding in the right genu.  Neurology consult is been placed.   [MB]  0924 Discussed with Dr. Lorraine Lax from neurology.  He is recommending a CTA head and neck and an MRI and admission for stroke work-up.  He will see in consult.   [MB]    Clinical Course User Index [MB] Hayden Rasmussen, MD     Final Clinical Impressions(s) / ED Diagnoses   Final diagnoses:  Cerebrovascular accident (CVA), unspecified mechanism [facility])    ED Discharge Orders    None       Hayden Rasmussen, MD 05/06/18 1843

## 2018-05-06 NOTE — ED Notes (Signed)
Attempted report x1. 

## 2018-05-06 NOTE — H&P (Signed)
History and Physical    Arthur Howard PZW:[id number] DOB: September 30, 1965 DOA: 05/06/2018  PCP:  [facility] Consultants:  None Patient coming from:  Home - lives with wife, children, grandchildren; NOK: Daughters, [phone]  Chief Complaint: weakness  HPI: Arthur Howard is a 53 y.o. male with medical history significant of DM and HLD presenting with weakness. Yesterday, about 630pm, he noticed RLE and RUE numbness and weakness.  This AM, he was getting up for work and he felt very weak and unable to brush his teeth.  He went to work and noticed his right arm and leg were still weak.  Family has noticed some dysarthria "like his tongue were stuck or something."  Left facial droop.  No dysphagia.  Mild left parietal headache.  He is still feeling weak in his right hand > arm and leg.  He has been complaining about dental pain and there is concern for an infection there too.  No fever.   ED Course:  Trouble with right body last night.  Speech difficulty.  This AM, trouble walking and brushing teeth.  Subtle deficits - speech, facial droop, trace weakness.  Neurology is aware. CT shows possible infarct.  Needs stroke evaluation.  Review of Systems: As per HPI; otherwise review of systems reviewed and negative.   Ambulatory Status:  Ambulates without assistance  Past Medical History:  Diagnosis Date  . Diabetes mellitus without complication ([place])   . Hyperlipidemia     Past Surgical History:  Procedure Laterality Date  . ANKLE SURGERY      Social History   Socioeconomic History  . Marital status: Married    Spouse name: Not on file  . Number of children: Not on file  . Years of education: Not on file  . Highest education level: Not on file  Occupational History  . Occupation: Sales executive    Comment: [employer]  Social Needs  . Financial resource strain: Not on file  . Food insecurity:    Worry: Not on file    Inability: Not on file  . Transportation needs:    Medical:  Not on file    Non-medical: Not on file  Tobacco Use  . Smoking status: Current Some Day Smoker  . Smokeless tobacco: Never Used  . Tobacco comment: never a daily smoker  Substance and Sexual Activity  . Alcohol use: No    Alcohol/week: 0.0 standard drinks  . Drug use: No  . Sexual activity: Not on file  Lifestyle  . Physical activity:    Days per week: Not on file    Minutes per session: Not on file  . Stress: Not on file  Relationships  . Social connections:    Talks on phone: Not on file    Gets together: Not on file    Attends religious service: Not on file    Active member of club or organization: Not on file    Attends meetings of clubs or organizations: Not on file    Relationship status: Not on file  . Intimate partner violence:    Fear of current or ex partner: Not on file    Emotionally abused: Not on file    Physically abused: Not on file    Forced sexual activity: Not on file  Other Topics Concern  . Not on file  Social History Narrative   Works with Sales executive. Lives in [place]. Moved from [place] 35 years ago. He is married. Has 7 children (all girls).  No Known Allergies  Family History  Problem Relation Age of Onset  . Diabetes Sister   . Diabetes Brother   . CVA Neg Hx     Prior to Admission medications   Medication Sig Start Date End Date Taking? Authorizing Provider  acetaminophen (TYLENOL) 500 MG tablet Take 500 mg by mouth every 6 (six) hours as needed for mild pain.   Yes [provider]  atorvastatin (LIPITOR) 40 MG tablet Take 1 tablet (40 mg total) by mouth daily. Patient not taking: Reported on 05/06/2018 05/30/17   Rutherford Guys, MD  blood glucose meter kit and supplies KIT Dispense based on patient and insurance preference. Use up to four times daily as directed. (FOR ICD-9 250.00, 250.01). Patient not taking: Reported on 05/30/2017 09/20/16   Tenna Delaine D, PA-C  clotrimazole-betamethasone (LOTRISONE) cream Apply 1  application topically 2 (two) times daily. Patient not taking: Reported on 05/30/2017 09/18/16   Tenna Delaine D, PA-C  glipiZIDE (GLUCOTROL) 5 MG tablet Take 1 tablet (5 mg total) by mouth 2 (two) times daily before a meal. Patient not taking: Reported on 05/06/2018 05/30/17   Rutherford Guys, MD  metFORMIN (GLUCOPHAGE) 1000 MG tablet Take 1 tablet (1,000 mg total) by mouth 2 (two) times daily with a meal. Patient not taking: Reported on 05/06/2018 05/30/17   Rutherford Guys, MD    Physical Exam: Vitals:   05/06/18 1130 05/06/18 1145 05/06/18 1415 05/06/18 1550  BP: 136/63 (!) 132/58 124/80 (!) 142/77  Pulse: (!) 52 (!) 56  (!) 51  Resp: _0 Temp:    98.2 F (36.8 C)  TempSrc:      SpO2: 98% 98%  98%     . General:  Appears calm and comfortable and is NAD; spanish speaking . Eyes:  PERRL, EOMI, normal lids, iris . ENT:  grossly normal hearing, lips & tongue, mmm; right tongue deviation . Neck:  no LAD, masses or thyromegaly; no carotid bruits . Cardiovascular:  RR with bradycardia to 50, no m/r/g. No LE edema.  Marland Kitchen Respiratory:   CTA bilaterally with no wheezes/rales/rhonchi.  Normal respiratory effort. . Abdomen:  soft, NT, ND, NABS . Back:   normal alignment, no CVAT . Skin:  no rash or induration seen on limited exam . Musculoskeletal: RUE and RLE weakness, 4-5/5, good ROM, no bony abnormality . Psychiatric:  flat mood and affect, speech fluent and appropriate but in Spanish and family reports it is different from his norm, AOx3 . Neurologic:  CN 2-12 grossly intact other than mild left facial droop at mouth and right tongue deviation, moves all extremities in coordinated fashion, sensation intact    Radiological Exams on Admission: Ct Angio Head W Or Wo Contrast  Result Date: 05/06/2018 CLINICAL DATA:  Right-sided weakness and speech disturbance beginning 1830 hours yesterday. EXAM: CT ANGIOGRAPHY HEAD AND NECK TECHNIQUE: Multidetector CT imaging of the head and neck  was performed using the standard protocol during bolus administration of intravenous contrast. Multiplanar CT image reconstructions and MIPs were obtained to evaluate the vascular anatomy. Carotid stenosis measurements (when applicable) are obtained utilizing NASCET criteria, using the distal internal carotid diameter as the denominator. CONTRAST:  121m ISOVUE-370 IOPAMIDOL (ISOVUE-370) INJECTION 76% COMPARISON:  Head CT same day FINDINGS: CTA NECK FINDINGS Aortic arch: Very minimal atherosclerotic calcification. No meaningful atherosclerosis. No aneurysm or dissection. Branching pattern is normal. Right carotid system: Common carotid artery widely patent to the bifurcation. The carotid bifurcation is normal without  atherosclerotic disease. Cervical ICA is widely patent to the skull base. Left carotid system: Common carotid artery widely patent to the bifurcation. The carotid bifurcation is normal without atherosclerotic disease. Cervical ICA is normal. Vertebral arteries: Both vertebral artery origins are widely patent. Both vertebral arteries are widely patent through the cervical region to the foramen magnum. Skeleton: Minimal mid cervical spondylosis. Other neck: No mass or lymphadenopathy. Upper chest: Normal Review of the MIP images confirms the above findings CTA HEAD FINDINGS Anterior circulation: Both internal carotid arteries are widely patent through the skull base and siphon regions. The anterior and middle cerebral vessels are patent without proximal stenosis, aneurysm or vascular malformation. Posterior circulation: Both vertebral arteries are widely patent through the foramen magnum to the basilar. No basilar stenosis. Posterior circulation branch vessels are normal. Venous sinuses: Patent and normal. Anatomic variants: None Delayed phase: No abnormal enhancement. Review of the MIP images confirms the above findings IMPRESSION: Normal examination.  No brachiocephalic vascular disease. Electronically  Signed   By: Nelson Chimes M.D.   On: 05/06/2018 11:28   Ct Head Wo Contrast  Result Date: 05/06/2018 CLINICAL DATA:  Right-sided weakness and dysarthria EXAM: CT HEAD WITHOUT CONTRAST TECHNIQUE: Contiguous axial images were obtained from the base of the skull through the vertex without intravenous contrast. COMPARISON:  None. FINDINGS: Brain: Ventricles are normal in size and configuration. There is no intracranial mass, hemorrhage, extra-axial fluid collection, or midline shift. There is focal decreased attenuation along a portion of the genu and posterior limb of the right internal capsule consistent with age uncertain and possible recent small infarct in this area. Elsewhere brain parenchyma appears unremarkable. Vascular: No hyperdense vessel. There is calcification in each carotid siphon region. Skull: Bony calvarium appears intact. Sinuses/Orbits: There is mucosal thickening involving multiple ethmoid air cells bilaterally. There is a small retention cyst in the superomedial right maxillary antrum. Orbits appear symmetric bilaterally. Other: Mastoid air cells are clear. IMPRESSION: Small age uncertain infarct involving a portion of the genu and posterior limb of the right internal capsule. Brain parenchyma elsewhere appears unremarkable. No evident mass or hemorrhage. There are foci of arterial vascular calcification. There are foci of paranasal sinus disease. Electronically Signed   By: Lowella Grip III M.D.   On: 05/06/2018 08:33   Ct Angio Neck W Or Wo Contrast  Result Date: 05/06/2018 CLINICAL DATA:  Right-sided weakness and speech disturbance beginning 1830 hours yesterday. EXAM: CT ANGIOGRAPHY HEAD AND NECK TECHNIQUE: Multidetector CT imaging of the head and neck was performed using the standard protocol during bolus administration of intravenous contrast. Multiplanar CT image reconstructions and MIPs were obtained to evaluate the vascular anatomy. Carotid stenosis measurements (when  applicable) are obtained utilizing NASCET criteria, using the distal internal carotid diameter as the denominator. CONTRAST:  176m ISOVUE-370 IOPAMIDOL (ISOVUE-370) INJECTION 76% COMPARISON:  Head CT same day FINDINGS: CTA NECK FINDINGS Aortic arch: Very minimal atherosclerotic calcification. No meaningful atherosclerosis. No aneurysm or dissection. Branching pattern is normal. Right carotid system: Common carotid artery widely patent to the bifurcation. The carotid bifurcation is normal without atherosclerotic disease. Cervical ICA is widely patent to the skull base. Left carotid system: Common carotid artery widely patent to the bifurcation. The carotid bifurcation is normal without atherosclerotic disease. Cervical ICA is normal. Vertebral arteries: Both vertebral artery origins are widely patent. Both vertebral arteries are widely patent through the cervical region to the foramen magnum. Skeleton: Minimal mid cervical spondylosis. Other neck: No mass or lymphadenopathy. Upper chest: Normal Review  of the MIP images confirms the above findings CTA HEAD FINDINGS Anterior circulation: Both internal carotid arteries are widely patent through the skull base and siphon regions. The anterior and middle cerebral vessels are patent without proximal stenosis, aneurysm or vascular malformation. Posterior circulation: Both vertebral arteries are widely patent through the foramen magnum to the basilar. No basilar stenosis. Posterior circulation branch vessels are normal. Venous sinuses: Patent and normal. Anatomic variants: None Delayed phase: No abnormal enhancement. Review of the MIP images confirms the above findings IMPRESSION: Normal examination.  No brachiocephalic vascular disease. Electronically Signed   By: Nelson Chimes M.D.   On: 05/06/2018 11:28   Mr Brain Wo Contrast  Result Date: 05/06/2018 CLINICAL DATA:  Right arm and leg weakness and balance disturbance. Negative CT evaluation. EXAM: MRI HEAD WITHOUT  CONTRAST TECHNIQUE: Multiplanar, multiecho pulse sequences of the brain and surrounding structures were obtained without intravenous contrast. COMPARISON:  CT study same day FINDINGS: Brain: Diffusion imaging shows acute infarction in the left ventral inferior pons. No other acute finding. Elsewhere, the brainstem and cerebellum are normal. There is an old small vessel infarction in the right basal ganglia/radiating white matter tracts. There is minimal small vessel ischemic change of the hemispheric white matter. No large vessel territory infarction. No mass lesion, hemorrhage, hydrocephalus or extra-axial collection. Vascular: Major vessels at the base of the brain show flow. Skull and upper cervical spine: Negative Sinuses/Orbits: Clear/normal Other: None IMPRESSION: Small acute infarction of the left inferior ventral pons. No sign of hemorrhage or mass effect. Minimal chronic small-vessel change of the hemispheric white matter elsewhere as described above. Small old lacunar infarction right basal ganglia/radiating white matter tracts. Electronically Signed   By: Nelson Chimes M.D.   On: 05/06/2018 12:51    EKG: Independently reviewed.  Sinus bradycardia with rate 57; nonspecific ST changes with no evidence of acute ischemia   Labs on Admission: I have personally reviewed the available labs and imaging studies at the time of the admission.  Pertinent labs:   Glucose 297, 215, 241 Normal CBC INR 0.99   Assessment/Plan Principal Problem:   CVA (cerebral vascular accident) ([place]) Active Problems:   Hyperlipidemia   Diabetes mellitus without complication ([place])   CVA -Patient presenting with right-sided weakness and left facial droop with dysarthria starting last evening -Based on description of symptoms and PE findings still present, this is highly concerning for CVA -Will admit for further CVA evaluation -Telemetry monitoring -MRI -CTA head and neck -Echo -Risk stratification with FLP,  A1c; will also check TSH and UDS -ASA daily -Neurology consult -PT/OT/ST/Nutrition Consults  HTN -Allow permissive HTN for now -Treat BP only if >220/120, and then with goal of 15% reduction -He was previously noted to have elevated BPs but has never been diagnosed with or treated for HTN   HLD -Check FLP -Start Lipitor 80 mg daily- previously prescribed 40 mg daily but has not been taking   DM -Check A1c -Has not been taking home medications (Glucotrol, Glucophage) -Will order moderate-scale SSI     DVT prophylaxis:  Lovenox  Code Status: Full - confirmed with patient/family Family Communication: Daughters present throughout evaluation Disposition Plan:  Home once clinically improved Consults called: Neurology; PT/OT/ST/Nutrition  Admission status: Admit - It is my clinical opinion that admission to INPATIENT is reasonable and necessary because of the expectation that this patient will require hospital care that crosses at least 2 midnights to treat this condition based on the medical complexity of the problems presented.  Given the aforementioned information, the predictability of an adverse outcome is felt to be significant.    Karmen Bongo MD [facility]   How to contact the [facility] Attending or Consulting provider [place] or covering provider during after hours [place], for this patient?  1. Check the care team in [facility] and look for a) attending/consulting [facility] provider listed and b) the [facility] team listed 2. Log into [email or web address] and use [place]'s universal password to access. If you do not have the password, please contact the hospital operator. 3. Locate the [facility] provider you are looking for under [facility] and page to a number that you can be directly reached. 4. If you still have difficulty reaching the provider, please page the [facility] (Director on Call) for the Hospitalists listed on amion for assistance.   05/06/2018, 5:51 PM

## 2018-05-06 NOTE — ED Notes (Signed)
Pt transported to MRI 

## 2018-05-06 NOTE — ED Triage Notes (Signed)
Pt endorses right side weakness and difficulty speaking that began yesterday evening. LSN [id number] last night. Pt no longer having difficulty with right sided but possibly still having some speech difficulty. Pt is spanish speaking so assessment on speech is difficult. No aphasia, unilateral weakness, numbness, tingling or ha.

## 2018-05-06 NOTE — ED Notes (Signed)
Karen(SR)-Lunch Tray Ordered @ 1134-[address] [place], RN-paged by [employer]

## 2018-05-06 NOTE — Consult Note (Addendum)
Neurology Consultation  Reason for Consult: Stroke Referring Physician: Lorin Mercy  CC: Stroke  History is obtained from: Family  HPI: Arthur Howard is a 53 y.o. male hyperlipidemia and diabetes.  Patient does not speak English so the majority of the history was obtained from the wife.  Per wife at approximately 1830 hrs. on 05/05/2018 patient noted that his right arm and leg were weak and he was having difficulty with his balance.  This a.m. patient got up and was getting ready for work but felt weak and was unable to brush his teeth.  He then went to work and noticed that his right arm and leg were still weak.  The family also noticed that he is having some slurred speech.  They also noticed a right facial droop.  For these reasons patient came to the ED.  CT was obtained showing a small age uncertain infarct involving a portion of the January and posterior limb of the right internal capsule.  Otherwise patient CT was negative while in the ED also a CT Angie of head and neck were obtained showed no large vessel occlusion and normal examination.  At this current time patient feels he is getting stronger in both his right arm and leg and states that he has no difference in sensation.    LKW: 1830 hrs. on 2/12/12020 tpa given?: no, out of window Premorbid modified Rankin scale (mRS): 0 NIH stroke score: 2 for dysarthria and right facial droop   ROS: A 14 point ROS was performed and is negative except as noted in the HPI.   Past Medical History:  Diagnosis Date  . Diabetes mellitus without complication ([place])   . Hyperlipidemia     Family History  Problem Relation Age of Onset  . Diabetes Sister   . Diabetes Brother   . CVA Neg Hx    Social History:   reports that he has been smoking. He has never used smokeless tobacco. He reports that he does not drink alcohol or use drugs.  Medications  Current Facility-Administered Medications:  .  0.9 %  sodium chloride infusion, , Intravenous,  Continuous, Karmen Bongo, MD, Last Rate: 50 mL/hr at 05/06/18 1137 .  acetaminophen (TYLENOL) tablet 650 mg, 650 mg, Oral, Q4H PRN **OR** acetaminophen (TYLENOL) solution 650 mg, 650 mg, Per Tube, Q4H PRN **OR** acetaminophen (TYLENOL) suppository 650 mg, 650 mg, Rectal, Q4H PRN, Karmen Bongo, MD .  aspirin suppository 300 mg, 300 mg, Rectal, Daily **OR** aspirin tablet 325 mg, 325 mg, Oral, Daily, Karmen Bongo, MD, 325 mg at 05/06/18 1136 .  atorvastatin (LIPITOR) tablet 80 mg, 80 mg, Oral, Daily, Karmen Bongo, MD, 80 mg at 05/06/18 1136 .  enoxaparin (LOVENOX) injection 40 mg, 40 mg, Subcutaneous, Q24H, Karmen Bongo, MD .  insulin aspart (novoLOG) injection 0-15 Units, 0-15 Units, Subcutaneous, TID WC, Karmen Bongo, MD .  insulin aspart (novoLOG) injection 0-5 Units, 0-5 Units, Subcutaneous, QHS, Karmen Bongo, MD .  LORazepam (ATIVAN) injection 1 mg, 1 mg, Intravenous, Once, Karmen Bongo, MD .  senna-docusate (Senokot-S) tablet 1 tablet, 1 tablet, Oral, QHS PRN, Karmen Bongo, MD  Current Outpatient Medications:  .  acetaminophen (TYLENOL) 500 MG tablet, Take 500 mg by mouth every 6 (six) hours as needed for mild pain., Disp: , Rfl:  .  atorvastatin (LIPITOR) 40 MG tablet, Take 1 tablet (40 mg total) by mouth daily. (Patient not taking: Reported on 05/06/2018), Disp: 90 tablet, Rfl: 1 .  blood glucose meter kit and supplies KIT, Dispense  based on patient and insurance preference. Use up to four times daily as directed. (FOR ICD-9 250.00, 250.01). (Patient not taking: Reported on 05/30/2017), Disp: 1 each, Rfl: 0 .  glipiZIDE (GLUCOTROL) 5 MG tablet, Take 1 tablet (5 mg total) by mouth 2 (two) times daily before a meal. (Patient not taking: Reported on 05/06/2018), Disp: 90 tablet, Rfl: 1 .  metFORMIN (GLUCOPHAGE) 1000 MG tablet, Take 1 tablet (1,000 mg total) by mouth 2 (two) times daily with a meal. (Patient not taking: Reported on 05/06/2018), Disp: 180 tablet, Rfl:  1   Exam: Current vital signs: BP 130/69   Pulse (!) 50   Temp 97.7 F (36.5 C) (Oral)   Resp 14   SpO2 96%  Vital signs in last 24 hours: Temp:  [97.7 F (36.5 C)] 97.7 F (36.5 C) (02/13 0729) Pulse Rate:  [49-63] 50 (02/13 1045) Resp:  [6-22] 14 (02/13 1045) BP: (130-175)/(69-86) 130/69 (02/13 1045) SpO2:  [96 %-98 %] 96 % (02/13 1045)  Physical Exam  Constitutional: Appears well-developed and well-nourished.  Psych: Affect appropriate to situation Eyes: No scleral injection HENT: No OP obstrucion Head: Normocephalic.  Cardiovascular: Normal rate and regular rhythm.  Respiratory: Effort normal, non-labored breathing GI: Soft.  No distension. There is no tenderness.  Skin: WDI  Neuro: Mental Status: Patient is awake, alert, oriented to person, place, month, year, and situation. Given patient does not speak English however he was able to coherently give history to family No signs of aphasia or neglect Cranial Nerves: II: Visual Fields are full.  III,IV, VI: EOMI without ptosis or diploplia. Pupils are equal, round, and reactive to light.   V: Facial sensation is symmetric to temperature VII: Lower right facial droop VIII: hearing is intact to voice X: Uvula elevates symmetrically XI: Shoulder shrug is symmetric. XII: tongue is midline without atrophy or fasciculations.  Motor: Right arm is 4/5 compared to left arm which is 5/5.  Right leg and left leg proximally is 5/5 however dorsiflexion on the right was 4/5 Sensory: Sensation is symmetric to light touch and temperature in the arms and legs. Deep Tendon Reflexes: 2+ and symmetric in the biceps and patellae.  Plantars: Toes are downgoing bilaterally.  Cerebellar: FNF and HKS are intact bilaterally  Labs I have reviewed labs in epic and the results pertinent to this consultation are:   CBC    Component Value Date/Time   WBC 8.1 05/06/2018 0741   RBC 5.11 05/06/2018 0741   HGB 15.6 05/06/2018 0741    HGB 16.2 09/20/2016 0926   HCT 45.6 05/06/2018 0741   HCT 47.5 09/20/2016 0926   PLT 334 05/06/2018 0741   PLT 328 09/20/2016 0926   MCV 89.2 05/06/2018 0741   MCV 92 09/20/2016 0926   MCH 30.5 05/06/2018 0741   MCHC 34.2 05/06/2018 0741   RDW 11.9 05/06/2018 0741   RDW 12.9 09/20/2016 0926   LYMPHSABS 3.7 05/06/2018 0741   LYMPHSABS 2.1 09/20/2016 0926   MONOABS 0.7 05/06/2018 0741   EOSABS 0.2 05/06/2018 0741   EOSABS 0.0 09/20/2016 0926   BASOSABS 0.0 05/06/2018 0741   BASOSABS 0.0 09/20/2016 0926    CMP     Component Value Date/Time   NA 135 05/06/2018 0741   NA 139 05/30/2017 1157   K 4.2 05/06/2018 0741   CL 99 05/06/2018 0741   CO2 25 05/06/2018 0741   GLUCOSE 297 (H) 05/06/2018 0741   BUN 15 05/06/2018 0741   BUN 13 05/30/2017 1157  CREATININE 0.80 05/06/2018 0741   CREATININE 0.88 11/10/2015 0846   CALCIUM 9.3 05/06/2018 0741   PROT 6.5 05/06/2018 0741   PROT 6.7 05/30/2017 1157   ALBUMIN 3.6 05/06/2018 0741   ALBUMIN 4.1 05/30/2017 1157   AST 20 05/06/2018 0741   ALT 23 05/06/2018 0741   ALKPHOS 57 05/06/2018 0741   BILITOT 0.9 05/06/2018 0741   BILITOT 0.4 05/30/2017 1157   GFRNONAA >60 05/06/2018 0741   GFRAA >60 05/06/2018 0741    Lipid Panel     Component Value Date/Time   CHOL 217 (H) 05/30/2017 1157   TRIG 292 (H) 05/30/2017 1157   HDL 47 05/30/2017 1157   CHOLHDL 4.6 05/30/2017 1157   CHOLHDL 5.1 (H) 11/10/2015 0846   VLDL 37 (H) 11/10/2015 0846   LDLCALC 112 (H) 05/30/2017 1157     Imaging I have reviewed the images obtained:  CT-scan of the brain-Small age uncertain infarct involving a portion of the genu and posterior limb of the right internal capsule  MRI examination of the brain- Pending  CTA of head and neck- normal  Etta Quill PA-C [facility] [phone]  M-F  (9:00 am- 5:00 PM)  05/06/2018, 11:38 AM     Assessment: 53 year old male who does not take aspirin with sudden onset of right facial droop  and right arm and leg weakness along with possible dysarthria.   Acute ischemic stroke    Recommend # MRI of the brain without contrast #Transthoracic Echo,   # Start patient on ASA 33m daily and plavix 764mdaily  #continue Atorvastatin but increase to 80 mg/other high intensity statin # BP goal: permissive HTN upto 220/120 mmHg # HBAIC  # Telemetry monitoring # Frequent neuro checks # NPO until passes stroke swallow screen # please page stroke NP  Or  PA  Or MD from 8am -4 pm  as this patient from this time will be  followed by the stroke.   You can look them up on [email or web address]  Password [email or web address]   NEUROHOSPITALIST ADDENDUM Performed a face to face diagnostic evaluation.   I have reviewed the contents of history and physical exam as documented by PA/ARNP/Resident and agree with above documentation.  I have discussed and formulated the above plan as documented. Edits to the note have been made as needed.  Impression: Patient with sudden onset right-sided weakness secondary due to left pontine infarct on MRI brain.  CTA negative for significant atherosclerotic disease. Etiology of stroke likely small vessel disease.   We will start patient on Aspirin + Plavix into 3 weeks and then aspirin alone.   Stroke team to follow pending work-up   Sushanth Aroor MD [facility] [phone] If 7pm to 7am, please call on call as listed on AMION.

## 2018-05-06 NOTE — Evaluation (Addendum)
Speech Language Pathology Evaluation Patient Details Name: Arthur Howard MRN: [id number] DOB: [birth year] Today's Date: 05/06/2018 Time: 1219-7588 SLP Time Calculation (min) (ACUTE ONLY): 16 min  Problem List:  Patient Active Problem List   Diagnosis Date Noted  . CVA (cerebral vascular accident) ([place]) 05/06/2018  . Diabetes mellitus without complication ([place])   . Hyperlipidemia 05/30/2017  . T2DM (type 2 diabetes mellitus) ([place]) 08/12/2014   Past Medical History:  Past Medical History:  Diagnosis Date  . Diabetes mellitus without complication ([place])   . Hyperlipidemia    Past Surgical History:  Past Surgical History:  Procedure Laterality Date  . ANKLE SURGERY     HPI:  Arthur Howard is a 53 y.o. spanish-speaking male with hyperlipidemia and diabetes. He was admitted to [facility] 05/06/18 with right side weakness and difficulty with balance and slurred speech (per family report). MRI revealed small acute infarction of the left inferior ventral pons; minimal chronic small-vessel change of the hemispheric white matter; small old lacunar infarction right   Assessment / Plan / Recommendation Clinical Impression   Pt was seen for cognitive-linguistic evaluation; his daughter was at bedside and acted as Veterinary surgeon throughout session. Pt presents with mild dysarthria, evidenced in reduced diadochokinetic rates and pt/family report of reduced intelligibility impaired from baseline. Pt exhibited imprecise articulatory movements throughout conversational tasks. Language and cognitive function appear to be in tact during tasks assessed; pt independently recalled 3/3 words on delayed recall task and performed [facility] on confrontation and divergent naming tasks. Pt and family also report that pt's cognitive function and language is perceived to be at baseline. Pt would benefit from skilled ST treatment to address dysarthria while in the acute setting.     SLP Assessment  SLP Recommendation/Assessment:  Patient needs continued Speech Lanaguage Pathology Services SLP Visit Diagnosis: Dysarthria and anarthria (R47.1)    Follow Up Recommendations  Other (comment)(TBD/may benefit from [facility] vs OP SLP f/u pending PT/OT evals)    Frequency and Duration min 1 x/week  2 weeks      SLP Evaluation Cognition  Overall Cognitive Status: Within Functional Limits for tasks assessed Arousal/Alertness: Awake/alert Orientation Level: Oriented X4 Attention: Sustained Sustained Attention: Appears intact Memory: Appears intact Awareness: Appears intact Problem Solving: Appears intact Safety/Judgment: Appears intact       Comprehension  Auditory Comprehension Overall Auditory Comprehension: Appears within functional limits for tasks assessed Yes/No Questions: Not tested Commands: Not tested Conversation: Simple Visual Recognition/Discrimination Discrimination: Within Function Limits Reading Comprehension Reading Status: Not tested    Expression Expression Primary Mode of Expression: Verbal Verbal Expression Overall Verbal Expression: Appears within functional limits for tasks assessed Level of Generative/Spontaneous Verbalization: Sentence Naming: No impairment Pragmatics: No impairment Non-Verbal Means of Communication: Not applicable Written Expression Written Expression: Not tested   Oral / Motor  Oral Motor/Sensory Function Overall Oral Motor/Sensory Function: Mild impairment Facial ROM: Within Functional Limits Facial Symmetry: Abnormal symmetry right Facial Strength: Within Functional Limits Facial Sensation: Within Functional Limits Lingual ROM: Within Functional Limits Lingual Symmetry: Within Functional Limits Lingual Strength: Within Functional Limits Mandible: Within Functional Limits Motor Speech Overall Motor Speech: Impaired Respiration: Within functional limits Phonation: Normal Resonance: Within functional limits Articulation: Impaired Level of Impairment:  Sentence Intelligibility: Intelligibility reduced Word: 75-100% accurate Phrase: 75-100% accurate Sentence: 75-100% accurate Conversation: 75-100% accurate Motor Planning: Witnin functional limits Motor Speech Errors: Not applicable   GO                    Arthur Howard 05/06/2018,  4:37 PM

## 2018-05-06 NOTE — ED Notes (Signed)
Pt taken to CT.

## 2018-05-07 ENCOUNTER — Inpatient Hospital Stay (HOSPITAL_COMMUNITY): Payer: Commercial Indemnity

## 2018-05-07 DIAGNOSIS — I6381 Other cerebral infarction due to occlusion or stenosis of small artery: Secondary | ICD-10-CM

## 2018-05-07 DIAGNOSIS — E1165 Type 2 diabetes mellitus with hyperglycemia: Secondary | ICD-10-CM

## 2018-05-07 DIAGNOSIS — E113393 Type 2 diabetes mellitus with moderate nonproliferative diabetic retinopathy without macular edema, bilateral: Secondary | ICD-10-CM

## 2018-05-07 DIAGNOSIS — E785 Hyperlipidemia, unspecified: Secondary | ICD-10-CM

## 2018-05-07 DIAGNOSIS — E119 Type 2 diabetes mellitus without complications: Secondary | ICD-10-CM

## 2018-05-07 DIAGNOSIS — I639 Cerebral infarction, unspecified: Secondary | ICD-10-CM

## 2018-05-07 DIAGNOSIS — Z720 Tobacco use: Secondary | ICD-10-CM

## 2018-05-07 LAB — GLUCOSE, CAPILLARY
Glucose-Capillary: 182 mg/dL — ABNORMAL HIGH (ref 70–99)
Glucose-Capillary: 197 mg/dL — ABNORMAL HIGH (ref 70–99)
Glucose-Capillary: 172 mg/dL — ABNORMAL HIGH (ref 70–99)
Glucose-Capillary: 210 mg/dL — ABNORMAL HIGH (ref 70–99)
Glucose-Capillary: 283 mg/dL — ABNORMAL HIGH (ref 70–99)

## 2018-05-07 LAB — HIV ANTIBODY (ROUTINE TESTING W REFLEX): HIV Screen 4th Generation wRfx: NONREACTIVE

## 2018-05-07 LAB — LIPID PANEL
CHOL/HDL RATIO: 6.2 ratio
Cholesterol: 211 mg/dL — ABNORMAL HIGH (ref 0–200)
HDL: 34 mg/dL — ABNORMAL LOW (ref >40)
LDL Cholesterol: 133 mg/dL — ABNORMAL HIGH (ref 0–99)
TRIGLYCERIDES: 220 mg/dL — AB (ref <150)
VLDL: 44 mg/dL — ABNORMAL HIGH (ref 0–40)

## 2018-05-07 LAB — ECHOCARDIOGRAM COMPLETE

## 2018-05-07 LAB — RAPID URINE DRUG SCREEN, HOSP PERFORMED
Amphetamines: NOT DETECTED
Barbiturates: NOT DETECTED
Benzodiazepines: NOT DETECTED
Cocaine: NOT DETECTED
Opiates: NOT DETECTED
Tetrahydrocannabinol: NOT DETECTED

## 2018-05-07 LAB — HEMOGLOBIN A1C
Hgb A1c MFr Bld: 11.3 % — ABNORMAL HIGH (ref 4.8–5.6)
Mean Plasma Glucose: 278 mg/dL

## 2018-05-07 MED ORDER — LIVING WELL WITH DIABETES BOOK - IN SPANISH
Freq: Once | Status: AC
  Administered 2018-05-07: 17:00:00
  Filled 2018-05-07: qty 1

## 2018-05-07 NOTE — Progress Notes (Signed)
Inpatient Diabetes Program Recommendations  AACE/ADA: New Consensus Statement on Inpatient Glycemic Control (2015)  Target Ranges:  Prepandial:   less than 140 mg/dL      Peak postprandial:   less than 180 mg/dL (1-2 hours)      Critically ill patients:  140 - 180 mg/dL   Lab Results  Component Value Date   GLUCAP 197 (H) 05/07/2018   HGBA1C 11.3 (H) 05/06/2018    Review of Glycemic Control Results for Arthur Howard, Arthur Howard (MRN [id number]) as of 05/07/2018 13:48  Ref. Range 05/06/2018 13:17 05/06/2018 17:18 05/06/2018 21:07 05/07/2018 06:29 05/07/2018 11:28  Glucose-Capillary Latest Ref Range: 70 - 99 mg/dL 215 (H) 241 (H) 145 (H) 182 (H) 197 (H)   Diabetes history: DM 2 Outpatient Diabetes medications:  Glipizide 5 mg bid, Metformin 1000 mg bid- patient has not been taking for the past 8 months Current orders for Inpatient glycemic control:  Novolog moderate tid with meals and HS Inpatient Diabetes Program Recommendations:    Visited with patient and entire family.  Patient states that he was okay with me talking with him and his family.  Daughter translated for me.  According to patient, he stopped taking his diabetes medications about 8 months ago b/c it made him "dizzy".  We discussed signs and symptoms of both high and low blood sugars.  ? Whether patient was having symptoms of low blood sugars.  We reviewed hypoglycemia treatment and the importance of checking blood sugars when he feels dizzy. He needs oral diabetes medications restarted at discharge.  We discussed why high blood sugars are bad for his body and increased risk for stroke and vascular issues. Patient and family seemed surprised by this.  We discussed him buying "Reli-on glucose meter"  At [facility] and checking blood sugars 2 times a day.  Daughters told patient that they would help him and remind him of taking his medications.  They also state that they will drive him to his doctor's appointments.  Will order LWWD booklet for  patient and family.  We also discussed A1C results.  They were very pleased that Dr. had written A1C on whiteboard for them and spoken to patient in [place]. Note potential plans for CIR after hospitalization.   -Consider adding Lantus 10 units daily while in hospital.   May be able to resume oral medications for DM at d/c since he had not been taking them when A1C was checked. Needs close f/u with PCP as well.  Thanks,  Adah Perl, RN, BC-ADM Inpatient Diabetes Coordinator Pager [phone] (8a-5p)

## 2018-05-07 NOTE — Care Management Note (Signed)
Case Management Note  Patient Details  Name: Arthur Howard MRN: [id number] Date of Birth: January 27, 1966  Subjective/Objective:   Pt admitted with a stroke. He is from home with family.  DME: none Pt not taking meds at home. No issues with transportation.  No PCP: CM was able to get him a PCP appt but not until March 19th. If he d/c to CIR this will work for the patient. If not then may need to be changed.                 Action/Plan: Recommendations are for CIR. CM following for d/c disposition.  Expected Discharge Date:                  Expected Discharge Plan:  [place]  In-House Referral:     Discharge planning Services  CM Consult(PCP)  Post Acute Care Choice:    Choice offered to:     DME Arranged:    DME Agency:     HH Arranged:    HH Agency:     Status of Service:  In process, will continue to follow  If discussed at Long Length of Stay Meetings, dates discussed:    Additional Comments:  Pollie Friar, RN 05/07/2018, 3:54 PM

## 2018-05-07 NOTE — Progress Notes (Addendum)
STROKE TEAM PROGRESS NOTE   INTERVAL HISTORY Multiple family members are at the bedside.history of present illness was reviewed with the patient and family. He presented with sudden onset of right facial droop and right arm and leg weakness. He has made slight improvement but still has deficits.  Vitals:   05/06/18 1949 05/06/18 2208 05/07/18 0015 05/07/18 0424  BP: 126/65  110/60 119/72  Pulse: (!) 57  (!) 45 (!) 46  Resp: 18 (!) 21 18 18   Temp: 97.7 F (36.5 C)  (!) 97.4 F (36.3 C) 97.6 F (36.4 C)  TempSrc: Oral  Oral Oral  SpO2: 99%  97% 99%    PHYSICAL EXAM Healthy looking middle-aged  Hispanic male. Not in distress. . Afebrile. Head is nontraumatic. Neck is supple without bruit.    Cardiac exam no murmur or gallop. Lungs are clear to auscultation. Distal pulses are well felt.  Neurological Exam :  Awake alert oriented 3 with normal speech and language function. Extraocular moments are full range without nystagmus. Blinks to threat bilaterally. Vision acuity seems adequate. Fundi were not visualized. Mild right lower facial weakness. Tongue midline. Right hemi-paresis with right upper extremity 3/5 strength proximally and significant weakness of right grip and wrist muscles. Right lower extremity strength is 4+/5 with weakness of right hip flexors and ankle dorsiflexors. Sensation appears preserved. Coordination is accurate. Gait not tested. ASSESSMENT/PLAN [name] is a 53 y.o. male with history of hyperlipidemia and diabetes presenting with right arm and leg weakness, imbalance with gait, right facial droop.   Stroke:   Small left pontine infarct secondary to small vessel disease    CT head small R PLIC infarct age indeterminate.  Foci AV calcification.  Foci paranasal sinus disease.  CTA head & neck normal  MRI small L inferior ventral pontine infarct.  Small vessel disease.  Old R BG lacune.  2D Echo  EF 60-65%. No source of embolus   LDL 133  HgbA1c  11.3  Lovenox 40 mg sq daily for VTE prophylaxis  No antithrombotic prior to admission, now on aspirin 81 mg daily and clopidogrel 75 mg daily x3 weeks then aspirin alone.   Therapy recommendations: CIR  Disposition:  pending  NOTHING FURTHER TO ADD FROM THE STROKE STANDPOINT  Patient has a 10-15% risk of having another stroke over the next year, the highest risk is within 2 weeks of the most recent stroke/TIA (risk of having a stroke following a stroke or TIA is the same).  Ongoing risk factor control by Primary Care Physician  Stroke Service will sign off. Please call should any needs arise.  Follow-up Stroke Clinic at [facility] in 4 weeks, order placed.  Hypertension  Stable . Permissive hypertension (OK if < 220/120) but gradually normalize in 5-7 days . Long-term BP goal normotensive  Hyperlipidemia  Home meds: Lipitor 40  Increased to Lipitor 80 in hospital  LDL 133, goal < 70  Continue statin at discharge  Diabetes type II  HgbA1c 11.3, goal < 7.0  Uncontrolled  Diabetes nurse consulted and recommended Lantus 10 units daily in hospital  Other Stroke Risk Factors  Cigarette smoker, advised to stop smoking  Hospital day # [place], MSN, APRN, ANVP-BC, AGPCNP-BC Advanced Practice Stroke Nurse [place] for Schedule & Pager information 05/07/2018 3:08 PM  I have personally obtained history,examined this patient, reviewed notes, independently viewed imaging studies, participated in medical decision making and plan of care.ROS completed by  me personally and pertinent positives fully documented  I have made any additions or clarifications directly to the above note. Agree with note above. He presented with right hemiplegia due to  pontine lacunar infarct from small vessel disease. Recommend aspirin and Plavix for 3 weeks followed by aspirin alone. Aggressive risk factor modification. Physical occupational  therapy and rehabilitation consults. Follow-up as an outpatient in stroke clinic in 6 weeks. Stroke team will sign off. Kindly call for questions.greater than 50% time during this 25 minute visit was spent on counseling and coordination of care about his hemiplegia and lacunar infarct and answered questions. Discussed with multiple family members in the room at the bedside  Antony Contras, [place] Pager: [phone] 05/07/2018 4:15 PM  To contact Stroke Continuity provider, please refer to [email or web address] After hours, contact General Neurology

## 2018-05-07 NOTE — Progress Notes (Signed)
Nutrition Brief Note  RD consulted for assessment of nutrition requirements/status.  Wt Readings from Last 15 Encounters:  05/30/17 70.2 kg  09/20/16 67.5 kg  09/18/16 70.1 kg  03/04/16 69.9 kg  11/10/15 70.6 kg  08/12/14 69.8 kg  02/22/14 73.9 kg    Patient reports height of 5 feet 8 inches (1.73 meters) with recent weight of 174 lbs (79 kg). Weight reported to be stable. Patient meets criteria for overweight based on current BMI.   Current diet order is heart healthy/carbohydrate modified. Family at bedside reports pt with good appetite currently and PTA with usual consumption of at least 3 meals a day with no other difficulties. Handout "Counting Carbohydrates for People wit Diabetes" in Spanish language from the Academy of Nutrition and Dietetics Manual was given. Labs and medications reviewed.   No nutrition interventions warranted at this time. If nutrition issues arise, please consult RD.   Corrin Parker, MS, RD, LDN Pager # [phone] After hours/ weekend pager # [phone]

## 2018-05-07 NOTE — Consult Note (Signed)
Physical Medicine and Rehabilitation Consult Reason for Consult: Stroke Referring Physician: Charlynne Cousins, MD   HPI: Arthur Howard is a 53 y.o.right handed non-English-speaking male with history of tobacco abuse,diabetes mellitus hyperlipidemia. Per chart review and daughter, patient lives with wife and daughter. 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 Cranial CT scan showed small age uncertain infarct involving a portion of the genu and posterior limb of the right internal capsule. Urine drug screen pending. CT angiogram of head and neck negative. Patient did not receive TPA.  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 No signs of hemorrhage. Echocardiogram completed, read pending. Presently maintained on aspirin and Plavix for CVA prophylaxis. Subcutaneous Lovenox for DVT prophylaxis. Tolerating a regular consistency diet.  Therapy is recommending CIR.  Review of Systems  Unable to perform ROS: Language   Past Medical History:  Diagnosis Date  . Diabetes mellitus without complication ([place])   . Hyperlipidemia    Past Surgical History:  Procedure Laterality Date  . ANKLE SURGERY     Family History  Problem Relation Age of Onset  . Diabetes Sister   . Diabetes Brother   . CVA Neg Hx    Social History:  reports that he has been smoking. He has never used smokeless tobacco. He reports that he does not drink alcohol or use drugs. Allergies: No Known Allergies Medications Prior to Admission  Medication Sig Dispense Refill  . acetaminophen (TYLENOL) 500 MG tablet Take 500 mg by mouth every 6 (six) hours as needed for mild pain.    Marland Kitchen atorvastatin (LIPITOR) 40 MG tablet Take 1 tablet (40 mg total) by mouth daily. (Patient not taking: Reported on 05/06/2018) 90 tablet 1  . blood glucose meter kit and supplies KIT Dispense based on patient and  insurance preference. Use up to four times daily as directed. (FOR ICD-9 250.00, 250.01). (Patient not taking: Reported on 05/30/2017) 1 each 0  . glipiZIDE (GLUCOTROL) 5 MG tablet Take 1 tablet (5 mg total) by mouth 2 (two) times daily before a meal. (Patient not taking: Reported on 05/06/2018) 90 tablet 1  . metFORMIN (GLUCOPHAGE) 1000 MG tablet Take 1 tablet (1,000 mg total) by mouth 2 (two) times daily with a meal. 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 rate 16, SpO2 98 %. Physical Exam  Vitals reviewed. Constitutional: He appears well-developed and well-nourished.  HENT:  Head: Normocephalic and atraumatic.  Eyes: EOM are normal. Right eye exhibits no discharge. Left eye exhibits  no discharge.  Neck: Normal range of motion. Neck supple.  Cardiovascular: Normal rate and regular  rhythm.  Respiratory: Effort normal and breath sounds normal.  GI: Soft. Bowel sounds are normal.  Musculoskeletal:     Comments: No edema or tenderness in extremities  Neurological: He is alert.  Multiple family members at bedside  Follow simple demonstrated commands.  Motor: LUE/LLE: 5/5 proximal distal RUE: 2+/5 proximal to distal with apraxia RLE: 4-/5 proximal to distal  Skin: Skin is warm and dry.  Psychiatric:  Mood, affect, behavior appear to be normal    Results for orders placed or performed during the hospital encounter of 05/06/18 (from the past 24 hour(s))  HIV antibody (Routine Testing)     Status: None   Collection Time: 05/06/18  1:08 PM  Result Value Ref Range   HIV Screen 4th Generation wRfx Non Reactive Non Reactive  CBG monitoring, ED     Status: Abnormal   Collection Time: 05/06/18  1:17 PM  Result Value Ref Range   Glucose-Capillary 215 (H) 70 - 99 mg/dL  Glucose, capillary     Status: Abnormal   Collection Time: 05/06/18  5:18 PM  Result Value Ref Range   Glucose-Capillary 241 (H) 70 - 99 mg/dL   Comment 1 Notify RN    Comment 2 Document in Chart   TSH     Status: None   Collection Time: 05/06/18  7:45 PM  Result Value Ref Range   TSH 1.891 0.350 - 4.500 uIU/mL  Glucose, capillary     Status: Abnormal   Collection Time: 05/06/18  9:07 PM  Result Value Ref Range   Glucose-Capillary 145 (H) 70 - 99 mg/dL   Comment 1 Notify RN    Comment 2 Document in Chart   Lipid panel     Status: Abnormal   Collection Time: 05/07/18  6:16 AM  Result Value Ref Range   Cholesterol 211 (H) 0 - 200 mg/dL   Triglycerides 220 (H) <150 mg/dL   HDL 34 (L) >40 mg/dL   Total CHOL/HDL Ratio 6.2 RATIO   VLDL 44 (H) 0 - 40 mg/dL   LDL Cholesterol 133 (H) 0 - 99 mg/dL  Glucose, capillary     Status: Abnormal   Collection Time: 05/07/18  6:29 AM  Result Value Ref Range   Glucose-Capillary 182 (H) 70 - 99 mg/dL   Comment 1 Notify RN    Comment 2 Document in Chart    Ct  Angio Head W Or Wo Contrast  Result Date: 05/06/2018 CLINICAL DATA:  Right-sided weakness and speech disturbance beginning 1830 hours yesterday. EXAM: CT ANGIOGRAPHY HEAD AND NECK TECHNIQUE: Multidetector CT imaging of the head and neck was performed using the standard protocol during bolus administration of intravenous contrast. Multiplanar CT image reconstructions and MIPs were obtained to evaluate the vascular anatomy. Carotid stenosis measurements (when applicable) are obtained utilizing NASCET criteria, using the distal internal carotid diameter as the denominator. CONTRAST:  138m ISOVUE-370 IOPAMIDOL (ISOVUE-370) INJECTION 76% COMPARISON:  Head CT same day FINDINGS: CTA NECK FINDINGS Aortic arch: Very minimal atherosclerotic calcification. No meaningful atherosclerosis. No aneurysm or dissection. Branching pattern is normal. Right carotid system: Common carotid artery widely patent to the bifurcation. The carotid bifurcation is normal without atherosclerotic disease. Cervical ICA is widely patent to the skull base. Left carotid system: Common carotid artery widely patent to the bifurcation. The carotid bifurcation is normal without atherosclerotic disease. Cervical ICA is normal. Vertebral arteries: Both vertebral artery origins are  widely patent. Both vertebral arteries are widely patent through the cervical region to the foramen magnum. Skeleton: Minimal mid cervical spondylosis. Other neck: No mass or lymphadenopathy. Upper chest: Normal Review of the MIP images confirms the above findings CTA HEAD FINDINGS Anterior circulation: Both internal carotid arteries are widely patent through the skull base and siphon regions. The anterior and middle cerebral vessels are patent without proximal stenosis, aneurysm or vascular malformation. Posterior circulation: Both vertebral arteries are widely patent through the foramen magnum to the basilar. No basilar stenosis. Posterior circulation branch vessels are  normal. Venous sinuses: Patent and normal. Anatomic variants: None Delayed phase: No abnormal enhancement. Review of the MIP images confirms the above findings IMPRESSION: Normal examination.  No brachiocephalic vascular disease. Electronically Signed   By: Nelson Chimes M.D.   On: 05/06/2018 11:28   Ct Head Wo Contrast  Result Date: 05/06/2018 CLINICAL DATA:  Right-sided weakness and dysarthria EXAM: CT HEAD WITHOUT CONTRAST TECHNIQUE: Contiguous axial images were obtained from the base of the skull through the vertex without intravenous contrast. COMPARISON:  None. FINDINGS: Brain: Ventricles are normal in size and configuration. There is no intracranial mass, hemorrhage, extra-axial fluid collection, or midline shift. There is focal decreased attenuation along a portion of the genu and posterior limb of the right internal capsule consistent with age uncertain and possible recent small infarct in this area. Elsewhere brain parenchyma appears unremarkable. Vascular: No hyperdense vessel. There is calcification in each carotid siphon region. Skull: Bony calvarium appears intact. Sinuses/Orbits: There is mucosal thickening involving multiple ethmoid air cells bilaterally. There is a small retention cyst in the superomedial right maxillary antrum. Orbits appear symmetric bilaterally. Other: Mastoid air cells are clear. IMPRESSION: Small age uncertain infarct involving a portion of the genu and posterior limb of the right internal capsule. Brain parenchyma elsewhere appears unremarkable. No evident mass or hemorrhage. There are foci of arterial vascular calcification. There are foci of paranasal sinus disease. Electronically Signed   By: Lowella Grip III M.D.   On: 05/06/2018 08:33   Ct Angio Neck W Or Wo Contrast  Result Date: 05/06/2018 CLINICAL DATA:  Right-sided weakness and speech disturbance beginning 1830 hours yesterday. EXAM: CT ANGIOGRAPHY HEAD AND NECK TECHNIQUE: Multidetector CT imaging of the  head and neck was performed using the standard protocol during bolus administration of intravenous contrast. Multiplanar CT image reconstructions and MIPs were obtained to evaluate the vascular anatomy. Carotid stenosis measurements (when applicable) are obtained utilizing NASCET criteria, using the distal internal carotid diameter as the denominator. CONTRAST:  157m ISOVUE-370 IOPAMIDOL (ISOVUE-370) INJECTION 76% COMPARISON:  Head CT same day FINDINGS: CTA NECK FINDINGS Aortic arch: Very minimal atherosclerotic calcification. No meaningful atherosclerosis. No aneurysm or dissection. Branching pattern is normal. Right carotid system: Common carotid artery widely patent to the bifurcation. The carotid bifurcation is normal without atherosclerotic disease. Cervical ICA is widely patent to the skull base. Left carotid system: Common carotid artery widely patent to the bifurcation. The carotid bifurcation is normal without atherosclerotic disease. Cervical ICA is normal. Vertebral arteries: Both vertebral artery origins are widely patent. Both vertebral arteries are widely patent through the cervical region to the foramen magnum. Skeleton: Minimal mid cervical spondylosis. Other neck: No mass or lymphadenopathy. Upper chest: Normal Review of the MIP images confirms the above findings CTA HEAD FINDINGS Anterior circulation: Both internal carotid arteries are widely patent through the skull base and siphon regions. The anterior and middle cerebral vessels are patent without proximal stenosis, aneurysm or vascular  malformation. Posterior circulation: Both vertebral arteries are widely patent through the foramen magnum to the basilar. No basilar stenosis. Posterior circulation branch vessels are normal. Venous sinuses: Patent and normal. Anatomic variants: None Delayed phase: No abnormal enhancement. Review of the MIP images confirms the above findings IMPRESSION: Normal examination.  No brachiocephalic vascular disease.  Electronically Signed   By: Nelson Chimes M.D.   On: 05/06/2018 11:28   Mr Brain Wo Contrast  Result Date: 05/06/2018 CLINICAL DATA:  Right arm and leg weakness and balance disturbance. Negative CT evaluation. EXAM: MRI HEAD WITHOUT CONTRAST TECHNIQUE: Multiplanar, multiecho pulse sequences of the brain and surrounding structures were obtained without intravenous contrast. COMPARISON:  CT study same day FINDINGS: Brain: Diffusion imaging shows acute infarction in the left ventral inferior pons. No other acute finding. Elsewhere, the brainstem and cerebellum are normal. There is an old small vessel infarction in the right basal ganglia/radiating white matter tracts. There is minimal small vessel ischemic change of the hemispheric white matter. No large vessel territory infarction. No mass lesion, hemorrhage, hydrocephalus or extra-axial collection. Vascular: Major vessels at the base of the brain show flow. Skull and upper cervical spine: Negative Sinuses/Orbits: Clear/normal Other: None IMPRESSION: Small acute infarction of the left inferior ventral pons. No sign of hemorrhage or mass effect. Minimal chronic small-vessel change of the hemispheric white matter elsewhere as described above. Small old lacunar infarction right basal ganglia/radiating white matter tracts. Electronically Signed   By: Nelson Chimes M.D.   On: 05/06/2018 12:51    Assessment/Plan: Diagnosis: acute infarct left inferior ventral pons Labs and images (see above) independently reviewed.  Records reviewed and summated above. Stroke: Continue secondary stroke prophylaxis and Risk Factor Modification listed below:   Antiplatelet therapy:   Blood Pressure Management:  Continue current medication with prn's with permisive HTN per primary team Statin Agent:   Diabetes management:   Tobacco abuse:   Right sided hemiparesis: fit for orthosis to prevent contractures (resting hand splint for day, wrist cock up splint at night) Motor  recovery: Fluoxetine  1. Does the need for close, 24 hr/day medical supervision in concert with the patient's rehab needs make it unreasonable for this patient to be served in a less intensive setting? Yes  2. Co-Morbidities requiring supervision/potential complications:  tobacco abuse (counsel), uncontrolled diabetes mellitus (Monitor in accordance with exercise and adjust meds as necessary), hyperlipidemia 3. Due to safety, disease management and patient education, does the patient require 24 hr/day rehab nursing? Yes 4. Does the patient require coordinated care of a physician, rehab nurse, PT (1-2 hrs/day, 5 days/week) and OT (1-2 hrs/day, 5 days/week) to address physical and functional deficits in the context of the above medical diagnosis(es)? Yes Addressing deficits in the following areas: balance, endurance, locomotion, strength, transferring, bathing, dressing, toileting and psychosocial support 5. Can the patient actively participate in an intensive therapy program of at least 3 hrs of therapy per day at least 5 days per week? Yes 6. The potential for patient to make measurable gains while on inpatient rehab is excellent 7. Anticipated functional outcomes upon discharge from inpatient rehab are modified independent and supervision  with PT, modified independent and supervision with OT, n/a with SLP. 8. Estimated rehab length of stay to reach the above functional goals is: 7-10 days. 9. Anticipated D/C setting: Home 10. Anticipated post D/C treatments: HH therapy and Home excercise program 11. Overall Rehab/Functional Prognosis: good  RECOMMENDATIONS: This patient's condition is appropriate for continued rehabilitative care in the following  setting: CIR Patient has agreed to participate in recommended program. Yes Note that insurance prior authorization may be required for reimbursement for recommended care.  Comment: Rehab Admissions Coordinator to follow up.   I have personally  performed a face to face diagnostic evaluation, including, but not limited to relevant history and physical exam findings, of this patient and developed relevant assessment and plan.  Additionally, I have reviewed and concur with the physician assistant's documentation above.   Delice Lesch, MD, ABPMR Lavon Paganini Angiulli, PA-C 05/07/2018

## 2018-05-07 NOTE — Plan of Care (Signed)
  Problem: Education: Goal: Knowledge of disease or condition will improve Outcome: Progressing Goal: Knowledge of secondary prevention will improve Outcome: Progressing Goal: Knowledge of patient specific risk factors addressed and post discharge goals established will improve Outcome: Progressing Goal: Individualized Educational Video(s) Outcome: Progressing   Problem: Coping: Goal: Will verbalize positive feelings about self Outcome: Progressing   Problem: Health Behavior/Discharge Planning: Goal: Ability to manage health-related needs will improve Outcome: Progressing   Problem: Self-Care: Goal: Ability to participate in self-care as condition permits will improve Outcome: Progressing Goal: Verbalization of feelings and concerns over difficulty with self-care will improve Outcome: Progressing Goal: Ability to communicate needs accurately will improve Outcome: Progressing   Problem: Nutrition: Goal: Risk of aspiration will decrease Outcome: Progressing   Problem: Ischemic Stroke/TIA Tissue Perfusion: Goal: Complications of ischemic stroke/TIA will be minimized Outcome: Progressing   Problem: Education: Goal: Knowledge of General Education information will improve Description Including pain rating scale, medication(s)/side effects and non-pharmacologic comfort measures Outcome: Progressing   Problem: Health Behavior/Discharge Planning: Goal: Ability to manage health-related needs will improve Outcome: Progressing   Problem: Clinical Measurements: Goal: Ability to maintain clinical measurements within normal limits will improve Outcome: Progressing Goal: Will remain free from infection Outcome: Progressing Goal: Diagnostic test results will improve Outcome: Progressing Goal: Respiratory complications will improve Outcome: Progressing Goal: Cardiovascular complication will be avoided Outcome: Progressing   Problem: Activity: Goal: Risk for activity intolerance  will decrease Outcome: Progressing   Problem: Nutrition: Goal: Adequate nutrition will be maintained Outcome: Progressing   Problem: Coping: Goal: Level of anxiety will decrease Outcome: Progressing   Problem: Elimination: Goal: Will not experience complications related to bowel motility Outcome: Progressing Goal: Will not experience complications related to urinary retention Outcome: Progressing   Problem: Pain Managment: Goal: General experience of comfort will improve Outcome: Progressing   Problem: Safety: Goal: Ability to remain free from injury will improve Outcome: Progressing   Problem: Skin Integrity: Goal: Risk for impaired skin integrity will decrease Outcome: Progressing

## 2018-05-07 NOTE — Progress Notes (Signed)
  Echocardiogram 2D Echocardiogram has been performed.  Matilde Bash 05/07/2018, 1:13 PM

## 2018-05-07 NOTE — Progress Notes (Signed)
[facility] PROGRESS NOTE    Progress Note  Arthur Howard  UJW:[id number] DOB: [birth year] DOA: 05/06/2018 PCP: Patient, No Pcp Per     Brief Narrative:   Arthur Howard is an 53 y.o. male past medical history significant for diabetes, hyperlipidemia and weakness noticed right lower extremity and right upper extremity numbness and weakness.  He was getting up for work and he felt very weak and unable to brush his teeth.  He went to work and noticed his right arm and leg were still weak.  Assessment/Plan:   CVA (cerebral vascular accident) ([place]) HgbA1c 11.3, fasting lipid panel started on statin 80 mg. 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 PT, OT, pending Echocardiogram pending Carotid dopplers pending Prophylactic therapy-Antiplatelet med: Dual platelet anti-therapy with aspirin 81 mg and Plavix. Permissive hypertension  Elevated blood pressure without a diagnosis of hypertension: Allow for permissive hypertension. Pressure has improved very nicely in the hospital.  Hyperlipidemia: High dose statin therapy with Lipitor 80.  Diabetes mellitus type 2: A1c of 11.3. He has not been taking his medications at home.    DVT prophylaxis: lovenox Family Communication:daughter nad wife Disposition Plan/Barrier to D/C: home in 2 days Code Status:     Code Status Orders  (From admission, onward)         Start     Ordered   05/06/18 1047  Full code  Continuous     05/06/18 1048        Code Status History    This patient has a current code status but no historical code status.        IV Access:    Peripheral IV   Procedures and diagnostic studies:   Ct Angio Head W Or Wo Contrast  Result Date: 05/06/2018 CLINICAL DATA:  Right-sided weakness and speech disturbance beginning 1830 hours yesterday. EXAM: CT ANGIOGRAPHY HEAD AND NECK TECHNIQUE: Multidetector CT imaging of the head and neck was performed using the  standard protocol during bolus administration of intravenous contrast. Multiplanar CT image reconstructions and MIPs were obtained to evaluate the vascular anatomy. Carotid stenosis measurements (when applicable) are obtained utilizing NASCET criteria, using the distal internal carotid diameter as the denominator. CONTRAST:  158mL ISOVUE-370 IOPAMIDOL (ISOVUE-370) INJECTION 76% COMPARISON:  Head CT same day FINDINGS: CTA NECK FINDINGS Aortic arch: Very minimal atherosclerotic calcification. No meaningful atherosclerosis. No aneurysm or dissection. Branching pattern is normal. Right carotid system: Common carotid artery widely patent to the bifurcation. The carotid bifurcation is normal without atherosclerotic disease. Cervical ICA is widely patent to the skull base. Left carotid system: Common carotid artery widely patent to the bifurcation. The carotid bifurcation is normal without atherosclerotic disease. Cervical ICA is normal. Vertebral arteries: Both vertebral artery origins are widely patent. Both vertebral arteries are widely patent through the cervical region to the foramen magnum. Skeleton: Minimal mid cervical spondylosis. Other neck: No mass or lymphadenopathy. Upper chest: Normal Review of the MIP images confirms the above findings CTA HEAD FINDINGS Anterior circulation: Both internal carotid arteries are widely patent through the skull base and siphon regions. The anterior and middle cerebral vessels are patent without proximal stenosis, aneurysm or vascular malformation. Posterior circulation: Both vertebral arteries are widely patent through the foramen magnum to the basilar. No basilar stenosis. Posterior circulation branch vessels are normal. Venous sinuses: Patent and normal. Anatomic variants: None Delayed phase: No abnormal enhancement. Review of the MIP images confirms the above findings IMPRESSION: Normal examination.  No  brachiocephalic vascular disease. Electronically Signed   By: Arthur Howard M.D.   On: 05/06/2018 11:28   Ct Head Wo Contrast  Result Date: 05/06/2018 CLINICAL DATA:  Right-sided weakness and dysarthria EXAM: CT HEAD WITHOUT CONTRAST TECHNIQUE: Contiguous axial images were obtained from the base of the skull through the vertex without intravenous contrast. COMPARISON:  None. FINDINGS: Brain: Ventricles are normal in size and configuration. There is no intracranial mass, hemorrhage, extra-axial fluid collection, or midline shift. There is focal decreased attenuation along a portion of the genu and posterior limb of the right internal capsule consistent with age uncertain and possible recent small infarct in this area. Elsewhere brain parenchyma appears unremarkable. Vascular: No hyperdense vessel. There is calcification in each carotid siphon region. Skull: Bony calvarium appears intact. Sinuses/Orbits: There is mucosal thickening involving multiple ethmoid air cells bilaterally. There is a small retention cyst in the superomedial right maxillary antrum. Orbits appear symmetric bilaterally. Other: Mastoid air cells are clear. IMPRESSION: Small age uncertain infarct involving a portion of the genu and posterior limb of the right internal capsule. Brain parenchyma elsewhere appears unremarkable. No evident mass or hemorrhage. There are foci of arterial vascular calcification. There are foci of paranasal sinus disease. Electronically Signed   By: Arthur Howard M.D.   On: 05/06/2018 08:33   Ct Angio Neck W Or Wo Contrast  Result Date: 05/06/2018 CLINICAL DATA:  Right-sided weakness and speech disturbance beginning 1830 hours yesterday. EXAM: CT ANGIOGRAPHY HEAD AND NECK TECHNIQUE: Multidetector CT imaging of the head and neck was performed using the standard protocol during bolus administration of intravenous contrast. Multiplanar CT image reconstructions and MIPs were obtained to evaluate the vascular anatomy. Carotid stenosis measurements (when applicable) are obtained  utilizing NASCET criteria, using the distal internal carotid diameter as the denominator. CONTRAST:  195mL ISOVUE-370 IOPAMIDOL (ISOVUE-370) INJECTION 76% COMPARISON:  Head CT same day FINDINGS: CTA NECK FINDINGS Aortic arch: Very minimal atherosclerotic calcification. No meaningful atherosclerosis. No aneurysm or dissection. Branching pattern is normal. Right carotid system: Common carotid artery widely patent to the bifurcation. The carotid bifurcation is normal without atherosclerotic disease. Cervical ICA is widely patent to the skull base. Left carotid system: Common carotid artery widely patent to the bifurcation. The carotid bifurcation is normal without atherosclerotic disease. Cervical ICA is normal. Vertebral arteries: Both vertebral artery origins are widely patent. Both vertebral arteries are widely patent through the cervical region to the foramen magnum. Skeleton: Minimal mid cervical spondylosis. Other neck: No mass or lymphadenopathy. Upper chest: Normal Review of the MIP images confirms the above findings CTA HEAD FINDINGS Anterior circulation: Both internal carotid arteries are widely patent through the skull base and siphon regions. The anterior and middle cerebral vessels are patent without proximal stenosis, aneurysm or vascular malformation. Posterior circulation: Both vertebral arteries are widely patent through the foramen magnum to the basilar. No basilar stenosis. Posterior circulation branch vessels are normal. Venous sinuses: Patent and normal. Anatomic variants: None Delayed phase: No abnormal enhancement. Review of the MIP images confirms the above findings IMPRESSION: Normal examination.  No brachiocephalic vascular disease. Electronically Signed   By: Arthur Howard M.D.   On: 05/06/2018 11:28   Mr Brain Wo Contrast  Result Date: 05/06/2018 CLINICAL DATA:  Right arm and leg weakness and balance disturbance. Negative CT evaluation. EXAM: MRI HEAD WITHOUT CONTRAST TECHNIQUE:  Multiplanar, multiecho pulse sequences of the brain and surrounding structures were obtained without intravenous contrast. COMPARISON:  CT study same day FINDINGS: Brain: Diffusion  imaging shows acute infarction in the left ventral inferior pons. No other acute finding. Elsewhere, the brainstem and cerebellum are normal. There is an old small vessel infarction in the right basal ganglia/radiating white matter tracts. There is minimal small vessel ischemic change of the hemispheric white matter. No large vessel territory infarction. No mass lesion, hemorrhage, hydrocephalus or extra-axial collection. Vascular: Major vessels at the base of the brain show flow. Skull and upper cervical spine: Negative Sinuses/Orbits: Clear/normal Other: None IMPRESSION: Small acute infarction of the left inferior ventral pons. No sign of hemorrhage or mass effect. Minimal chronic small-vessel change of the hemispheric white matter elsewhere as described above. Small old lacunar infarction right basal ganglia/radiating white matter tracts. Electronically Signed   By: Arthur Howard M.D.   On: 05/06/2018 12:51     Medical Consultants:    None.  Anti-Infectives:     Subjective:    Arthur Howard no complains  Objective:    Vitals:   05/06/18 1949 05/06/18 2208 05/07/18 0015 05/07/18 0424  BP: 126/65  110/60 119/72  Pulse: (!) 57  (!) 45 (!) 46  Resp: 18 (!) 21 18 18   Temp: 97.7 F (36.5 C)  (!) 97.4 F (36.3 C) 97.6 F (36.4 C)  TempSrc: Oral  Oral Oral  SpO2: 99%  97% 99%    Intake/Output Summary (Last 24 hours) at 05/07/2018 0805 Last data filed at 05/07/2018 6222 Gross per 24 hour  Intake 936.66 ml  Output -  Net 936.66 ml   There were no vitals filed for this visit.  Exam: General exam: In no acute distress. Respiratory system: Good air movement and clear to auscultation. Cardiovascular system: S1 & S2 heard, RRR.  Gastrointestinal system: Abdomen is nondistended, soft and nontender.    Central nervous system: Alert and oriented. No focal neurological deficits. Extremities: No pedal edema. Skin: No rashes, lesions or ulcers Psychiatry: Judgement and insight appear normal. Mood & affect appropriate.    Data Reviewed:    Labs: Basic Metabolic Panel: Recent Labs  Lab 05/06/18 0741  NA 135  K 4.2  CL 99  CO2 25  GLUCOSE 297*  BUN 15  CREATININE 0.80  CALCIUM 9.3   GFR CrCl cannot be calculated (Unknown ideal weight.). Liver Function Tests: Recent Labs  Lab 05/06/18 0741  AST 20  ALT 23  ALKPHOS 57  BILITOT 0.9  PROT 6.5  ALBUMIN 3.6   No results for input(s): LIPASE, AMYLASE in the last 168 hours. No results for input(s): AMMONIA in the last 168 hours. Coagulation profile Recent Labs  Lab 05/06/18 0741  INR 0.99    CBC: Recent Labs  Lab 05/06/18 0741  WBC 8.1  NEUTROABS 3.5  HGB 15.6  HCT 45.6  MCV 89.2  PLT 334   Cardiac Enzymes: No results for input(s): CKTOTAL, CKMB, CKMBINDEX, TROPONINI in the last 168 hours. BNP (last 3 results) No results for input(s): PROBNP in the last 8760 hours. CBG: Recent Labs  Lab 05/06/18 1317 05/06/18 1718 05/06/18 2107 05/07/18 0629  GLUCAP 215* 241* 145* 182*   D-Dimer: No results for input(s): DDIMER in the last 72 hours. Hgb A1c: Recent Labs    05/06/18 0741  HGBA1C 11.3*   Lipid Profile: Recent Labs    05/07/18 0616  CHOL 211*  HDL 34*  LDLCALC 133*  TRIG 220*  CHOLHDL 6.2   Thyroid function studies: Recent Labs    05/06/18 1945  TSH 1.891   Anemia work up: No results for input(s):  VITAMINB12, FOLATE, FERRITIN, TIBC, IRON, RETICCTPCT in the last 72 hours. Sepsis Labs: Recent Labs  Lab 05/06/18 0741  WBC 8.1   Microbiology No results found for this or any previous visit (from the past 240 hour(s)).   Medications:   . aspirin EC  81 mg Oral Daily  . atorvastatin  80 mg Oral Daily  . clopidogrel  75 mg Oral Daily  . enoxaparin (LOVENOX) injection  40 mg  Subcutaneous Q24H  . insulin aspart  0-15 Units Subcutaneous TID WC  . insulin aspart  0-5 Units Subcutaneous QHS   Continuous Infusions: . sodium chloride 50 mL/hr at 05/06/18 1137      LOS: 1 day   Charlynne Cousins  [facility]  05/07/2018, 8:05 AM

## 2018-05-07 NOTE — Evaluation (Signed)
Occupational Therapy Evaluation Patient Details Name: Arthur Howard MRN: [id number] DOB: 12-22-1965 Today's Date: 05/07/2018    History of Present Illness Arthur Howard is a 53 y.o. male hyperlipidemia and diabetes.  Patient does not speak English so the majority of the history was obtained from the wife.  Per wife at approximately 1830 hrs. on 05/05/2018 patient noted that his right arm and leg were weak and he was having difficulty with his balance.  This a.m. patient got up and was getting ready for work but felt weak and was unable to brush his teeth.  He then went to work and noticed that his right arm and leg were still weak.  The family also noticed that he is having some slurred speech.  They also noticed a right facial droop.  For these reasons patient came to the ED.  CT was obtained showing a small age uncertain infarct involving a portion of the January and posterior limb of the right internal capsule.  MRA of the brain revealed small acute infarction of the left inferior ventral pons.   Clinical Impression   Pt currently min to mod assist for selfcare tasks, functional mobility, transfers at this time.  He presents with increased weakness and hemiplegia in the RUE and RLE with the RUE being slightly more impaired.  Mod facilitation needed to incorporate the RUE for selfcare tasks such as grooming, bathing, and dressing.  Feel pt will benefit from acute care OT to help increase independence, but feel pt needs more therapy daily from CIR level as soon as possible.  He is an excellent candidate with good family support.      Follow Up Recommendations  CIR;Supervision/Assistance - 24 hour    Equipment Recommendations  Other (comment)(TBD next venue of care)    Recommendations for Other Services Rehab consult     Precautions / Restrictions Precautions Precautions: Fall Precaution Comments: right hemiparesis Restrictions Weight Bearing Restrictions: No      Mobility Bed  Mobility Overal bed mobility: Needs Assistance Bed Mobility: Supine to Sit;Sit to Supine     Supine to sit: Min guard Sit to supine: Min guard   General bed mobility comments: Pt able to transfer to the EOB with supervision overall  Transfers Overall transfer level: Needs assistance Equipment used: 1 person hand held assist Transfers: Sit to/from [facility] Sit to Stand: Min assist Stand pivot transfers: Min assist       General transfer comment: Pt with increased RLE weakness resulting in occasional LOB to the right as well as LOB posteriorly with initial sit to stand from the bed.  Pt needing use of his LEs against the bed to stand at first attempt.    Balance Overall balance assessment: Needs assistance   Sitting balance-Leahy Scale: Fair       Standing balance-Leahy Scale: Poor Standing balance comment: Needs min assist or greater without use of an assistive device with transfers and mobility.                           ADL either performed or assessed with clinical judgement   ADL Overall ADL's : Needs assistance/impaired Eating/Feeding: Supervision/ safety   Grooming: Wash/dry hands;Minimal assistance;Standing   Upper Body Bathing: Minimal assistance;Sitting   Lower Body Bathing: Minimal assistance;Sit to/from stand   Upper Body Dressing : Minimal assistance;Sitting   Lower Body Dressing: Moderate assistance;Sit to/from stand   Toilet Transfer: Minimal assistance;Ambulation;Regular Museum/gallery exhibitions officer  and Hygiene: Minimal assistance;Sit to/from stand       Functional mobility during ADLs: Minimal assistance;+2 for safety/equipment General ADL Comments: Pt able to ambulate with min assist mostly and occasional mod assist with LOB to the right during hand held assist.  Plus two only needed for equimpment only.  He demonstrates greater impairment with RUE function compared to the RLE.  Mod assist needed to  integrate the RUE into selfcare tasks.  Educated pt/family on AAROM exercises for the shoulder, elbow, and hand.  Pt/family able to return demonstrate.  Good CIR candidate for follow-up therapy.       Vision Baseline Vision/History: No visual deficits Patient Visual Report: No change from baseline Vision Assessment?: No apparent visual deficits Additional Comments: No noted deficits with gross testing.  Will continue to look at further during treatment.            Pertinent Vitals/Pain Pain Assessment: No/denies pain     Hand Dominance Right   Extremity/Trunk Assessment Upper Extremity Assessment Upper Extremity Assessment: RUE deficits/detail RUE Deficits / Details: Pt currently exhibits synergy pattern with shoulder and elbow movements at Brunnstrum stage IV.  Full AROM digit flexion and extension but pt only able to oppose thumb to the first and second digits only RUE Sensation: WNL RUE Coordination: decreased fine motor;decreased gross motor   Lower Extremity Assessment Lower Extremity Assessment: Defer to PT evaluation   Cervical / Trunk Assessment Cervical / Trunk Assessment: Normal   Communication     Cognition Arousal/Alertness: Awake/alert Behavior During Therapy: WFL for tasks assessed/performed Overall Cognitive Status: Within Functional Limits for tasks assessed                                                Home Living Family/patient expects to be discharged to:: Private residence Living Arrangements: Children;Spouse/significant other   Type of Home: House Home Access: Stairs to enter Technical brewer of Steps: 1 Entrance Stairs-Rails: None Home Layout: One level     Bathroom Shower/Tub: Occupational psychologist: Standard Bathroom Accessibility: Yes How Accessible: Accessible via walker Home Equipment: None          Prior Functioning/Environment Level of Independence: Independent        Comments: Worked  putting in Database administrator Problem List: Decreased strength;Decreased activity tolerance;Decreased range of motion;Impaired balance (sitting and/or standing);Impaired UE functional use;Decreased knowledge of use of DME or AE;Decreased coordination      OT Treatment/Interventions: Self-care/ADL training;Balance training;DME and/or AE instruction;Therapeutic exercise;Neuromuscular education;Therapeutic activities;Patient/family education;Manual therapy    OT Goals(Current goals can be found in the care plan section) Acute Rehab OT Goals Patient Stated Goal: Get back to being independent for himself and using his right arm better. OT Goal Formulation: With patient/family Time For Goal Achievement: 05/14/18 Potential to Achieve Goals: Good  OT Frequency: Min 3X/week           Co-evaluation       OT goals addressed during session: ADL's and self-care      AM-PAC OT "6 Clicks" Daily Activity     Outcome Measure Help from another person eating meals?: A Little Help from another person taking care of personal grooming?: A Little Help from another person toileting, which includes using toliet, bedpan, or urinal?: A Little Help from another person bathing (including washing, rinsing,  drying)?: A Little Help from another person to put on and taking off regular upper body clothing?: A Little Help from another person to put on and taking off regular lower body clothing?: A Lot 6 Click Score: 17   End of Session Equipment Utilized During Treatment: Gait belt Nurse Communication: Mobility status  Activity Tolerance: Patient tolerated treatment well;No increased pain Patient left: in bed;with call bell/phone within reach;with nursing/sitter in room;with family/visitor present  OT Visit Diagnosis: Unsteadiness on feet (R26.81);Muscle weakness (generalized) (M62.81);Hemiplegia and hemiparesis Hemiplegia - Right/Left: Right Hemiplegia - dominant/non-dominant: Dominant Hemiplegia -  caused by: Cerebral infarction                Time: 3435-6861 OT Time Calculation (min): 35 min Charges:  OT Evaluation $OT Eval Moderate Complexity: 1 Mod   Kyshaun Barnette OTR/L 05/07/2018, 1:07 PM

## 2018-05-07 NOTE — Evaluation (Addendum)
Physical Therapy Evaluation Patient Details Name: Arthur Howard MRN: [id number] DOB: 03/19/1966 Today's Date: 05/07/2018   History of Present Illness  Pt is 53 y/o male admitted secondary to sudden onset of R facial droop and R sided weakness. 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 PMH including but not limited to DM.    Clinical Impression  Pt presented supine in bed with HOB elevated, awake and willing to participate in therapy session. Pt's family members requesting to interpret for pt throughout. Prior to admission, pt reported that he was independent with all functional mobility and ADLs. He worked as an Clinical biochemist. Pt lives with his mother and sister in a single level home with one step to enter. He currently requires min guard for bed mobility, min A for transfers and min-mod A to ambulate in hallway while navigating around obstacles. Pt would greatly benefit from further intensive therapy services in CIR to maximize his independence with functional mobility prior to returning home with family support. PT will continue to follow acutely to progress mobility as tolerated.   I have discussed the patient's current level of function related to mobility with the patient and several family members.  They acknowledge understanding of this and do not feel the patient would be able to have their care needs met at home.  They are interested in post-acute rehab in an inpatient setting.   Pt's BP in supine at beginning of session was 137/73 (94) and HR maintained in the mid 50's.     Follow Up Recommendations CIR    Equipment Recommendations  None recommended by PT    Recommendations for Other Services Rehab consult     Precautions / Restrictions Precautions Precautions: Fall Precaution Comments: right hemiparesis Restrictions Weight Bearing Restrictions: No      Mobility  Bed Mobility Overal bed mobility: Needs Assistance Bed Mobility: Supine to  Sit     Supine to sit: Min guard Sit to supine: Min guard   General bed mobility comments: min guard for safety; pt achieveing EOB sitting towards his L side  Transfers Overall transfer level: Needs assistance Equipment used: 1 person hand held assist Transfers: Sit to/from Stand Sit to Stand: Min assist Stand pivot transfers: Min assist       General transfer comment: Pt with increased RLE weakness resulting in occasional LOB to the right as well as LOB posteriorly with initial sit to stand from the bed.  Pt needing use of his LEs against the bed to stand at first attempt.  Ambulation/Gait Ambulation/Gait assistance: Min assist;Mod assist Gait Distance (Feet): 100 Feet Assistive device: None Gait Pattern/deviations: Step-through pattern;Decreased step length - left;Decreased step length - right;Decreased stride length;Decreased weight shift to right;Decreased dorsiflexion - right;Narrow base of support;Drifts right/left Gait velocity: decreased   General Gait Details: pt with modest instability requiring constant min A for safety and balance as well as mod A x3 for LOB laterally with higher level balance tasks  Stairs            Wheelchair Mobility    Modified Rankin (Stroke Patients Only) Modified Rankin (Stroke Patients Only) Pre-Morbid Rankin Score: No symptoms Modified Rankin: Moderate disability     Balance Overall balance assessment: Needs assistance Sitting-balance support: Feet supported Sitting balance-Leahy Scale: Fair     Standing balance support: During functional activity;No upper extremity supported Standing balance-Leahy Scale: Poor Standing balance comment: Needs min assist or greater without use of an assistive device with transfers and  mobility.                             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  Full AROM digit flexion and extension but pt only able to oppose thumb to the first and second digits only RUE Sensation: WNL RUE Coordination: decreased fine motor;decreased gross motor    Lower Extremity Assessment Lower Extremity Assessment: RLE deficits/detail RLE Deficits / Details: pt grossly 3+/5 for hip flexion, knee flexion, knee extension and ankle DF; 4/5 for hip adduction and abduction    Cervical / Trunk Assessment Cervical / Trunk Assessment: Normal  Communication   Communication: Prefers language other than English  Cognition Arousal/Alertness: Awake/alert Behavior During Therapy: WFL for tasks assessed/performed Overall Cognitive Status: Within Functional Limits for tasks assessed                                        General Comments      Exercises     Assessment/Plan    PT Assessment Patient needs continued PT services  PT Problem List Decreased strength;Decreased balance;Decreased mobility;Decreased coordination;Decreased knowledge of use of DME;Decreased safety awareness;Decreased knowledge of precautions       PT Treatment Interventions DME instruction;Gait training;Stair training;Functional mobility training;Therapeutic activities;Therapeutic exercise;Balance training;Neuromuscular  re-education;Patient/family education    PT Goals (Current goals can be found in the Care Plan section)  Acute Rehab PT Goals Patient Stated Goal: return to independence PT Goal Formulation: With patient/family Time For Goal Achievement: 05/21/18 Potential to Achieve Goals: Good    Frequency Min 4X/week   Barriers to discharge        Co-evaluation PT/OT/SLP Co-Evaluation/Treatment: Yes Reason for Co-Treatment: For patient/therapist safety;To address functional/ADL transfers PT goals addressed during session: Mobility/safety with mobility;Balance;Strengthening/ROM OT goals addressed during session: ADL's and self-care       AM-PAC PT "6 Clicks" Mobility  Outcome Measure Help needed turning from your back to your side while in a flat bed without using bedrails?: None Help needed moving from lying on your back to sitting on the side of a flat bed without using bedrails?: None Help needed moving to and from a bed to a chair (including a wheelchair)?: A Little Help needed standing up from a chair using your arms (e.g., wheelchair or bedside chair)?: A Little Help needed to walk in hospital room?: A Lot Help needed climbing 3-5 steps with a railing? : A Lot 6 Click Score: 18    End of Session Equipment Utilized During Treatment: Gait belt Activity Tolerance: Patient tolerated treatment well Patient left: in bed;with call bell/phone within reach;with family/visitor present;Other (comment)(OT still present) Nurse Communication: Mobility status PT Visit Diagnosis: Other  abnormalities of gait and mobility (R26.89);Hemiplegia and hemiparesis Hemiplegia - Right/Left: Right Hemiplegia - dominant/non-dominant: Dominant Hemiplegia - caused by: Cerebral infarction    Time: 1102-1128 PT Time Calculation (min) (ACUTE ONLY): 26 min   Charges:   PT Evaluation $PT Eval Moderate Complexity: 1 Mod          Sherie Don, PT, DPT  Acute Rehabilitation Services Pager  [phone] Office [place] 05/07/2018, 1:45 PM

## 2018-05-08 LAB — GLUCOSE, CAPILLARY
Glucose-Capillary: 201 mg/dL — ABNORMAL HIGH (ref 70–99)
Glucose-Capillary: 271 mg/dL — ABNORMAL HIGH (ref 70–99)
Glucose-Capillary: 257 mg/dL — ABNORMAL HIGH (ref 70–99)
Glucose-Capillary: 104 mg/dL — ABNORMAL HIGH (ref 70–99)
Glucose-Capillary: 117 mg/dL — ABNORMAL HIGH (ref 70–99)

## 2018-05-08 MED ORDER — INSULIN DETEMIR 100 UNIT/ML ~~LOC~~ SOLN: 5.0000 [IU] | Freq: Two times a day (BID) | SUBCUTANEOUS | Status: DC

## 2018-05-08 MED ORDER — GLIPIZIDE 5 MG PO TABS
2.5000 mg | ORAL_TABLET | Freq: Two times a day (BID) | ORAL | Status: DC
  Administered 2018-05-08: 2.5 mg via ORAL
  Filled 2018-05-08: qty 1

## 2018-05-08 MED ORDER — METFORMIN HCL 500 MG PO TABS
1000.0000 mg | ORAL_TABLET | Freq: Two times a day (BID) | ORAL | Status: DC
  Administered 2018-05-08 – 2018-05-11 (×6): 1000 mg via ORAL
  Filled 2018-05-08 (×6): qty 2

## 2018-05-08 NOTE — Progress Notes (Signed)
[facility] PROGRESS NOTE    Progress Note  Arthur Howard  UXL:[id number] DOB: November 13, 1965 DOA: 05/06/2018 PCP: Patient, No Pcp Per     Brief Narrative:   Arthur Howard is an 53 y.o. male past medical history significant for diabetes, hyperlipidemia and weakness noticed right lower extremity and right upper extremity numbness and weakness.  He was getting up for work and he felt very weak and unable to brush his teeth.  He went to work and noticed his right arm and leg were still weak.  Assessment/Plan:   CVA (cerebral vascular accident) ([place]) HgbA1c 11.3, fasting lipid panel started on statin 80 mg. 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 PT, OT, recommended CIR. CIR physician is been consulted awaiting insurance approval. Echocardiogram normal EF. CT Angie of the head and neck normal. Prophylactic therapy-Antiplatelet med: Dual platelet anti-therapy with aspirin 81 mg and Plavix for 3 weeks then aspirin alone. Permissive hypertension  Elevated blood pressure without a diagnosis of hypertension: Allow for permissive hypertension. Pressure has improved very nicely in the hospital.  Hyperlipidemia: High dose statin therapy with Lipitor 80.  Diabetes mellitus type 2: A1c of 11.3. Restart metformin and glipizide, start on low-dose Lantus.    DVT prophylaxis: lovenox Family Communication:daughter nad wife Disposition Plan/Barrier to D/C: Awaiting CIR placement. Code Status:     Code Status Orders  (From admission, onward)         Start     Ordered   05/06/18 1047  Full code  Continuous     05/06/18 1048        Code Status History    This patient has a current code status but no historical code status.        IV Access:    Peripheral IV   Procedures and diagnostic studies:   Ct Angio Head W Or Wo Contrast  Result Date: 05/06/2018 CLINICAL DATA:  Right-sided weakness and speech disturbance beginning  1830 hours yesterday. EXAM: CT ANGIOGRAPHY HEAD AND NECK TECHNIQUE: Multidetector CT imaging of the head and neck was performed using the standard protocol during bolus administration of intravenous contrast. Multiplanar CT image reconstructions and MIPs were obtained to evaluate the vascular anatomy. Carotid stenosis measurements (when applicable) are obtained utilizing NASCET criteria, using the distal internal carotid diameter as the denominator. CONTRAST:  164mL ISOVUE-370 IOPAMIDOL (ISOVUE-370) INJECTION 76% COMPARISON:  Head CT same day FINDINGS: CTA NECK FINDINGS Aortic arch: Very minimal atherosclerotic calcification. No meaningful atherosclerosis. No aneurysm or dissection. Branching pattern is normal. Right carotid system: Common carotid artery widely patent to the bifurcation. The carotid bifurcation is normal without atherosclerotic disease. Cervical ICA is widely patent to the skull base. Left carotid system: Common carotid artery widely patent to the bifurcation. The carotid bifurcation is normal without atherosclerotic disease. Cervical ICA is normal. Vertebral arteries: Both vertebral artery origins are widely patent. Both vertebral arteries are widely patent through the cervical region to the foramen magnum. Skeleton: Minimal mid cervical spondylosis. Other neck: No mass or lymphadenopathy. Upper chest: Normal Review of the MIP images confirms the above findings CTA HEAD FINDINGS Anterior circulation: Both internal carotid arteries are widely patent through the skull base and siphon regions. The anterior and middle cerebral vessels are patent without proximal stenosis, aneurysm or vascular malformation. Posterior circulation: Both vertebral arteries are widely patent through the foramen magnum to the basilar. No basilar stenosis. Posterior circulation branch vessels are normal. Venous sinuses: Patent and normal. Anatomic variants: None  Delayed phase: No abnormal enhancement. Review of the MIP images  confirms the above findings IMPRESSION: Normal examination.  No brachiocephalic vascular disease. Electronically Signed   By: Nelson Chimes M.D.   On: 05/06/2018 11:28   Ct Angio Neck W Or Wo Contrast  Result Date: 05/06/2018 CLINICAL DATA:  Right-sided weakness and speech disturbance beginning 1830 hours yesterday. EXAM: CT ANGIOGRAPHY HEAD AND NECK TECHNIQUE: Multidetector CT imaging of the head and neck was performed using the standard protocol during bolus administration of intravenous contrast. Multiplanar CT image reconstructions and MIPs were obtained to evaluate the vascular anatomy. Carotid stenosis measurements (when applicable) are obtained utilizing NASCET criteria, using the distal internal carotid diameter as the denominator. CONTRAST:  129mL ISOVUE-370 IOPAMIDOL (ISOVUE-370) INJECTION 76% COMPARISON:  Head CT same day FINDINGS: CTA NECK FINDINGS Aortic arch: Very minimal atherosclerotic calcification. No meaningful atherosclerosis. No aneurysm or dissection. Branching pattern is normal. Right carotid system: Common carotid artery widely patent to the bifurcation. The carotid bifurcation is normal without atherosclerotic disease. Cervical ICA is widely patent to the skull base. Left carotid system: Common carotid artery widely patent to the bifurcation. The carotid bifurcation is normal without atherosclerotic disease. Cervical ICA is normal. Vertebral arteries: Both vertebral artery origins are widely patent. Both vertebral arteries are widely patent through the cervical region to the foramen magnum. Skeleton: Minimal mid cervical spondylosis. Other neck: No mass or lymphadenopathy. Upper chest: Normal Review of the MIP images confirms the above findings CTA HEAD FINDINGS Anterior circulation: Both internal carotid arteries are widely patent through the skull base and siphon regions. The anterior and middle cerebral vessels are patent without proximal stenosis, aneurysm or vascular malformation.  Posterior circulation: Both vertebral arteries are widely patent through the foramen magnum to the basilar. No basilar stenosis. Posterior circulation branch vessels are normal. Venous sinuses: Patent and normal. Anatomic variants: None Delayed phase: No abnormal enhancement. Review of the MIP images confirms the above findings IMPRESSION: Normal examination.  No brachiocephalic vascular disease. Electronically Signed   By: Nelson Chimes M.D.   On: 05/06/2018 11:28   Mr Brain Wo Contrast  Result Date: 05/06/2018 CLINICAL DATA:  Right arm and leg weakness and balance disturbance. Negative CT evaluation. EXAM: MRI HEAD WITHOUT CONTRAST TECHNIQUE: Multiplanar, multiecho pulse sequences of the brain and surrounding structures were obtained without intravenous contrast. COMPARISON:  CT study same day FINDINGS: Brain: Diffusion imaging shows acute infarction in the left ventral inferior pons. No other acute finding. Elsewhere, the brainstem and cerebellum are normal. There is an old small vessel infarction in the right basal ganglia/radiating white matter tracts. There is minimal small vessel ischemic change of the hemispheric white matter. No large vessel territory infarction. No mass lesion, hemorrhage, hydrocephalus or extra-axial collection. Vascular: Major vessels at the base of the brain show flow. Skull and upper cervical spine: Negative Sinuses/Orbits: Clear/normal Other: None IMPRESSION: Small acute infarction of the left inferior ventral pons. No sign of hemorrhage or mass effect. Minimal chronic small-vessel change of the hemispheric white matter elsewhere as described above. Small old lacunar infarction right basal ganglia/radiating white matter tracts. Electronically Signed   By: Nelson Chimes M.D.   On: 05/06/2018 12:51     Medical Consultants:    None.  Anti-Infectives:     Subjective:    Daxen Lanum no complains  Objective:    Vitals:   05/07/18 2039 05/07/18 2354 05/08/18  0634 05/08/18 0831  BP: 115/67 134/71 (!) 141/76 130/72  Pulse: (!) 52  Marland Kitchen)  53 (!) 51  Resp: 18 17  17   Temp: 98.2 F (36.8 C) 97.9 F (36.6 C) 97.7 F (36.5 C) (!) 97.2 F (36.2 C)  TempSrc: Oral Oral Oral Oral  SpO2: 97% 99% 98% 100%    Intake/Output Summary (Last 24 hours) at 05/08/2018 0943 Last data filed at 05/07/2018 1800 Gross per 24 hour  Intake 442 ml  Output -  Net 442 ml   There were no vitals filed for this visit.  Exam: General exam: In no acute distress. Respiratory system: Good air movement and clear to auscultation. Cardiovascular system: S1 & S2 heard, RRR.  Gastrointestinal system: Abdomen is nondistended, soft and nontender.  Central nervous system: Alert and oriented. No focal neurological deficits. Extremities: No pedal edema. Skin: No rashes, lesions or ulcers Psychiatry: Judgement and insight appear normal. Mood & affect appropriate.    Data Reviewed:    Labs: Basic Metabolic Panel: Recent Labs  Lab 05/06/18 0741  NA 135  K 4.2  CL 99  CO2 25  GLUCOSE 297*  BUN 15  CREATININE 0.80  CALCIUM 9.3   GFR CrCl cannot be calculated (Unknown ideal weight.). Liver Function Tests: Recent Labs  Lab 05/06/18 0741  AST 20  ALT 23  ALKPHOS 57  BILITOT 0.9  PROT 6.5  ALBUMIN 3.6   No results for input(s): LIPASE, AMYLASE in the last 168 hours. No results for input(s): AMMONIA in the last 168 hours. Coagulation profile Recent Labs  Lab 05/06/18 0741  INR 0.99    CBC: Recent Labs  Lab 05/06/18 0741  WBC 8.1  NEUTROABS 3.5  HGB 15.6  HCT 45.6  MCV 89.2  PLT 334   Cardiac Enzymes: No results for input(s): CKTOTAL, CKMB, CKMBINDEX, TROPONINI in the last 168 hours. BNP (last 3 results) No results for input(s): PROBNP in the last 8760 hours. CBG: Recent Labs  Lab 05/07/18 1629 05/07/18 2110 05/07/18 2205 05/08/18 0638 05/08/18 0836  GLUCAP 172* 210* 283* 201* 271*   D-Dimer: No results for input(s): DDIMER in the last  72 hours. Hgb A1c: Recent Labs    05/06/18 0741  HGBA1C 11.3*   Lipid Profile: Recent Labs    05/07/18 0616  CHOL 211*  HDL 34*  LDLCALC 133*  TRIG 220*  CHOLHDL 6.2   Thyroid function studies: Recent Labs    05/06/18 1945  TSH 1.891   Anemia work up: No results for input(s): VITAMINB12, FOLATE, FERRITIN, TIBC, IRON, RETICCTPCT in the last 72 hours. Sepsis Labs: Recent Labs  Lab 05/06/18 0741  WBC 8.1   Microbiology No results found for this or any previous visit (from the past 240 hour(s)).   Medications:   . aspirin EC  81 mg Oral Daily  . atorvastatin  80 mg Oral Daily  . clopidogrel  75 mg Oral Daily  . enoxaparin (LOVENOX) injection  40 mg Subcutaneous Q24H  . insulin aspart  0-15 Units Subcutaneous TID WC  . insulin aspart  0-5 Units Subcutaneous QHS   Continuous Infusions: . sodium chloride 50 mL/hr at 05/07/18 0908      LOS: 2 days   Charlynne Cousins  [facility]  05/08/2018, 9:43 AM

## 2018-05-09 LAB — GLUCOSE, CAPILLARY
GLUCOSE-CAPILLARY: 162 mg/dL — AB (ref 70–99)
GLUCOSE-CAPILLARY: 171 mg/dL — AB (ref 70–99)
Glucose-Capillary: 177 mg/dL — ABNORMAL HIGH (ref 70–99)
Glucose-Capillary: 186 mg/dL — ABNORMAL HIGH (ref 70–99)
Glucose-Capillary: 171 mg/dL — ABNORMAL HIGH (ref 70–99)

## 2018-05-09 MED ORDER — GLIPIZIDE 5 MG PO TABS
5.0000 mg | ORAL_TABLET | Freq: Two times a day (BID) | ORAL | Status: DC
  Administered 2018-05-09 – 2018-05-11 (×4): 5 mg via ORAL
  Filled 2018-05-09 (×5): qty 1

## 2018-05-09 NOTE — Progress Notes (Signed)
[facility] PROGRESS NOTE    Progress Note  Arthur Howard  KWI:[id number] DOB: [birth year] DOA: 05/06/2018 PCP: Patient, No Pcp Per     Brief Narrative:   Arthur Howard is an 53 y.o. male past medical history significant for diabetes, hyperlipidemia and weakness noticed right lower extremity and right upper extremity numbness and weakness.  He was getting up for work and he felt very weak and unable to brush his teeth.  He went to work and noticed his right arm and leg were still weak.  Assessment/Plan:   CVA (cerebral vascular accident) ([place]) HgbA1c 11.3, fasting lipid panel started on statin 80 mg. CIR physician is been consulted awaiting insurance approval. Prophylactic therapy-Antiplatelet med: Dual platelet anti-therapy with aspirin 81 mg and Plavix for 3 weeks then aspirin alone. Permissive hypertension  Elevated blood pressure without a diagnosis of hypertension: Allow for permissive hypertension. Pressure has improved very nicely in the hospital.  Hyperlipidemia: High dose statin therapy with Lipitor 80.  Diabetes mellitus type 2: A1c of 11.3. Continue metformin includes glipizide.  Continue sliding scale insulin.   DVT prophylaxis: lovenox Family Communication:daughter nad wife Disposition Plan/Barrier to D/C: Awaiting CIR placement. Code Status:     Code Status Orders  (From admission, onward)         Start     Ordered   05/06/18 1047  Full code  Continuous     05/06/18 1048        Code Status History    This patient has a current code status but no historical code status.        IV Access:    Peripheral IV   Procedures and diagnostic studies:   No results found.   Medical Consultants:    None.  Anti-Infectives:     Subjective:    Arthur Howard no complains  Objective:    Vitals:   05/08/18 2325 05/09/18 0000 05/09/18 0350 05/09/18 0735  BP:  139/68 122/60 116/61  Pulse: (!) 47  (!) 49   Resp: 16 16 16  18   Temp: 97.7 F (36.5 C) (!) 96.2 F (35.7 C) 97.8 F (36.6 C) 97.8 F (36.6 C)  TempSrc: Oral Axillary Oral Oral  SpO2: 99% 97% 97% 100%   No intake or output data in the 24 hours ending 05/09/18 0833 There were no vitals filed for this visit.  Exam: General exam: In no acute distress. Respiratory system: Good air movement and clear to auscultation. Cardiovascular system: S1 & S2 heard, RRR.  Gastrointestinal system: Abdomen is nondistended, soft and nontender.  Central nervous system: Alert and oriented x3 facial droop and right lower and upper extremity weakness. Extremities: No pedal edema. Skin: No rashes, lesions or ulcers Psychiatry: Judgement and insight appear normal. Mood & affect appropriate.    Data Reviewed:    Labs: Basic Metabolic Panel: Recent Labs  Lab 05/06/18 0741  NA 135  K 4.2  CL 99  CO2 25  GLUCOSE 297*  BUN 15  CREATININE 0.80  CALCIUM 9.3   GFR CrCl cannot be calculated (Unknown ideal weight.). Liver Function Tests: Recent Labs  Lab 05/06/18 0741  AST 20  ALT 23  ALKPHOS 57  BILITOT 0.9  PROT 6.5  ALBUMIN 3.6   No results for input(s): LIPASE, AMYLASE in the last 168 hours. No results for input(s): AMMONIA in the last 168 hours. Coagulation profile Recent Labs  Lab 05/06/18 0741  INR 0.99    CBC: Recent Labs  Lab 05/06/18 0741  WBC  8.1  NEUTROABS 3.5  HGB 15.6  HCT 45.6  MCV 89.2  PLT 334   Cardiac Enzymes: No results for input(s): CKTOTAL, CKMB, CKMBINDEX, TROPONINI in the last 168 hours. BNP (last 3 results) No results for input(s): PROBNP in the last 8760 hours. CBG: Recent Labs  Lab 05/08/18 0836 05/08/18 1233 05/08/18 1640 05/08/18 2131 05/09/18 0613  GLUCAP 271* 257* 104* 117* 162*   D-Dimer: No results for input(s): DDIMER in the last 72 hours. Hgb A1c: No results for input(s): HGBA1C in the last 72 hours. Lipid Profile: Recent Labs    05/07/18 0616  CHOL 211*  HDL 34*  LDLCALC 133*    TRIG 220*  CHOLHDL 6.2   Thyroid function studies: Recent Labs    05/06/18 1945  TSH 1.891   Anemia work up: No results for input(s): VITAMINB12, FOLATE, FERRITIN, TIBC, IRON, RETICCTPCT in the last 72 hours. Sepsis Labs: Recent Labs  Lab 05/06/18 0741  WBC 8.1   Microbiology No results found for this or any previous visit (from the past 240 hour(s)).   Medications:   . aspirin EC  81 mg Oral Daily  . atorvastatin  80 mg Oral Daily  . clopidogrel  75 mg Oral Daily  . enoxaparin (LOVENOX) injection  40 mg Subcutaneous Q24H  . glipiZIDE  2.5 mg Oral BID AC  . insulin aspart  0-15 Units Subcutaneous TID WC  . insulin aspart  0-5 Units Subcutaneous QHS  . metFORMIN  1,000 mg Oral BID WC   Continuous Infusions: . sodium chloride 50 mL/hr at 05/09/18 0307      LOS: 3 days   Arthur Howard  [facility]  05/09/2018, 8:33 AM

## 2018-05-09 NOTE — Plan of Care (Signed)
Patient stable, discussed POC with patient and family, agreeable with plan, denies question/concerns at this time.  

## 2018-05-10 LAB — GLUCOSE, CAPILLARY
Glucose-Capillary: 93 mg/dL (ref 70–99)
Glucose-Capillary: 209 mg/dL — ABNORMAL HIGH (ref 70–99)
Glucose-Capillary: 106 mg/dL — ABNORMAL HIGH (ref 70–99)
Glucose-Capillary: 111 mg/dL — ABNORMAL HIGH (ref 70–99)

## 2018-05-10 NOTE — Progress Notes (Signed)
[facility] PROGRESS NOTE    Progress Note  Arthur Howard  HKV:[id number] DOB: 1965-09-12 DOA: 05/06/2018 PCP: Patient, No Pcp Per     Brief Narrative:   Arthur Howard is an 53 y.o. male past medical history significant for diabetes, hyperlipidemia and weakness noticed right lower extremity and right upper extremity numbness and weakness.  He was getting up for work and he felt very weak and unable to brush his teeth.  He went to work and noticed his right arm and leg were still weak.  Assessment/Plan:   CVA (cerebral vascular accident) ([place]) HgbA1c 11.3, fasting lipid panel started on statin 80 mg. CIR physician is been consulted awaiting insurance approval. Prophylactic therapy-Antiplatelet med: Dual platelet anti-therapy with aspirin 81 mg and Plavix for 3 weeks then aspirin alone. Blood pressure under great control with no antihypertensive medication.  Elevated blood pressure without a diagnosis of hypertension: Allow for permissive hypertension. Pressure has improved very nicely in the hospital.  Hyperlipidemia: High dose statin therapy with Lipitor 80.  Diabetes mellitus type 2: A1c of 11.3. Continue metformin and glipizide.  Continue sliding scale insulin.   DVT prophylaxis: lovenox Family Communication:daughter nad wife Disposition Plan/Barrier to D/C: Awaiting CIR placement. Code Status:     Code Status Orders  (From admission, onward)         Start     Ordered   05/06/18 1047  Full code  Continuous     05/06/18 1048        Code Status History    This patient has a current code status but no historical code status.        IV Access:    Peripheral IV   Procedures and diagnostic studies:   No results found.   Medical Consultants:    None.  Anti-Infectives:     Subjective:    Arthur Howard no complains  Objective:    Vitals:   05/09/18 2329 05/10/18 0000 05/10/18 0342 05/10/18 0836  BP: 114/60  126/67  118/71  Pulse: (!) 51  (!) 52 (!) 56  Resp: 18 18  15   Temp: 97.8 F (36.6 C)  98.2 F (36.8 C) 98 F (36.7 C)  TempSrc: Oral  Oral Oral  SpO2: 97%  98% 98%    Intake/Output Summary (Last 24 hours) at 05/10/2018 0945 Last data filed at 05/10/2018 5643 Gross per 24 hour  Intake 845 ml  Output -  Net 845 ml   There were no vitals filed for this visit.  Exam: General exam: In no acute distress. Respiratory system: Good air movement and clear to auscultation. Cardiovascular system: S1 & S2 heard, RRR.  Gastrointestinal system: Abdomen is nondistended, soft and nontender.  Central nervous system: Alert and oriented x3 facial droop and right lower and upper extremity weakness. Extremities: No pedal edema. Skin: No rashes, lesions or ulcers Psychiatry: Judgement and insight appear normal. Mood & affect appropriate.    Data Reviewed:    Labs: Basic Metabolic Panel: Recent Labs  Lab 05/06/18 0741  NA 135  K 4.2  CL 99  CO2 25  GLUCOSE 297*  BUN 15  CREATININE 0.80  CALCIUM 9.3   GFR CrCl cannot be calculated (Unknown ideal weight.). Liver Function Tests: Recent Labs  Lab 05/06/18 0741  AST 20  ALT 23  ALKPHOS 57  BILITOT 0.9  PROT 6.5  ALBUMIN 3.6   No results for input(s): LIPASE, AMYLASE in the last 168 hours. No results for input(s): AMMONIA in the last 168  hours. Coagulation profile Recent Labs  Lab 05/06/18 0741  INR 0.99    CBC: Recent Labs  Lab 05/06/18 0741  WBC 8.1  NEUTROABS 3.5  HGB 15.6  HCT 45.6  MCV 89.2  PLT 334   Cardiac Enzymes: No results for input(s): CKTOTAL, CKMB, CKMBINDEX, TROPONINI in the last 168 hours. BNP (last 3 results) No results for input(s): PROBNP in the last 8760 hours. CBG: Recent Labs  Lab 05/09/18 1152 05/09/18 1734 05/09/18 1736 05/09/18 2144 05/10/18 0603  GLUCAP 177* 171* 186* 171* 93   D-Dimer: No results for input(s): DDIMER in the last 72 hours. Hgb A1c: No results for input(s): HGBA1C in  the last 72 hours. Lipid Profile: No results for input(s): CHOL, HDL, LDLCALC, TRIG, CHOLHDL, LDLDIRECT in the last 72 hours. Thyroid function studies: No results for input(s): TSH, T4TOTAL, T3FREE, THYROIDAB in the last 72 hours.  Invalid input(s): FREET3 Anemia work up: No results for input(s): VITAMINB12, FOLATE, FERRITIN, TIBC, IRON, RETICCTPCT in the last 72 hours. Sepsis Labs: Recent Labs  Lab 05/06/18 0741  WBC 8.1   Microbiology No results found for this or any previous visit (from the past 240 hour(s)).   Medications:   . aspirin EC  81 mg Oral Daily  . atorvastatin  80 mg Oral Daily  . clopidogrel  75 mg Oral Daily  . enoxaparin (LOVENOX) injection  40 mg Subcutaneous Q24H  . glipiZIDE  5 mg Oral BID AC  . insulin aspart  0-15 Units Subcutaneous TID WC  . insulin aspart  0-5 Units Subcutaneous QHS  . metFORMIN  1,000 mg Oral BID WC   Continuous Infusions: . sodium chloride Stopped (05/09/18 1830)      LOS: 4 days   Charlynne Cousins  [facility]  05/10/2018, 9:45 AM

## 2018-05-10 NOTE — Progress Notes (Signed)
Notified by CCMD that the pt did not have an active order for telemetry. Spoke with Dr. Aileen Fass - okay to discontinue telemetry. CCMD made aware.   Fritz Pickerel, RN

## 2018-05-10 NOTE — Progress Notes (Signed)
Occupational Therapy Treatment Patient Details Name: Arthur Howard MRN: [id number] DOB: Jan 24, 1966 Today's Date: 05/10/2018    History of present illness Pt is 53 y/o male admitted secondary to sudden onset of R facial droop and R sided weakness. 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 PMH including but not limited to DM.   OT comments  Pt with slight regression in functional movement in the RUE compared to eval on 12/14.  He currently demonstrates only trace digit flexion/extension in the RUE where as on Friday he could demonstrate almost full AROM in the digits with ability to oppose the thumb to the first and second digits.  Mod assist now for functional mobility and transfers without an assistive device, with noted right knee buckling at times.  Recommend continued CIR level rehab based on current level of assist.    Follow Up Recommendations  CIR;Supervision/Assistance - 24 hour    Equipment Recommendations  Other (comment)(TBD next venue of care)    Recommendations for Other Services Rehab consult    Precautions / Restrictions Precautions Precautions: Fall Precaution Comments: right hemiparesis Restrictions Weight Bearing Restrictions: No       Mobility Bed Mobility Overal bed mobility: Needs Assistance Bed Mobility: Supine to Sit     Supine to sit: Min guard        Transfers Overall transfer level: Needs assistance Equipment used: 1 person hand held assist Transfers: Sit to/from Stand Sit to Stand: Mod assist Stand pivot transfers: Mod assist       General transfer comment: Pt with posterior LOB in standing as well as occasional right knee buckling with mobility.      Balance Overall balance assessment: Needs assistance Sitting-balance support: Feet supported Sitting balance-Leahy Scale: Fair       Standing balance-Leahy Scale: Poor Standing balance comment: Mod assist for dynamic standing balance during simulated selfcare  tasks and transfers.                            ADL either performed or assessed with clinical judgement   ADL Overall ADL's : Needs assistance/impaired                         Toilet Transfer: Moderate assistance;Ambulation   Toileting- Clothing Manipulation and Hygiene: Moderate assistance;Sit to/from stand       Functional mobility during ADLs: Moderate assistance General ADL Comments: Pt with more physical impairment noted than on 05/07/18 eval.  Pt now with only trace digit extension and flexion where as on Friday he exhibited almost full gross AROM for flexion and extension.  He also needed mod assist for functional mobility hand held assist with noted right knee buckline this session, which was only min assist on Friday.  Educated pt and family on AAROM exercises for the RUE and hand as well as providing handout.  Pt is an excellent CIR level candidate at this time.      Vision       Perception     Praxis      Cognition Arousal/Alertness: Awake/alert Behavior During Therapy: WFL for tasks assessed/performed Overall Cognitive Status: Within Functional Limits for tasks assessed  Pertinent Vitals/ Pain       Pain Assessment: No/denies pain         Frequency  Min 3X/week        Progress Toward Goals  OT Goals(current goals can now be found in the care plan section)  Progress towards OT goals: Progressing toward goals     Plan Discharge plan remains appropriate       AM-PAC OT "6 Clicks" Daily Activity     Outcome Measure   Help from another person eating meals?: A Little Help from another person taking care of personal grooming?: A Little Help from another person toileting, which includes using toliet, bedpan, or urinal?: A Lot Help from another person bathing (including washing, rinsing, drying)?: A Lot Help from another person to put on and taking off regular  upper body clothing?: A Little Help from another person to put on and taking off regular lower body clothing?: A Lot 6 Click Score: 15    End of Session Equipment Utilized During Treatment: Gait belt  OT Visit Diagnosis: Unsteadiness on feet (R26.81);Muscle weakness (generalized) (M62.81);Hemiplegia and hemiparesis Hemiplegia - Right/Left: Right Hemiplegia - dominant/non-dominant: Dominant Hemiplegia - caused by: Cerebral infarction   Activity Tolerance Patient tolerated treatment well;No increased pain   Patient Left in chair;with chair alarm set;with family/visitor present   Nurse Communication Mobility status        Time: 2482-5003 OT Time Calculation (min): 46 min  Charges: OT General Charges $OT Visit: 1 Visit OT Treatments $Neuromuscular Re-education: 38-52 mins     Cedar Roseman OTR/L 05/10/2018, 10:03 AM

## 2018-05-10 NOTE — Progress Notes (Signed)
Physical Therapy Treatment Patient Details Name: Arthur Howard MRN: [id number] DOB: [birth year] Today's Date: 05/10/2018    History of Present Illness Pt is 53 y/o male admitted secondary to sudden onset of R facial droop and R sided weakness. 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 PMH including but not limited to DM.    PT Comments    Patient received in bed with family present to translate. Patient agrees to ambulate. Requires min assist with bed mobility and sit to stand transfers. Patient ambulated 200 feet with hand held assist. Occasional right knee buckling, decreased foot clearance on right due to difficulty with dorsiflexion. Patient then able to perform seated R LE strengthening exercises. Patient will benefit from inpatient rehab to improve strength and functional independence.       Follow Up Recommendations  CIR     Equipment Recommendations  None recommended by PT    Recommendations for Other Services Rehab consult     Precautions / Restrictions Precautions Precautions: Fall Precaution Comments: right hemiparesis Restrictions Weight Bearing Restrictions: No    Mobility  Bed Mobility Overal bed mobility: Needs Assistance Bed Mobility: Supine to Sit     Supine to sit: Min assist     General bed mobility comments: patient requesting assist from family to perform supine to sit, to raise trunk and move right LE off bed.   Transfers Overall transfer level: Needs assistance Equipment used: 1 person hand held assist Transfers: Sit to/from Stand Sit to Stand: Min assist Stand pivot transfers: Mod assist       General transfer comment: Pt with posterior LOB in standing as well as occasional right knee buckling with mobility.    Ambulation/Gait Ambulation/Gait assistance: Min assist;Mod assist Gait Distance (Feet): 200 Feet Assistive device: 1 person hand held assist Gait Pattern/deviations: Step-to pattern;Shuffle Gait  velocity: decreased   General Gait Details: min/mod hand held assist. Occasional right knee buckling with gait.     Stairs             Wheelchair Mobility    Modified Rankin (Stroke Patients Only)       Balance Overall balance assessment: Needs assistance Sitting-balance support: Feet supported Sitting balance-Leahy Scale: Fair     Standing balance support: Single extremity supported Standing balance-Leahy Scale: Fair Standing balance comment: Mod assist for dynamic standing balance during simulated selfcare tasks and transfers.                             Cognition Arousal/Alertness: Awake/alert Behavior During Therapy: WFL for tasks assessed/performed Overall Cognitive Status: Within Functional Limits for tasks assessed                                        Exercises Other Exercises Other Exercises: seated LAQ, Marching, Toe taps x 10 reps on right with min assist to get full rom with LAQ and marching, max assist needed for toe taps.      General Comments        Pertinent Vitals/Pain Pain Assessment: No/denies pain    Home Living Family/patient expects to be discharged to:: Private residence Living Arrangements: Children;Spouse/significant other                  Prior Function            PT Goals (current goals  can now be found in the care plan section) Acute Rehab PT Goals Patient Stated Goal: return to independence PT Goal Formulation: With patient/family Time For Goal Achievement: 05/21/18 Potential to Achieve Goals: Good Progress towards PT goals: Progressing toward goals    Frequency    Min 4X/week      PT Plan Current plan remains appropriate    Co-evaluation              AM-PAC PT "6 Clicks" Mobility   Outcome Measure  Help needed turning from your back to your side while in a flat bed without using bedrails?: A Little Help needed moving from lying on your back to sitting on the side of  a flat bed without using bedrails?: A Little Help needed moving to and from a bed to a chair (including a wheelchair)?: A Little Help needed standing up from a chair using your arms (e.g., wheelchair or bedside chair)?: A Little Help needed to walk in hospital room?: A Lot Help needed climbing 3-5 steps with a railing? : A Lot 6 Click Score: 16    End of Session Equipment Utilized During Treatment: Gait belt Activity Tolerance: Patient tolerated treatment well Patient left: in bed;with call bell/phone within reach;with family/visitor present Nurse Communication: Mobility status PT Visit Diagnosis: Muscle weakness (generalized) (M62.81);Difficulty in walking, not elsewhere classified (R26.2);Unsteadiness on feet (R26.81);Hemiplegia and hemiparesis Hemiplegia - Right/Left: Right Hemiplegia - dominant/non-dominant: Dominant Hemiplegia - caused by: Cerebral infarction     Time: 1305-1330 PT Time Calculation (min) (ACUTE ONLY): 25 min  Charges:  $Gait Training: 8-22 mins $Therapeutic Exercise: 8-22 mins                     Waylin Dorko, PT, GCS 05/10/18,1:40 PM

## 2018-05-10 NOTE — H&P (Signed)
Physical Medicine and Rehabilitation Admission H&P    Chief Complaint  Patient presents with  . Weakness  : HPI: Arthur Howard is a 53 year old right handed non-English-speaking male with history of tobacco abuse, diabetes mellitus, hyperlipidemia. Per chart review and daughter, patient lives with wife and daughter. 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 Presented to 04/26/2018 with right sided weakness and facial droop as well as some slurred speech. Cranial CT scan showed small age uncertain infarct involving a portion of the genuine and posterior limb of right internal capsule. Urine drug screen negative. CT angiogram of head and neck negative. Patient did not receive TPA. 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 No signs of hemorrhage. Echocardiogram with ejection fraction of 65%. Presently maintained on aspirin and Plavix for CVA prophylaxis 3 weeks then aspirin alone. Subcutaneous Lovenox for DVT prophylaxis. Tolerating a regular diet. Therapy evaluations completed with recommendations of physical medicine rehabilitation consult. Patient was admitted for a comprehensive rehabilitation program.  Review of Systems  Unable to perform ROS: Language  Constitutional: Negative for fever.  HENT: Negative for hearing loss.   Eyes: Negative for blurred vision.  Respiratory: Negative for cough.   Cardiovascular: Negative for chest pain.  Gastrointestinal: Positive for nausea.  Genitourinary: Negative for dysuria.  Musculoskeletal: Negative for myalgias.  Skin: Negative for itching.  Neurological: Positive for dizziness.  Psychiatric/Behavioral: Negative for depression.   Past Medical History:  Diagnosis Date  . Diabetes mellitus without complication ([place])   . Hyperlipidemia    Past Surgical History:  Procedure Laterality Date  . ANKLE SURGERY     Family History  Problem Relation Age of Onset  . Diabetes Sister   . Diabetes  Brother   . CVA Neg Hx    Social History:  reports that he has been smoking. He has never used smokeless tobacco. He reports that he does not drink alcohol or use drugs. Allergies: No Known Allergies Medications Prior to Admission  Medication Sig Dispense Refill  . acetaminophen (TYLENOL) 500 MG tablet Take 500 mg by mouth every 6 (six) hours as needed for mild pain.    Marland Kitchen atorvastatin (LIPITOR) 40 MG tablet Take 1 tablet (40 mg total) by mouth daily. (Patient not taking: Reported on 05/06/2018) 90 tablet 1  . blood glucose meter kit and supplies KIT Dispense based on patient and insurance preference. Use up to four times daily as directed. (FOR ICD-9 250.00, 250.01). (Patient not taking: Reported on 05/30/2017) 1 each 0  . glipiZIDE (GLUCOTROL) 5 MG tablet Take 1 tablet (5 mg total) by mouth 2 (two) times daily before a meal. (Patient not taking: Reported on 05/06/2018) 90 tablet 1  . metFORMIN (GLUCOPHAGE) 1000 MG tablet Take 1 tablet (1,000 mg total) by mouth 2 (two) times daily with a meal. (Patient not taking: Reported on 05/06/2018) 180 tablet 1    Drug Regimen Review Drug regimen was reviewed and remains appropriate with no significant issues identified  Home: Home Living Family/patient expects to be discharged to:: Private residence Living Arrangements: Children, Spouse/significant other Available Help at Discharge: Family, Available 24 hours/day Type of Home: House Home Access: Stairs to enter [employer] of Steps: 1 Entrance Stairs-Rails: None Home Layout: One level Bathroom Shower/Tub: Multimedia programmer: Standard Bathroom Accessibility: Yes Home Equipment: None   Functional History: Prior Function Level of Independence: Independent Comments: Worked putting in Curator Status:  Mobility: Bed Mobility Overal bed mobility: Needs Assistance Bed Mobility:  Supine to Sit Supine to sit: Min assist Sit to supine: Min guard General bed  mobility comments: patient requesting assist from family to perform supine to sit, to raise trunk and move right LE off bed.  Transfers Overall transfer level: Needs assistance Equipment used: 1 person hand held assist Transfers: Sit to/from Stand Sit to Stand: Min assist Stand pivot transfers: Mod assist General transfer comment: Pt with posterior LOB in standing as well as occasional right knee buckling with mobility.   Ambulation/Gait Ambulation/Gait assistance: Min assist, Mod assist Gait Distance (Feet): 200 Feet Assistive device: 1 person hand held assist Gait Pattern/deviations: Step-to pattern, Shuffle General Gait Details: min/mod hand held assist. Occasional right knee buckling with gait.   Gait velocity: decreased    ADL: ADL Overall ADL's : Needs assistance/impaired Eating/Feeding: Supervision/ safety Grooming: Wash/dry hands, Minimal assistance, Standing Upper Body Bathing: Minimal assistance, Sitting Lower Body Bathing: Minimal assistance, Sit to/from stand Upper Body Dressing : Minimal assistance, Sitting Lower Body Dressing: Moderate assistance, Sit to/from stand Toilet Transfer: Moderate assistance, Ambulation Toileting- Clothing Manipulation and Hygiene: Moderate assistance, Sit to/from stand Functional mobility during ADLs: Moderate assistance General ADL Comments: Pt with more physical impairment noted than on 05/07/18 eval.  Pt now with only trace digit extension and flexion where as on Friday he exhibited almost full gross AROM for flexion and extension.  He also needed mod assist for functional mobility hand held assist with noted right knee buckline this session, which was only min assist on Friday.  Educated pt and family on AAROM exercises for the RUE and hand as well as providing handout.  Pt is an excellent CIR level candidate at this time.   Cognition: Cognition Overall Cognitive Status: Within Functional Limits for tasks assessed Arousal/Alertness:  Awake/alert Orientation Level: (P) Oriented X4 Attention: Sustained Sustained Attention: Appears intact Memory: Appears intact Awareness: Appears intact Problem Solving: Appears intact Safety/Judgment: Appears intact Cognition Arousal/Alertness: Awake/alert Behavior During Therapy: WFL for tasks assessed/performed Overall Cognitive Status: Within Functional Limits for tasks assessed  Physical Exam: Blood pressure 123/71, pulse (!) 52, temperature 97.8 F (36.6 C), temperature source Oral, resp. rate 15, SpO2 97 %. Physical Exam  Vitals reviewed. Constitutional: He appears well-developed. No distress.  HENT:  Head: Normocephalic.  Eyes: Pupils are equal, round, and reactive to light. EOM are normal.  Neck: Normal range of motion.  Cardiovascular: Normal rate and regular rhythm. Exam reveals no friction rub.  No murmur heard. Respiratory: Effort normal. No respiratory distress. He has no wheezes.  GI: Soft. He exhibits no distension. There is no abdominal tenderness.  Musculoskeletal: Normal range of motion.        General: No deformity or edema.  Neurological:  Alert Makes good eye contact with examiner. Tracks to all fields. Mild right central 7. Dysarthric. RUE 2/5 deltoid,biceps and 0/5 wrist and HI.Marland Kitchen RLE: 2 to 2+/5 HF, KE and ADF/PF. Senses pain and LT in all 4's. Reasonable insight and awareness, communicates through [place] and help of wife who translates  Skin: Skin is warm.  Psychiatric: He has a normal mood and affect. His behavior is normal.    Results for orders placed or performed during the hospital encounter of 05/06/18 (from the past 48 hour(s))  Glucose, capillary     Status: Abnormal   Collection Time: 05/09/18  6:13 AM  Result Value Ref Range   Glucose-Capillary 162 (H) 70 - 99 mg/dL   Comment 1 Notify RN   Glucose, capillary     Status:  Abnormal   Collection Time: 05/09/18 11:52 AM  Result Value Ref Range   Glucose-Capillary 177 (H) 70 - 99 mg/dL    Glucose, capillary     Status: Abnormal   Collection Time: 05/09/18  5:34 PM  Result Value Ref Range   Glucose-Capillary 171 (H) 70 - 99 mg/dL  Glucose, capillary     Status: Abnormal   Collection Time: 05/09/18  5:36 PM  Result Value Ref Range   Glucose-Capillary 186 (H) 70 - 99 mg/dL  Glucose, capillary     Status: Abnormal   Collection Time: 05/09/18  9:44 PM  Result Value Ref Range   Glucose-Capillary 171 (H) 70 - 99 mg/dL   Comment 1 Notify RN    Comment 2 Document in Chart   Glucose, capillary     Status: None   Collection Time: 05/10/18  6:03 AM  Result Value Ref Range   Glucose-Capillary 93 70 - 99 mg/dL   Comment 1 Notify RN    Comment 2 Document in Chart   Glucose, capillary     Status: Abnormal   Collection Time: 05/10/18 11:22 AM  Result Value Ref Range   Glucose-Capillary 209 (H) 70 - 99 mg/dL   Comment 1 Notify RN    Comment 2 Document in Chart   Glucose, capillary     Status: Abnormal   Collection Time: 05/10/18  4:48 PM  Result Value Ref Range   Glucose-Capillary 106 (H) 70 - 99 mg/dL   Comment 1 Notify RN    Comment 2 Document in Chart   Glucose, capillary     Status: Abnormal   Collection Time: 05/10/18  9:05 PM  Result Value Ref Range   Glucose-Capillary 111 (H) 70 - 99 mg/dL   No results found.     Medical Problem List and Plan: 1.  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  2.  DVT Prophylaxis/Anticoagulation: Subcutaneous Lovenox. Monitor for any bleeding episodes 3. Pain Management:  Tylenol as needed 4. Mood:  Provide emotional support 5. Neuropsych: This patient iis capable of making decisions on his own behalf. 6. Skin/Wound Care:  Routine skin checks 7. Fluids/Electrolytes/Nutrition:  Routine in and out's with follow-up chemistries 8. Diabetes mellitus with peripheral neuropathy. Hemoglobin A1c 11.3. Glucotrol 5 mg twice a day,  Glucophage 1000 mg twice a day.   -pt complains of nausea at times, need to consider metformin as source  -Check blood sugars before meals and at bedtime.   -Diabetic teaching 9. Permissive hypertension. Patient on no antihypertensive medications prior to admission.  -Monitor with increased mobility 10. Hyperlipidemia. Lipitor 11. Tobacco abuse. Counseling    Cathlyn Parsons, PA-C 05/11/2018

## 2018-05-10 NOTE — Progress Notes (Signed)
IP rehab admissions - I met with patient and family today and this past Friday.  I have opened the case with [employer] and we are requesting acute inpatient rehab admission.  Will wait to hear back from insurance case manager.  Patient and family do want to come to CIR.  Call me for questions.  [phone]

## 2018-05-10 NOTE — PMR Pre-admission (Signed)
PMR Admission Coordinator Pre-Admission Assessment  Patient: Arthur Howard is an 53 y.o., male MRN: [id number] DOB: 1965-07-09 Height:   Weight:                Insurance Information HMO:     PPO: Yes     PCP:       IPA:       80/20:       OTHER:   PRIMARY:  [employer] indemnity      Policy#: [id number]      Subscriber: patient CM Name: Luisa Hart     Phone#: [phone]     Fax#: [phone] Pre-Cert#: [id number] from 2/18 to 05/17/18 with updates due to [place] [phone] and fax [phone]     Employer: [employer] Benefits:  Phone #: [phone]     Name: On line portal Eff. Date: 01/22/18     Deduct: $2000 (met $0)      Out of Pocket Max: $5450 (met $0)     Life Max: N/A CIR: 90%      SNF: 90% with 60 days max per year Outpatient: 90% for 60 visits per year     Co-Pay: 10% Home Health: 90% with 60 visits combined      Co-Pay: 105 DME: 90%     Co-Pay: 10% Providers: in network  Medicaid Application Date:       Case Manager:  Disability Application Date:       Case Worker:   Emergency Facilities manager Information    Name Relation Home Work Mobile   [place] Daughter [phone]     [place], Plasencia Daughter [phone]       Current Medical History  Patient Admitting Diagnosis: acute infarct left inferior ventral pons  History of Present Illness: a 53 y.o.right handed non-English-speaking male with history of tobacco abuse,diabetes mellitus hyperlipidemia. Per chart review and daughter, patient lives with wife and daughter. 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 Cranial CT scan showed small age uncertain infarct involving a portion of the genu and posterior limb of the right internal capsule. Urine drug screen pending. CT angiogram of head and neck negative. Patient did not receive TPA.  MRI showed small acute infarct left  inferior ventral pons. No signs of hemorrhage. Echocardiogram completed, read pending. Presently maintained on aspirin and Plavix for CVA prophylaxis. Subcutaneous Lovenox for DVT prophylaxis. Tolerating a regular consistency diet.  Therapy is recommending CIR.  Complete NIHSS TOTAL: 5  Past Medical History  Past Medical History:  Diagnosis Date  . Diabetes mellitus without complication ([place])   . Hyperlipidemia     Family History  family history includes Diabetes in his brother and sister.  Prior Rehab/Hospitalizations: No previous rehab, no previous hospitalizations.  Has the patient had major surgery during 100 days prior to admission? No  Current Medications   Current Facility-Administered Medications:  .  0.9 %  sodium chloride infusion, , Intravenous, Continuous, Karmen Bongo, MD, Stopped at 05/09/18 1830 .  acetaminophen (TYLENOL) tablet 650 mg, 650 mg, Oral, Q4H PRN, 650 mg at 05/08/18 1514 **OR** acetaminophen (TYLENOL) solution 650 mg, 650 mg, Per Tube, Q4H PRN **OR** acetaminophen (TYLENOL) suppository 650 mg, 650 mg, Rectal, Q4H PRN, Karmen Bongo, MD .  aspirin EC tablet 81 mg, 81 mg, Oral, Daily, Aroor, Lanice Schwab, MD, 81 mg at 05/11/18 0919 .  atorvastatin (LIPITOR) tablet 80 mg, 80 mg, Oral,  Daily, Karmen Bongo, MD, 80 mg at 05/11/18 0998 .  clopidogrel (PLAVIX) tablet 75 mg, 75 mg, Oral, Daily, Aroor, Lanice Schwab, MD, 75 mg at 05/11/18 0919 .  enoxaparin (LOVENOX) injection 40 mg, 40 mg, Subcutaneous, Q24H, Karmen Bongo, MD, 40 mg at 05/10/18 1113 .  glipiZIDE (GLUCOTROL) tablet 5 mg, 5 mg, Oral, BID AC, Charlynne Cousins, MD, 5 mg at 05/11/18 [phone] .  insulin aspart (novoLOG) injection 0-15 Units, 0-15 Units, Subcutaneous, TID WC, Karmen Bongo, MD, 5 Units at 05/10/18 1148 .  insulin aspart (novoLOG) injection 0-5 Units, 0-5 Units, Subcutaneous, QHS, Karmen Bongo, MD, 3 Units at 05/07/18 2213 .  metFORMIN (GLUCOPHAGE) tablet 1,000 mg, 1,000 mg, Oral,  BID WC, Charlynne Cousins, MD, 1,000 mg at 05/11/18 0818 .  oxyCODONE (Oxy IR/ROXICODONE) immediate release tablet 5 mg, 5 mg, Oral, Q4H PRN, Karmen Bongo, MD, 5 mg at 05/10/18 2120 .  senna-docusate (Senokot-S) tablet 1 tablet, 1 tablet, Oral, QHS PRN, Karmen Bongo, MD  Patients Current Diet:  Diet Order            Diet - low sodium heart healthy              Precautions / Restrictions Precautions Precautions: Fall Precaution Comments: right hemiparesis Restrictions Weight Bearing Restrictions: No   Has the patient had 2 or more falls or a fall with injury in the past year?No  Prior Activity Level Community (5-7x/wk): Went out daily, was working [employer], was driving.  Home Assistive Devices / Equipment Home Assistive Devices/Equipment: None Home Equipment: None  Prior Device Use: Indicate devices/aids used by the patient prior to current illness, exacerbation or injury? None  Prior Functional Level Prior Function Level of Independence: Independent Comments: Worked putting in Designer, fashion/clothing Care: Did the patient need help bathing, dressing, using the toilet or eating?  Independent  Indoor Mobility: Did the patient need assistance with walking from room to room (with or without device)? Independent  Stairs: Did the patient need assistance with internal or external stairs (with or without device)? Independent  Functional Cognition: Did the patient need help planning regular tasks such as shopping or remembering to take medications? Independent  Current Functional Level Cognition  Arousal/Alertness: Awake/alert Overall Cognitive Status: Within Functional Limits for tasks assessed Orientation Level: Oriented X4 Attention: Sustained Sustained Attention: Appears intact Memory: Appears intact Awareness: Appears intact Problem Solving: Appears intact Safety/Judgment: Appears intact    Extremity Assessment (includes Sensation/Coordination)  Upper Extremity  Assessment: Defer to OT evaluation RUE Deficits / Details: Pt currently exhibits synergy pattern with shoulder and elbow movements at Brunnstrum stage IV.  Full AROM digit flexion and extension but pt only able to oppose thumb to the first and second digits only RUE Sensation: WNL RUE Coordination: decreased fine motor, decreased gross motor  Lower Extremity Assessment: RLE deficits/detail RLE Deficits / Details: pt grossly 3+/5 for hip flexion, knee flexion, knee extension and ankle DF; 4/5 for hip adduction and abduction    ADLs  Overall ADL's : Needs assistance/impaired Eating/Feeding: Supervision/ safety Grooming: Wash/dry hands, Minimal assistance, Standing Upper Body Bathing: Minimal assistance, Sitting Lower Body Bathing: Minimal assistance, Sit to/from stand Upper Body Dressing : Minimal assistance, Sitting Lower Body Dressing: Moderate assistance, Sit to/from stand Toilet Transfer: Moderate assistance, Ambulation Toileting- Clothing Manipulation and Hygiene: Moderate assistance, Sit to/from stand Functional mobility during ADLs: Moderate assistance General ADL Comments: Pt with more physical impairment noted than on 05/07/18 eval.  Pt now with only trace digit extension and  flexion where as on Friday he exhibited almost full gross AROM for flexion and extension.  He also needed mod assist for functional mobility hand held assist with noted right knee buckline this session, which was only min assist on Friday.  Educated pt and family on AAROM exercises for the RUE and hand as well as providing handout.  Pt is an excellent CIR level candidate at this time.     Mobility  Overal bed mobility: Needs Assistance Bed Mobility: Supine to Sit Supine to sit: Min assist Sit to supine: Min guard General bed mobility comments: patient requesting assist from family to perform supine to sit, to raise trunk and move right LE off bed.     Transfers  Overall transfer level: Needs  assistance Equipment used: 1 person hand held assist Transfers: Sit to/from Stand Sit to Stand: Min assist Stand pivot transfers: Mod assist General transfer comment: Pt with posterior LOB in standing as well as occasional right knee buckling with mobility.      Ambulation / Gait / Stairs / Wheelchair Mobility  Ambulation/Gait Ambulation/Gait assistance: Min assist, Mod assist Gait Distance (Feet): 200 Feet Assistive device: 1 person hand held assist Gait Pattern/deviations: Step-to pattern, Shuffle General Gait Details: min/mod hand held assist. Occasional right knee buckling with gait.   Gait velocity: decreased    Posture / Balance Balance Overall balance assessment: Needs assistance Sitting-balance support: Feet supported Sitting balance-Leahy Scale: Fair Standing balance support: Single extremity supported Standing balance-Leahy Scale: Fair Standing balance comment: Mod assist for dynamic standing balance during simulated selfcare tasks and transfers.     Special needs/care consideration BiPAP/CPAP No CPM No Continuous Drip IV No Dialysis No         Life Vest No Oxygen No Special Bed No Trach Size No Wound Vac (area) No   Skin:  No                              Bowel mgmt: Last BM 05/08/18 Bladder mgmt: Voiding in urinal and in bathroom with assist Diabetic mgmt Yes, on oral medication at home    Previous Home Environment Living Arrangements: Children, Spouse/significant other Available Help at Discharge: Family, Available 24 hours/day Type of Home: House Home Layout: One level Home Access: Stairs to enter Entrance Stairs-Rails: None Entrance Stairs-Number of Steps: 1 Bathroom Shower/Tub: Multimedia programmer: Standard Bathroom Accessibility: Yes How Accessible: Accessible via walker [place]: No  Discharge Living Setting Plans for Discharge Living Setting: Patient's home, House, Lives with (comment)(Lives with wife and 3 daughters.) Type  of Home at Discharge: House Discharge Home Layout: One level Discharge Home Access: Stairs to enter Entrance Stairs-Number of Steps: 1 step into house Discharge Bathroom Shower/Tub: Walk-in shower, Curtain Discharge Bathroom Toilet: Standard Discharge Bathroom Accessibility: Yes How Accessible: Accessible via walker Does the patient have any problems obtaining your medications?: No  Social/Family/Support Systems Patient Roles: Spouse, Parent(Has a wife and 7 daughters.) Contact Information: Colletta [place] - daughter - [phone] Anticipated Caregiver: Wife and daughters Ability/Limitations of Caregiver: Colletta [place] to provide care while mom is working.  Verdis Frederickson, wife, works 5 am to 130 pm. Caregiver Availability: 24/7 Discharge Plan Discussed with Primary Caregiver: Yes Is Caregiver In Agreement with Plan?: Yes Does Caregiver/Family have Issues with Lodging/Transportation while Pt is in Rehab?: No   Goals/Additional Needs Patient/Family Goal for Rehab: PT/OT mod I and supervision goals Expected length of stay: 7-10 days Cultural Considerations: Speaks Spanish, understands  minimal English, needs interpreter Dietary Needs: Heart Healthy, carb modified, thin liquids Equipment Needs: TBD Pt/Family Agrees to Admission and willing to participate: Yes Program Orientation Provided & Reviewed with Pt/Caregiver Including Roles  & Responsibilities: Yes  Decrease burden of Care through IP rehab admission: No  Possible need for SNF placement upon discharge: Not planned  Patient Condition: This patient's medical and functional status has changed since the consult dated: 05/07/18 in which the Rehabilitation Physician determined and documented that the patient's condition is appropriate for intensive rehabilitative care in an inpatient rehabilitation facility. See "History of Present Illness" (above) for medical update. Functional changes are:  Currently requiring min to mod assist to ambulate 200 feet +1  HHA. Patient's medical and functional status update has been discussed with the Rehabilitation physician and patient remains appropriate for inpatient rehabilitation. Will admit to inpatient rehab today.  Preadmission Screen Completed By:  [name], 05/11/2018 10:53 AM ______________________________________________________________________   Discussed status with Dr. Naaman Plummer on 05/11/18 at 1052  and received telephone approval for admission today.  Admission Coordinator:  [name], time 1052/Date 05/11/18

## 2018-05-11 ENCOUNTER — Other Ambulatory Visit: Payer: Self-pay

## 2018-05-11 ENCOUNTER — Inpatient Hospital Stay (HOSPITAL_COMMUNITY)
Admission: RE | Admit: 2018-05-11 | Discharge: 2018-05-21 | DRG: 057 | Disposition: A | Discharge status: Home / Self Care | Payer: No Typology Code available for payment source | Source: Intra-hospital | Attending: Physical Medicine & Rehabilitation | Admitting: Physical Medicine & Rehabilitation

## 2018-05-11 DIAGNOSIS — E1151 Type 2 diabetes mellitus with diabetic peripheral angiopathy without gangrene: Secondary | ICD-10-CM | POA: Diagnosis present

## 2018-05-11 DIAGNOSIS — I69351 Hemiplegia and hemiparesis following cerebral infarction affecting right dominant side: Secondary | ICD-10-CM | POA: Diagnosis present

## 2018-05-11 DIAGNOSIS — Z7984 Long term (current) use of oral hypoglycemic drugs: Secondary | ICD-10-CM

## 2018-05-11 DIAGNOSIS — I10 Essential (primary) hypertension: Secondary | ICD-10-CM | POA: Diagnosis present

## 2018-05-11 DIAGNOSIS — E119 Type 2 diabetes mellitus without complications: Secondary | ICD-10-CM

## 2018-05-11 DIAGNOSIS — Z833 Family history of diabetes mellitus: Secondary | ICD-10-CM

## 2018-05-11 DIAGNOSIS — I6930 Unspecified sequelae of cerebral infarction: Secondary | ICD-10-CM | POA: Diagnosis present

## 2018-05-11 DIAGNOSIS — F17200 Nicotine dependence, unspecified, uncomplicated: Secondary | ICD-10-CM | POA: Diagnosis present

## 2018-05-11 DIAGNOSIS — I69392 Facial weakness following cerebral infarction: Secondary | ICD-10-CM

## 2018-05-11 DIAGNOSIS — I639 Cerebral infarction, unspecified: Secondary | ICD-10-CM | POA: Diagnosis present

## 2018-05-11 DIAGNOSIS — I6350 Cerebral infarction due to unspecified occlusion or stenosis of unspecified cerebral artery: Secondary | ICD-10-CM | POA: Diagnosis present

## 2018-05-11 DIAGNOSIS — E1142 Type 2 diabetes mellitus with diabetic polyneuropathy: Secondary | ICD-10-CM | POA: Diagnosis present

## 2018-05-11 DIAGNOSIS — G8191 Hemiplegia, unspecified affecting right dominant side: Secondary | ICD-10-CM | POA: Diagnosis not present

## 2018-05-11 DIAGNOSIS — E785 Hyperlipidemia, unspecified: Secondary | ICD-10-CM | POA: Diagnosis present

## 2018-05-11 DIAGNOSIS — R110 Nausea: Secondary | ICD-10-CM | POA: Diagnosis not present

## 2018-05-11 LAB — CBC
HCT: 45.8 % (ref 39.0–52.0)
Hemoglobin: 16.5 g/dL (ref 13.0–17.0)
MCH: 31.4 pg (ref 26.0–34.0)
MCHC: 36 g/dL (ref 30.0–36.0)
MCV: 87.1 fL (ref 80.0–100.0)
Platelets: 339 10*3/uL (ref 150–400)
RBC: 5.26 MIL/uL (ref 4.22–5.81)
RDW: 12.1 % (ref 11.5–15.5)
WBC: 11.7 10*3/uL — ABNORMAL HIGH (ref 4.0–10.5)
nRBC: 0 % (ref 0.0–0.2)

## 2018-05-11 LAB — GLUCOSE, CAPILLARY
GLUCOSE-CAPILLARY: 122 mg/dL — AB (ref 70–99)
Glucose-Capillary: 111 mg/dL — ABNORMAL HIGH (ref 70–99)
Glucose-Capillary: 142 mg/dL — ABNORMAL HIGH (ref 70–99)
Glucose-Capillary: 162 mg/dL — ABNORMAL HIGH (ref 70–99)

## 2018-05-11 LAB — CREATININE, SERUM
Creatinine, Ser: 0.9 mg/dL (ref 0.61–1.24)
GFR calc Af Amer: >60 mL/min (ref >60)
GFR calc non Af Amer: >60 mL/min (ref >60)

## 2018-05-11 MED ORDER — SORBITOL 70 % SOLN: 30.0000 mL | Freq: Every day | Status: DC | PRN

## 2018-05-11 MED ORDER — ACETAMINOPHEN 160 MG/5ML PO SOLN: 650.0000 mg | ORAL | Status: DC | PRN

## 2018-05-11 MED ORDER — ASPIRIN EC 81 MG PO TBEC
81.0000 mg | DELAYED_RELEASE_TABLET | Freq: Every day | ORAL | Status: DC
  Administered 2018-05-12 – 2018-05-21 (×10): 81 mg via ORAL
  Filled 2018-05-11 (×10): qty 1

## 2018-05-11 MED ORDER — CLOPIDOGREL BISULFATE 75 MG PO TABS: 75.0000 mg | ORAL_TABLET | Freq: Every day | ORAL | Status: DC

## 2018-05-11 MED ORDER — SENNOSIDES-DOCUSATE SODIUM 8.6-50 MG PO TABS: 1.0000 | ORAL_TABLET | Freq: Every evening | ORAL | Status: DC | PRN

## 2018-05-11 MED ORDER — ACETAMINOPHEN 325 MG PO TABS
650.0000 mg | ORAL_TABLET | ORAL | Status: DC | PRN
  Administered 2018-05-12 – 2018-05-19 (×9): 650 mg via ORAL
  Filled 2018-05-11 (×13): qty 2

## 2018-05-11 MED ORDER — INSULIN ASPART 100 UNIT/ML ~~LOC~~ SOLN
0.0000 [IU] | Freq: Three times a day (TID) | SUBCUTANEOUS | Status: DC
  Administered 2018-05-12: 2 [IU] via SUBCUTANEOUS
  Administered 2018-05-12 – 2018-05-13 (×4): 3 [IU] via SUBCUTANEOUS
  Administered 2018-05-14: 2 [IU] via SUBCUTANEOUS
  Administered 2018-05-14: 3 [IU] via SUBCUTANEOUS
  Administered 2018-05-14: 5 [IU] via SUBCUTANEOUS
  Administered 2018-05-15: 8 [IU] via SUBCUTANEOUS
  Administered 2018-05-15: 2 [IU] via SUBCUTANEOUS
  Administered 2018-05-15: 8 [IU] via SUBCUTANEOUS
  Administered 2018-05-16: 3 [IU] via SUBCUTANEOUS
  Administered 2018-05-16: 5 [IU] via SUBCUTANEOUS
  Administered 2018-05-17: 3 [IU] via SUBCUTANEOUS
  Administered 2018-05-17: 5 [IU] via SUBCUTANEOUS
  Administered 2018-05-17: 2 [IU] via SUBCUTANEOUS
  Administered 2018-05-18: 8 [IU] via SUBCUTANEOUS
  Administered 2018-05-18: 2 [IU] via SUBCUTANEOUS
  Administered 2018-05-18: 3 [IU] via SUBCUTANEOUS
  Administered 2018-05-19: 2 [IU] via SUBCUTANEOUS
  Administered 2018-05-19: 3 [IU] via SUBCUTANEOUS
  Administered 2018-05-19: 5 [IU] via SUBCUTANEOUS
  Administered 2018-05-20 (×3): 3 [IU] via SUBCUTANEOUS
  Administered 2018-05-21: 2 [IU] via SUBCUTANEOUS

## 2018-05-11 MED ORDER — ENOXAPARIN SODIUM 40 MG/0.4ML ~~LOC~~ SOLN: 40.0000 mg | SUBCUTANEOUS | Status: DC

## 2018-05-11 MED ORDER — GLIPIZIDE 5 MG PO TABS
5.0000 mg | ORAL_TABLET | Freq: Two times a day (BID) | ORAL | Status: DC
  Administered 2018-05-11 – 2018-05-19 (×16): 5 mg via ORAL
  Filled 2018-05-11 (×17): qty 1

## 2018-05-11 MED ORDER — ACETAMINOPHEN 650 MG RE SUPP: 650.0000 mg | RECTAL | Status: DC | PRN

## 2018-05-11 MED ORDER — ATORVASTATIN CALCIUM 80 MG PO TABS
80.0000 mg | ORAL_TABLET | Freq: Every day | ORAL | Status: DC
  Administered 2018-05-12 – 2018-05-21 (×10): 80 mg via ORAL
  Filled 2018-05-11 (×10): qty 1

## 2018-05-11 MED ORDER — ENOXAPARIN SODIUM 40 MG/0.4ML ~~LOC~~ SOLN
40.0000 mg | SUBCUTANEOUS | Status: DC
  Administered 2018-05-12 – 2018-05-20 (×9): 40 mg via SUBCUTANEOUS
  Filled 2018-05-11 (×9): qty 0.4

## 2018-05-11 MED ORDER — CLOPIDOGREL BISULFATE 75 MG PO TABS
75.0000 mg | ORAL_TABLET | Freq: Every day | ORAL | Status: DC
  Administered 2018-05-12 – 2018-05-21 (×10): 75 mg via ORAL
  Filled 2018-05-11 (×10): qty 1

## 2018-05-11 MED ORDER — METFORMIN HCL 500 MG PO TABS
1000.0000 mg | ORAL_TABLET | Freq: Two times a day (BID) | ORAL | Status: DC
  Administered 2018-05-11 – 2018-05-12 (×2): 1000 mg via ORAL
  Filled 2018-05-11 (×2): qty 2

## 2018-05-11 NOTE — Progress Notes (Signed)
IP rehab admissions - I have approval for acute inpatient rehab admission for today.  Bed available and will admit today.  Call me for questions.  [phone]

## 2018-05-11 NOTE — Plan of Care (Signed)
Pt completed and met goals of care. Pt is transferred to 4N (CIR). Pt has no new concerns. Transfer report given to [facility].

## 2018-05-11 NOTE — Discharge Summary (Signed)
Physician Discharge Summary  Arthur Howard ATF:[id number] DOB: [birth year] DOA: 05/06/2018  PCP: Patient, No Pcp Per  Admit date: 05/06/2018 Discharge date: 05/11/2018  Admitted From: Home isposition:  CIR  Recommendations for Outpatient Follow-up:  1. Follow up with PCP in 1-2 weeks 2. Please obtain BMP/CBC in one week.  Home Health:No Equipment/Devices:None  Discharge Condition:stable CODE STATUS:Full Diet recommendation: Heart Healthy   Brief/Interim Summary: 53 y.o. male past medical history significant for diabetes, hyperlipidemia and weakness noticed right lower extremity and right upper extremity numbness and weakness.  He was getting up for work and he felt very weak and unable to brush his teeth.  He went to work and noticed his right arm and leg were still weak.  Discharge Diagnoses:  Principal Problem:   CVA (cerebral vascular accident) ([place]) Active Problems:   Hyperlipidemia   Diabetes mellitus without complication ([place])   Tobacco abuse   Diabetes mellitus type 2 in nonobese [facility])   Uncontrolled type 2 diabetes mellitus with hyperglycemia ([place]) CVA: HgbA1c 11.3, fasting lipid panel started on statin 80 mg. 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 PT, OT, recommended CIR. CIR physician is been consulted awaiting insurance approval. Echocardiogram normal EF. CT Angie of the head and neck normal. Prophylactic therapy-Antiplatelet med: Dual platelet anti-therapy with aspirin 81 mg and Plavix for 3 weeks then aspirin alone. Physical therapy was consulted recommended CIR.  Elevated blood pressure without a diagnosis of hypertension: His blood pressure came down independently of treatment. We will follow-up with PCP as an outpatient.  Hyperlipidemia: Excellent continue Lipitor.  Diabetes mellitus type 2: [place] due to noncompliance his A1c was 13.3. He was started on insulins in the hospital. After the third day he was  placed on metformin and glipizide and blood glucose remained controlled garments. He will follow-up with PCP as an outpatient titrate hypoglycemic agents as needed.   Discharge Instructions  Discharge Instructions    Ambulatory referral to Neurology   Complete by:  As directed    Follow up with stroke clinic NP (Jessica Vanschaick or Cecille Rubin, if both not available, consider Dr. Antony Contras, Dr. Bess Harvest, or Dr. Sarina Ill) at [facility] in about 4 weeks.  For discharge to inpatient rehab, likely next week   Diet - low sodium heart healthy   Complete by:  As directed    Increase activity slowly   Complete by:  As directed      Allergies as of 05/11/2018   No Known Allergies     Medication List    TAKE these medications   acetaminophen 500 MG tablet Commonly known as:  TYLENOL Take 500 mg by mouth every 6 (six) hours as needed for mild pain.   atorvastatin 40 MG tablet Commonly known as:  LIPITOR Take 1 tablet (40 mg total) by mouth daily.   blood glucose meter kit and supplies Kit Dispense based on patient and insurance preference. Use up to four times daily as directed. (FOR ICD-9 250.00, 250.01).   clopidogrel 75 MG tablet Commonly known as:  PLAVIX Take 1 tablet (75 mg total) by mouth daily.   glipiZIDE 5 MG tablet Commonly known as:  GLUCOTROL Take 1 tablet (5 mg total) by mouth 2 (two) times daily before a meal.   metFORMIN 1000 MG tablet Commonly known as:  GLUCOPHAGE Take 1 tablet (1,000 mg total) by mouth 2 (two) times daily with a meal.      Follow-up Information  [facility] Follow up in 4 week(s).   Specialty:  Neurology Why:  Stroke clinic.  Office will call with appointment date and time. Contact information: [address] [place] [phone]         No Known Allergies  Consultations:  Neurology   Procedures/Studies: Ct Angio Head W Or Wo  Contrast  Result Date: 05/06/2018 CLINICAL DATA:  Right-sided weakness and speech disturbance beginning 1830 hours yesterday. EXAM: CT ANGIOGRAPHY HEAD AND NECK TECHNIQUE: Multidetector CT imaging of the head and neck was performed using the standard protocol during bolus administration of intravenous contrast. Multiplanar CT image reconstructions and MIPs were obtained to evaluate the vascular anatomy. Carotid stenosis measurements (when applicable) are obtained utilizing NASCET criteria, using the distal internal carotid diameter as the denominator. CONTRAST:  146m ISOVUE-370 IOPAMIDOL (ISOVUE-370) INJECTION 76% COMPARISON:  Head CT same day FINDINGS: CTA NECK FINDINGS Aortic arch: Very minimal atherosclerotic calcification. No meaningful atherosclerosis. No aneurysm or dissection. Branching pattern is normal. Right carotid system: Common carotid artery widely patent to the bifurcation. The carotid bifurcation is normal without atherosclerotic disease. Cervical ICA is widely patent to the skull base. Left carotid system: Common carotid artery widely patent to the bifurcation. The carotid bifurcation is normal without atherosclerotic disease. Cervical ICA is normal. Vertebral arteries: Both vertebral artery origins are widely patent. Both vertebral arteries are widely patent through the cervical region to the foramen magnum. Skeleton: Minimal mid cervical spondylosis. Other neck: No mass or lymphadenopathy. Upper chest: Normal Review of the MIP images confirms the above findings CTA HEAD FINDINGS Anterior circulation: Both internal carotid arteries are widely patent through the skull base and siphon regions. The anterior and middle cerebral vessels are patent without proximal stenosis, aneurysm or vascular malformation. Posterior circulation: Both vertebral arteries are widely patent through the foramen magnum to the basilar. No basilar stenosis. Posterior circulation branch vessels are normal. Venous sinuses:  Patent and normal. Anatomic variants: None Delayed phase: No abnormal enhancement. Review of the MIP images confirms the above findings IMPRESSION: Normal examination.  No brachiocephalic vascular disease. Electronically Signed   By: MNelson ChimesM.D.   On: 05/06/2018 11:28   Ct Head Wo Contrast  Result Date: 05/06/2018 CLINICAL DATA:  Right-sided weakness and dysarthria EXAM: CT HEAD WITHOUT CONTRAST TECHNIQUE: Contiguous axial images were obtained from the base of the skull through the vertex without intravenous contrast. COMPARISON:  None. FINDINGS: Brain: Ventricles are normal in size and configuration. There is no intracranial mass, hemorrhage, extra-axial fluid collection, or midline shift. There is focal decreased attenuation along a portion of the genu and posterior limb of the right internal capsule consistent with age uncertain and possible recent small infarct in this area. Elsewhere brain parenchyma appears unremarkable. Vascular: No hyperdense vessel. There is calcification in each carotid siphon region. Skull: Bony calvarium appears intact. Sinuses/Orbits: There is mucosal thickening involving multiple ethmoid air cells bilaterally. There is a small retention cyst in the superomedial right maxillary antrum. Orbits appear symmetric bilaterally. Other: Mastoid air cells are clear. IMPRESSION: Small age uncertain infarct involving a portion of the genu and posterior limb of the right internal capsule. Brain parenchyma elsewhere appears unremarkable. No evident mass or hemorrhage. There are foci of arterial vascular calcification. There are foci of paranasal sinus disease. Electronically Signed   By: WLowella GripIII M.D.   On: 05/06/2018 08:33   Ct Angio Neck W Or Wo Contrast  Result Date: 05/06/2018 CLINICAL DATA:  Right-sided weakness  and speech disturbance beginning 1830 hours yesterday. EXAM: CT ANGIOGRAPHY HEAD AND NECK TECHNIQUE: Multidetector CT imaging of the head and neck was  performed using the standard protocol during bolus administration of intravenous contrast. Multiplanar CT image reconstructions and MIPs were obtained to evaluate the vascular anatomy. Carotid stenosis measurements (when applicable) are obtained utilizing NASCET criteria, using the distal internal carotid diameter as the denominator. CONTRAST:  120m ISOVUE-370 IOPAMIDOL (ISOVUE-370) INJECTION 76% COMPARISON:  Head CT same day FINDINGS: CTA NECK FINDINGS Aortic arch: Very minimal atherosclerotic calcification. No meaningful atherosclerosis. No aneurysm or dissection. Branching pattern is normal. Right carotid system: Common carotid artery widely patent to the bifurcation. The carotid bifurcation is normal without atherosclerotic disease. Cervical ICA is widely patent to the skull base. Left carotid system: Common carotid artery widely patent to the bifurcation. The carotid bifurcation is normal without atherosclerotic disease. Cervical ICA is normal. Vertebral arteries: Both vertebral artery origins are widely patent. Both vertebral arteries are widely patent through the cervical region to the foramen magnum. Skeleton: Minimal mid cervical spondylosis. Other neck: No mass or lymphadenopathy. Upper chest: Normal Review of the MIP images confirms the above findings CTA HEAD FINDINGS Anterior circulation: Both internal carotid arteries are widely patent through the skull base and siphon regions. The anterior and middle cerebral vessels are patent without proximal stenosis, aneurysm or vascular malformation. Posterior circulation: Both vertebral arteries are widely patent through the foramen magnum to the basilar. No basilar stenosis. Posterior circulation branch vessels are normal. Venous sinuses: Patent and normal. Anatomic variants: None Delayed phase: No abnormal enhancement. Review of the MIP images confirms the above findings IMPRESSION: Normal examination.  No brachiocephalic vascular disease. Electronically  Signed   By: MNelson ChimesM.D.   On: 05/06/2018 11:28   Mr Brain Wo Contrast  Result Date: 05/06/2018 CLINICAL DATA:  Right arm and leg weakness and balance disturbance. Negative CT evaluation. EXAM: MRI HEAD WITHOUT CONTRAST TECHNIQUE: Multiplanar, multiecho pulse sequences of the brain and surrounding structures were obtained without intravenous contrast. COMPARISON:  CT study same day FINDINGS: Brain: Diffusion imaging shows acute infarction in the left ventral inferior pons. No other acute finding. Elsewhere, the brainstem and cerebellum are normal. There is an old small vessel infarction in the right basal ganglia/radiating white matter tracts. There is minimal small vessel ischemic change of the hemispheric white matter. No large vessel territory infarction. No mass lesion, hemorrhage, hydrocephalus or extra-axial collection. Vascular: Major vessels at the base of the brain show flow. Skull and upper cervical spine: Negative Sinuses/Orbits: Clear/normal Other: None IMPRESSION: Small acute infarction of the left inferior ventral pons. No sign of hemorrhage or mass effect. Minimal chronic small-vessel change of the hemispheric white matter elsewhere as described above. Small old lacunar infarction right basal ganglia/radiating white matter tracts. Electronically Signed   By: MNelson ChimesM.D.   On: 05/06/2018 12:51  Subjective: No complains  Discharge Exam: Vitals:   05/11/18 0344 05/11/18 0732  BP: 123/71 117/72  Pulse: (!) 52 (!) 59  Resp: 15 18  Temp: 97.8 F (36.6 C) 97.9 F (36.6 C)  SpO2: 97% 96%     General: Pt is alert, awake, not in acute distress Cardiovascular: RRR, S1/S2 +, no rubs, no gallops Respiratory: CTA bilaterally, no wheezing, no rhonchi Abdominal: Soft, NT, ND, bowel sounds + Extremities: no edema, no cyanosis    The results of significant diagnostics from this hospitalization (including imaging, microbiology, ancillary and laboratory) are listed below for  reference.  Microbiology: No results found for this or any previous visit (from the past 240 hour(s)).   Labs: BNP (last 3 results) No results for input(s): BNP in the last 8760 hours. Basic Metabolic Panel: Recent Labs  Lab 05/06/18 0741  NA 135  K 4.2  CL 99  CO2 25  GLUCOSE 297*  BUN 15  CREATININE 0.80  CALCIUM 9.3   Liver Function Tests: Recent Labs  Lab 05/06/18 0741  AST 20  ALT 23  ALKPHOS 57  BILITOT 0.9  PROT 6.5  ALBUMIN 3.6   No results for input(s): LIPASE, AMYLASE in the last 168 hours. No results for input(s): AMMONIA in the last 168 hours. CBC: Recent Labs  Lab 05/06/18 0741  WBC 8.1  NEUTROABS 3.5  HGB 15.6  HCT 45.6  MCV 89.2  PLT 334   Cardiac Enzymes: No results for input(s): CKTOTAL, CKMB, CKMBINDEX, TROPONINI in the last 168 hours. BNP: Invalid input(s): POCBNP CBG: Recent Labs  Lab 05/10/18 0603 05/10/18 1122 05/10/18 1648 05/10/18 2105 05/11/18 0647  GLUCAP 93 209* 106* 111* 111*   D-Dimer No results for input(s): DDIMER in the last 72 hours. Hgb A1c No results for input(s): HGBA1C in the last 72 hours. Lipid Profile No results for input(s): CHOL, HDL, LDLCALC, TRIG, CHOLHDL, LDLDIRECT in the last 72 hours. Thyroid function studies No results for input(s): TSH, T4TOTAL, T3FREE, THYROIDAB in the last 72 hours.  Invalid input(s): FREET3 Anemia work up No results for input(s): VITAMINB12, FOLATE, FERRITIN, TIBC, IRON, RETICCTPCT in the last 72 hours. Urinalysis    Component Value Date/Time   BILIRUBINUR negative 05/30/2017 1155   KETONESUR negative 05/30/2017 1155   PROTEINUR negative 05/30/2017 1155   UROBILINOGEN 0.2 05/30/2017 1155   NITRITE Negative 05/30/2017 1155   LEUKOCYTESUR Negative 05/30/2017 1155   Sepsis Labs Invalid input(s): PROCALCITONIN,  WBC,  LACTICIDVEN Microbiology No results found for this or any previous visit (from the past 240 hour(s)).   Time coordinating discharge: 40  minutes  SIGNED:   Charlynne Cousins, MD  [facility]

## 2018-05-11 NOTE — Care Management Note (Signed)
Case Management Note  Patient Details  Name: Arthur Howard MRN: [id number] Date of Birth: 10-03-1965  Subjective/Objective:                    Action/Plan: Pt discharging to CIR today. CM signing off.   Expected Discharge Date:  05/11/18               Expected Discharge Plan:  [place]  In-House Referral:     Discharge planning Services  CM Consult(PCP)  Post Acute Care Choice:    Choice offered to:     DME Arranged:    DME Agency:     HH Arranged:    HH Agency:     Status of Service:  Completed, signed off  If discussed at [employer], dates discussed:    Additional Comments:  Pollie Friar, RN 05/11/2018, 12:32 PM

## 2018-05-11 NOTE — Progress Notes (Signed)
Pt admitted to room [phone]. Family at bedside. Pt oriented to floor, call bell and fall precautions. Pt denies any pain or discomfort at this time. Vitals stable. Continue plan of care.   Arthur Howard W Dolce [name]

## 2018-05-11 NOTE — Progress Notes (Signed)
Arthur Arn, MD  Physician  Physical Medicine and Rehabilitation  Consult Note  Signed  Date of Service:  05/07/2018 11:24 AM       Related encounter: ED to Hosp-Admission (Current) from 05/06/2018 in [place] [facility]      Signed      Expand All Collapse All    Show:Clear all '[x]' Manual'[x]' Template'[x]' Copied  Added by: '[x]' Angiulli, Lavon Paganini, PA-C'[x]' Arthur Arn, MD  '[]' Hover for details      Physical Medicine and Rehabilitation Consult Reason for Consult: Stroke Referring Physician: Charlynne Cousins, MD   HPI: Arthur Howard is a 53 y.o.right handed non-English-speaking male with history of tobacco abuse,diabetes mellitus hyperlipidemia. Per chart review and daughter, patient lives with wife and daughter. 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 Cranial CT scan showed small age uncertain infarct involving a portion of the genu and posterior limb of the right internal capsule. Urine drug screen pending. CT angiogram of head and neck negative. Patient did not receive TPA.  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 No signs of hemorrhage. Echocardiogram completed, read pending. Presently maintained on aspirin and Plavix for CVA prophylaxis. Subcutaneous Lovenox for DVT prophylaxis. Tolerating a regular consistency diet.  Therapy is recommending CIR.  Review of Systems  Unable to perform ROS: Language       Past Medical History:  Diagnosis Date  . Diabetes mellitus without complication (Alpha)   . Hyperlipidemia         Past Surgical History:  Procedure Laterality Date  . ANKLE SURGERY          Family History  Problem Relation Age of Onset  . Diabetes Sister   . Diabetes Brother   . CVA Neg Hx    Social History:  reports that he has been smoking. He has never used smokeless tobacco. He  reports that he does not drink alcohol or use drugs. Allergies: No Known Allergies       Medications Prior to Admission  Medication Sig Dispense Refill  . acetaminophen (TYLENOL) 500 MG tablet Take 500 mg by mouth every 6 (six) hours as needed for mild pain.    Marland Kitchen atorvastatin (LIPITOR) 40 MG tablet Take 1 tablet (40 mg total) by mouth daily. (Patient not taking: Reported on 05/06/2018) 90 tablet 1  . blood glucose meter kit and supplies KIT Dispense based on patient and insurance preference. Use up to four times daily as directed. (FOR ICD-9 250.00, 250.01). (Patient not taking: Reported on 05/30/2017) 1 each 0  . glipiZIDE (GLUCOTROL) 5 MG tablet Take 1 tablet (5 mg total) by mouth 2 (two) times daily before a meal. (Patient not taking: Reported on 05/06/2018) 90 tablet 1  . metFORMIN (GLUCOPHAGE) 1000 MG tablet Take 1 tablet (1,000 mg total) by mouth 2 (two) times daily with a meal. 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 rate 16, SpO2 98 %. Physical Exam  Vitals reviewed. Constitutional: He appears well-developed and well-nourished.  HENT:  Head: Normocephalic and atraumatic.  Eyes: EOM are normal. Right eye exhibits no discharge. Left eye exhibits no discharge.  Neck: Normal range of motion. Neck supple.  Cardiovascular: Normal rate and regular rhythm.  Respiratory: Effort normal and breath sounds normal.  GI: Soft. Bowel sounds are normal.  Musculoskeletal:     Comments: No edema or tenderness in extremities  Neurological: He is alert.  Multiple family members at bedside  Follow simple demonstrated commands.  Motor: LUE/LLE: 5/5 proximal distal RUE: 2+/5 proximal to distal with apraxia RLE: 4-/5 proximal to distal  Skin: Skin is warm and dry.  Psychiatric:  Mood, affect, behavior appear to be normal    LabResultsLast24Hours       Results for orders placed or performed during the hospital encounter of 05/06/18 (from the past 24 hour(s))  HIV antibody (Routine Testing)     Status: None   Collection Time: 05/06/18  1:08 PM  Result Value Ref Range   HIV Screen 4th Generation wRfx Non Reactive Non Reactive  CBG monitoring, ED     Status: Abnormal   Collection Time: 05/06/18  1:17 PM  Result Value Ref Range   Glucose-Capillary 215 (H) 70 - 99 mg/dL  Glucose, capillary     Status: Abnormal   Collection Time: 05/06/18  5:18 PM  Result Value Ref Range   Glucose-Capillary 241 (H) 70 - 99 mg/dL   Comment 1 Notify RN    Comment 2 Document in Chart   TSH     Status: None   Collection Time: 05/06/18  7:45 PM  Result Value Ref Range   TSH 1.891 0.350 - 4.500 uIU/mL  Glucose, capillary     Status: Abnormal   Collection Time: 05/06/18  9:07 PM  Result Value Ref Range   Glucose-Capillary 145 (H) 70 - 99 mg/dL   Comment 1 Notify RN    Comment 2  Document in Chart   Lipid panel     Status: Abnormal   Collection Time: 05/07/18  6:16 AM  Result Value Ref Range   Cholesterol 211 (H) 0 - 200 mg/dL   Triglycerides 220 (H) <150 mg/dL   HDL 34 (L) >40 mg/dL   Total CHOL/HDL Ratio 6.2 RATIO   VLDL 44 (H) 0 - 40 mg/dL   LDL Cholesterol 133 (H) 0 - 99 mg/dL  Glucose, capillary     Status: Abnormal   Collection Time: 05/07/18  6:29 AM  Result Value Ref Range   Glucose-Capillary 182 (H) 70 - 99 mg/dL   Comment 1 Notify RN    Comment 2 Document in Chart       ImagingResults(Last48hours)  Ct Angio Head W Or Wo Contrast  Result Date: 05/06/2018 CLINICAL DATA:  Right-sided weakness and speech disturbance beginning 1830 hours yesterday. EXAM: CT ANGIOGRAPHY HEAD AND NECK TECHNIQUE: Multidetector CT imaging of the head and neck was performed using the standard protocol during bolus administration of intravenous contrast. Multiplanar CT image reconstructions and MIPs were obtained to evaluate the vascular anatomy. Carotid stenosis measurements (when applicable) are obtained utilizing NASCET criteria,  using the distal internal carotid diameter as the denominator. CONTRAST:  146m ISOVUE-370 IOPAMIDOL (ISOVUE-370) INJECTION 76% COMPARISON:  Head CT same day FINDINGS: CTA NECK FINDINGS Aortic arch: Very minimal atherosclerotic calcification. No meaningful atherosclerosis. No aneurysm or dissection. Branching pattern is normal. Right carotid system: Common carotid artery widely patent to the bifurcation. The carotid bifurcation is normal without atherosclerotic disease. Cervical ICA is widely patent to the skull base. Left carotid system: Common carotid artery widely patent to the bifurcation. The carotid bifurcation is normal without atherosclerotic disease. Cervical ICA is normal. Vertebral arteries: Both vertebral artery origins are widely patent. Both vertebral arteries are widely patent through the cervical region to the foramen  magnum. Skeleton: Minimal mid cervical spondylosis. Other neck: No mass or lymphadenopathy. Upper chest: Normal Review of the MIP images confirms the above findings CTA HEAD FINDINGS Anterior circulation: Both internal carotid arteries are widely patent through the skull base and siphon regions. The anterior and middle cerebral vessels are patent without proximal stenosis, aneurysm or vascular malformation. Posterior circulation: Both vertebral arteries are widely patent through the foramen magnum to the basilar. No basilar stenosis. Posterior circulation branch vessels are normal. Venous sinuses: Patent and normal. Anatomic variants: None Delayed phase: No abnormal enhancement. Review of the MIP images confirms the above findings IMPRESSION: Normal examination.  No brachiocephalic vascular disease. Electronically Signed   By: MNelson ChimesM.D.   On: 05/06/2018 11:28   Ct Head Wo Contrast  Result Date: 05/06/2018 CLINICAL DATA:  Right-sided weakness and dysarthria EXAM: CT HEAD WITHOUT CONTRAST TECHNIQUE: Contiguous axial images were obtained from the base of the skull through the vertex without intravenous contrast. COMPARISON:  None. FINDINGS: Brain: Ventricles are normal in size and configuration. There is no intracranial mass, hemorrhage, extra-axial fluid collection, or midline shift. There is focal decreased attenuation along a portion of the genu and posterior limb of the right internal capsule consistent with age uncertain and possible recent small infarct in this area. Elsewhere brain parenchyma appears unremarkable. Vascular: No hyperdense vessel. There is calcification in each carotid siphon region. Skull: Bony calvarium appears intact. Sinuses/Orbits: There is mucosal thickening involving multiple ethmoid air cells bilaterally. There is a small retention cyst in the superomedial right maxillary antrum. Orbits appear symmetric bilaterally. Other: Mastoid air cells are clear. IMPRESSION: Small age  uncertain infarct involving a portion of the genu and posterior limb of the right internal capsule. Brain parenchyma elsewhere appears unremarkable. No evident mass or hemorrhage. There are foci of arterial vascular calcification. There are foci of paranasal sinus disease. Electronically Signed   By: WLowella GripIII M.D.   On: 05/06/2018 08:33   Ct Angio Neck W Or Wo Contrast  Result Date: 05/06/2018 CLINICAL DATA:  Right-sided weakness and speech disturbance beginning 1830 hours yesterday. EXAM: CT ANGIOGRAPHY HEAD AND NECK TECHNIQUE: Multidetector CT imaging of the head and neck was performed using the standard protocol during bolus administration of intravenous contrast. Multiplanar CT image reconstructions and MIPs were obtained to evaluate the vascular anatomy. Carotid stenosis measurements (when applicable) are obtained utilizing NASCET criteria, using the distal internal carotid diameter as the denominator. CONTRAST:  1051mISOVUE-370 IOPAMIDOL (ISOVUE-370) INJECTION 76% COMPARISON:  Head CT same day FINDINGS: CTA NECK FINDINGS Aortic arch: Very minimal atherosclerotic calcification. No meaningful atherosclerosis. No aneurysm or dissection. Branching pattern is normal. Right carotid system: Common carotid artery widely patent to the bifurcation. The carotid bifurcation is normal without atherosclerotic disease. Cervical ICA is widely patent to the skull base.  Left carotid system: Common carotid artery widely patent to the bifurcation. The carotid bifurcation is normal without atherosclerotic disease. Cervical ICA is normal. Vertebral arteries: Both vertebral artery origins are widely patent. Both vertebral arteries are widely patent through the cervical region to the foramen magnum. Skeleton: Minimal mid cervical spondylosis. Other neck: No mass or lymphadenopathy. Upper chest: Normal Review of the MIP images confirms the above findings CTA HEAD FINDINGS Anterior circulation: Both internal carotid  arteries are widely patent through the skull base and siphon regions. The anterior and middle cerebral vessels are patent without proximal stenosis, aneurysm or vascular malformation. Posterior circulation: Both vertebral arteries are widely patent through the foramen magnum to the basilar. No basilar stenosis. Posterior circulation branch vessels are normal. Venous sinuses: Patent and normal. Anatomic variants: None Delayed phase: No abnormal enhancement. Review of the MIP images confirms the above findings IMPRESSION: Normal examination.  No brachiocephalic vascular disease. Electronically Signed   By: Nelson Chimes M.D.   On: 05/06/2018 11:28   Mr Brain Wo Contrast  Result Date: 05/06/2018 CLINICAL DATA:  Right arm and leg weakness and balance disturbance. Negative CT evaluation. EXAM: MRI HEAD WITHOUT CONTRAST TECHNIQUE: Multiplanar, multiecho pulse sequences of the brain and surrounding structures were obtained without intravenous contrast. COMPARISON:  CT study same day FINDINGS: Brain: Diffusion imaging shows acute infarction in the left ventral inferior pons. No other acute finding. Elsewhere, the brainstem and cerebellum are normal. There is an old small vessel infarction in the right basal ganglia/radiating white matter tracts. There is minimal small vessel ischemic change of the hemispheric white matter. No large vessel territory infarction. No mass lesion, hemorrhage, hydrocephalus or extra-axial collection. Vascular: Major vessels at the base of the brain show flow. Skull and upper cervical spine: Negative Sinuses/Orbits: Clear/normal Other: None IMPRESSION: Small acute infarction of the left inferior ventral pons. No sign of hemorrhage or mass effect. Minimal chronic small-vessel change of the hemispheric white matter elsewhere as described above. Small old lacunar infarction right basal ganglia/radiating white matter tracts. Electronically Signed   By: Nelson Chimes M.D.   On: 05/06/2018 12:51       Assessment/Plan: Diagnosis: acute infarct left inferior ventral pons Labs and images (see above) independently reviewed.  Records reviewed and summated above. Stroke: Continue secondary stroke prophylaxis and Risk Factor Modification listed below:   Antiplatelet therapy:   Blood Pressure Management:  Continue current medication with prn's with permisive HTN per primary team Statin Agent:   Diabetes management:   Tobacco abuse:   Right sided hemiparesis: fit for orthosis to prevent contractures (resting hand splint for day, wrist cock up splint at night) Motor recovery: Fluoxetine  1. Does the need for close, 24 hr/day medical supervision in concert with the patient's rehab needs make it unreasonable for this patient to be served in a less intensive setting? Yes  2. Co-Morbidities requiring supervision/potential complications:  tobacco abuse (counsel), uncontrolled diabetes mellitus (Monitor in accordance with exercise and adjust meds as necessary), hyperlipidemia 3. Due to safety, disease management and patient education, does the patient require 24 hr/day rehab nursing? Yes 4. Does the patient require coordinated care of a physician, rehab nurse, PT (1-2 hrs/day, 5 days/week) and OT (1-2 hrs/day, 5 days/week) to address physical and functional deficits in the context of the above medical diagnosis(es)? Yes Addressing deficits in the following areas: balance, endurance, locomotion, strength, transferring, bathing, dressing, toileting and psychosocial support 5. Can the patient actively participate in an intensive therapy program of at least  3 hrs of therapy per day at least 5 days per week? Yes 6. The potential for patient to make measurable gains while on inpatient rehab is excellent 7. Anticipated functional outcomes upon discharge from inpatient rehab are modified independent and supervision  with PT, modified independent and supervision with OT, n/a with SLP. 8. Estimated rehab  length of stay to reach the above functional goals is: 7-10 days. 9. Anticipated D/C setting: Home 10. Anticipated post D/C treatments: HH therapy and Home excercise program 11. Overall Rehab/Functional Prognosis: good  RECOMMENDATIONS: This patient's condition is appropriate for continued rehabilitative care in the following setting: CIR Patient has agreed to participate in recommended program. Yes Note that insurance prior authorization may be required for reimbursement for recommended care.  Comment: Rehab Admissions Coordinator to follow up.   I have personally performed a face to face diagnostic evaluation, including, but not limited to relevant history and physical exam findings, of this patient and developed relevant assessment and plan.  Additionally, I have reviewed and concur with the physician assistant's documentation above.   Delice Lesch, MD, ABPMR Lavon Paganini Angiulli, PA-C 05/07/2018        Revision History                   Routing History

## 2018-05-11 NOTE — Progress Notes (Signed)
  Speech Language Pathology Treatment: Cognitive-Linquistic  Patient Details Name: Arthur Howard MRN: [id number] DOB: September 11, 1965 Today's Date: 05/11/2018 Time: 1005-1020 SLP Time Calculation (min) (ACUTE ONLY): 15 min  Assessment / Plan / Recommendation Clinical Impression  Skilled treatment session focused on communication goals. SLP received pt in bed with his wife and daughter present in room. Daughter interpreted during session. Pt has made great progress with improvement of dysarthria as well as word finding. Pt is > 90% intelligible at the conversation level. He effectively using language to communicate with no breakdowns. Pt and family had appropriate questions regarding CIR, prognosis for return to work etc. All questions answered to this writer's ability. CSW for CIR made aware of questions and will visit pt. At this point, pt doesn't demonstrate any ST deficits. ST to sign off. Don't anticipate that pt will require any follow up services at CIR. Pt wishes to rehab his RUE.    HPI HPI: Cutler Sunday is a 53 y.o. spanish-speaking male with hyperlipidemia and diabetes. He was admitted to [facility]/06/18 with right side weakness and difficulty with balance and slurred speech (per family report). MRI revealed small acute infarction of the left inferior ventral pons; minimal chronic small-vessel change of the hemispheric white matter; small old lacunar infarction right      SLP Plan  Discharge SLP treatment due to (comment);All goals met                      General recommendations: Rehab consult Oral Care Recommendations: Oral care BID Follow up Recommendations: None SLP Visit Diagnosis: Aphasia (R47.01);Dysarthria and anarthria (R47.1) Plan: Discharge SLP treatment due to (comment);All goals met       GO                Alan Drummer 05/11/2018, 11:19 AM

## 2018-05-11 NOTE — H&P (Signed)
Physical Medicine and Rehabilitation Admission H&P        Chief Complaint  Patient presents with  . Weakness  : HPI: Arthur Howard is a 53 year old right handed non-English-speaking male with history of tobacco abuse, diabetes mellitus, hyperlipidemia. Per chart review and daughter, patient lives with wife and daughter. 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 Presented to 04/26/2018 with right sided weakness and facial droop as well as some slurred speech. Cranial CT scan showed small age uncertain infarct involving a portion of the genuine and posterior limb of right internal capsule. Urine drug screen negative. CT angiogram of head and neck negative. Patient did not receive TPA. 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 No signs of hemorrhage. Echocardiogram with ejection fraction of 65%. Presently maintained on aspirin and Plavix for CVA prophylaxis 3 weeks then aspirin alone. Subcutaneous Lovenox for DVT prophylaxis. Tolerating a regular diet. Therapy evaluations completed with recommendations of physical medicine rehabilitation consult. Patient was admitted for a comprehensive rehabilitation program.   Review of Systems  Unable to perform ROS: Language  Constitutional: Negative for fever.  HENT: Negative for hearing loss.   Eyes: Negative for blurred vision.  Respiratory: Negative for cough.   Cardiovascular: Negative for chest pain.  Gastrointestinal: Positive for nausea.  Genitourinary: Negative for dysuria.  Musculoskeletal: Negative for myalgias.  Skin: Negative for itching.  Neurological: Positive for dizziness.  Psychiatric/Behavioral: Negative for depression.        Past Medical History:  Diagnosis Date  . Diabetes mellitus without complication ([place])    . Hyperlipidemia           Past Surgical History:  Procedure Laterality Date  . ANKLE SURGERY             Family History  Problem Relation Age of Onset  .  Diabetes Sister    . Diabetes Brother    . CVA Neg Hx      Social History:  reports that he has been smoking. He has never used smokeless tobacco. He reports that he does not drink alcohol or use drugs. Allergies: No Known Allergies       Medications Prior to Admission  Medication Sig Dispense Refill  . acetaminophen (TYLENOL) 500 MG tablet Take 500 mg by mouth every 6 (six) hours as needed for mild pain.      Marland Kitchen atorvastatin (LIPITOR) 40 MG tablet Take 1 tablet (40 mg total) by mouth daily. (Patient not taking: Reported on 05/06/2018) 90 tablet 1  . blood glucose meter kit and supplies KIT Dispense based on patient and insurance preference. Use up to four times daily as directed. (FOR ICD-9 250.00, 250.01). (Patient not taking: Reported on 05/30/2017) 1 each 0  . glipiZIDE (GLUCOTROL) 5 MG tablet Take 1 tablet (5 mg total) by mouth 2 (two) times daily before a meal. (Patient not taking: Reported on 05/06/2018) 90 tablet 1  . metFORMIN (GLUCOPHAGE) 1000 MG tablet Take 1 tablet (1,000 mg total) by mouth 2 (two) times daily with a meal. (Patient not taking: Reported on 05/06/2018) 180 tablet 1      Drug Regimen Review Drug regimen was reviewed and remains appropriate with no significant issues identified   Home: Home Living Family/patient expects to be discharged to:: Private residence Living Arrangements: Children, Spouse/significant other Available Help at Discharge: Family, Available 24 hours/day Type of Home: House Home Access: Stairs to enter [employer] of Steps: 1 Entrance Stairs-Rails: None Home Layout: One  level Bathroom Shower/Tub: Engineer, mining: Yes Home Equipment: None   Functional History: Prior Function Level of Independence: Independent 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  Transfers Overall transfer level: Needs assistance Equipment used: 1 person hand held assist Transfers: Sit to/from Stand Sit to Stand: Min assist Stand pivot transfers: Mod assist General transfer comment: Pt with posterior LOB in standing as well as occasional right knee buckling with mobility.   Ambulation/Gait Ambulation/Gait assistance: Min assist, Mod assist Gait Distance (Feet): 200 Feet Assistive device: 1 person hand held assist Gait Pattern/deviations: Step-to pattern, Shuffle General Gait Details: min/mod hand held assist. Occasional right knee buckling with gait.   Gait velocity: decreased   ADL: ADL Overall ADL's : Needs assistance/impaired Eating/Feeding: Supervision/ safety Grooming: Wash/dry hands, Minimal assistance, Standing Upper Body Bathing: Minimal assistance, Sitting Lower Body Bathing: Minimal assistance, Sit to/from stand Upper Body Dressing : Minimal assistance, Sitting Lower Body Dressing: Moderate assistance, Sit to/from stand Toilet Transfer: Moderate assistance, Ambulation Toileting- Clothing Manipulation and Hygiene: Moderate assistance, Sit to/from stand Functional mobility during ADLs: Moderate assistance General ADL Comments: Pt with more physical impairment noted than on 05/07/18 eval.  Pt now with only trace digit extension and flexion where as on Friday he exhibited almost full gross AROM for flexion and extension.  He also needed mod assist for functional mobility hand held assist with noted right knee buckline this session, which was only min assist on Friday.  Educated pt and family on AAROM exercises for the RUE and hand as well as providing handout.  Pt is an excellent CIR level candidate at this time.    Cognition: Cognition Overall Cognitive Status: Within Functional Limits  for tasks assessed Arousal/Alertness: Awake/alert Orientation Level: (P) Oriented X4 Attention: Sustained Sustained Attention: Appears intact Memory: Appears intact Awareness: Appears intact Problem Solving: Appears intact Safety/Judgment: Appears intact Cognition Arousal/Alertness: Awake/alert Behavior During Therapy: WFL for tasks assessed/performed Overall Cognitive Status: Within Functional Limits for tasks assessed   Physical Exam: Blood pressure 123/71, pulse (!) 52, temperature 97.8 F (36.6 C), temperature source Oral, resp. rate 15, SpO2 97 %. Physical Exam  Vitals reviewed. Constitutional: He appears well-developed. No distress.  HENT:  Head: Normocephalic.  Eyes: Pupils are equal, round, and reactive to light. EOM are normal.  Neck: Normal range of motion.  Cardiovascular: Normal rate and regular rhythm. Exam reveals no friction rub.  No murmur heard. Respiratory: Effort normal. No respiratory distress. He has no wheezes.  GI: Soft. He exhibits no distension. There is no abdominal tenderness.  Musculoskeletal: Normal range of motion.        General: No deformity or edema.  Neurological:  Alert Makes good eye contact with examiner. Tracks to all fields. Mild right central 7. Dysarthric. RUE 2/5 deltoid,biceps and 0/5 wrist and HI.Marland Kitchen RLE: 2 to 2+/5 HF, KE and ADF/PF. Senses pain and LT in all 4's. Reasonable insight and awareness, communicates through [place] and help of wife who translates  Skin: Skin is warm.  Psychiatric: He has a normal mood and affect. His behavior is normal.      Lab Results Last 48 Hours  Results for orders placed or performed during the hospital encounter  of 05/06/18 (from the past 48 hour(s))  Glucose, capillary     Status: Abnormal    Collection Time: 05/09/18  6:13 AM  Result Value Ref Range    Glucose-Capillary 162 (H) 70 - 99 mg/dL    Comment 1 Notify RN    Glucose, capillary     Status: Abnormal    Collection Time: 05/09/18 11:52 AM    Result Value Ref Range    Glucose-Capillary 177 (H) 70 - 99 mg/dL  Glucose, capillary     Status: Abnormal    Collection Time: 05/09/18  5:34 PM  Result Value Ref Range    Glucose-Capillary 171 (H) 70 - 99 mg/dL  Glucose, capillary     Status: Abnormal    Collection Time: 05/09/18  5:36 PM  Result Value Ref Range    Glucose-Capillary 186 (H) 70 - 99 mg/dL  Glucose, capillary     Status: Abnormal    Collection Time: 05/09/18  9:44 PM  Result Value Ref Range    Glucose-Capillary 171 (H) 70 - 99 mg/dL    Comment 1 Notify RN      Comment 2 Document in Chart    Glucose, capillary     Status: None    Collection Time: 05/10/18  6:03 AM  Result Value Ref Range    Glucose-Capillary 93 70 - 99 mg/dL    Comment 1 Notify RN      Comment 2 Document in Chart    Glucose, capillary     Status: Abnormal    Collection Time: 05/10/18 11:22 AM  Result Value Ref Range    Glucose-Capillary 209 (H) 70 - 99 mg/dL    Comment 1 Notify RN      Comment 2 Document in Chart    Glucose, capillary     Status: Abnormal    Collection Time: 05/10/18  4:48 PM  Result Value Ref Range    Glucose-Capillary 106 (H) 70 - 99 mg/dL    Comment 1 Notify RN      Comment 2 Document in Chart    Glucose, capillary     Status: Abnormal    Collection Time: 05/10/18  9:05 PM  Result Value Ref Range    Glucose-Capillary 111 (H) 70 - 99 mg/dL      Imaging Results (Last 48 hours)  No results found.           Medical Problem List and Plan: 1.  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  2.  DVT Prophylaxis/Anticoagulation: Subcutaneous Lovenox. Monitor for any bleeding episodes 3. Pain Management:  Tylenol as needed 4. Mood:  Provide emotional support 5. Neuropsych: This patient iis capable of making decisions on his own behalf. 6. Skin/Wound Care:  Routine skin checks 7.  Fluids/Electrolytes/Nutrition:  Routine in and out's with follow-up chemistries 8. Diabetes mellitus with peripheral neuropathy. Hemoglobin A1c 11.3. Glucotrol 5 mg twice a day, Glucophage 1000 mg twice a day.              -pt complains of nausea at times, need to consider metformin as source             -Check blood sugars before meals and at bedtime.              -Diabetic teaching 9. Permissive hypertension. Patient on no antihypertensive medications prior to  admission.      -Monitor with increased mobility 10. Hyperlipidemia. Lipitor 11. Tobacco abuse. Counseling     Elizabeth Sauer 05/11/2018  Post Admission Physician Evaluation: 1. Functional deficits secondary  to left pontine infarct. 2. Patient is admitted to receive collaborative, interdisciplinary care between the physiatrist, rehab nursing staff, and therapy team. 3. Patient's level of medical complexity and substantial therapy needs in context of that medical necessity cannot be provided at a lesser intensity of care such as a SNF. 4. Patient has experienced substantial functional loss from his/her baseline which was documented above under the "Functional History" and "Functional Status" headings.  Judging by the patient's diagnosis, physical exam, and functional history, the patient has potential for functional progress which will result in measurable gains while on inpatient rehab.  These gains will be of substantial and practical use upon discharge  in facilitating mobility and self-care at the household level. 5. Physiatrist will provide 24 hour management of medical needs as well as oversight of the therapy plan/treatment and provide guidance as appropriate regarding the interaction of the two. 6. The Preadmission Screening has been reviewed and patient status is unchanged unless otherwise stated above. 7. 24 hour rehab nursing will assist with bladder management, bowel management, safety, skin/wound care, disease  management, medication administration, pain management and patient education  and help integrate therapy concepts, techniques,education, etc. 8. PT will assess and treat for/with: Lower extremity strength, range of motion, stamina, balance, functional mobility, safety, adaptive techniques and equipment, NMR, family ed.   Goals are: mod I to supervision. 9. OT will assess and treat for/with: ADL's, functional mobility, safety, upper extremity strength, adaptive techniques and equipment, NMR, community reentry.   Goals are: mod I to supervision. Therapy may proceed with showering this patient. 10. SLP will assess and treat for/with: n/a.  Goals are: n/a. 11. Case Management and Social Worker will assess and treat for psychological issues and discharge planning. 12. Team conference will be held weekly to assess progress toward goals and to determine barriers to discharge. 13. Patient will receive at least 3 hours of therapy per day at least 5 days per week. 14. ELOS: 7-10 days       15. Prognosis:  excellent   I have personally performed a face to face diagnostic evaluation of this patient and formulated the key components of the plan.  Additionally, I have personally reviewed laboratory data, imaging studies, as well as relevant notes and concur with the physician assistant's documentation above.  Meredith Staggers, MD, [facility]

## 2018-05-11 NOTE — Progress Notes (Signed)
Arthur Diones, RN  Rehab Admission Coordinator  Physical Medicine and Rehabilitation  PMR Pre-admission  Signed  Date of Service:  05/10/2018 9:38 PM       Related encounter: ED to Hosp-Admission (Current) from 05/06/2018 in [facility]      Signed         Show:Clear all '[x]' Manual'[x]' Template'[x]' Copied  Added by: '[x]' Karl Bales Evalee Mutton, RN  '[]' Hover for details PMR Admission Coordinator Pre-Admission Assessment  Patient: Arthur Howard is an 53 y.o., male MRN: [id number] DOB: [birth year] Height:   Weight:                                                                                                                                                    Insurance Information HMO:     PPO: Yes     PCP:       IPA:       80/20:       OTHER:   PRIMARY:  [employer] indemnity      Policy#: [id number]      Subscriber: patient CM Name: Arthur Howard     Phone#: [phone]     Fax#: [phone] Pre-Cert#: [id number] from 2/18 to 05/17/18 with updates due to [place] [phone] and fax [phone]     Employer: [employer] Benefits:  Phone #: [phone]     Name: On line portal Eff. Date: 01/22/18     Deduct: $2000 (met $0)      Out of Pocket Max: $5450 (met $0)     Life Max: N/A CIR: 90%      SNF: 90% with 60 days max per year Outpatient: 90% for 60 visits per year     Co-Pay: 10% Home Health: 90% with 60 visits combined      Co-Pay: 105 DME: 90%     Co-Pay: 10% Providers: in network  Medicaid Application Date:       Case Manager:  Disability Application Date:       Case Worker:   Emergency Publishing copy Information    Name Relation Home Work Mobile   Arthur Howard Daughter [phone]     Arthur Howard, Arthur Howard Daughter [phone]       Current Medical History  Patient Admitting Diagnosis: acute infarct left inferior ventral pons  History of Present Illness: a 53 y.o.right handed non-English-speakingmalewith  history of tobacco abuse,diabetes mellitus hyperlipidemia.Per chart reviewand daughter,patient lives with wife and daughter. 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 Cranial CT scan showed small age uncertain infarct involving a portion of the genu and posterior limb of the right internal capsule. Urine drug screen pending. CT angiogram of head and neck negative. Patient did not receive TPA. MRIshowed small  acute infarct left inferior ventral pons. No signs of hemorrhage. Echocardiogram completed, read pending. Presently maintained on aspirin and Plavix for CVA prophylaxis. Subcutaneous Lovenox for DVT prophylaxis. Tolerating a regular consistency diet.Therapy is recommending CIR.  Complete NIHSS TOTAL: 5  Past Medical History      Past Medical History:  Diagnosis Date  . Diabetes mellitus without complication ([place])   . Hyperlipidemia     Family History  family history includes Diabetes in his brother and sister.  Prior Rehab/Hospitalizations: No previous rehab, no previous hospitalizations.  Has the patient had major surgery during 100 days prior to admission? No  Current Medications   Current Facility-Administered Medications:  .  0.9 %  sodium chloride infusion, , Intravenous, Continuous, Karmen Bongo, MD, Stopped at 05/09/18 1830 .  acetaminophen (TYLENOL) tablet 650 mg, 650 mg, Oral, Q4H PRN, 650 mg at 05/08/18 1514 **OR** acetaminophen (TYLENOL) solution 650 mg, 650 mg, Per Tube, Q4H PRN **OR** acetaminophen (TYLENOL) suppository 650 mg, 650 mg, Rectal, Q4H PRN, Karmen Bongo, MD .  aspirin EC tablet 81 mg, 81 mg, Oral, Daily, Aroor, Lanice Schwab, MD, 81 mg at 05/11/18 0919 .  atorvastatin (LIPITOR) tablet 80 mg, 80 mg, Oral, Daily, Karmen Bongo, MD, 80 mg at 05/11/18 7412 .  clopidogrel (PLAVIX) tablet 75 mg, 75 mg, Oral, Daily, Aroor, Lanice Schwab, MD, 75 mg at 05/11/18 0919 .  enoxaparin (LOVENOX) injection 40 mg, 40 mg, Subcutaneous, Q24H, Karmen Bongo, MD, 40 mg at 05/10/18 1113 .  glipiZIDE (GLUCOTROL) tablet 5 mg, 5 mg, Oral, BID AC, Charlynne Cousins, MD, 5 mg at 05/11/18 [phone] .  insulin aspart (novoLOG) injection 0-15 Units, 0-15 Units, Subcutaneous, TID WC, Karmen Bongo, MD, 5 Units at 05/10/18 1148 .  insulin aspart (novoLOG) injection 0-5 Units, 0-5 Units, Subcutaneous, QHS, Karmen Bongo, MD, 3 Units at 05/07/18 2213 .  metFORMIN (GLUCOPHAGE) tablet 1,000 mg, 1,000 mg, Oral, BID WC, Charlynne Cousins, MD, 1,000 mg at 05/11/18 0818 .  oxyCODONE (Oxy IR/ROXICODONE) immediate release tablet 5 mg, 5 mg, Oral, Q4H PRN, Karmen Bongo, MD, 5 mg at 05/10/18 2120 .  senna-docusate (Senokot-S) tablet 1 tablet, 1 tablet, Oral, QHS PRN, Karmen Bongo, MD  Patients Current Diet:     Diet Order                  Diet - low sodium heart healthy               Precautions / Restrictions Precautions Precautions: Fall Precaution Comments: right hemiparesis Restrictions Weight Bearing Restrictions: No   Has the patient had 2 or more falls or a fall with injury in the past year?No  Prior Activity Level Community (5-7x/wk): Went out daily, was working [employer], was driving.  Home Assistive Devices / Equipment Home Assistive Devices/Equipment: None Home Equipment: None  Prior Device Use: Indicate devices/aids used by the patient prior to current illness, exacerbation or injury? None  Prior Functional Level Prior Function Level of Independence: Independent Comments: Worked putting in Designer, fashion/clothing Care: Did the patient need help bathing, dressing, using the toilet or eating?  Independent  Indoor Mobility: Did the patient need assistance with walking from room to room (with or without device)? Independent  Stairs: Did the patient need assistance with internal or external stairs (with or  without device)? Independent  Functional Cognition: Did the patient need help planning regular tasks such as shopping or remembering to take medications? Independent  Current Functional Level Cognition  Arousal/Alertness: Awake/alert Overall  Cognitive Status: Within Functional Limits for tasks assessed Orientation Level: Oriented X4 Attention: Sustained Sustained Attention: Appears intact Memory: Appears intact Awareness: Appears intact Problem Solving: Appears intact Safety/Judgment: Appears intact    Extremity Assessment (includes Sensation/Coordination)  Upper Extremity Assessment: Defer to OT evaluation RUE Deficits / Details: Pt currently exhibits synergy pattern with shoulder and elbow movements at Brunnstrum stage IV.  Full AROM digit flexion and extension but pt only able to oppose thumb to the first and second digits only RUE Sensation: WNL RUE Coordination: decreased fine motor, decreased gross motor  Lower Extremity Assessment: RLE deficits/detail RLE Deficits / Details: pt grossly 3+/5 for hip flexion, knee flexion, knee extension and ankle DF; 4/5 for hip adduction and abduction    ADLs  Overall ADL's : Needs assistance/impaired Eating/Feeding: Supervision/ safety Grooming: Wash/dry hands, Minimal assistance, Standing Upper Body Bathing: Minimal assistance, Sitting Lower Body Bathing: Minimal assistance, Sit to/from stand Upper Body Dressing : Minimal assistance, Sitting Lower Body Dressing: Moderate assistance, Sit to/from stand Toilet Transfer: Moderate assistance, Ambulation Toileting- Clothing Manipulation and Hygiene: Moderate assistance, Sit to/from stand Functional mobility during ADLs: Moderate assistance General ADL Comments: Pt with more physical impairment noted than on 05/07/18 eval.  Pt now with only trace digit extension and flexion where as on Friday he exhibited almost full gross AROM for flexion and extension.  He also needed mod assist for  functional mobility hand held assist with noted right knee buckline this session, which was only min assist on Friday.  Educated pt and family on AAROM exercises for the RUE and hand as well as providing handout.  Pt is an excellent CIR level candidate at this time.     Mobility  Overal bed mobility: Needs Assistance Bed Mobility: Supine to Sit Supine to sit: Min assist Sit to supine: Min guard General bed mobility comments: patient requesting assist from family to perform supine to sit, to raise trunk and move right LE off bed.     Transfers  Overall transfer level: Needs assistance Equipment used: 1 person hand held assist Transfers: Sit to/from Stand Sit to Stand: Min assist Stand pivot transfers: Mod assist General transfer comment: Pt with posterior LOB in standing as well as occasional right knee buckling with mobility.      Ambulation / Gait / Stairs / Wheelchair Mobility  Ambulation/Gait Ambulation/Gait assistance: Min assist, Mod assist Gait Distance (Feet): 200 Feet Assistive device: 1 person hand held assist Gait Pattern/deviations: Step-to pattern, Shuffle General Gait Details: min/mod hand held assist. Occasional right knee buckling with gait.   Gait velocity: decreased    Posture / Balance Balance Overall balance assessment: Needs assistance Sitting-balance support: Feet supported Sitting balance-Leahy Scale: Fair Standing balance support: Single extremity supported Standing balance-Leahy Scale: Fair Standing balance comment: Mod assist for dynamic standing balance during simulated selfcare tasks and transfers.     Special needs/care consideration BiPAP/CPAP No CPM No Continuous Drip IV No Dialysis No         Life Vest No Oxygen No Special Bed No Trach Size No Wound Vac (area) No   Skin:  No                              Bowel mgmt: Last BM 05/08/18 Bladder mgmt: Voiding in urinal and in bathroom with assist Diabetic mgmt Yes, on oral medication at  home    Previous Home Environment Living Arrangements: Children, Spouse/significant other Available Help at Discharge:  Family, Available 24 hours/day Type of Home: House Home Layout: One level Home Access: Stairs to enter Entrance Stairs-Rails: None Entrance Stairs-Number of Steps: 1 Bathroom Shower/Tub: Multimedia programmer: Standard Bathroom Accessibility: Yes How Accessible: Accessible via walker [place]: No  Discharge Living Setting Plans for Discharge Living Setting: Patient's home, House, Lives with (comment)(Lives with wife and 3 daughters.) Type of Home at Discharge: House Discharge Home Layout: One level Discharge Home Access: Stairs to enter Entrance Stairs-Number of Steps: 1 step into house Discharge Bathroom Shower/Tub: Walk-in shower, Curtain Discharge Bathroom Toilet: Standard Discharge Bathroom Accessibility: Yes How Accessible: Accessible via walker Does the patient have any problems obtaining your medications?: No  Social/Family/Support Systems Patient Roles: Spouse, Parent(Has a wife and 7 daughters.) Contact Information: Colletta [place] - daughter - [phone] Anticipated Caregiver: Wife and daughters Ability/Limitations of Caregiver: Colletta [place] to provide care while mom is working.  Verdis Frederickson, wife, works 5 am to 130 pm. Caregiver Availability: 24/7 Discharge Plan Discussed with Primary Caregiver: Yes Is Caregiver In Agreement with Plan?: Yes Does Caregiver/Family have Issues with Lodging/Transportation while Pt is in Rehab?: No   Goals/Additional Needs Patient/Family Goal for Rehab: PT/OT mod I and supervision goals Expected length of stay: 7-10 days Cultural Considerations: Speaks Spanish, understands minimal English, needs interpreter Dietary Needs: Heart Healthy, carb modified, thin liquids Equipment Needs: TBD Pt/Family Agrees to Admission and willing to participate: Yes Program Orientation Provided & Reviewed with Pt/Caregiver  Including Roles  & Responsibilities: Yes  Decrease burden of Care through IP rehab admission: No  Possible need for SNF placement upon discharge: Not planned  Patient Condition: This patient's medical and functional status has changed since the consult dated: 05/07/18 in which the Rehabilitation Physician determined and documented that the patient's condition is appropriate for intensive rehabilitative care in an inpatient rehabilitation facility. See "History of Present Illness" (above) for medical update. Functional changes are:  Currently requiring min to mod assist to ambulate 200 feet +1 HHA. Patient's medical and functional status update has been discussed with the Rehabilitation physician and patient remains appropriate for inpatient rehabilitation. Will admit to inpatient rehab today.  Preadmission Screen Completed By:  Arthur Howard, 05/11/2018 10:53 AM ______________________________________________________________________   Discussed status with Dr. Naaman Plummer on 05/11/18 at 1052  and received telephone approval for admission today.  Admission Coordinator:  Arthur Howard, time 1052/Date 05/11/18           Cosigned by: Meredith Staggers, MD at 05/11/2018 11:10 AM  Revision History

## 2018-05-12 ENCOUNTER — Inpatient Hospital Stay (HOSPITAL_COMMUNITY): Payer: Self-pay

## 2018-05-12 ENCOUNTER — Inpatient Hospital Stay (HOSPITAL_COMMUNITY): Payer: Self-pay | Admitting: Occupational Therapy

## 2018-05-12 ENCOUNTER — Encounter (HOSPITAL_COMMUNITY): Payer: Self-pay

## 2018-05-12 ENCOUNTER — Inpatient Hospital Stay (HOSPITAL_COMMUNITY): Payer: Self-pay | Admitting: Physical Therapy

## 2018-05-12 DIAGNOSIS — R110 Nausea: Secondary | ICD-10-CM

## 2018-05-12 DIAGNOSIS — G8191 Hemiplegia, unspecified affecting right dominant side: Secondary | ICD-10-CM

## 2018-05-12 LAB — COMPREHENSIVE METABOLIC PANEL
ALT: 44 U/L (ref 0–44)
AST: 38 U/L (ref 15–41)
Albumin: 3.3 g/dL — ABNORMAL LOW (ref 3.5–5.0)
Alkaline Phosphatase: 48 U/L (ref 38–126)
Anion gap: 11 (ref 5–15)
BUN: 15 mg/dL (ref 6–20)
CALCIUM: 9 mg/dL (ref 8.9–10.3)
CO2: 24 mmol/L (ref 22–32)
Chloride: 101 mmol/L (ref 98–111)
Creatinine, Ser: 0.96 mg/dL (ref 0.61–1.24)
GFR calc Af Amer: >60 mL/min (ref >60)
GFR calc non Af Amer: >60 mL/min (ref >60)
Glucose, Bld: 147 mg/dL — ABNORMAL HIGH (ref 70–99)
Potassium: 4 mmol/L (ref 3.5–5.1)
SODIUM: 136 mmol/L (ref 135–145)
Total Bilirubin: 0.8 mg/dL (ref 0.3–1.2)
Total Protein: 6.2 g/dL — ABNORMAL LOW (ref 6.5–8.1)

## 2018-05-12 LAB — CBC WITH DIFFERENTIAL/PLATELET
Abs Immature Granulocytes: 0.05 10*3/uL (ref 0.00–0.07)
Basophils Absolute: 0 10*3/uL (ref 0.0–0.1)
Basophils Relative: 1 %
EOS PCT: 1 %
Eosinophils Absolute: 0.1 10*3/uL (ref 0.0–0.5)
HCT: 46.1 % (ref 39.0–52.0)
HEMOGLOBIN: 15.7 g/dL (ref 13.0–17.0)
Immature Granulocytes: 1 %
Lymphocytes Relative: 30 %
Lymphs Abs: 2.7 10*3/uL (ref 0.7–4.0)
MCH: 30.1 pg (ref 26.0–34.0)
MCHC: 34.1 g/dL (ref 30.0–36.0)
MCV: 88.3 fL (ref 80.0–100.0)
MONO ABS: 0.7 10*3/uL (ref 0.1–1.0)
Monocytes Relative: 8 %
NEUTROS ABS: 5.1 10*3/uL (ref 1.7–7.7)
Neutrophils Relative %: 59 %
Platelets: 328 10*3/uL (ref 150–400)
RBC: 5.22 MIL/uL (ref 4.22–5.81)
RDW: 12.1 % (ref 11.5–15.5)
WBC: 8.7 10*3/uL (ref 4.0–10.5)
nRBC: 0 % (ref 0.0–0.2)

## 2018-05-12 LAB — GLUCOSE, CAPILLARY
Glucose-Capillary: 132 mg/dL — ABNORMAL HIGH (ref 70–99)
Glucose-Capillary: 160 mg/dL — ABNORMAL HIGH (ref 70–99)
Glucose-Capillary: 100 mg/dL — ABNORMAL HIGH (ref 70–99)
Glucose-Capillary: 152 mg/dL — ABNORMAL HIGH (ref 70–99)

## 2018-05-12 MED ORDER — PHENOL 1.4 % MT LIQD
1.0000 | OROMUCOSAL | Status: DC | PRN
  Administered 2018-05-12: 1 via OROMUCOSAL
  Filled 2018-05-12: qty 177

## 2018-05-12 MED ORDER — DIPHENOXYLATE-ATROPINE 2.5-0.025 MG PO TABS
1.0000 | ORAL_TABLET | Freq: Four times a day (QID) | ORAL | Status: DC | PRN
  Administered 2018-05-12 – 2018-05-19 (×2): 1 via ORAL
  Filled 2018-05-12 (×5): qty 1

## 2018-05-12 MED ORDER — FAMOTIDINE 20 MG PO TABS
20.0000 mg | ORAL_TABLET | Freq: Two times a day (BID) | ORAL | Status: DC
  Administered 2018-05-12 – 2018-05-21 (×18): 20 mg via ORAL
  Filled 2018-05-12 (×18): qty 1

## 2018-05-12 MED ORDER — INFLUENZA VAC SPLIT QUAD 0.5 ML IM SUSY: 0.5000 mL | PREFILLED_SYRINGE | INTRAMUSCULAR | Status: DC | PRN

## 2018-05-12 MED ORDER — ALUM & MAG HYDROXIDE-SIMETH 200-200-20 MG/5ML PO SUSP
30.0000 mL | ORAL | Status: DC | PRN
  Administered 2018-05-12: 30 mL via ORAL
  Filled 2018-05-12: qty 30

## 2018-05-12 NOTE — Progress Notes (Signed)
Patient information reviewed and entered into eRehab System by Becky Adalai Perl, PPS coordinator. Information including medical coding, function ability, and quality indicators will be reviewed and updated through discharge.   

## 2018-05-12 NOTE — Evaluation (Signed)
Occupational Therapy Assessment and Plan  Patient Details  Name: Arthur Howard MRN: [id number] Date of Birth: 1966/01/08  OT Diagnosis: hemiplegia affecting dominant side and muscle weakness (generalized) Rehab Potential: Rehab Potential (ACUTE ONLY): Good ELOS: 10-14 days   Today's Date: 05/12/2018 OT Individual Time: 1607-3710 OT Individual Time Calculation (min): 75 min     Problem List:  Patient Active Problem List   Diagnosis Date Noted  . Left pontine cerebrovascular accident ([place]) 05/11/2018  . Tobacco abuse   . Diabetes mellitus type 2 in nonobese ([facility])   . Uncontrolled type 2 diabetes mellitus with hyperglycemia ([place])   . CVA (cerebral vascular accident) ([place]) 05/06/2018  . Diabetes mellitus without complication ([place])   . Hyperlipidemia 05/30/2017    Past Medical History:  Past Medical History:  Diagnosis Date  . Diabetes mellitus without complication ([place])   . Hyperlipidemia    Past Surgical History:  Past Surgical History:  Procedure Laterality Date  . ANKLE SURGERY      Assessment & Plan Clinical Impression: Patient is a 53 y.o. right handed non-English-speaking male with history of tobacco abuse, diabetes mellitus, hyperlipidemia. Per chart review and daughter, patient lives with wife and daughter. 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 Presented to 04/26/2018 with right sided weakness and facial droop as well as some slurred speech. Cranial CT scan showed small age uncertain infarct involving a portion of the genuine and posterior limb of right internal capsule. Urine drug screen negative. CT angiogram of head and neck negative. Patient did not receive TPA. 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 No signs of hemorrhage. Echocardiogram with ejection fraction of 65%. Presently maintained on aspirin and Plavix for CVA prophylaxis 3 weeks then aspirin alone. Subcutaneous Lovenox for DVT prophylaxis. Tolerating a  regular diet. Therapy evaluations completed with recommendations of physical medicine rehabilitation consult. Patient was admitted for a comprehensive rehabilitation program.   Patient transferred to CIR on 05/11/2018 .    Patient currently requires mod-max with basic self-care skills secondary to muscle weakness, impaired timing and sequencing, unbalanced muscle activation, decreased coordination and decreased motor planning, decreased RUE proprioception and decreased standing balance, hemiplegia and decreased balance strategies.  Prior to hospitalization, patient could complete ADLs with independent .  Patient will benefit from skilled intervention to decrease level of assist with basic self-care skills and increase independence with basic self-care skills prior to discharge home with care partner.  Anticipate patient will require 24 hour supervision and follow up outpatient.  OT - End of Session Activity Tolerance: Tolerates 30+ min activity without fatigue Endurance Deficit: No OT Assessment Rehab Potential (ACUTE ONLY): Good OT Patient demonstrates impairments in the following area(s): Balance;Endurance;Motor;Perception;Safety;Sensory OT Basic ADL's Functional Problem(s): Eating;Grooming;Bathing;Dressing;Toileting OT Advanced ADL's Functional Problem(s): Laundry OT Transfers Functional Problem(s): Toilet;Tub/Shower OT Additional Impairment(s): Fuctional Use of Upper Extremity OT Plan OT Intensity: Minimum of 1-2 x/day, 45 to 90 minutes OT Frequency: 5 out of 7 days OT Duration/Estimated Length of Stay: 10-14 days OT Treatment/Interventions: Medical illustrator training;Community reintegration;Discharge planning;Disease mangement/prevention;DME/adaptive equipment instruction;Functional electrical stimulation;Functional mobility training;Neuromuscular re-education;Pain management;Patient/family education;Psychosocial support;Self Care/advanced ADL retraining;Skin care/wound  managment;Splinting/orthotics;Therapeutic Activities;Therapeutic Exercise;UE/LE Strength taining/ROM;UE/LE Coordination activities OT Self Feeding Anticipated Outcome(s): Supervision/setup OT Basic Self-Care Anticipated Outcome(s): Supervision OT Toileting Anticipated Outcome(s): Supervision OT Bathroom Transfers Anticipated Outcome(s): Supervision OT Recommendation Patient destination: Home Follow Up Recommendations: Outpatient OT;24 hour supervision/assistance Equipment Recommended: Tub/shower seat   Skilled Therapeutic Intervention OT eval completed with discussion of rehab process, OT purpose, POC, ELOS,  and goals.  ADL assessment completed with bathing at sit > stand level in room shower.  Pt received supine in bed agreeable to therapy session.  Therapist provided education to pt and his family (wife and daughter) to allow pt to attempt tasks and allow therapist to provide assistance as needed, as pt's wife attempting to assist pt with bed mobility despite only needing min assist.  Ambulated to shower with mod-max assist without AD due to RLE weakness and instability.  Pt required mod assist for dynamic standing balance.  Completed bathing at sit > stand level with hand over hand assist to incorporate RUE into bathing.  Pt requesting that wife assist during drying off post shower due to cultural preferences.  Pt completed dressing with mod-max assist for LB dressing.  Pt required assistance when donning shirt as he did not don RUE first, educated on hemi-technique to increase independence with future dressing tasks.  Pt asking appropriate questions about therapy process and recovery to which therapist answered to pt satisfaction.  Pt left upright in w/c with seat belt alarm on and educated pt and family to utilize call bell for assistance.    OT Evaluation Precautions/Restrictions  Precautions Precautions: Fall Restrictions Weight Bearing Restrictions: No Pain Pain Assessment Pain Scale:  0-10 Pain Score: 0-No pain Home Living/Prior Functioning Home Living Family/patient expects to be discharged to:: Private residence Living Arrangements: Children, Spouse/significant other Available Help at Discharge: Family, Available 24 hours/day Type of Home: House Home Access: Stairs to enter [employer] of Steps: 1 small and 1 large step Entrance Stairs-Rails: None Home Layout: One level Bathroom Shower/Tub: Multimedia programmer: Programmer, systems: Yes  Lives With: Spouse IADL History Homemaking Responsibilities: Yes Laundry Responsibility: Primary Homemaking Comments: makes the bed Current License: Yes Mode of Transportation: Car Occupation: Full time employment Type of Occupation: Architect Prior Function Level of Independence: Independent with basic ADLs, Independent with gait, Independent with homemaking with ambulation  Able to Take Stairs?: Yes Driving: Yes Vocation: Full time employment Vocation Requirements: Architect ADL ADL Grooming: Minimal assistance Where Assessed-Grooming: Standing at sink Upper Body Bathing: Moderate assistance Where Assessed-Upper Body Bathing: Shower Lower Body Bathing: Maximal assistance Where Assessed-Lower Body Bathing: Shower Upper Body Dressing: Moderate assistance Where Assessed-Upper Body Dressing: Chair Lower Body Dressing: Maximal assistance Where Assessed-Lower Body Dressing: Chair Walk-In Shower Transfer: Moderate assistance Social research officer, government Method: Heritage manager: Radio broadcast assistant, Grab bars Vision Baseline Vision/History: Wears glasses Wears Glasses: Reading only Patient Visual Report: No change from baseline Vision Assessment?: Yes Eye Alignment: Within Functional Limits Ocular Range of Motion: Within Functional Limits Alignment/Gaze Preference: Within Defined Limits Tracking/Visual Pursuits: Able to track stimulus in all quads without  difficulty Saccades: Within functional limits Visual Fields: No apparent deficits Perception  Perception: Within Functional Limits Praxis Praxis: Intact Cognition Overall Cognitive Status: Within Functional Limits for tasks assessed Arousal/Alertness: Awake/alert Orientation Level: Place;Person;Situation Person: Oriented Place: Oriented Situation: Oriented Year: 2020 Month: February Day of Week: Correct Memory: Appears intact Immediate Memory Recall: Sock;Blue;Bed Memory Recall: Sock;Blue;Bed Memory Recall Sock: Without Cue Memory Recall Blue: Without Cue Memory Recall Bed: Without Cue Attention: Sustained Sustained Attention: Appears intact Awareness: Appears intact Problem Solving: Appears intact Safety/Judgment: Appears intact Sensation Sensation Light Touch: Appears Intact Proprioception: Impaired by gross assessment(mild impairment RUE) Coordination Gross Motor Movements are Fluid and Coordinated: No Fine Motor Movements are Fluid and Coordinated: No Finger Nose Finger Test: unable to complete with RUE Motor  Motor Motor: Hemiplegia Mobility  Bed  Mobility Bed Mobility: Supine to Sit Supine to Sit: Minimal Assistance - Patient > 75% Transfers Sit to Stand: Minimal Assistance - Patient > 75%  Extremity/Trunk Assessment RUE Assessment RUE Assessment: Exceptions to [facility] Passive Range of Motion (PROM) Comments: WFL Active Range of Motion (AROM) Comments: shoulder flexion 45*, elbow flexion/extension antigravity, trace wrist extension, absent wrist flexion and no finger  General Strength Comments: 2/5 tricep and bicep, trace wrist extension, 0/5 wrist flexion and finger flexion/extension RUE Body System: Neuro Brunstrum levels for arm and hand: Arm;Hand Brunstrum level for arm: Stage II Synergy is developing Brunstrum level for hand: Stage I Flaccidity LUE Assessment LUE Assessment: Within Functional Limits Active Range of Motion (AROM) Comments: WFL General  Strength Comments: grossly 5/5     Refer to Care Plan for Long Term Goals  Recommendations for other services: None    Discharge Criteria: Patient will be discharged from OT if patient refuses treatment 3 consecutive times without medical reason, if treatment goals not met, if there is a change in medical status, if patient makes no progress towards goals or if patient is discharged from hospital.  The above assessment, treatment plan, treatment alternatives and goals were discussed and mutually agreed upon: by patient and by family  Adriauna Campton, [facility] 05/12/2018, 11:51 AM

## 2018-05-12 NOTE — Progress Notes (Signed)
Occupational Therapy Session Note  Patient Details  Name: Arthur Howard MRN: [id number] Date of Birth: [birth year]  Today's Date: 05/12/2018 OT Individual Time: 1030-1100 OT Individual Time Calculation (min): 30 min    Short Term Goals: Week 1:  OT Short Term Goal 1 (Week 1): Pt will complete bathing at sit > stand level with min assist OT Short Term Goal 2 (Week 1): Pt will complete LB dressing to include donning socks/shoes with min assist OT Short Term Goal 3 (Week 1): Pt will complete toilet transfers with min assist with LRAD OT Short Term Goal 4 (Week 1): Pt will utilize RUE as gross assist during self-care tasks with min assist and cues  Skilled Therapeutic Interventions/Progress Updates:  Pt resting in bed upon arrival with sister present.  Pt agreeable to therapy.  OT intervention with focus on bed mobility, sitting balance, sit<>stand, standing balance, side stepping, and RUE NMR to increase independence with BADLs.  RUE NMR see below. Pt with limited shoulder flexion (gravity eliminated), horizontal adduction (gravity eliminated), and elbow flexion (gravity elminated).  Pt also with trace UE pronation/supination.  Pt with shoulder elevation WNL with good scapular movement.  Pt performed sit<>stand from EOB with min A and sidestepping to [facility] with min A.  Pt returned to supine in bed with CGA and repositioned in bed without assistance. Pt remained in bed with all needs within reach, bed alarm activated, and sister present.   Therapy Documentation Precautions:  Precautions Precautions: Fall Restrictions Weight Bearing Restrictions: No   Pain: Pain Assessment Pain Scale: 0-10 Pain Score: 0-No pain   Other Treatments: Treatments Neuromuscular Facilitation: Right;Upper Extremity;Forced use;Activity to increase coordination;Activity to increase motor control;Activity to increase timing and sequencing;Activity to increase grading;Activity to increase lateral weight  shifting;Activity to increase sustained activation Weight Bearing Technique Weight Bearing Technique: Yes RUE Weight Bearing Technique: Extended arm seated;Forearm seated   Therapy/Group: Individual Therapy  Leroy Libman 05/12/2018, 11:51 AM

## 2018-05-12 NOTE — Progress Notes (Signed)
[place] PHYSICAL MEDICINE & REHABILITATION PROGRESS NOTE   Subjective/Complaints:  Feels nauseated  After metformin  ROS- no CP, SOB, + dizziness, no abd pain   Objective:   No results found. Recent Labs    05/11/18 1651 05/12/18 0523  WBC 11.7* 8.7  HGB 16.5 15.7  HCT 45.8 46.1  PLT 339 328   Recent Labs    05/11/18 1651 05/12/18 0523  NA  --  136  K  --  4.0  CL  --  101  CO2  --  24  GLUCOSE  --  147*  BUN  --  15  CREATININE 0.90 0.96  CALCIUM  --  9.0    Intake/Output Summary (Last 24 hours) at 05/12/2018 0851 Last data filed at 05/11/2018 1530 Gross per 24 hour  Intake -  Output 500 ml  Net -500 ml     Physical Exam: Vital Signs Blood pressure 126/73, pulse 67, temperature 98.1 F (36.7 C), resp. rate 16, height 5\' 8"  (1.727 m), weight 67.9 kg, SpO2 95 %.  General: No acute distress Mood and affect are appropriate Heart: Regular rate and rhythm no rubs murmurs or extra sounds Lungs: Clear to auscultation, breathing unlabored, no rales or wheezes Abdomen: Positive bowel sounds, soft nontender to palpation, nondistended Extremities: No clubbing, cyanosis, or edema Skin: No evidence of breakdown, no evidence of rash Neurologic: Cranial nerves II through XII intact, motor strength is 5/5 in LEFT deltoid, bicep, tricep, grip, hip flexor, knee extensors, ankle dorsiflexor and plantar flexor 3- RIght triceps, 2- finger ext , 0 in WF and FF, 0/5 delt, + trap, 2- hip /knee ext, 0 at ankle Sensory exam normal sensation to light touch in bilateral upper and lower extremities  Musculoskeletal: Full range of motion in all 4 extremities. No joint swelling    Assessment/Plan: 1. Functional deficits secondary to RIght hemiparesis which require 3+ hours per day of interdisciplinary therapy in a comprehensive inpatient rehab setting.  Physiatrist is providing close team supervision and 24 hour management of active medical problems listed below.  Physiatrist  and rehab team continue to assess barriers to discharge/monitor patient progress toward functional and medical goals  Care Tool:  Bathing    Body parts bathed by patient: Right arm, Chest, Abdomen, Front perineal area, Right upper leg, Left upper leg, Left lower leg, Face   Body parts bathed by helper: Right lower leg, Buttocks, Left arm     Bathing assist Assist Level: Maximal Assistance - Patient 24 - 49%     Upper Body Dressing/Undressing Upper body dressing   What is the patient wearing?: Pull over shirt    Upper body assist Assist Level: Moderate Assistance - Patient 50 - 74%    Lower Body Dressing/Undressing Lower body dressing      What is the patient wearing?: Underwear/pull up, Pants     Lower body assist Assist for lower body dressing: Maximal Assistance - Patient 25 - 49%     Toileting Toileting    Toileting assist       Transfers Chair/bed transfer  Transfers assist     Chair/bed transfer assist level: Moderate Assistance - Patient 50 - 74%     Locomotion Ambulation   Ambulation assist              Walk 10 feet activity   Assist           Walk 50 feet activity   Assist  Walk 150 feet activity   Assist           Walk 10 feet on uneven surface  activity   Assist           Wheelchair     Assist               Wheelchair 50 feet with 2 turns activity    Assist            Wheelchair 150 feet activity     Assist          Medical Problem List and Plan: 1.Right side weakness with facial droop and dysarthriasecondary to 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 DVT Prophylaxis/Anticoagulation: Subcutaneous Lovenox. Monitor for any bleeding episodes 3. Pain Management:Tylenol as needed 4. Mood:Provide emotional support 5. Neuropsych: This patientiiscapable of making decisions on hisown behalf. 6.  Skin/Wound Care:Routine skin checks 7. Fluids/Electrolytes/Nutrition:Routine in and out's with follow-up chemistries 8. Diabetes mellitus with peripheral neuropathy. Hemoglobin A1c 11.3. Glucotrol 5 mg twice a day, Glucophage 1000 mg twice a day. -pt complains of nausea at times, need to consider metformin as source -Check blood sugars before meals and at bedtime. -Diabetic teaching CBG (last 3)  Recent Labs    05/11/18 1643 05/11/18 2159 05/12/18 0700  GLUCAP 122* 162* 132*  Controlled 2/19 9. Permissive hypertension. Patient on no antihypertensive medications prior to [facility] with increased mobility . Vitals:   05/11/18 1949 05/12/18 0545  BP: 124/81 126/73  Pulse: 67 67  Resp: 16   Temp: 98.4 F (36.9 C) 98.1 F (36.7 C)  SpO2: 95% 95%  Controlled 2/19 10. Hyperlipidemia. Lipitor 11. Tobacco abuse. Counseling    LOS: 1 days A FACE TO FACE EVALUATION WAS PERFORMED  Charlett Blake 05/12/2018, 8:51 AM

## 2018-05-12 NOTE — Evaluation (Signed)
Physical Therapy Assessment and Plan  Patient Details  Name: Arthur Howard MRN: [id number] Date of Birth: 1965-05-16  PT Diagnosis: Abnormal posture, Abnormality of gait, Coordination disorder, Difficulty walking, Hemiparesis dominant and Muscle weakness Rehab Potential: Good ELOS: 1.5-2 weeks   Today's Date: 05/12/2018 PT Individual Time: 1305-1420 PT Individual Time Calculation (min): 75 min    Problem List:  Patient Active Problem List   Diagnosis Date Noted  . Left pontine cerebrovascular accident ([place]) 05/11/2018  . Tobacco abuse   . Diabetes mellitus type 2 in nonobese (HCC)   . Uncontrolled type 2 diabetes mellitus with hyperglycemia ([place])   . CVA (cerebral vascular accident) ([place]) 05/06/2018  . Diabetes mellitus without complication ([place])   . Hyperlipidemia 05/30/2017    Past Medical History:  Past Medical History:  Diagnosis Date  . Diabetes mellitus without complication ([place])   . Hyperlipidemia    Past Surgical History:  Past Surgical History:  Procedure Laterality Date  . ANKLE SURGERY      Assessment & Plan Clinical Impression: Patient is a 53 year old right handed non-English-speaking male with history of tobacco abuse, diabetes mellitus, hyperlipidemia. Per chart review and daughter, patient lives with wife and daughter. 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 Presented to 04/26/2018 with right sided weakness and facial droop as well as some slurred speech. Cranial CT scan showed small age uncertain infarct involving a portion of the genuine and posterior limb of right internal capsule. Urine drug screen negative. CT angiogram of head and neck negative. Patient did not receive TPA. 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 No signs of hemorrhage. Echocardiogram with ejection fraction of 65%. Presently maintained on aspirin and Plavix for CVA prophylaxis 3 weeks then aspirin alone. Subcutaneous Lovenox for DVT  prophylaxis. Tolerating a regular diet. Therapy evaluations completed with recommendations of physical medicine rehabilitation consult. Patient was admitted for a comprehensive rehabilitation program.  Patient transferred to CIR on 05/11/2018 .   Patient currently requires mod with mobility secondary to muscle weakness, decreased cardiorespiratoy endurance, decreased coordination, and decreased sitting balance, decreased standing balance, decreased postural control and decreased balance strategies.  Prior to hospitalization, patient was independent  with mobility and lived with Spouse, Family in a House home.  Home access is 1 small and 1 large stepStairs to enter.  Patient will benefit from skilled PT intervention to maximize safe functional mobility, minimize fall risk and decrease caregiver burden for planned discharge home with 24 hour supervision.  Anticipate patient will benefit from follow up OP at discharge.  PT - End of Session Activity Tolerance: Tolerates 30+ min activity with multiple rests Endurance Deficit: Yes Endurance Deficit Description: 2/2 generalized deconditioning PT Assessment Rehab Potential (ACUTE/IP ONLY): Good PT Patient demonstrates impairments in the following area(s): Balance;Endurance;Motor;Safety PT Transfers Functional Problem(s): Bed Mobility;Bed to Chair;Car;Furniture;Floor PT Locomotion Functional Problem(s): Ambulation;Stairs;Wheelchair Mobility PT Plan PT Intensity: Minimum of 1-2 x/day ,45 to 90 minutes PT Frequency: 5 out of 7 days PT Duration Estimated Length of Stay: 1.5-2 weeks PT Treatment/Interventions: Ambulation/gait training;Balance/vestibular training;Community reintegration;Discharge planning;Disease management/prevention;DME/adaptive equipment instruction;Functional electrical stimulation;Functional mobility training;Neuromuscular re-education;Pain management;Patient/family education;Psychosocial support;Skin care/wound management;Stair  training;Splinting/orthotics;Therapeutic Activities;Therapeutic Exercise;UE/LE Strength taining/ROM;UE/LE Coordination activities;Visual/perceptual remediation/compensation;Wheelchair propulsion/positioning PT Transfers Anticipated Outcome(s): supervision with LRAD PT Locomotion Anticipated Outcome(s): supervision with LRAD PT Recommendation Recommendations for Other Services: Neuropsych consult;Therapeutic Recreation consult Therapeutic Recreation Interventions: Stress management;Pet therapy Follow Up Recommendations: 24 hour supervision/assistance;Outpatient PT Patient destination: Home Equipment Recommended: To be determined  Skilled Therapeutic Intervention Patient received  in bed & agreeable to tx. Pt denies c/o pain but reports dizziness at rest that decreases a little by the end of the session - pt & family report the doctor believes this is medication related. Educated pt's family on weekly team meeting to determine d/c date, daily therapy schedule, need for only staff to assist pt OOB or out of w/c until family is checked off, and other various CIR information. Pt completes bed mobility with supervision in hospital bed with rail and in bed in apartment, sit<>stand with min assist, and stand pivot with min assist w/c<>bed and w/c<>car at sedan simulated height. Pt ambulates 35 ft x 35 ft without AD & mod assist with decreased weight shifting R and significant effort to advance RLE as pt with absent heel strike & dorsiflexion and very limited hip/knee flexion RLE during swing phase 2/2 weakness & RLE genu recurvatum in stance phase. Pt ambulates over uneven surface with mod assist with increased R knee genu recurvatum in stance phase as compared to even surface. Pt negotiates 12 steps (6" + 3") with L rail and min assist with max multimodal cuing for compensatory technique. Provided pt with R AFO & pt ambulates 60 ft with slightly more normalized gait pattern 2/2 assistance from orthotic with RLE  foot clearance - pt will likely need orthotics consult prior to d/c. Pt utilized cybex kinetron in sitting then standing with BUE support and multimodal cuing for weight shifting & full weight bearing RLE but pt continues to rely heavily on LUE for support. Task focused on BLE strengthening, weight shifting, dynamic balance & R NMR. Pt utilized nu-step with BLE only on level 2 then reducing to level 1 for 6 minutes 15 seconds total; after task HR = 72 bpm, SpO2 = 99%. Pt propels w/c back to room electing to use LLE only with supervision. At end of session pt left in bed with alarm set & needs at hand. Provided pt with stroke handout in spanish language. BP at end of session with pt supine in bed = 119/82 mmHg (LUE).  PT Evaluation Precautions/Restrictions Precautions Precautions: Fall Precaution Comments: right hemiparesis Restrictions Weight Bearing Restrictions: No  General Chart Reviewed: Yes Additional Pertinent History: HLD, DM, hx of ankle surgery Response to Previous Treatment: Patient with no complaints from previous session. Family/Caregiver Present: Yes(wife & daughter)  Vital Signs  Home Living/Prior Functioning Home Living Available Help at Discharge: Family;Available 24 hours/day Type of Home: House Home Access: Stairs to enter [employer] of Steps: 1 small and 1 large step Entrance Stairs-Rails: None Home Layout: One level  Lives With: Spouse;Family Prior Function Level of Independence: Independent with basic ADLs;Independent with gait;Independent with homemaking with ambulation  Able to Take Stairs?: Yes Driving: Yes Vocation: Full time employment Vocation Requirements: construction  Vision/Perception  Pt wears glasses for reading only at baseline. No changes in baseline vision. Perception appears WNL.   Cognition Overall Cognitive Status: Within Functional Limits for tasks assessed Arousal/Alertness: Awake/alert Awareness: Appears intact Problem  Solving: Appears intact Safety/Judgment: Appears intact  Sensation Sensation Light Touch: Appears Intact(BLE) Proprioception: Appears Intact(intact BLE, mild impairments RUE) Coordination Gross Motor Movements are Fluid and Coordinated: No(impaired RUE/RLE) Finger Nose Finger Test: unable to complete with RUE  Motor  Motor Motor: Abnormal postural alignment and control Motor - Skilled Clinical Observations: (generalized deconditioning)   Mobility Bed Mobility Bed Mobility: Rolling Right;Rolling Left;Supine to Sit;Sit to Supine Rolling Right: Supervision/verbal cueing Rolling Left: Supervision/Verbal cueing Supine to Sit: Supervision/Verbal cueing Sit to  Supine: Supervision/Verbal cueing Transfers Transfers: Stand to Sit;Sit to Stand;Stand Pivot Transfers Sit to Stand: Minimal Assistance - Patient > 75% Stand to Sit: Minimal Assistance - Patient > 75% Stand Pivot Transfers: Minimal Assistance - Patient > 75% Stand Pivot Transfer Details: Verbal cues for technique(cuing for hand placement & sequencing)  Locomotion  Gait Ambulation: Yes Gait Assistance: Moderate Assistance - Patient 50-74% Gait Distance (Feet): 35 Feet Assistive device: None Gait Gait: Yes Gait Pattern: Impaired Gait Pattern: Decreased stance time - right;Decreased step length - right;Decreased stride length;Decreased hip/knee flexion - right;Decreased dorsiflexion - right;Decreased weight shift to right;Poor foot clearance - right Gait velocity: decreased Stairs / Additional Locomotion Stairs: Yes Stairs Assistance: Minimal Assistance - Patient > 75% Stair Management Technique: One rail Left Number of Stairs: 12 Height of Stairs: (6" + 3") Wheelchair Mobility Wheelchair Mobility: Yes Wheelchair Assistance: Chartered loss adjuster: Left lower extremity Wheelchair Parts Management: Needs assistance Distance: 100 ft    Trunk/Postural Assessment  Postural Control Postural  Control: Deficits on evaluation Righting Reactions: impaired Protective Responses: impaired   Balance Balance Balance Assessed: Yes Dynamic Standing Balance Dynamic Standing - Balance Support: No upper extremity supported;During functional activity(during gait without AD) Dynamic Standing - Level of Assistance: 3: Mod assist  Extremity Assessment  RUE Assessment RUE Assessment: Exceptions to [facility] Passive Range of Motion (PROM) Comments: WFL Active Range of Motion (AROM) Comments: shoulder flexion 45*, elbow flexion/extension antigravity, trace wrist extension, absent wrist flexion and no finger  General Strength Comments: 2/5 tricep and bicep, trace wrist extension, 0/5 wrist flexion and finger flexion/extension  LUE Assessment LUE Assessment: Within Functional Limits Active Range of Motion (AROM) Comments: WFL  RLE Assessment RLE Assessment: Exceptions to [facility] Passive Range of Motion (PROM) Comments: [facility] General Strength Comments: 3-/5 hip flexion & knee extension, 0/5 dorsiflexion  LLE Assessment LLE Assessment: Within Functional Limits    Refer to Care Plan for Long Term Goals  Recommendations for other services: Neuropsych and Therapeutic Recreation  Stress management and Outing/community reintegration  Discharge Criteria: Patient will be discharged from PT if patient refuses treatment 3 consecutive times without medical reason, if treatment goals not met, if there is a change in medical status, if patient makes no progress towards goals or if patient is discharged from hospital.  The above assessment, treatment plan, treatment alternatives and goals were discussed and mutually agreed upon: by patient and by family  [place] 05/12/2018, 4:03 PM

## 2018-05-12 NOTE — Progress Notes (Signed)
Spoke with Arthur Howard regarding pt's c/o heartburn, diarrhea, sore throat and hiccups. Orders obtained. Spoke with patient and patient's family via video interpretor system regarding new medication orders and instructions on way to reduce incidence of heartburn and dietary recommendations to reduce heartburn and diarrhea. Questions answered.

## 2018-05-13 ENCOUNTER — Inpatient Hospital Stay (HOSPITAL_COMMUNITY): Payer: Self-pay

## 2018-05-13 ENCOUNTER — Inpatient Hospital Stay (HOSPITAL_COMMUNITY): Payer: Self-pay | Admitting: Occupational Therapy

## 2018-05-13 ENCOUNTER — Inpatient Hospital Stay (HOSPITAL_COMMUNITY): Payer: Self-pay | Admitting: Physical Therapy

## 2018-05-13 LAB — GLUCOSE, CAPILLARY
GLUCOSE-CAPILLARY: 153 mg/dL — AB (ref 70–99)
Glucose-Capillary: 189 mg/dL — ABNORMAL HIGH (ref 70–99)
Glucose-Capillary: 169 mg/dL — ABNORMAL HIGH (ref 70–99)
Glucose-Capillary: 108 mg/dL — ABNORMAL HIGH (ref 70–99)

## 2018-05-13 NOTE — Progress Notes (Signed)
Physical Therapy Session Note  Patient Details  Name: Arthur Howard MRN: [id number] Date of Birth: 11/02/1965  Today's Date: 05/13/2018 PT Individual Time: 1000-1100 PT Individual Time Calculation (min): 60 min   Short Term Goals: Week 1:  PT Short Term Goal 1 (Week 1): Pt will ambulate 75 ft with LRAD & min assist.  PT Short Term Goal 2 (Week 1): Pt negotiates single step without rails with LRAD & min assist.  PT Short Term Goal 3 (Week 1): Pt will complete bed<>w/c transfers with CGA.  Skilled Therapeutic Interventions/Progress Updates:    Pt received seated in w/c in room, agreeable to PT session. No complaints of pain. Pt's family present for hands-on family training with how to assist pt to bathroom. Therapist performed short distance gait and transfer with patient (CGA to min A for sit to stand, min to mod A for short distance gait) then each family member requesting to be checked off demonstrated ability to safely assist pt and to pay attention to RLE with gait. Also demonstrated how to setup seat belt alarm. Pt's wife and 2 daughters have been checked off to assist pt to the bathroom, safety plan updated. Ambulation 2 x 150 ft with min to mod A, occasional HHA with RLE AFO. Pt exhibits improved RLE clearance with use of AFO, does exhibit some knee hyperextension with gait, will continue to trial AFOs for best fit. Ascend/descend 8 stairs with one handrail and min A, step-to gait pattern with occasional v/c for safety. Nustep level 4 x 5 min with use of B UE/LE with RUE grip assist for global strengthening. Standing NMR: alt L/R colored dot taps with LUE support and manual cueing through RLE for quad control; alt L/R 1" step taps with manual cueing at RLE and use of mirror for visual feedback; mini-squats 2 x 10 reps with manual cues through RLE for quad control and at hips for equal WBing through BLE. Backwards amb 2 x 10 reps with mod A for balance, pt tends to lean posteriorly and exhibits  increase in R knee hyperextension with retropulsive gait. Pt left seated in w/c in room with family present. Interpreter present during therapy session.  Therapy Documentation Precautions:  Precautions Precautions: Fall Precaution Comments: right hemiparesis Restrictions Weight Bearing Restrictions: No    Therapy/Group: Individual Therapy  Excell Seltzer, PT, DPT  05/13/2018, 12:10 PM

## 2018-05-13 NOTE — Progress Notes (Signed)
Physical Therapy Session Note  Patient Details  Name: Arthur Howard MRN: [id number] Date of Birth: Nov 30, 1965  Today's Date: 05/13/2018 PT Individual Time: 1300-1415 PT Individual Time Calculation (min): 75 min   Short Term Goals: Week 1:  PT Short Term Goal 1 (Week 1): Pt will ambulate 75 ft with LRAD & min assist.  PT Short Term Goal 2 (Week 1): Pt negotiates single step without rails with LRAD & min assist.  PT Short Term Goal 3 (Week 1): Pt will complete bed<>w/c transfers with CGA.  Skilled Therapeutic Interventions/Progress Updates:   Medical interpreter Di Kindle here for session. Pt resting in bed.  Rolling L><R with cues for RUE, supervision.  L side lying> sit with min assist.  Bed mobility training for supine> sit on R side of bed, as per home set up,  X 3 trials, mod assist progressing to CGA with mod cues for technique.  Pt's dtrs donned socks and shoes.  Sit>< stand x 2 from bed with min assist.  Stand pivot transfer to w/c with min asisst.  neuromuscular re-education via forced use for alternating reciprocal movement, propelling wc using bil LEs, x 100' iwht superivsion, cues for slower speed for safety of R foot.  Use of KInetron from w/c level, resistance 40 cm/sec x 25 cycles x 2 using bil LEs; x 10 cycles x 2 using RLE only (no UEs) at resistance 70 cm/sec. Gait training with R AFO, posterior leaf spring, wihtout AD x 30' with min/mod assist.   Step ups onto 3" high step with bil UE support, focusing on R knee extension without hyperextension, without AFO on.  To facilitate R ankle DF, r hip flexion, and R knee flexion, without AFO, pt kicked cones and Yoga block in standing with LUE support, with improving R knee flexion noted with practice.  Gait training with RW, R hand splint, RAFO x 150' on level tile with min assist.  Cues for upright trunk, forward gaze.  Pt has a fast gait (habitual) which exacerbates L knee hyperextension.  When ambulating slower and with cues he is  able to control it somewhat.  He would benefit from orthotic assessment by Brooke Pace, CPO.   Family took pt back to room.  They are approved for transfers with pt.     Therapy Documentation Precautions:  Precautions Precautions: Fall Precaution Comments: right hemiparesis Restrictions Weight Bearing Restrictions: No  Pain: pt denies       Therapy/Group: Individual Therapy  Kidus Delman 05/13/2018, 5:06 PM

## 2018-05-13 NOTE — IPOC Note (Addendum)
Overall Plan of Care [facility]) Patient Details Name: Arthur Howard MRN: [id number] DOB: 06/21/1965  Admitting Diagnosis: <principal problem not specified>  Hospital Problems: Active Problems:   Left pontine cerebrovascular accident [facility])     Functional Problem List: Nursing Endurance, Medication Management, Motor, Safety, Sensory, Skin Integrity, Perception  PT Balance, Endurance, Motor, Safety  OT Balance, Endurance, Motor, Perception, Safety, Sensory  SLP    TR         Basic ADL's: OT Eating, Grooming, Bathing, Dressing, Toileting     Advanced  ADL's: OT Laundry     Transfers: PT Bed Mobility, Bed to Chair, Musician, [employer], Futures trader, Metallurgist: PT Ambulation, Data processing manager, Emergency planning/management officer     Additional Impairments: OT Fuctional Use of Upper Extremity  SLP        TR      Anticipated Outcomes Item Anticipated Outcome  Self Feeding Supervision/setup  Swallowing      Basic self-care  Media planner Transfers Supervision  Bowel/Bladder  Min assist  Transfers  supervision with LRAD  Locomotion  supervision with LRAD  Communication     Cognition     Pain  < 4  Safety/Judgment  Supervision   Therapy Plan: PT Intensity: Minimum of 1-2 x/day ,45 to 90 minutes PT Frequency: 5 out of 7 days PT Duration Estimated Length of Stay: 1.5-2 weeks OT Intensity: Minimum of 1-2 x/day, 45 to 90 minutes OT Frequency: 5 out of 7 days OT Duration/Estimated Length of Stay: 10-14 days      Team Interventions: Nursing Interventions Patient/Family Education, Disease Management/Prevention, Medication Management, Discharge Planning, Psychosocial Support  PT interventions Ambulation/gait training, Training and development officer, Community reintegration, Discharge planning, Disease management/prevention, DME/adaptive equipment instruction, Functional electrical stimulation, Functional mobility training, Neuromuscular  re-education, Pain management, Patient/family education, Psychosocial support, Skin care/wound management, Stair training, Splinting/orthotics, Therapeutic Activities, Therapeutic Exercise, UE/LE Strength taining/ROM, UE/LE Coordination activities, Visual/perceptual remediation/compensation, Wheelchair propulsion/positioning  OT Interventions Training and development officer, Academic librarian, Discharge planning, Disease mangement/prevention, Engineer, drilling, Functional electrical stimulation, Functional mobility training, Neuromuscular re-education, Pain management, Patient/family education, Psychosocial support, Self Care/advanced ADL retraining, Skin care/wound managment, Splinting/orthotics, Therapeutic Activities, Therapeutic Exercise, UE/LE Strength taining/ROM, UE/LE Coordination activities  SLP Interventions    TR Interventions    SW/CM Interventions Discharge Planning, Psychosocial Support, Patient/Family Education   Barriers to Discharge MD  Medical stability  Nursing      PT      OT      SLP      SW       Team Discharge Planning: Destination: PT-Home ,OT- Home , SLP-  Projected Follow-up: PT-24 hour supervision/assistance, Outpatient PT, OT-  Outpatient OT, 24 hour supervision/assistance, SLP-  Projected Equipment Needs: PT-To be determined, OT- Tub/shower seat, SLP-  Equipment Details: PT- , OT-  Patient/family involved in discharge planning: PT- Patient, Family member/caregiver,  OT-Patient, Family member/caregiver, SLP-   MD ELOS: 7-10d Medical Rehab Prognosis:  Good Assessment:  53 year old right handed non-English-speaking male with history of tobacco abuse, diabetes mellitus, hyperlipidemia. Per chart review and daughter, patient lives with wife and daughter. 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 Presented to 04/26/2018 with right sided weakness and facial droop as well as some slurred speech. Cranial CT scan showed small  age uncertain infarct involving a portion of the genuine and posterior limb of right internal capsule. Urine drug screen negative. CT angiogram of head and neck negative. Patient did not receive  TPA. 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 No signs of hemorrhage. Echocardiogram with ejection fraction of 65%. Presently maintained on aspirin and Plavix for CVA prophylaxis 3 weeks then aspirin alone. Subcutaneous Lovenox for DVT prophylaxis   Now requiring 24/7 Rehab RN,MD, as well as CIR level PT, OT and SLP.  Treatment team will focus on ADLs and mobility with goals set at Supervision  See Team Conference Notes for weekly updates to the plan of care

## 2018-05-13 NOTE — Progress Notes (Signed)
Pt given tylenol for leg pain with relief. Pt states no diarrhea or heartburn this evening. Resting in bed. Wife is cleared to ambulate pt to BR. Educated to the need to keep the bed alarm on whenever pt is in bed.

## 2018-05-13 NOTE — Progress Notes (Signed)
Occupational Therapy Session Note  Patient Details  Name: Arthur Howard MRN: [id number] Date of Birth: 04/05/1965  Today's Date: 05/13/2018 OT Individual Time: 0905-1000 OT Individual Time Calculation (min): 55 min    Short Term Goals: Week 1:  OT Short Term Goal 1 (Week 1): Pt will complete bathing at sit > stand level with min assist OT Short Term Goal 2 (Week 1): Pt will complete LB dressing to include donning socks/shoes with min assist OT Short Term Goal 3 (Week 1): Pt will complete toilet transfers with min assist with LRAD OT Short Term Goal 4 (Week 1): Pt will utilize RUE as gross assist during self-care tasks with min assist and cues  Skilled Therapeutic Interventions/Progress Updates:    Pt seen for ADL training with a focus on use of R side and adaptive approaches.  Pt was able to sit up to side of bed with bed flat and no rails with S.  Sit to stand and transfer with CGA to wc as pt was actively using his R leg to take steps.   He doffed clothing with min A and then transferred into shower.  He used wash mit on R hand and was able to actively use R arm to wash thighs and trunk.   He used a long sponge for his feet and to reach under his L arm with min cues.  He stood in shower with CGA and attempted to reach his bottom with R hand with mit but was unable to extend shoulder enough.  With L hand he could wash with R arm supported on grab bar and CGA for balance.  He transferred back to chair to dress with min A overall learning hemi dressing techniques.  Pt is developing shoulder flex and elbow flex but no wrist ext and trace in finger extension. Pt agreeable to trying estim for R wrist extension.  Explained purpose of estim and what to expect.  Empi small muscle atrophy setting used for 15 min at intensity 24.  Pt had a smooth response of wrist and finger extension and he tolerated it well. Pt told to focus on his hand and visualize that he was lifting his hand.  Pt focuses well. After  treatment pt had 10-20  Degrees of finger extension as opposed to trace (< 5 degrees).    PT arrived for his next session.    Therapy Documentation Precautions:  Precautions Precautions: Fall Precaution Comments: right hemiparesis Restrictions Weight Bearing Restrictions: No   Pain: Pain Assessment Pain Score: 0-No pain   Therapy/Group: Individual Therapy  Earl 05/13/2018, 12:32 PM

## 2018-05-13 NOTE — Progress Notes (Signed)
[name] PHYSICAL MEDICINE & REHABILITATION PROGRESS NOTE   Subjective/Complaints:    ROS- no CP, SOB, + dizziness, no abd pain   Objective:   No results found. Recent Labs    05/11/18 1651 05/12/18 0523  WBC 11.7* 8.7  HGB 16.5 15.7  HCT 45.8 46.1  PLT 339 328   Recent Labs    05/11/18 1651 05/12/18 0523  NA  --  136  K  --  4.0  CL  --  101  CO2  --  24  GLUCOSE  --  147*  BUN  --  15  CREATININE 0.90 0.96  CALCIUM  --  9.0    Intake/Output Summary (Last 24 hours) at 05/13/2018 0854 Last data filed at 05/13/2018 0730 Gross per 24 hour  Intake 960 ml  Output -  Net 960 ml     Physical Exam: Vital Signs Blood pressure 127/84, pulse 63, temperature 98.9 F (37.2 C), resp. rate 18, height 5\' 8"  (1.727 m), weight 67.9 kg, SpO2 96 %.  General: No acute distress Mood and affect are appropriate Heart: Regular rate and rhythm no rubs murmurs or extra sounds Lungs: Clear to auscultation, breathing unlabored, no rales or wheezes Abdomen: Positive bowel sounds, soft nontender to palpation, nondistended Extremities: No clubbing, cyanosis, or edema Skin: No evidence of breakdown, no evidence of rash Neurologic: Cranial nerves II through XII intact, motor strength is 5/5 in LEFT deltoid, bicep, tricep, grip, hip flexor, knee extensors, ankle dorsiflexor and plantar flexor 3- RIght triceps, 2- finger ext , 0 in WF and FF, 0/5 delt, + trap, 2- hip /knee ext, 0 at ankle Sensory exam normal sensation to light touch in bilateral upper and lower extremities  Musculoskeletal: Full range of motion in all 4 extremities. No joint swelling    Assessment/Plan: 1. Functional deficits secondary to RIght hemiparesis which require 3+ hours per day of interdisciplinary therapy in a comprehensive inpatient rehab setting.  Physiatrist is providing close team supervision and 24 hour management of active medical problems listed below.  Physiatrist and rehab team continue to assess  barriers to discharge/monitor patient progress toward functional and medical goals  Care Tool:  Bathing    Body parts bathed by patient: Right arm, Chest, Abdomen, Front perineal area, Right upper leg, Left upper leg, Left lower leg, Face   Body parts bathed by helper: Right lower leg, Buttocks, Left arm     Bathing assist Assist Level: Maximal Assistance - Patient 24 - 49%     Upper Body Dressing/Undressing Upper body dressing   What is the patient wearing?: Pull over shirt    Upper body assist Assist Level: Minimal Assistance - Patient > 75%    Lower Body Dressing/Undressing Lower body dressing      What is the patient wearing?: Underwear/pull up     Lower body assist Assist for lower body dressing: Moderate Assistance - Patient 50 - 74%     Toileting Toileting    Toileting assist Assist for toileting: Moderate Assistance - Patient 50 - 74%     Transfers Chair/bed transfer  Transfers assist     Chair/bed transfer assist level: Minimal Assistance - Patient > 75%     Locomotion Ambulation   Ambulation assist      Assist level: Moderate Assistance - Patient 50 - 74% Assistive device: (none) Max distance: 35 ft   Walk 10 feet activity   Assist     Assist level: Moderate Assistance - Patient - 50 - 74%  Walk 50 feet activity   Assist Walk 50 feet with 2 turns activity did not occur: Safety/medical concerns         Walk 150 feet activity   Assist Walk 150 feet activity did not occur: Safety/medical concerns         Walk 10 feet on uneven surface  activity   Assist     Assist level: Moderate Assistance - Patient - 50 - 74%     Wheelchair     Assist Will patient use wheelchair at discharge?: No Type of Wheelchair: Manual    Wheelchair assist level: Supervision/Verbal cueing Max wheelchair distance: 100 ft     Wheelchair 50 feet with 2 turns activity    Assist        Assist Level: Supervision/Verbal  cueing   Wheelchair 150 feet activity     Assist          Medical Problem List and Plan: 1.Right side weakness with facial droop and dysarthriasecondary to 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 DVT Prophylaxis/Anticoagulation: Subcutaneous Lovenox. Monitor for any bleeding episodes 3. Pain Management:Tylenol as needed 4. Mood:Provide emotional support 5. Neuropsych: This patientiiscapable of making decisions on hisown behalf. 6. Skin/Wound Care:Routine skin checks 7. Fluids/Electrolytes/Nutrition:Routine in and out's with follow-up chemistries 8. Diabetes mellitus with peripheral neuropathy. Hemoglobin A1c 11.3. Glucotrol 5 mg twice a day, Glucophage 1000 mg twice a day. -pt complains of nausea at times, need to consider metformin as source -Check blood sugars before meals and at bedtime. -Diabetic teaching CBG (last 3)  Recent Labs    05/12/18 1646 05/12/18 2132 05/13/18 0620  GLUCAP 100* 152* 153*  Controlled 2/20 9. Permissive hypertension. Patient on no antihypertensive medications prior to [facility] with increased mobility . Vitals:   05/12/18 1942 05/13/18 0619  BP: 128/82 127/84  Pulse: 67 63  Resp: 18 18  Temp: 98 F (36.7 C) 98.9 F (37.2 C)  SpO2: 97% 96%  Controlled 2/20 10. Hyperlipidemia. Lipitor 11. Tobacco abuse. Counseling    LOS: 2 days A FACE TO FACE EVALUATION WAS PERFORMED  Charlett Blake 05/13/2018, 8:54 AM

## 2018-05-14 ENCOUNTER — Inpatient Hospital Stay (HOSPITAL_COMMUNITY): Payer: Self-pay | Admitting: Physical Therapy

## 2018-05-14 ENCOUNTER — Inpatient Hospital Stay (HOSPITAL_COMMUNITY): Payer: Self-pay | Admitting: Occupational Therapy

## 2018-05-14 LAB — GLUCOSE, CAPILLARY
Glucose-Capillary: 123 mg/dL — ABNORMAL HIGH (ref 70–99)
Glucose-Capillary: 209 mg/dL — ABNORMAL HIGH (ref 70–99)
Glucose-Capillary: 189 mg/dL — ABNORMAL HIGH (ref 70–99)
Glucose-Capillary: 116 mg/dL — ABNORMAL HIGH (ref 70–99)

## 2018-05-14 NOTE — Progress Notes (Signed)
Physical Therapy Session Note  Patient Details  Name: Arthur Howard MRN: [id number] Date of Birth: 1965-09-04  Today's Date: 05/14/2018 PT Individual Time: 5643-3295 PT Individual Time Calculation (min): 68 min   Short Term Goals: Week 1:  PT Short Term Goal 1 (Week 1): Pt will ambulate 75 ft with LRAD & min assist.  PT Short Term Goal 2 (Week 1): Pt negotiates single step without rails with LRAD & min assist.  PT Short Term Goal 3 (Week 1): Pt will complete bed<>w/c transfers with CGA.  Skilled Therapeutic Interventions/Progress Updates:  Pt received in bed & agreeable to tx. Pt denies c/o pain at rest. Pt transfers to EOB with supervision & hospital bed features and therapist/family assists pt with donning shoes & R AFO for time management. Pt ambulates room>gym with RW, R hand orthosis, and R AFO with min assist with pt experiencing R knee buckling x 1, with frequent R genu recurvatum. Pt performs lateral step ups on 3" step with LUE support & min assist for balance with task focusing on R hip/knee flexion & hip abduction strengthening & R NMR. Pt reports sharp pain with first few initial step ups then reports he no longer had pain with activity. Pt transferred onto mat table and performed BLE bridging with adductor hold and RLE single leg bridging with task focusing on RLE strengthening & NMR. Pt transitioned to quadruped and elevated 1 extremity at a time, progressing to bird dog exercises with min assist for balance with task focusing on R NMR, RUE weight bearing, core/trunk control & strengthening. Pt engaged in table top task while in tall kneeling position with task focusing on R hip extensor strengthening, core/trunk control. Pt then performed sit<>stand with LLE elevated on 6" step with cuing for anterior/R weight shifting and full R knee & hip extension with task focusing on RLE strengthening & NMR. Pt engaged in kicking soccer ball with min assist for balance with task focusing on RLE  strengthening & NMR via forced use, weight shifting L<>R, and dynamic balance. Pt ambulates 150 ft + gym>dayroom>room without AD & R AFO with min assist with decreased weight shifting R and 1 episode of RLE knee buckling but pt able to correct. Pt utilized cybex kinetron in standing with BUE then RUE support with task focusing on weight shifting, dynamic balance, and RLE strengthening & NMR. At end of session pt returns to bed & is left with alarm set & family present to supervise.   Therapy Documentation Precautions:  Precautions Precautions: Fall Precaution Comments: right hemiparesis Restrictions Weight Bearing Restrictions: No    Therapy/Group: Individual Therapy  KEJON FEILD 05/14/2018, 2:17 PM

## 2018-05-14 NOTE — Patient Care Conference (Signed)
Inpatient RehabilitationTeam Conference and Plan of Care Update Date: 05/12/2018   Time: 10:55 AM    Patient Name: Arthur Howard      Medical Record Number: [id number]  Date of Birth: 1965-08-15 Sex: Male         Room/Bed: 4W19C/4W19C-01 Payor Info: Payor: [employer] / Plan: C[employer] INDEMNITY / Product Type: *No Product type* /    Admitting Diagnosis: L CVA  Admit Date/Time:  05/11/2018  3:14 PM Admission Comments: No comment available   Primary Diagnosis:  <principal problem not specified> Principal Problem: <principal problem not specified>  Patient Active Problem List   Diagnosis Date Noted  . Left pontine cerebrovascular accident ([place]) 05/11/2018  . Tobacco abuse   . Diabetes mellitus type 2 in nonobese ([facility])   . Uncontrolled type 2 diabetes mellitus with hyperglycemia ([place])   . CVA (cerebral vascular accident) ([place]) 05/06/2018  . Diabetes mellitus without complication ([place])   . Hyperlipidemia 05/30/2017    Expected Discharge Date: Expected Discharge Date: (10-14 days)  Team Members Present: Physician leading conference: Dr. Alysia Penna Social Worker Present: Alfonse Alpers, LCSW Nurse Present: Rayetta Pigg, RN PT Present: Lavone Nian, PT OT Present: Simonne Come, OT SLP Present: Charolett Bumpers, SLP PPS Coordinator present : Gunnar Fusi     Current Status/Progress Goal Weekly Team Focus  Medical   Right hemiparesis, uncontrolled diabetes, hypertension  Improve stroke risk factor management  Initiate rehabilitation program   Bowel/Bladder   Continent of bowel and bladder LBM 2/18  remain continent  assess bowel and bladder needs qshift PRN   Swallow/Nutrition/ Hydration             ADL's   mod assist bathing and dressing, mod-max assist transfers without AD  Supervision overall  ADL retraining, dynamic standing balance, RUE NMR, pt/family education   Mobility   mod assist gait without AD, min assist transfers, min assist stair negotiation with 1 rail,  supervision bed mobility  supervision overall except min assist floor transfer  transfers, gait, balance, endurance, stair negotiation, R NMR, pt education   Communication             Safety/Cognition/ Behavioral Observations            Pain   C/o pain to right knee relieved with heat pack. PRN tylenol ordered. Pt apprehensive about pain medication.  Pain less than 3  Assess pain management qshift and PRN   Skin              Rehab Goals Patient on target to meet rehab goals: Yes Rehab Goals Revised: none - pt's first conference *See Care Plan and progress notes for long and short-term goals.     Barriers to Discharge  Current Status/Progress Possible Resolutions Date Resolved   Physician    Medical stability     Initial therapy eval's in progress  Continue rehab      Nursing                  PT                    OT                  SLP                SW                Discharge Planning/Teaching Needs:  Pt plans to return to his home and family will take  turns being with him to provide supervision.  Family education to be provided to pt prior to d/c, although family is here a lot during therapy already.   Team Discussion:  Pt with left pontine infarct and right hemiparesis.  Pt has some extension in his upper extremity and lower extremity is anti-gravity.  Pt seems to have equal sensation.  Pt is continent.  Family wants to do everything for pt, so education will be needed.  Pt walked without device mod/max A.  He was min A for txs and for seated bathing and dressing.  OT gave instructions and pt can follow.  He is motivated.  No interpreter present today.  CSW to assure interpreter arranged going forward.  Revisions to Treatment Plan:  none    Continued Need for Acute Rehabilitation Level of Care: The patient requires daily medical management by a physician with specialized training in physical medicine and rehabilitation for the following conditions: Daily direction of  a multidisciplinary physical rehabilitation program to ensure safe treatment while eliciting the highest outcome that is of practical value to the patient.: Yes Daily medical management of patient stability for increased activity during participation in an intensive rehabilitation regime.: Yes Daily analysis of laboratory values and/or radiology reports with any subsequent need for medication adjustment of medical intervention for : Neurological problems;Diabetes problems;Blood pressure problems   I attest that I was present, lead the team conference, and concur with the assessment and plan of the team.   Kenetra Hildenbrand, Silvestre Mesi 05/14/2018, 2:23 PM

## 2018-05-14 NOTE — Progress Notes (Signed)
[place] PHYSICAL MEDICINE & REHABILITATION PROGRESS NOTE   Subjective/Complaints:  No issues overnite, nasusea and diarrhea resolved Discussed BP and CBGs  ROS- no CP, SOB, + dizziness, no abd pain   Objective:   No results found. Recent Labs    05/11/18 1651 05/12/18 0523  WBC 11.7* 8.7  HGB 16.5 15.7  HCT 45.8 46.1  PLT 339 328   Recent Labs    05/11/18 1651 05/12/18 0523  NA  --  136  K  --  4.0  CL  --  101  CO2  --  24  GLUCOSE  --  147*  BUN  --  15  CREATININE 0.90 0.96  CALCIUM  --  9.0    Intake/Output Summary (Last 24 hours) at 05/14/2018 0818 Last data filed at 05/13/2018 1700 Gross per 24 hour  Intake 440 ml  Output -  Net 440 ml     Physical Exam: Vital Signs Blood pressure 121/71, pulse (!) 52, temperature 97.6 F (36.4 C), temperature source Oral, resp. rate 17, height 5\' 8"  (1.727 m), weight 67.9 kg, SpO2 100 %.  General: No acute distress Mood and affect are appropriate Heart: Regular rate and rhythm no rubs murmurs or extra sounds Lungs: Clear to auscultation, breathing unlabored, no rales or wheezes Abdomen: Positive bowel sounds, soft nontender to palpation, nondistended Extremities: No clubbing, cyanosis, or edema Skin: No evidence of breakdown, no evidence of rash Neurologic: Cranial nerves II through XII intact, motor strength is 5/5 in LEFT deltoid, bicep, tricep, grip, hip flexor, knee extensors, ankle dorsiflexor and plantar flexor 3- RIght triceps, 2- finger ext , 0 in WF and FF, 0/5 delt, + trap, 2- hip /knee ext, 0 at ankle Sensory exam normal sensation to light touch in bilateral upper and lower extremities  Musculoskeletal: Full range of motion in all 4 extremities. No joint swelling    Assessment/Plan: 1. Functional deficits secondary to RIght hemiparesis which require 3+ hours per day of interdisciplinary therapy in a comprehensive inpatient rehab setting.  Physiatrist is providing close team supervision and 24  hour management of active medical problems listed below.  Physiatrist and rehab team continue to assess barriers to discharge/monitor patient progress toward functional and medical goals  Care Tool:  Bathing    Body parts bathed by patient: Right arm, Chest, Abdomen, Front perineal area, Right upper leg, Left upper leg, Left lower leg, Face, Left arm, Buttocks, Right lower leg(used long sponge)   Body parts bathed by helper: Right lower leg, Buttocks, Left arm     Bathing assist Assist Level: Contact Guard/Touching assist     Upper Body Dressing/Undressing Upper body dressing   What is the patient wearing?: Pull over shirt    Upper body assist Assist Level: Minimal Assistance - Patient > 75%    Lower Body Dressing/Undressing Lower body dressing      What is the patient wearing?: Underwear/pull up, Pants     Lower body assist Assist for lower body dressing: Minimal Assistance - Patient > 75%     Toileting Toileting    Toileting assist Assist for toileting: Moderate Assistance - Patient 50 - 74%     Transfers Chair/bed transfer  Transfers assist     Chair/bed transfer assist level: Minimal Assistance - Patient > 75%     Locomotion Ambulation   Ambulation assist      Assist level: Minimal Assistance - Patient > 75% Assistive device: Walker-rolling Max distance: 150   Walk 10 feet activity  Assist     Assist level: Minimal Assistance - Patient > 75% Assistive device: Walker-rolling   Walk 50 feet activity   Assist Walk 50 feet with 2 turns activity did not occur: Safety/medical concerns  Assist level: Minimal Assistance - Patient > 75% Assistive device: Walker-rolling    Walk 150 feet activity   Assist Walk 150 feet activity did not occur: Safety/medical concerns  Assist level: Moderate Assistance - Patient - 50 - 74% Assistive device: Hand held assist    Walk 10 feet on uneven surface  activity   Assist     Assist level:  Moderate Assistance - Patient - 50 - 74%     Wheelchair     Assist Will patient use wheelchair at discharge?: No Type of Wheelchair: Manual    Wheelchair assist level: Supervision/Verbal cueing Max wheelchair distance: 100 ft     Wheelchair 50 feet with 2 turns activity    Assist        Assist Level: Supervision/Verbal cueing   Wheelchair 150 feet activity     Assist          Medical Problem List and Plan: 1.Right side weakness with facial droop and dysarthriasecondary to 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 DVT Prophylaxis/Anticoagulation: Subcutaneous Lovenox. Monitor for any bleeding episodes 3. Pain Management:Tylenol as needed 4. Mood:Provide emotional support 5. Neuropsych: This patientiiscapable of making decisions on hisown behalf. 6. Skin/Wound Care:Routine skin checks 7. Fluids/Electrolytes/Nutrition:Routine in and out's with follow-up chemistries 8. Diabetes mellitus with peripheral neuropathy. Hemoglobin A1c 11.3. Glucotrol 5 mg twice a day, Glucophage d/ced due to nausea -pt complains of nausea at times, need to consider metformin as source -Check blood sugars before meals and at bedtime. -Diabetic teaching CBG (last 3)  Recent Labs    05/13/18 1703 05/13/18 2114 05/14/18 0633  GLUCAP 169* 108* 123*  Controlled 2/21 glucotrol only 9. Permissive hypertension. Patient on no antihypertensive medications prior to [facility] with increased mobility . Vitals:   05/13/18 2022 05/14/18 0455  BP: 122/74 121/71  Pulse: 61 (!) 52  Resp: 17 17  Temp: 97.8 F (36.6 C) 97.6 F (36.4 C)  SpO2: 96% 100%  Controlled 2/21 no meds 10. Hyperlipidemia. Lipitor 11. Tobacco abuse. Counseling    LOS: 3 days A FACE TO FACE EVALUATION WAS PERFORMED  Charlett Blake 05/14/2018, 8:18 AM

## 2018-05-14 NOTE — Progress Notes (Signed)
Occupational Therapy Session Note  Patient Details  Name: Arthur Howard MRN: [id number] Date of Birth: 07-12-1965  Today's Date: 05/14/2018 OT Individual Time: 0903-1000 OT Individual Time Calculation (min): 57 min    Short Term Goals: Week 1:  OT Short Term Goal 1 (Week 1): Pt will complete bathing at sit > stand level with min assist OT Short Term Goal 2 (Week 1): Pt will complete LB dressing to include donning socks/shoes with min assist OT Short Term Goal 3 (Week 1): Pt will complete toilet transfers with min assist with LRAD OT Short Term Goal 4 (Week 1): Pt will utilize RUE as gross assist during self-care tasks with min assist and cues  Skilled Therapeutic Interventions/Progress Updates:    Treatment session with focus on ADL retraining, functional mobility, and RUE NMR.  Pt received semi-reclined in bed declining bathing/dressing this session.  Completed bed mobility with supervision and donned socks with increased time and cues for increased technique.  Pt required assistance when donning Lt shoe with AFO and tying both shoes.  Ambulated to therapy gym with RW with Rt hand orthosis and min assist.  Engaged in [place] in supine and sitting with focus on shoulder and elbow movements in initially in gravity eliminated and then against gravity.  Engaged in reaching overhead with BUE with use of dowel for improved symmetrical movements, followed by reaching and increased ROM with RUE.  Applied e-stim to Rt wrist and finger extensors with focus on increased activation of extension while focusing on flexion between cycles of stimulation.  Utilized on level 24 with pt noting increased finger extension this session.  Pt tolerated 15 mins and reports no pain during or after stimulation.  Pt able to elicit increased wrist extension post stimulation.  Pt passed off to PT.  Therapy Documentation Precautions:  Precautions Precautions: Fall Precaution Comments: right  hemiparesis Restrictions Weight Bearing Restrictions: No General:   Vital Signs: Therapy Vitals Temp: (!) 97.5 F (36.4 C) Pulse Rate: 65 Resp: 20 BP: 119/71 Patient Position (if appropriate): Lying Oxygen Therapy SpO2: 98 % O2 Device: Room Air Pain:  Pt with no c/o pain   Therapy/Group: Individual Therapy  Simonne Come 05/14/2018, 2:38 PM

## 2018-05-14 NOTE — Plan of Care (Signed)
  Problem: Consults Goal: RH STROKE PATIENT EDUCATION Description See Patient Education module for education specifics  Outcome: Progressing Goal: Diabetes Guidelines if Diabetic/Glucose > 140 Description If diabetic or lab glucose is > 140 mg/dl - Initiate Diabetes/Hyperglycemia Guidelines & Document Interventions  Outcome: Progressing   Problem: RH SKIN INTEGRITY Goal: RH STG SKIN FREE OF INFECTION/BREAKDOWN Description Patients skin will remain free from infection or further breakdown with min assist.  Outcome: Progressing   Problem: RH SAFETY Goal: RH STG ADHERE TO SAFETY PRECAUTIONS W/ASSISTANCE/DEVICE Description STG Adhere to Safety Precautions With supervision Assistance/Device.  Outcome: Progressing   Problem: RH KNOWLEDGE DEFICIT Goal: RH STG INCREASE KNOWLEDGE OF DIABETES Description Patient/caregiver will verbalize/demonstrate understanding of DM including medications, diet, follow up care, and blood sugar monitoring with min assist.  Outcome: Progressing Goal: RH STG INCREASE KNOWLEGDE OF HYPERLIPIDEMIA Description Patient/caregiver will verbalize/demonstrate understanding of hyperlipidemia including medications, diet, follow up care, and routine testing with min assist.  Outcome: Progressing Goal: RH STG INCREASE KNOWLEDGE OF STROKE PROPHYLAXIS Description Patient/caregiver will verbalize/demonstrate understanding of stroke prophylaxis including medications, diet, follow up care, and management of comorbidities with min assist.  Outcome: Progressing

## 2018-05-14 NOTE — Progress Notes (Signed)
Physical Therapy Session Note  Patient Details  Name: Arthur Howard MRN: [id number] Date of Birth: January 04, 1966  Today's Date: 05/14/2018 PT Individual Time: 1000-1056   56 min   Short Term Goals: Week 1:  PT Short Term Goal 1 (Week 1): Pt will ambulate 75 ft with LRAD & min assist.  PT Short Term Goal 2 (Week 1): Pt negotiates single step without rails with LRAD & min assist.  PT Short Term Goal 3 (Week 1): Pt will complete bed<>w/c transfers with CGA.  Skilled Therapeutic Interventions/Progress Updates:   Pt received sitting on mat table following OT treatment and agreeable to PT.   Gait training with RW and RAFO x 11f, 1231fand 13016fMin assist overall from PT with tactile cues for improved timing of HS activation to prevent GR.   Nustep reciprocal movement traning BUE and BLE x 6 min BLE only x 4 min min-mod cues for improved hip control to prevent excessive ER and IR with LE only and for improved trunk rotation to the R with BUE/BLE.   Standing NMR.  Foot tap on 2 targets 2 x 8 BLE  Reciprocal foot taps on 4 inch step x 8 BLE and x and 10 BLE  Lateral foot taps on 4 inch step 2 x 10 BLE  Dynamic balance training while standing on aires pad.  Min- mod assist from PT for stabilization of trunk to prevent compensations and force increased hip and knee activation.   Pt returned to room and performed ambulatory transfer to bed with RW and min assist. Sit>supine transfer with min assist for RLE management assist and min cues for safety.  pt left supine in bed with call bell in reach and all needs met.        Therapy Documentation Precautions:  Precautions Precautions: Fall Precaution Comments: right hemiparesis Restrictions Weight Bearing Restrictions: No Pain:   denies    Therapy/Group: Individual Therapy  AusLorie Phenix21/2020, 10:38 AM

## 2018-05-14 NOTE — Progress Notes (Signed)
Social Work Patient ID: Arthur Howard, male   DOB: February 18, 1966, 53 y.o.   MRN: [id number]   CSW met with pt and his family 05-12-18 to update them on team conference discussion and expected LOS of 10 to 14 days.  Family members needed FMLA paperwork completed and PA/CSW made sure this was done.  Family very appreciative.  Pt has great family support.  CSW will continue to follow and assist as needed.

## 2018-05-14 NOTE — Progress Notes (Signed)
[facility] Individual Statement of Services  Patient Name:  Arthur Howard  Date:  05/14/2018  Welcome to the [facility].  Our goal is to provide you with an individualized program based on your diagnosis and situation, designed to meet your specific needs.  With this comprehensive rehabilitation program, you will be expected to participate in at least 3 hours of rehabilitation therapies Monday-Friday, with modified therapy programming on the weekends.  Your rehabilitation program will include the following services:  Physical Therapy (PT), Occupational Therapy (OT), 24 hour per day rehabilitation nursing, Case Management (Social Worker), Rehabilitation Medicine, Nutrition Services and Pharmacy Services  Weekly team conferences will be held on Wednesdays to discuss your progress.  Your Social Worker will talk with you frequently to get your input and to update you on team discussions.  Team conferences with you and your family in attendance may also be held.  Expected length of stay: 10 to 14 days  Overall anticipated outcome:  Supervision  Depending on your progress and recovery, your program may change. Your Social Worker will coordinate services and will keep you informed of any changes. Your Social Worker's name and contact numbers are listed  below.  The following services may also be recommended but are not provided by the [facility] will be made to provide these services after discharge if needed.  Arrangements include referral to agencies that provide these services.  Your insurance has been verified to be:  [place] Your primary doctor is:  [facility]   Pertinent information will be shared with your doctor and your insurance company.  Social Worker:  Alfonse Alpers, LCSW   [phone] or [phone]  Information discussed with and copy given to patient by: Trey Sailors, 05/14/2018, 2:34 PM

## 2018-05-15 LAB — GLUCOSE, CAPILLARY
GLUCOSE-CAPILLARY: 290 mg/dL — AB (ref 70–99)
Glucose-Capillary: 149 mg/dL — ABNORMAL HIGH (ref 70–99)
Glucose-Capillary: 276 mg/dL — ABNORMAL HIGH (ref 70–99)
Glucose-Capillary: 259 mg/dL — ABNORMAL HIGH (ref 70–99)

## 2018-05-15 NOTE — Progress Notes (Signed)
Arthur Howard is a 53 y.o. male who is admitted for CIR with right-sided weakness and dysarthria secondary to left pontine infarct  Past Medical History:  Diagnosis Date  . Diabetes mellitus without complication ([place])   . Hyperlipidemia      Subjective: No new complaints. No new problems. Slept well.  Daughter present at bedside to assist with translation  Objective: Vital signs in last 24 hours: Temp:  [97.5 F (36.4 C)-97.9 F (36.6 C)] 97.5 F (36.4 C) (02/22 0357) Pulse Rate:  [50-65] 50 (02/22 0357) Resp:  [18-20] 18 (02/22 0357) BP: (111-122)/(69-78) 111/69 (02/22 0357) SpO2:  [96 %-99 %] 99 % (02/22 0357) Weight change:  Last BM Date: 05/15/18  Intake/Output from previous day: 02/21 0701 - 02/22 0700 In: 580 [P.O.:580] Out: -    Patient Vitals for the past 24 hrs:  BP Temp Temp src Pulse Resp SpO2  05/15/18 0357 111/69 (!) 97.5 F (36.4 C) Oral (!) 50 18 99 %  05/14/18 1922 122/78 97.9 F (36.6 C) - 63 18 96 %  05/14/18 1424 119/71 (!) 97.5 F (36.4 C) - 65 20 98 %    Last cbgs: CBG (last 3)  Recent Labs    05/14/18 1630 05/14/18 2324 05/15/18 0610  GLUCAP 189* 116* 149*     Physical Exam General: No apparent distress   HEENT: not dry Lungs: Normal effort. Lungs clear to auscultation, no crackles or wheezes. Cardiovascular: Regular rate and rhythm, no edema Abdomen: S/NT/ND; BS(+) Musculoskeletal:  unchanged Neurological: No new neurological deficits with right hemiparesis Wounds: N/A    Skin: clear no rash Mental state: Alert, oriented, cooperative    Lab Results: BMET    Component Value Date/Time   NA 136 05/12/2018 0523   NA 139 05/30/2017 1157   K 4.0 05/12/2018 0523   CL 101 05/12/2018 0523   CO2 24 05/12/2018 0523   GLUCOSE 147 (H) 05/12/2018 0523   BUN 15 05/12/2018 0523   BUN 13 05/30/2017 1157   CREATININE 0.96 05/12/2018 0523   CREATININE 0.88 11/10/2015 0846   CALCIUM 9.0 05/12/2018 0523   GFRNONAA >60 05/12/2018 0523    GFRAA >60 05/12/2018 0523   CBC    Component Value Date/Time   WBC 8.7 05/12/2018 0523   RBC 5.22 05/12/2018 0523   HGB 15.7 05/12/2018 0523   HGB 16.2 09/20/2016 0926   HCT 46.1 05/12/2018 0523   HCT 47.5 09/20/2016 0926   PLT 328 05/12/2018 0523   PLT 328 09/20/2016 0926   MCV 88.3 05/12/2018 0523   MCV 92 09/20/2016 0926   MCH 30.1 05/12/2018 0523   MCHC 34.1 05/12/2018 0523   RDW 12.1 05/12/2018 0523   RDW 12.9 09/20/2016 0926   LYMPHSABS 2.7 05/12/2018 0523   LYMPHSABS 2.1 09/20/2016 0926   MONOABS 0.7 05/12/2018 0523   EOSABS 0.1 05/12/2018 0523   EOSABS 0.0 09/20/2016 0926   BASOSABS 0.0 05/12/2018 0523   BASOSABS 0.0 09/20/2016 0926     Medications: I have reviewed the patient's current medications.  Assessment/Plan:  Functional deficits following left pontine infarct with right hemiparesis.  Continue CIR DVT prophylaxis.  Continue Lovenox Diabetes mellitus.  Continue present regimen and continue to monitor Dyslipidemia.  Continue atorvastatin   Length of stay, days: 4  Marletta Lor , MD 05/15/2018, 11:08 AM

## 2018-05-16 ENCOUNTER — Inpatient Hospital Stay (HOSPITAL_COMMUNITY): Payer: Self-pay

## 2018-05-16 ENCOUNTER — Inpatient Hospital Stay (HOSPITAL_COMMUNITY): Payer: Self-pay | Admitting: Physical Therapy

## 2018-05-16 LAB — GLUCOSE, CAPILLARY
GLUCOSE-CAPILLARY: 256 mg/dL — AB (ref 70–99)
Glucose-Capillary: 169 mg/dL — ABNORMAL HIGH (ref 70–99)
Glucose-Capillary: 227 mg/dL — ABNORMAL HIGH (ref 70–99)
Glucose-Capillary: 117 mg/dL — ABNORMAL HIGH (ref 70–99)

## 2018-05-16 NOTE — Progress Notes (Signed)
Physical Therapy Session Note  Patient Details  Name: Arthur Howard MRN: [id number] Date of Birth: April 26, 1965  Today's Date: 05/16/2018 PT Individual Time: 6295-2841 PT Individual Time Calculation (min): 42 min   Short Term Goals: Week 1:  PT Short Term Goal 1 (Week 1): Pt will ambulate 75 ft with LRAD & min assist.  PT Short Term Goal 2 (Week 1): Pt negotiates single step without rails with LRAD & min assist.  PT Short Term Goal 3 (Week 1): Pt will complete bed<>w/c transfers with CGA.  Skilled Therapeutic Interventions/Progress Updates:  Pt was seen bedside in the pm. Pt performed sit to stand transfers with S and stand pivot transfers with c/g. Pt ambulated 150 feet and 100 feet x 2 with R AFO and c/g. Pt ascended/descended 12 stairs with 1 to 2 rails and c/g with verbal cues. Pt ambulated backwards 50 feet x 3 with min A and verbal cues. Pt working on dynamic standing balance, standing and kicking a soccer ball with either foot for 60 seconds x 2 with c/g to min A and verbal cues. Pt rode Nu-step x 10 minutes, 2 minutes at level 4 and 8 minutes at level 5. Pt returned to room following treatment and left sitting up on edge of bed with family at bedside.   Therapy Documentation Precautions:  Precautions Precautions: Fall Precaution Comments: right hemiparesis Restrictions Weight Bearing Restrictions: No General:   Pain: No c/o pain.    Therapy/Group: Individual Therapy  Dub Amis 05/16/2018, 3:29 PM

## 2018-05-16 NOTE — Progress Notes (Signed)
Physical Therapy Session Note  Patient Details  Name: Arthur Howard MRN: [id number] Date of Birth: 04-30-1965  Today's Date: 05/16/2018 PT Individual Time: 0903-0958 PT Individual Time Calculation (min): 55 min   Short Term Goals: Week 1:  PT Short Term Goal 1 (Week 1): Pt will ambulate 75 ft with LRAD & min assist.  PT Short Term Goal 2 (Week 1): Pt negotiates single step without rails with LRAD & min assist.  PT Short Term Goal 3 (Week 1): Pt will complete bed<>w/c transfers with CGA.  Skilled Therapeutic Interventions/Progress Updates:  Pt was seen bedside in the am with interpreter present to assist with treatment. Pt transferred supine to edge of bed with S. Pt transferred sit to stand with c/g. Pt ambulated 150 feet without AFO and min A with verbal cues. Donned R AFO in gym. Pt performed multiple sit to stand and stand pivot transfers with c/g and verbal cues. Pt ambulated 100 and 150 feet with R AFO and c/g with verbal cues. Pt performed coen taps and alternating cone taps 3 sets x 10 reps each for NMR. Pt performed step taps on 8" stair 3 sets x 10 reps for R LE to focus on hip flex. Pt ambulated 3 x 50 feet through cone slalom course with c/g and 3 x 50 feet side stepping. Pt ambulated back to room with c/g and verbal cues. Pt left sitting on edge of bed with family at bedside at end of treatment.   Therapy Documentation Precautions:  Precautions Precautions: Fall Precaution Comments: right hemiparesis Restrictions Weight Bearing Restrictions: No General:   Pain: No c/o pain.  Therapy/Group: Individual Therapy  Dub Amis 05/16/2018, 12:21 PM

## 2018-05-16 NOTE — Progress Notes (Signed)
Occupational Therapy Session Note  Patient Details  Name: Arthur Howard MRN: [id number] Date of Birth: 18-May-1965  Today's Date: 05/16/2018 OT Individual Time: 1000-1100 OT Individual Time Calculation (min): 60 min    Short Term Goals: Week 1:  OT Short Term Goal 1 (Week 1): Pt will complete bathing at sit > stand level with min assist OT Short Term Goal 2 (Week 1): Pt will complete LB dressing to include donning socks/shoes with min assist OT Short Term Goal 3 (Week 1): Pt will complete toilet transfers with min assist with LRAD OT Short Term Goal 4 (Week 1): Pt will utilize RUE as gross assist during self-care tasks with min assist and cues  Skilled Therapeutic Interventions/Progress Updates:    OT intervention with focus on functional amb without AD, sitting balance, RUE NMR (see below), and RUE NMES. Pt engaged in weight bearing while standing at wall and completing wall "push ups." Pt with trace/weak RUE grasp/release and active shoulder to 90 degrees with compensatory techniques.  1:1 NMES applied to RUE wrist/finger flexors/extensors to facilitate increase functional grasp; alternating between flexion/extension  Flexion and Extension Ratio 1:3 Rate 35 pps Waveform- Asymmetric Ramp 1.0 Pulse 300 Intensity- 23 Duration - 12     Pt able to replicate weak/trace wrist flexion/extension and finger flexion/extension  No adverse reactions after treatment and is skin intact.   Pt returned to room and remained in bed with all needs within reach and bed alarm activated.  Family present.   Therapy Documentation Precautions:  Precautions Precautions: Fall Precaution Comments: right hemiparesis Restrictions Weight Bearing Restrictions: No  Pain:  Pt denies pain this morning.   Other Treatments: Treatments Neuromuscular Facilitation: Right;Upper Extremity;Forced use;Activity to increase coordination;Activity to increase motor control;Activity to increase timing and  sequencing;Activity to increase grading;Activity to increase anterior-posterior weight shifting;Activity to increase sustained activation Weight Bearing Technique Weight Bearing Technique: Yes RUE Weight Bearing Technique: Extended arm seated;Forearm seated;Extended arm standing   Therapy/Group: Individual Therapy  Leroy Libman 05/16/2018, 12:17 PM

## 2018-05-16 NOTE — Progress Notes (Signed)
Occupational Therapy Session Note  Patient Details  Name: Arthur Howard MRN: [id number] Date of Birth: 1965-08-22  Today's Date: 05/16/2018 OT Individual Time: 1300-1330 OT Individual Time Calculation (min): 30 min    Short Term Goals: Week 1:  OT Short Term Goal 1 (Week 1): Pt will complete bathing at sit > stand level with min assist OT Short Term Goal 2 (Week 1): Pt will complete LB dressing to include donning socks/shoes with min assist OT Short Term Goal 3 (Week 1): Pt will complete toilet transfers with min assist with LRAD OT Short Term Goal 4 (Week 1): Pt will utilize RUE as gross assist during self-care tasks with min assist and cues  Skilled Therapeutic Interventions/Progress Updates:    OT intervention with focus on functional amb without AD and RUE activities/AROM tasks supine and sitting EOM.  Pt amb without AD to gym and laid on mat for variety of AROM activities with focus on shoulder flexion, elbow flexion/extension, and horizontal adduction.  Pt sat EOM for same tasks with AAROM. Focus on quality of movement.  Kinesion tape apploied to R shoulder/upper arm for support and pain management. Pt returned to room with focus on natural arm swing during ambulation.  Pt returned to bed and remained in bed with all needs within reach and family present.   Therapy Documentation Precautions:  Precautions Precautions: Fall Precaution Comments: right hemiparesis Restrictions Weight Bearing Restrictions: No  Pain:  Pt c/o R upper arm pain with activity; Kinesio Tape applied for support and pain ADL: ADL Grooming: Minimal assistance Where Assessed-Grooming: Standing at sink Upper Body Bathing: Moderate assistance Where Assessed-Upper Body Bathing: Shower Lower Body Bathing: Maximal assistance Where Assessed-Lower Body Bathing: Shower Upper Body Dressing: Moderate assistance Where Assessed-Upper Body Dressing: Chair Lower Body Dressing: Maximal assistance Where Assessed-Lower  Body Dressing: Chair Walk-In Shower Transfer: Moderate assistance Social research officer, government Method: Heritage manager: Radio broadcast assistant, Grab bars   Therapy/Group: Individual Therapy  Leroy Libman 05/16/2018, 2:44 PM

## 2018-05-16 NOTE — Progress Notes (Signed)
Arthur Howard is a 53 y.o. male admitted for CIR with right-sided weakness and dysarthria secondary to left pontine infarction  Past Medical History:  Diagnosis Date  . Diabetes mellitus without complication ([place])   . Hyperlipidemia      Subjective: No new complaints. No new problems.  Very pleased with his progress.  States that intermittently he is able to flex and extend the fingers of his right hand  Objective: Vital signs in last 24 hours: Temp:  [97.6 F (36.4 C)-98 F (36.7 C)] 97.6 F (36.4 C) (02/23 0405) Pulse Rate:  [48-62] 48 (02/23 0405) Resp:  [17-18] 17 (02/23 0405) BP: (119-133)/(66-88) 119/88 (02/23 0405) SpO2:  [95 %-97 %] 97 % (02/23 0405) Weight change:  Last BM Date: 05/15/18  Intake/Output from previous day: 02/22 0701 - 02/23 0700 In: 340 [P.O.:240] Out: -    Patient Vitals for the past 24 hrs:  BP Temp Pulse Resp SpO2  05/16/18 0405 119/88 97.6 F (36.4 C) (!) 48 17 97 %  05/15/18 1939 124/75 97.7 F (36.5 C) 62 18 95 %  05/15/18 1550 133/66 98 F (36.7 C) 60 18 96 %    Last cbgs: CBG (last 3)  Recent Labs    05/15/18 1653 05/15/18 2116 05/16/18 0628  GLUCAP 290* 259* 169*     Physical Exam General: No apparent distress   HEENT: Clear Lungs: Normal effort. Lungs clear to auscultation, no crackles or wheezes. Cardiovascular: Regular rate and rhythm, no edema Abdomen: S/NT/ND; BS(+) Musculoskeletal:  unchanged Neurological: No new neurological deficits with right hemiparesis Wounds: N/A    Skin: clear  Mental state: Alert, oriented, cooperative    Lab Results: BMET    Component Value Date/Time   NA 136 05/12/2018 0523   NA 139 05/30/2017 1157   K 4.0 05/12/2018 0523   CL 101 05/12/2018 0523   CO2 24 05/12/2018 0523   GLUCOSE 147 (H) 05/12/2018 0523   BUN 15 05/12/2018 0523   BUN 13 05/30/2017 1157   CREATININE 0.96 05/12/2018 0523   CREATININE 0.88 11/10/2015 0846   CALCIUM 9.0 05/12/2018 0523   GFRNONAA >60  05/12/2018 0523   GFRAA >60 05/12/2018 0523   CBC    Component Value Date/Time   WBC 8.7 05/12/2018 0523   RBC 5.22 05/12/2018 0523   HGB 15.7 05/12/2018 0523   HGB 16.2 09/20/2016 0926   HCT 46.1 05/12/2018 0523   HCT 47.5 09/20/2016 0926   PLT 328 05/12/2018 0523   PLT 328 09/20/2016 0926   MCV 88.3 05/12/2018 0523   MCV 92 09/20/2016 0926   MCH 30.1 05/12/2018 0523   MCHC 34.1 05/12/2018 0523   RDW 12.1 05/12/2018 0523   RDW 12.9 09/20/2016 0926   LYMPHSABS 2.7 05/12/2018 0523   LYMPHSABS 2.1 09/20/2016 0926   MONOABS 0.7 05/12/2018 0523   EOSABS 0.1 05/12/2018 0523   EOSABS 0.0 09/20/2016 0926   BASOSABS 0.0 05/12/2018 0523   BASOSABS 0.0 09/20/2016 0926    Medications: I have reviewed the patient's current medications.  Assessment/Plan:  Functional deficits following left pontine infarction with right hemiparesis.  Continue CIR Type 2 diabetes.  Continue present regimen DVT prophylaxis continue Lovenox Dyslipidemia continue statin therapy    Length of stay, days: 5  Marletta Lor , MD 05/16/2018, 10:37 AM

## 2018-05-16 NOTE — Progress Notes (Signed)
Social Work Assessment and Plan  Patient Details  Name: Arthur Howard MRN: [id number] Date of Birth: 30-Mar-1965  Today's Date: 05/12/2018  Problem List:  Patient Active Problem List   Diagnosis Date Noted  . Left pontine cerebrovascular accident ([place]) 05/11/2018  . Tobacco abuse   . Diabetes mellitus type 2 in nonobese ([facility])   . Uncontrolled type 2 diabetes mellitus with hyperglycemia ([place])   . CVA (cerebral vascular accident) ([place]) 05/06/2018  . Diabetes mellitus without complication ([place])   . Hyperlipidemia 05/30/2017   Past Medical History:  Past Medical History:  Diagnosis Date  . Diabetes mellitus without complication ([place])   . Hyperlipidemia    Past Surgical History:  Past Surgical History:  Procedure Laterality Date  . ANKLE SURGERY     Social History:  reports that he has been smoking. He has never used smokeless tobacco. He reports that he does not drink alcohol or use drugs.  Family / Support Systems Marital Status: Married Patient Roles: Spouse, Parent, Other (Comment)(employee; grandfather) Spouse/Significant Other: Vontrell Pullman - wife - [phone] (for emergencies only; call dtrs first as they speak/understand English better) Children: Latoya Diskin - dtr - [phone]; Mallory Shirk - dtr - [phone]; Hudson Majkowski - dtr - [phone]; Jacelyn Grip - dtr - works for [employer]; (3 other dtrs) Other Supports: friends/extended family/church family Anticipated Caregiver: wife and dtrs - will take turns being with pt.  Wife is working. 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 Legal History/Current Legal Issues: none reported Guardian/Conservator: N/A - MD has determined that pt is capable of making his own decisions.    Abuse/Neglect Abuse/Neglect Assessment Can Be Completed: Yes Physical Abuse: Denies Verbal Abuse: Denies Sexual Abuse: Denies Exploitation of patient/patient's resources: Denies Self-Neglect: Denies  Emotional Status Pt's affect, behavior and adjustment status: Pt and family both report pt is in pretty good spirits and is motivated to work hard and to get better. Recent Psychosocial Issues: none reported Psychiatric History: none reported Substance Abuse History: none reported  Patient / Family Perceptions, Expectations & Goals Pt/Family understanding of illness & functional limitations: Pt/family report a good understanding of pt's condition and do not report any unanswered medical questions at this point. Premorbid pt/family roles/activities: Pt worked a lot prior to stroke.  He and his wife also attend church and spend time with family. Anticipated changes in roles/activities/participation: Pt would like to resume activities as he is able. Pt/family expectations/goals: Pt wants his arm to start working and he wants to get back to work.  Community Resources [employer]s: None Premorbid Home Care/DME Agencies: None Transportation available at discharge: family Resource referrals recommended: Neuropsychology, Support group (specify)  Discharge Planning Living Arrangements: Spouse/significant other, Children Support Systems: Spouse/significant other, Children, Other relatives, Friends/neighbors, Social worker community Type of Residence: Private residence Insurance Resources: Multimedia programmer (specify)([employer]) Financial Resources: Employment Financial Screen Referred: No Money Management: Patient, Spouse Does the patient have any problems obtaining your medications?: No Home Management: Pt's family can  take care of home management while he recovers from stroke. Patient/Family Preliminary Plans: Pt to go to his home with his wife and dtrs to work out a  schedule where someone will be with him 24/7. Social Work Anticipated Follow Up Needs: HH/OP, Support Group Expected length of stay: 10 to 14 days  Clinical Impression CSW met with pt, pt's wife, and pt's dtr Verdis Frederickson) to introduce self and role of CSW, as well as to complete assessment.  Pt and family were very pleased that pt was able to come to CIR and that he is already showing progress.  CSW told wife/dtr that we can assist pt/pt's family with FMLA paperwork.  They were grateful.  Pt has great support and will have someone with him 24/7 at home.  CSW apologized for pt not having an interpreter for evaluation day and told them that it will arranged going forward.  They were appreciative.  No current concern, questions, needs at this time.  CSW will continue to follow and assist as needed.  Katryna Tschirhart, Silvestre Mesi 05/14/2018, 11:29 PM

## 2018-05-17 ENCOUNTER — Inpatient Hospital Stay (HOSPITAL_COMMUNITY): Payer: Self-pay | Admitting: Physical Therapy

## 2018-05-17 ENCOUNTER — Inpatient Hospital Stay (HOSPITAL_COMMUNITY): Payer: Self-pay

## 2018-05-17 ENCOUNTER — Inpatient Hospital Stay (HOSPITAL_COMMUNITY): Payer: Self-pay | Admitting: Occupational Therapy

## 2018-05-17 LAB — GLUCOSE, CAPILLARY
GLUCOSE-CAPILLARY: 223 mg/dL — AB (ref 70–99)
GLUCOSE-CAPILLARY: 139 mg/dL — AB (ref 70–99)
Glucose-Capillary: 182 mg/dL — ABNORMAL HIGH (ref 70–99)
Glucose-Capillary: 140 mg/dL — ABNORMAL HIGH (ref 70–99)

## 2018-05-17 NOTE — Progress Notes (Signed)
Physical Therapy Session Note  Patient Details  Name: Arthur Howard MRN: [id number] Date of Birth: December 20, 1965  Today's Date: 05/17/2018 PT Individual Time: 6004-5997 PT Individual Time Calculation (min): 69 min   Short Term Goals: Week 1:  PT Short Term Goal 1 (Week 1): Pt will ambulate 75 ft with LRAD & min assist.  PT Short Term Goal 2 (Week 1): Pt negotiates single step without rails with LRAD & min assist.  PT Short Term Goal 3 (Week 1): Pt will complete bed<>w/c transfers with CGA.  Skilled Therapeutic Interventions/Progress Updates:  Pt received in bed with family present for session. Pt denies c/o pain. Pt ambulates around unit without AD, R AFO & min assist. [name] Insurance account manager) present at beginning of session to assess pt's gait with and without AFO. with recommendations to try a stiffer AFO tomorrow vs more flexible AFO & heel wedge on this date. Pt ambulates 200 ft with SPC with light min<>CGA with cuing to decrease gait speed. Pt performed side stepping & retrograde gait (with and without UE support) with task focusing on weight shifting R, dynamic balance, R foot clearance, and R NMR. Pt performed R lateral step ups on 3" step with RUE>no UE support and min assist with task focusing on RLE hip/knee flexion and hip abduction, RLE NMR & strengthening, and dynamic balance. Pt stood on compliant surface while holding 2 kg weighted ball and moving it in PNF patterns with task focusing on standing balance, RUE strengthening & NMR. Pt carried weighted ball to return to stand with min assist for balance. Pt utilized nu-step on level 3 x 10 minutes with BLE & RUE with RUE support to maintain grasp on handle with task focusing on global strengthening & endurance, R NMR. At end of session pt left sitting on EOB with bed alarm set & family present to supervise.   Therapy Documentation Precautions:  Precautions Precautions: Fall Precaution Comments: right hemiparesis Restrictions Weight  Bearing Restrictions: No    Therapy/Group: Individual Therapy  LAWTON DOLLINGER 05/17/2018, 2:16 PM

## 2018-05-17 NOTE — Progress Notes (Signed)
Orthopedic Tech Progress Note Patient Details:  Arthur Howard 1965-06-30 [id number] Placed order with [facility] Patient ID: Arthur Howard, male   DOB: [birth year], 53 y.o.   MRN: [id number]   Arthur Howard 05/17/2018, 11:40 AM

## 2018-05-17 NOTE — Progress Notes (Signed)
Occupational Therapy Session Note  Patient Details  Name: Arthur Howard MRN: [id number] Date of Birth: 12-13-1965  Today's Date: 05/17/2018 OT Individual Time: 1005-1103 OT Individual Time Calculation (min): 58 min    Short Term Goals: Week 1:  OT Short Term Goal 1 (Week 1): Pt will complete bathing at sit > stand level with min assist OT Short Term Goal 2 (Week 1): Pt will complete LB dressing to include donning socks/shoes with min assist OT Short Term Goal 3 (Week 1): Pt will complete toilet transfers with min assist with LRAD OT Short Term Goal 4 (Week 1): Pt will utilize RUE as gross assist during self-care tasks with min assist and cues  Skilled Therapeutic Interventions/Progress Updates:    Treatment session with focus on RUE NMR.  Pt declined bathing/dressing this session.  Ambulated to therapy gym without AD with min assist to CGA with Rt AFO.  Engaged in [place] in sitting with focus on maintaining proper alignment and shoulder positioning during PNF pattern reaching with 2# medicine ball.  Therapist provided tactile cues to Rt shoulder to minimize shoulder hike and compensatory movements.  Engaged in reaching and grasping with RUE, as pt demonstrating improved grasp and release in Rt hand.  Therapist continued to provide tactile cues at shoulder as well as facilitation during reaching and cues for finger extension/flexion during open hand movement.  Pt demonstrating difficulty with shoulder rolls on Rt, therapist provided facilitation and mobilizations and then returned to further reaching task. Educated on mirror therapy for motor relearning and NMR with pt willing to attempt.  Engaged in [place] therapy with RUE in mirror box while engaging in grasp/release, pronation/supination, and thumb opposition.  Pt with good carryover with mirror removed.  Ambulated back to room while holding ball in BUE to increase attention to RUE during mobility.    Pt's daughter and hospital interpreter  present and providing interpretation throughout.  Therapy Documentation Precautions:  Precautions Precautions: Fall Precaution Comments: right hemiparesis Restrictions Weight Bearing Restrictions: No Pain: Pain Assessment Pain Scale: 0-10 Pain Score: 0-No pain   Therapy/Group: Individual Therapy  Simonne Come 05/17/2018, 1:09 PM

## 2018-05-17 NOTE — Progress Notes (Signed)
Physical Therapy Session Note  Patient Details  Name: Arthur Howard MRN: [id number] Date of Birth: Jul 26, 1965  Today's Date: 05/17/2018 PT Individual Time: 0900-0958 PT Individual Time Calculation (min): 58 min   Short Term Goals: Week 1:  PT Short Term Goal 1 (Week 1): Pt will ambulate 75 ft with LRAD & min assist.  PT Short Term Goal 2 (Week 1): Pt negotiates single step without rails with LRAD & min assist.  PT Short Term Goal 3 (Week 1): Pt will complete bed<>w/c transfers with CGA.  Skilled Therapeutic Interventions/Progress Updates:    Pt seated EOB upon PT arrival, agreeable to therapy tx and denies pain. Pt donned R AFO and shoe with min assist. Pt ambulated x 200 ft without AD to the ortho gym, CGA. Pt participated in gait training this session using the body weight support treadmill training. Gait training as follows: Forwards ambulation x5 minutes, 1.2 mph, 542 ft with verbal cues for increased L step length and verbal cues for R heel strike, tactile cues for facilitation of R arm swing. Backwards ambulation x 4:17 min, 0.5 mph, 174 ft with verbal cues for increased L step length backwards and manual facilitation for R knee flexion and foot placement. Sidestepping to the R x 4 minutes, 0.4- 0.5 mph, 159 ft with verbal cues for techniques and step length Sidestepping to the L x4 minutes, 0.5 mph, 170 ft with verbal cues for techniques and step length Forwards ambulation x 6 minutes 1.2-1.4 mph, 667 ft with verbal cues for R foot clearance and head/chest looking up   Noted hamstring weakness contributing to decreased R foot clearance during all ambulation. Pt transferred to mat with CGA and instructed in heel slide exercise using towel under foot for hamstring strengthening and neuro re-ed. Pt performed x 10 with cues for techniques. Pt performed standing R hamstring curl but only able to raise about 15 degrees of knee flexion. Pt ambulated back to room and left seated EOB in care of  family.   Therapy Documentation Precautions:  Precautions Precautions: Fall Precaution Comments: right hemiparesis Restrictions Weight Bearing Restrictions: No    Therapy/Group: Individual Therapy  Netta Corrigan, PT, DPT 05/17/2018, 7:59 AM

## 2018-05-18 ENCOUNTER — Inpatient Hospital Stay (HOSPITAL_COMMUNITY): Payer: Self-pay | Admitting: Occupational Therapy

## 2018-05-18 ENCOUNTER — Inpatient Hospital Stay (HOSPITAL_COMMUNITY): Payer: Self-pay | Admitting: Physical Therapy

## 2018-05-18 LAB — CREATININE, SERUM
Creatinine, Ser: 0.89 mg/dL (ref 0.61–1.24)
GFR calc Af Amer: >60 mL/min (ref >60)
GFR calc non Af Amer: >60 mL/min (ref >60)

## 2018-05-18 LAB — GLUCOSE, CAPILLARY
Glucose-Capillary: 150 mg/dL — ABNORMAL HIGH (ref 70–99)
Glucose-Capillary: 266 mg/dL — ABNORMAL HIGH (ref 70–99)
Glucose-Capillary: 165 mg/dL — ABNORMAL HIGH (ref 70–99)

## 2018-05-18 NOTE — Progress Notes (Addendum)
Physical Therapy Session Note  Patient Details  Name: Arthur Howard MRN: [id number] Date of Birth: [birth year]  Today's Date: 05/18/2018 PT Individual Time: 1036-1101 and 9735-3299 PT Individual Time Calculation (min): 25 min and 70 min  Short Term Goals: Week 1:  PT Short Term Goal 1 (Week 1): Pt will ambulate 75 ft with LRAD & min assist.  PT Short Term Goal 2 (Week 1): Pt negotiates single step without rails with LRAD & min assist.  PT Short Term Goal 3 (Week 1): Pt will complete bed<>w/c transfers with CGA.  Skilled Therapeutic Interventions/Progress Updates:  Treatment 1: Pt received sitting on bed with family present to observe session. No c/o pain. Educated pt & family on estimated d/c date. Pt ambulates room>dayroom>gym>room with SPC & R AFO & heel wedge with close supervision with increased weight shifting L. Pt utilized [employer] Limits of Stability & Weight bearing percentage to focus on ankle strategies and weight shifting, and equal weight bearing on R/L LE and minimize A/P lean. Pt requires assistance for weight shifting anterior and R. In gym with pt supine on mat table therapist performed B hamstring stretches with instructions for stretching regimen, and pt exhibiting slightly more hamstring tightness in RLE compared to LLE. At end of session pt left sitting on EOB with family present to supervise.   Treatment 2: Pt received in room & agreeable to tx, denying c/o pain. Pt ambulates throughout unit with [facility] & R AFO with close supervision with increased stance time LLE. Pt completed Berg Balance Test & scored [id number]; educated pt on interpretation of score & current fall risk. Patient demonstrates increased fall risk as noted by score of 46/56 on Berg Balance Scale.  (<36= high risk for falls, close to 100%; 37-45 significant >80%; 46-51 moderate >50%; 52-55 lower >25%). [name] Insurance account manager) arrived and pt trialed current shoes with firmer, smaller wedge, and more firm shoes with and  without wedge with [name] reporting this is the optimal suggestion - pt using firm shoes with current flexible AFO or AFO & heel wedge with shoes with air bubble in the heel. Pt agreeable to this and to wearing firmer shoes. [name] will return with pt's AFO tomorrow. Utilized e-stim with NMES 10 second on/10 second off, up to 47, with electrodes on RLE anterior tib with contraction observed and education for pt to attempt to activate dorsiflexors during contraction. Therapist facilitated full ROM dorsiflexion (PROM) & provided tactile cuing, attempted to have pt hold foot in dorsiflexed position even after e-stim cycle ended; pt unable to perform RLE there ex 2/2 weakness & impaired neuromuscular control. Pt completed floor transfer with supervision. Pt ambulates back to room without AD (R AFO only) and CGA. Educated pt & family on recommendation of SPC at least for short period of time and especially when ambulating outside over uneven surfaces. Educated pt & family on grounds pass. Pt left sitting on EOB with family present to supervise.    Therapy Documentation Precautions:  Precautions Precautions: Fall Precaution Comments: right hemiparesis Restrictions Weight Bearing Restrictions: No   Balance: Balance Balance Assessed: Yes Standardized Balance Assessment Standardized Balance Assessment: Berg Balance Test Berg Balance Test Sit to Stand: Able to stand without using hands and stabilize independently Standing Unsupported: Able to stand safely 2 minutes Sitting with Back Unsupported but Feet Supported on Floor or Stool: Able to sit safely and securely 2 minutes Stand to Sit: Sits safely with minimal use of hands Transfers: Able to transfer safely, minor use of hands  Standing Unsupported with Eyes Closed: Able to stand 10 seconds safely Standing Ubsupported with Feet Together: Able to place feet together independently and stand 1 minute safely From Standing, Reach Forward with Outstretched  Arm: Can reach confidently >25 cm (10") From Standing Position, Pick up Object from Floor: Able to pick up shoe, needs supervision From Standing Position, Turn to Look Behind Over each Shoulder: Turn sideways only but maintains balance Turn 360 Degrees: Needs close supervision or verbal cueing Standing Unsupported, Alternately Place Feet on Step/Stool: Able to complete >2 steps/needs minimal assist Standing Unsupported, One Foot in Front: Able to place foot tandem independently and hold 30 seconds(elects LLE in back, RLE in front) Standing on One Leg: Able to lift leg independently and hold 5-10 seconds(elects to stand on LLE) Total Score: 46     Therapy/Group: Individual Therapy  CORDERA STINEMAN 05/18/2018, 2:34 PM

## 2018-05-18 NOTE — Progress Notes (Signed)
PHYSICAL MEDICINE & REHABILITATION PROGRESS NOTE   Subjective/Complaints:   No issues overnight, had shower with help of his wife.  ROS- no CP, SOB, + dizziness, no abd pain   Objective:   No results found. No results for input(s): WBC, HGB, HCT, PLT in the last 72 hours. Recent Labs    05/18/18 0657  CREATININE 0.89    Intake/Output Summary (Last 24 hours) at 05/18/2018 1625 Last data filed at 05/18/2018 0700 Gross per 24 hour  Intake 240 ml  Output -  Net 240 ml     Physical Exam: Vital Signs Blood pressure (!) 133/59, pulse 60, temperature 98.5 F (36.9 C), resp. rate 18, height 5\' 8"  (1.727 m), weight 67.9 kg, SpO2 95 %.  General: No acute distress Mood and affect are appropriate Heart: Regular rate and rhythm no rubs murmurs or extra sounds Lungs: Clear to auscultation, breathing unlabored, no rales or wheezes Abdomen: Positive bowel sounds, soft nontender to palpation, nondistended Extremities: No clubbing, cyanosis, or edema Skin: No evidence of breakdown, no evidence of rash Neurologic: Cranial nerves II through XII intact, motor strength is 5/5 in LEFT deltoid, bicep, tricep, grip, hip flexor, knee extensors, ankle dorsiflexor and plantar flexor 3- RIght triceps, 2- finger ext , tr in WF and FF, 2-/5 delt, + trap, 3- hip /knee ext, 0 at ankle Sensory exam normal sensation to light touch in bilateral upper and lower extremities  Musculoskeletal: Full range of motion in all 4 extremities. No joint swelling    Assessment/Plan: 1. Functional deficits secondary to RIght hemiparesis which require 3+ hours per day of interdisciplinary therapy in a comprehensive inpatient rehab setting.  Physiatrist is providing close team supervision and 24 hour management of active medical problems listed below.  Physiatrist and rehab team continue to assess barriers to discharge/monitor patient progress toward functional and medical goals  Care Tool:  Bathing     Body parts bathed by patient: Right arm, Chest, Abdomen, Front perineal area, Right upper leg, Left upper leg, Left lower leg, Face, Left arm, Buttocks, Right lower leg(used long sponge)   Body parts bathed by helper: Right lower leg, Buttocks, Left arm     Bathing assist Assist Level: Contact Guard/Touching assist     Upper Body Dressing/Undressing Upper body dressing   What is the patient wearing?: Pull over shirt    Upper body assist Assist Level: Minimal Assistance - Patient > 75%    Lower Body Dressing/Undressing Lower body dressing      What is the patient wearing?: Underwear/pull up, Pants     Lower body assist Assist for lower body dressing: Minimal Assistance - Patient > 75%     Toileting Toileting    Toileting assist Assist for toileting: Moderate Assistance - Patient 50 - 74%     Transfers Chair/bed transfer  Transfers assist     Chair/bed transfer assist level: Supervision/Verbal cueing     Locomotion Ambulation   Ambulation assist      Assist level: Supervision/Verbal cueing Assistive device: Cane-straight(R AFO) Max distance: 150 ft    Walk 10 feet activity   Assist     Assist level: Supervision/Verbal cueing Assistive device: Cane-straight(R AFO)   Walk 50 feet activity   Assist Walk 50 feet with 2 turns activity did not occur: Safety/medical concerns  Assist level: Supervision/Verbal cueing Assistive device: Cane-straight(R AFO)    Walk 150 feet activity   Assist Walk 150 feet activity did not occur: Safety/medical concerns  Assist level: Supervision/Verbal  cueing Assistive device: Cane-straight(R AFO)    Walk 10 feet on uneven surface  activity   Assist     Assist level: Moderate Assistance - Patient - 50 - 74%     Wheelchair     Assist Will patient use wheelchair at discharge?: No Type of Wheelchair: Manual    Wheelchair assist level: Supervision/Verbal cueing Max wheelchair distance: 100 ft      Wheelchair 50 feet with 2 turns activity    Assist        Assist Level: Supervision/Verbal cueing   Wheelchair 150 feet activity     Assist Wheelchair 150 feet activity did not occur: Safety/medical concerns        Medical Problem List and Plan: 1.Right side weakness with facial droop and dysarthriasecondary to 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 DVT Prophylaxis/Anticoagulation: Subcutaneous Lovenox. Monitor for any bleeding episodes 3. Pain Management:Tylenol as needed 4. Mood:Provide emotional support 5. Neuropsych: This patientiiscapable of making decisions on hisown behalf. 6. Skin/Wound Care:Routine skin checks 7. Fluids/Electrolytes/Nutrition:Routine in and out's with follow-up chemistries 8. Diabetes mellitus with peripheral neuropathy. Hemoglobin A1c 11.3. Glucotrol 5 mg twice a day, Glucophage d/ced due to nausea, no recurrence of nausea or vomiting or diarrhea -pt complains of nausea at times, need to consider metformin as source -Check blood sugars before meals and at bedtime. -Diabetic teaching CBG (last 3)  Recent Labs    05/17/18 2135 05/18/18 0624 05/18/18 1157  GLUCAP 139* 150* 266*  Controlled occasional lability will monitor for now 9. Permissive hypertension. Patient on no antihypertensive medications prior to [facility] with increased mobility . Vitals:   05/17/18 1952 05/18/18 0541  BP: 132/68 (!) 133/59  Pulse: 60 60  Resp: 18 18  Temp: 98 F (36.7 C) 98.5 F (36.9 C)  SpO2: 98% 95%  Controlled 2/25 no meds 10. Hyperlipidemia. Lipitor 11. Tobacco abuse. Counseling    LOS: 7 days A FACE TO FACE EVALUATION WAS PERFORMED  Charlett Blake 05/18/2018, 4:25 PM

## 2018-05-18 NOTE — Progress Notes (Signed)
Pt in bed appears comfortable. family present. Tylenol given for pain leg pain with relief

## 2018-05-18 NOTE — Progress Notes (Signed)
Occupational Therapy Session Note  Patient Details  Name: Arthur Howard MRN: [id number] Date of Birth: 1965/11/29  Today's Date: 05/18/2018 OT Individual Time: 0900-1026 OT Individual Time Calculation (min): 86 min    Short Term Goals: Week 1:  OT Short Term Goal 1 (Week 1): Pt will complete bathing at sit > stand level with min assist OT Short Term Goal 2 (Week 1): Pt will complete LB dressing to include donning socks/shoes with min assist OT Short Term Goal 3 (Week 1): Pt will complete toilet transfers with min assist with LRAD OT Short Term Goal 4 (Week 1): Pt will utilize RUE as gross assist during self-care tasks with min assist and cues  Skilled Therapeutic Interventions/Progress Updates:    Treatment session with focus on functional transfers and safety in home environment as well as RUE NMR.  Pt received seated EOB reporting already dressed and ready for therapy session.  Engaged in discussion of home bathroom setup and recommendation for seat for shower.  Pt ambulated to ADL apt with [facility] with supervision.  Engaged in walk-in shower transfers stepping over 3" ledge with SPC with min guard.  Discussed chair for energy conservation and safety with daughter stating they will order one on their own.  Engaged in walk-in shower transfers without AFO donned (as this will be typical at home).  Pt required min assist when stepping over ledge.  Discussed recommendation for CGA for transfers/ambulation when not wearing AFO and to only not wear AFO if ambulating to bathroom at night or to shower but otherwise wear AFO for all other ambulation - pt and pt's family express understanding.  Engaged in [place] in sitting with use of dowel rod in BUE with focus on symmetrical use while therapist provided tactile cues at shoulder to decrease compensatory movements.  Progressed to pronation/supination with RUE while holding dowel rod for increased feedback.  Engaged in internal/external rotation with dowel  rod with cues to increase shoulder movement and decrease trunk movement.  Progressed to pouring water with RUE with therapist providing tactile cues progressing to close supervision while pt managed cup in RUE.  Discussed functional carryover to increasing use of dominant RUE during self-care and feeding tasks.  Even utilizing RUE as gross assist with LUE support.  Applied e-stim to RUE to facilitate finger flexion/extension with alternating stimulation.  Level 20 for flexion and 24 for extension for 10 mins.  Pt with no c/o pain after stimulation.  Engaged in reaching for pegs with RUE with pt able to pick up large pegs with RUE with gross grasp and place them in cup, progressing to placing them in peg board.  Challenged pt to even attempt picking up pegs from table when they fell, with pt able to complete with increased time and effort.  Engaged in reaching with ball to facilitate improved "stretch" of elbow when reaching instead of relying on trunk mobility and compensatory shoulder hike.  Ambulated back to room with [facility] with supervision.  Pt's daughter and medical interpreter present throughout session providing interpretation.  Therapy Documentation Precautions:  Precautions Precautions: Fall Precaution Comments: right hemiparesis Restrictions Weight Bearing Restrictions: No Pain: Pain Assessment Pain Scale: 0-10 Pain Score: 2    Therapy/Group: Individual Therapy  Simonne Come 05/18/2018, 12:25 PM

## 2018-05-19 ENCOUNTER — Inpatient Hospital Stay (HOSPITAL_COMMUNITY): Payer: Self-pay | Admitting: Occupational Therapy

## 2018-05-19 ENCOUNTER — Inpatient Hospital Stay (HOSPITAL_COMMUNITY): Payer: Self-pay

## 2018-05-19 ENCOUNTER — Inpatient Hospital Stay (HOSPITAL_COMMUNITY): Payer: Self-pay | Admitting: Physical Therapy

## 2018-05-19 LAB — GLUCOSE, CAPILLARY
Glucose-Capillary: 146 mg/dL — ABNORMAL HIGH (ref 70–99)
Glucose-Capillary: 236 mg/dL — ABNORMAL HIGH (ref 70–99)
Glucose-Capillary: 169 mg/dL — ABNORMAL HIGH (ref 70–99)
Glucose-Capillary: 105 mg/dL — ABNORMAL HIGH (ref 70–99)

## 2018-05-19 MED ORDER — GLIPIZIDE 5 MG PO TABS
10.0000 mg | ORAL_TABLET | Freq: Two times a day (BID) | ORAL | Status: DC
  Administered 2018-05-19 – 2018-05-21 (×4): 10 mg via ORAL
  Filled 2018-05-19 (×4): qty 2

## 2018-05-19 NOTE — Patient Care Conference (Signed)
Inpatient RehabilitationTeam Conference and Plan of Care Update Date: 05/19/2018   Time: 10:30 AM    Patient Name: Arthur Howard      Medical Record Number: [id number]  Date of Birth: [birth year] Sex: Male         Room/Bed: 4W19C/4W19C-01 Payor Info: Payor: [employer] / Plan: C[employer] INDEMNITY / Product Type: *No Product type* /    Admitting Diagnosis: L CVA  Admit Date/Time:  05/11/2018  3:14 PM Admission Comments: No comment available   Primary Diagnosis:  <principal problem not specified> Principal Problem: <principal problem not specified>  Patient Active Problem List   Diagnosis Date Noted  . Left pontine cerebrovascular accident ([place]) 05/11/2018  . Tobacco abuse   . Diabetes mellitus type 2 in nonobese ([facility])   . Uncontrolled type 2 diabetes mellitus with hyperglycemia ([place])   . CVA (cerebral vascular accident) ([place]) 05/06/2018  . Diabetes mellitus without complication ([place])   . Hyperlipidemia 05/30/2017    Expected Discharge Date: Expected Discharge Date: 05/21/18  Team Members Present: Physician leading conference: Dr. Alysia Penna Social Worker Present: Alfonse Alpers, LCSW Nurse Present: Dorien Chihuahua, RN PT Present: Lavone Nian, PT OT Present: Simonne Come, OT PPS Coordinator present : Gunnar Fusi     Current Status/Progress Goal Weekly Team Focus  Medical   Diabetic control fair, no pain c/os, normal BPs  improve diabetic managemnt and education  D/C planning   Bowel/Bladder   continent of bowel and bladder LBM  2-26   remain continent of bowel and bladder, maintain regular bowel pattern  Assess bowel and bladder needs prn   Swallow/Nutrition/ Hydration             ADL's   CGA to supervision bathing/dressing, CGA transfers with [facility]  Supervision overall  ADL retraining, RUE NMR, pt/family education, d/c planning   Mobility   CGA<>close supervision gait with SPC, getting fitted for his own AFO today, NMR, strengthening, endurance, balance  supervision  overall except min assist floor transfer  transfers, gait, balance, endurance, stairs, R NMR, pt education & d/c planning   Communication             Safety/Cognition/ Behavioral Observations            Pain   no c/o pain  pain < 3  Assess pain q shift andf prn    Skin   no skin issues  no new skin issues  Assess skin q shift and prn    Rehab Goals Patient on target to meet rehab goals: Yes Rehab Goals Revised: none *See Care Plan and progress notes for long and short-term goals.     Barriers to Discharge  Current Status/Progress Possible Resolutions Date Resolved   Physician    Medical stability     progressing toward goals  Cont rehab      Nursing                  PT                    OT                  SLP                SW                Discharge Planning/Teaching Needs:  Pt plans to return to his home and family will take turns being with him to provide supervision.  Family always present  for therapy.  They are very supportive and work well with pt.   Team Discussion:  Pt's blood sugar is a little high and metformin had to be stopped due to nausea.  Dr. Letta Pate to increase his glucotrol.  Blood pressure is controlled.  He will need to outpt f/u with neurology and Dr. Letta Pate.  Pt's family does bathing and dressing and is at goal level.  He's gotten a lot of return in his UE and is very motivated to work hard in [place].  Therapists will give him home exercise program since outpt therapy will not start until 05-27-18.  Pt is supervision with cane and received AFO today.   Revisions to Treatment Plan:  none    Continued Need for Acute Rehabilitation Level of Care: The patient requires daily medical management by a physician with specialized training in physical medicine and rehabilitation for the following conditions: Daily direction of a multidisciplinary physical rehabilitation program to ensure safe treatment while eliciting the highest outcome that is of  practical value to the patient.: Yes Daily medical management of patient stability for increased activity during participation in an intensive rehabilitation regime.: Yes Daily analysis of laboratory values and/or radiology reports with any subsequent need for medication adjustment of medical intervention for : Neurological problems;Diabetes problems;Blood pressure problems   I attest that I was present, lead the team conference, and concur with the assessment and plan of the team.   Petr Bontempo, Silvestre Mesi 05/19/2018, 1:54 PM

## 2018-05-19 NOTE — Progress Notes (Signed)
Physical Therapy Session Note  Patient Details  Name: Arthur Howard MRN: [id number] Date of Birth: 03/23/1966  Today's Date: 05/19/2018 PT Individual Time: 8916-9450 PT Individual Time Calculation (min): 68 min   Short Term Goals: Week 1:  PT Short Term Goal 1 (Week 1): Pt will ambulate 75 ft with LRAD & min assist.  PT Short Term Goal 2 (Week 1): Pt negotiates single step without rails with LRAD & min assist.  PT Short Term Goal 3 (Week 1): Pt will complete bed<>w/c transfers with CGA.  Skilled Therapeutic Interventions/Progress Updates:  Pt received in room with family present to observe session. No c/o pain during session. Educated pt's family on their positioning in relation to pt when he is ambulating. Pt ambulates throughout unit with supervision, SPC, R AFO & heel wedge with occasional cuing to decrease gait speed to focus on RLE control to prevent recurvatum in RLE stance phase. Pt negotiates 12 steps (6") with [facility] & supervision with instructional cuing for compensatory pattern with therapist then with family providing supervision. Provided pt with HEP handout (OTAGO Level B) with pt return demonstrating all exercises with good safety awareness. Pt ambulates gym<>outside north tower with rest breaks PRN. Pt ambulates over uneven surface and up/down ramp with supervision. Back on unit, pt with 1/5 trace dorsiflexion in RLE. Utilized NMES e-stim up to 47 with electrodes placed on R anterior tib with therapist providing PROM during on phase of e-stim with pt attempting to hold dorsiflexion after on phase ends with pt able to do so. After 10 minutes of therapeutic exercise with e-stim assist pt was able to demonstrate slightly increased dorsiflexion in antigravity position, vs dependent position. Pt encouraged with increased movement. At end of session pt left sitting in w/c with family present to supervise.   No adverse symptoms or redness noted after e-stim utilized; pt with no c/o pain during  use of e-stim.  [name] conservator/restorer rep) was exiting pt's room upon PT arrival, as he just provided pt with his personal AFO & heel wedge.    Therapy Documentation Precautions:  Precautions Precautions: Fall Precaution Comments: right hemiparesis Restrictions Weight Bearing Restrictions: No   Therapy/Group: Individual Therapy  [name] 05/19/2018, 3:54 PM

## 2018-05-19 NOTE — Progress Notes (Signed)
[place] PHYSICAL MEDICINE & REHABILITATION PROGRESS NOTE   Subjective/Complaints:   Per OT, RIght hand function doing well  ROS- no CP, SOB, + dizziness, no abd pain   Objective:   No results found. No results for input(s): WBC, HGB, HCT, PLT in the last 72 hours. Recent Labs    05/18/18 0657  CREATININE 0.89    Intake/Output Summary (Last 24 hours) at 05/19/2018 1012 Last data filed at 05/19/2018 0838 Gross per 24 hour  Intake 480 ml  Output -  Net 480 ml     Physical Exam: Vital Signs Blood pressure 112/68, pulse 60, temperature 98 F (36.7 C), temperature source Oral, resp. rate 18, height '5\' 8"'  (1.727 m), weight 69 kg, SpO2 95 %.  General: No acute distress Mood and affect are appropriate Heart: Regular rate and rhythm no rubs murmurs or extra sounds Lungs: Clear to auscultation, breathing unlabored, no rales or wheezes Abdomen: Positive bowel sounds, soft nontender to palpation, nondistended Extremities: No clubbing, cyanosis, or edema Skin: No evidence of breakdown, no evidence of rash Neurologic: Cranial nerves II through XII intact, motor strength is 5/5 in LEFT deltoid, bicep, tricep, grip, hip flexor, knee extensors, ankle dorsiflexor and plantar flexor 3- RIght triceps, 2- finger ext , tr in WF and FF, 2-/5 delt, + trap, 3- hip /knee ext, 0 at ankle Sensory exam normal sensation to light touch in bilateral upper and lower extremities  Musculoskeletal: Full range of motion in all 4 extremities. No joint swelling    Assessment/Plan: 1. Functional deficits secondary to RIght hemiparesis which require 3+ hours per day of interdisciplinary therapy in a comprehensive inpatient rehab setting.  Physiatrist is providing close team supervision and 24 hour management of active medical problems listed below.  Physiatrist and rehab team continue to assess barriers to discharge/monitor patient progress toward functional and medical goals  Care Tool:  Bathing     Body parts bathed by patient: Right arm, Chest, Abdomen, Front perineal area, Right upper leg, Left upper leg, Left lower leg, Face, Left arm, Buttocks, Right lower leg(used long sponge)   Body parts bathed by helper: Right lower leg, Buttocks, Left arm     Bathing assist Assist Level: Contact Guard/Touching assist     Upper Body Dressing/Undressing Upper body dressing   What is the patient wearing?: Pull over shirt    Upper body assist Assist Level: Minimal Assistance - Patient > 75%    Lower Body Dressing/Undressing Lower body dressing      What is the patient wearing?: Underwear/pull up, Pants     Lower body assist Assist for lower body dressing: Minimal Assistance - Patient > 75%     Toileting Toileting    Toileting assist Assist for toileting: Moderate Assistance - Patient 50 - 74%     Transfers Chair/bed transfer  Transfers assist     Chair/bed transfer assist level: Supervision/Verbal cueing     Locomotion Ambulation   Ambulation assist      Assist level: Supervision/Verbal cueing Assistive device: Cane-straight(R AFO) Max distance: 150 ft    Walk 10 feet activity   Assist     Assist level: Supervision/Verbal cueing Assistive device: Cane-straight(R AFO)   Walk 50 feet activity   Assist Walk 50 feet with 2 turns activity did not occur: Safety/medical concerns  Assist level: Supervision/Verbal cueing Assistive device: Cane-straight(R AFO)    Walk 150 feet activity   Assist Walk 150 feet activity did not occur: Safety/medical concerns  Assist level: Supervision/Verbal cueing  Assistive device: Cane-straight(R AFO)    Walk 10 feet on uneven surface  activity   Assist     Assist level: Moderate Assistance - Patient - 50 - 74%     Wheelchair     Assist Will patient use wheelchair at discharge?: No Type of Wheelchair: Manual    Wheelchair assist level: Supervision/Verbal cueing Max wheelchair distance: 100 ft      Wheelchair 50 feet with 2 turns activity    Assist        Assist Level: Supervision/Verbal cueing   Wheelchair 150 feet activity     Assist Wheelchair 150 feet activity did not occur: Safety/medical concerns        Medical Problem List and Plan: 1.Right side weakness with facial droop and dysarthriasecondary to 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 Formulized individual treatment plan based on medical history, underlying problem and comorbidities. 2. DVT Prophylaxis/Anticoagulation: Subcutaneous Lovenox. Monitor for any bleeding episodes 3. Pain Management:Tylenol as needed 4. Mood:Provide emotional support 5. Neuropsych: This patientiiscapable of making decisions on hisown behalf. 6. Skin/Wound Care:Routine skin checks 7. Fluids/Electrolytes/Nutrition:Routine in and out's with follow-up chemistries 8. Diabetes mellitus with peripheral neuropathy. Hemoglobin A1c 11.3. Glucotrol 5 mg twice a day, Glucophage d/ced due to nausea, no recurrence of nausea or vomiting or diarrhea -pt complains of nausea at times, need to consider metformin as source -Check blood sugars before meals and at bedtime. -Diabetic teaching CBG (last 3)  Recent Labs    05/18/18 1157 05/18/18 1723 05/19/18 0622  GLUCAP 266* 165* 146*  increase glucotrol to 15m BID 9. Permissive hypertension. [facility] Vitals:   05/18/18 2017 05/19/18 0557  BP: 126/70 112/68  Pulse: (!) 58 60  Resp: 18 18  Temp: 98 F (36.7 C) 98 F (36.7 C)  SpO2: 94% 95%  Controlled 2/26 no meds 10. Hyperlipidemia. Lipitor 11. Tobacco abuse. Counseling    LOS: 8 days A FACE  TO FACE EVALUATION WAS PERFORMED  ACharlett Blake2/26/2020, 10:12 AM

## 2018-05-19 NOTE — Progress Notes (Signed)
Occupational Therapy Session Note  Patient Details  Name: Arthur Howard MRN: [id number] Date of Birth: 1965-04-01  Today's Date: 05/19/2018 OT Individual Time: 0903-1000 OT Individual Time Calculation (min): 57 min    Short Term Goals: Week 1:  OT Short Term Goal 1 (Week 1): Pt will complete bathing at sit > stand level with min assist OT Short Term Goal 2 (Week 1): Pt will complete LB dressing to include donning socks/shoes with min assist OT Short Term Goal 3 (Week 1): Pt will complete toilet transfers with min assist with LRAD OT Short Term Goal 4 (Week 1): Pt will utilize RUE as gross assist during self-care tasks with min assist and cues  Skilled Therapeutic Interventions/Progress Updates:    Treatment session with focus on RUE NMR.  Pt received upright in w/c expressing desire to work on [place].  Pt ambulated to therapy gym with [facility] with supervision.  Engaged in reaching with BUE shoulder flexion and bicep curls with 2# medicine ball with focus on increased symmetry and minimizing compensatory shoulder hike/movements.  Engaged in box and blocks assessment with Rt: 63 and Lt: 28 blocks in one minute.  Pt demonstrating increased functional grasp.  Engaged in pipe tree puzzle with focus on functional reach and manipulation of pvc pipe pieces with pt able to complete with increased time.  Therapist providing tactile cues at shoulder to minimize shoulder hike.  Returned to room and left upright in w/c with all needs in reach.  Medical interpreter present and interpreting throughout.  Therapy Documentation Precautions:  Precautions Precautions: Fall Precaution Comments: right hemiparesis Restrictions Weight Bearing Restrictions: No General:   Vital Signs: Therapy Vitals Temp: 98.2 F (36.8 C) Temp Source: Oral Pulse Rate: (!) 57 Resp: 18 BP: 123/65 Patient Position (if appropriate): Lying Oxygen Therapy SpO2: 97 % O2 Device: Room Air Pain: Pain Assessment Pain Score: 0-No  pain   Therapy/Group: Individual Therapy  Simonne Come 05/19/2018, 4:47 PM

## 2018-05-19 NOTE — Progress Notes (Signed)
Physical Therapy Session Note  Patient Details  Name: Arthur Howard MRN: [id number] Date of Birth: 1965/09/04  Today's Date: 05/19/2018 PT Individual Time: 1005-1103 PT Individual Time Calculation (min): 58 min   Short Term Goals: Week 1:  PT Short Term Goal 1 (Week 1): Pt will ambulate 75 ft with LRAD & min assist.  PT Short Term Goal 2 (Week 1): Pt negotiates single step without rails with LRAD & min assist.  PT Short Term Goal 3 (Week 1): Pt will complete bed<>w/c transfers with CGA.  Skilled Therapeutic Interventions/Progress Updates:    Patient in w/c in room with family present.  Performed sit to stand with R AFO and cane with S.  Gait with close S with cane to gym 150'.  Performed standing without cane feet apart 30 sec, feet together 30 sec and eyes close 30 sec.  Negotiated 4 steps with cane only and minguard A and cues for safety/technique.  Noted pt with R knee hyperextension in terminal stance with gait with cane and AFO.  Primary PT in room reported orthotist recommended AFO and heel lift in shoe for gait and fitting this pm for permanent AFO.  Patient performed taps to 6" step with L stance on R min A for knee control to prevent HE.  Step taps down with L from 6" then 4" step with 1 UE supported (switched to 4" due to cramping on R lateral lower leg and ankle.)  Patient standing on R for taps to BOSU with L x 2 x 10 with cane for support and no AFO and min support at knee cues for "soft knee".  In parallel bars on flat part of BOSU with assist to place feet apart with lateral weight shifts and cues for knee and hip mechanics, assist for balance and hip control.  Patient single limb on R circling soccer ball under L x 10 CW/CCW with SPC; then alternate taps to soccer ball with assist with cane then [facility] assist and cues for R hip and knee control.  Patient demonstrated floor to mat transfer with S, cues for slower transitions for safety.  Tall kneeling on mat on floor side steps with stool  for UE support, then alternating to half kneeling R/L with A for R with UE support.  No UE support tall kneel to heel sit.  Gait to room with cane and AFO and S.  Left in w/c with needs in reach and family in room.  Interpreter Vicente Males present throughout session.   Therapy Documentation Precautions:  Precautions Precautions: Fall Precaution Comments: right hemiparesis Restrictions Weight Bearing Restrictions: No Pain: Pain Assessment Pain Score: 0-No pain    Therapy/Group: Individual Therapy  Reginia Naas  Magda Kiel, PT 05/19/2018, 12:49 PM

## 2018-05-19 NOTE — Progress Notes (Signed)
Physical Therapy Discharge Summary  Patient Details  Name: Arthur Howard MRN: [id number] Date of Birth: Mar 02, 1966  Today's Date: 05/20/2018  Patient has met 10 of 10 long term goals due to improved activity tolerance, improved balance, improved postural control, increased strength, increased range of motion, ability to compensate for deficits, functional use of  right upper extremity and right lower extremity, improved attention, improved awareness and improved coordination.  Patient to discharge at an ambulatory level supervision with [facility] & R AFO.   Patient's care partner is independent to provide the necessary physical assistance at discharge.  Reasons goals not met: n/a  Recommendation:  Patient will benefit from ongoing skilled PT services in outpatient setting to continue to advance safe functional mobility, address ongoing impairments in R UE/LE NMR & strengthening, high level balance, endurance, and minimize fall risk.  Equipment: R AFO & SPC, heel wedge  Reasons for discharge: treatment goals met and discharge from hospital  Patient/family agrees with progress made and goals achieved: Yes  PT Discharge Precautions/Restrictions Precautions Precautions: Fall Restrictions Weight Bearing Restrictions: No  Vision/Perception  Pt wears glasses for reading only at baseline. Pt denies changes in baseline vision. Perception WNL.   Cognition Overall Cognitive Status: Within Functional Limits for tasks assessed Arousal/Alertness: Awake/alert Orientation Level: Oriented X4 Memory: Appears intact Awareness: Appears intact Problem Solving: Appears intact Safety/Judgment: Appears intact  Sensation Sensation Light Touch: Appears Intact(BLE) Proprioception: Appears Intact(intact BLE) Coordination Gross Motor Movements are Fluid and Coordinated: No(impaired RUE/RLE)  Motor  Motor Motor: Abnormal postural alignment and control   Mobility Bed Mobility Bed Mobility: Rolling  Right;Rolling Left;Supine to Sit;Sit to Supine Rolling Right: Independent Rolling Left: Independent Supine to Sit: Independent Sit to Supine: Independent Transfers Transfers: Stand to Sit;Sit to Stand;Stand Pivot Transfers Sit to Stand: Independent with assistive device Stand to Sit: Independent with assistive device Stand Pivot Transfers: Independent with assistive device  Locomotion  Gait Ambulation: Yes Gait Assistance: Supervision/Verbal cueing Gait Distance (Feet): 150 Feet Assistive device: Straight cane(R AFO, R heel wedge) Gait Assistance Details: (verbal cuing to decrease gait speed) Gait Gait: Yes Gait Pattern: Decreased stance time - right(decreased dorsiflexion & heel strike that's corrected with AFO, R genu recurvatum in stance phase) Stairs / Additional Locomotion Stairs: Yes Stairs Assistance: Supervision/Verbal cueing Stair Management Technique: With cane Number of Stairs: 12 Height of Stairs: 6(inches) Ramp: Supervision/Verbal cueing(ambulatory with SPC) Wheelchair Mobility Wheelchair Mobility: No   Trunk/Postural Assessment  Postural Control Righting Reactions: delayed   Balance Standardized Balance Assessment Standardized Balance Assessment: Berg Balance Test Berg Balance Test Sit to Stand: Able to stand without using hands and stabilize independently Standing Unsupported: Able to stand safely 2 minutes Sitting with Back Unsupported but Feet Supported on Floor or Stool: Able to sit safely and securely 2 minutes Stand to Sit: Sits safely with minimal use of hands Transfers: Able to transfer safely, minor use of hands Standing Unsupported with Eyes Closed: Able to stand 10 seconds safely Standing Ubsupported with Feet Together: Able to place feet together independently and stand 1 minute safely From Standing, Reach Forward with Outstretched Arm: Can reach confidently >25 cm (10") From Standing Position, Pick up Object from Floor: Able to pick up shoe  safely and easily From Standing Position, Turn to Look Behind Over each Shoulder: Looks behind one side only/other side shows less weight shift Turn 360 Degrees: Able to turn 360 degrees safely but slowly Standing Unsupported, Alternately Place Feet on Step/Stool: Able to complete 4 steps without aid or supervision(w/  RAFO) Standing Unsupported, One Foot in Front: Able to place foot tandem independently and hold 30 seconds Standing on One Leg: Able to lift leg independently and hold > 10 seconds(standing on LLE) Total Score: 51 Dynamic Standing Balance Dynamic Standing - Balance Support: No upper extremity supported;During functional activity Dynamic Standing - Level of Assistance: 5: Stand by assistance   Extremity Assessment    LUE Assessment LUE Assessment: Within Functional Limits  RLE Assessment RLE Assessment: Exceptions to [facility] Passive Range of Motion (PROM) Comments: [facility] General Strength Comments: 1/5 ankle dorsiflexion, all other joints not formally tested LLE Assessment LLE Assessment: Within Functional Limits   Lavone Nian, PT, DPT 05/19/2018, 3:57 PM  Burnard Bunting, PT, DPT 05/20/2018, 12:45 PM

## 2018-05-19 NOTE — Progress Notes (Deleted)
[place] PHYSICAL MEDICINE & REHABILITATION PROGRESS NOTE   Subjective/Complaints:   Per OT, RIght hand function doing well  ROS- no CP, SOB, + dizziness, no abd pain   Objective:   No results found. No results for input(s): WBC, HGB, HCT, PLT in the last 72 hours. Recent Labs    05/18/18 0657  CREATININE 0.89    Intake/Output Summary (Last 24 hours) at 05/19/2018 1001 Last data filed at 05/19/2018 0838 Gross per 24 hour  Intake 480 ml  Output -  Net 480 ml     Physical Exam: Vital Signs Blood pressure 112/68, pulse 60, temperature 98 F (36.7 C), temperature source Oral, resp. rate 18, height '5\' 8"'  (1.727 m), weight 69 kg, SpO2 95 %.  General: No acute distress Mood and affect are appropriate Heart: Regular rate and rhythm no rubs murmurs or extra sounds Lungs: Clear to auscultation, breathing unlabored, no rales or wheezes Abdomen: Positive bowel sounds, soft nontender to palpation, nondistended Extremities: No clubbing, cyanosis, or edema Skin: No evidence of breakdown, no evidence of rash Neurologic: Cranial nerves II through XII intact, motor strength is 5/5 in LEFT deltoid, bicep, tricep, grip, hip flexor, knee extensors, ankle dorsiflexor and plantar flexor 3- RIght triceps, 2- finger ext , tr in WF and FF, 2-/5 delt, + trap, 3- hip /knee ext, 0 at ankle Sensory exam normal sensation to light touch in bilateral upper and lower extremities  Musculoskeletal: Full range of motion in all 4 extremities. No joint swelling    Assessment/Plan: 1. Functional deficits secondary to RIght hemiparesis which require 3+ hours per day of interdisciplinary therapy in a comprehensive inpatient rehab setting.  Physiatrist is providing close team supervision and 24 hour management of active medical problems listed below.  Physiatrist and rehab team continue to assess barriers to discharge/monitor patient progress toward functional and medical goals  Care Tool:  Bathing     Body parts bathed by patient: Right arm, Chest, Abdomen, Front perineal area, Right upper leg, Left upper leg, Left lower leg, Face, Left arm, Buttocks, Right lower leg(used long sponge)   Body parts bathed by helper: Right lower leg, Buttocks, Left arm     Bathing assist Assist Level: Contact Guard/Touching assist     Upper Body Dressing/Undressing Upper body dressing   What is the patient wearing?: Pull over shirt    Upper body assist Assist Level: Minimal Assistance - Patient > 75%    Lower Body Dressing/Undressing Lower body dressing      What is the patient wearing?: Underwear/pull up, Pants     Lower body assist Assist for lower body dressing: Minimal Assistance - Patient > 75%     Toileting Toileting    Toileting assist Assist for toileting: Moderate Assistance - Patient 50 - 74%     Transfers Chair/bed transfer  Transfers assist     Chair/bed transfer assist level: Supervision/Verbal cueing     Locomotion Ambulation   Ambulation assist      Assist level: Supervision/Verbal cueing Assistive device: Cane-straight(R AFO) Max distance: 150 ft    Walk 10 feet activity   Assist     Assist level: Supervision/Verbal cueing Assistive device: Cane-straight(R AFO)   Walk 50 feet activity   Assist Walk 50 feet with 2 turns activity did not occur: Safety/medical concerns  Assist level: Supervision/Verbal cueing Assistive device: Cane-straight(R AFO)    Walk 150 feet activity   Assist Walk 150 feet activity did not occur: Safety/medical concerns  Assist level: Supervision/Verbal cueing  Assistive device: Cane-straight(R AFO)    Walk 10 feet on uneven surface  activity   Assist     Assist level: Moderate Assistance - Patient - 50 - 74%     Wheelchair     Assist Will patient use wheelchair at discharge?: No Type of Wheelchair: Manual    Wheelchair assist level: Supervision/Verbal cueing Max wheelchair distance: 100 ft      Wheelchair 50 feet with 2 turns activity    Assist        Assist Level: Supervision/Verbal cueing   Wheelchair 150 feet activity     Assist Wheelchair 150 feet activity did not occur: Safety/medical concerns        Medical Problem List and Plan: 1.Right side weakness with facial droop and dysarthriasecondary to 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 Formulized individual treatment plan based on medical history, underlying problem and comorbidities. 2. DVT Prophylaxis/Anticoagulation: Subcutaneous Lovenox. Monitor for any bleeding episodes 3. Pain Management:Tylenol as needed 4. Mood:Provide emotional support 5. Neuropsych: This patientiiscapable of making decisions on hisown behalf. 6. Skin/Wound Care:Routine skin checks 7. Fluids/Electrolytes/Nutrition:Routine in and out's with follow-up chemistries 8. Diabetes mellitus with peripheral neuropathy. Hemoglobin A1c 11.3. Glucotrol 5 mg twice a day, Glucophage d/ced due to nausea, no recurrence of nausea or vomiting or diarrhea -pt complains of nausea at times, need to consider metformin as source -Check blood sugars before meals and at bedtime. -Diabetic teaching CBG (last 3)  Recent Labs    05/18/18 1157 05/18/18 1723 05/19/18 0622  GLUCAP 266* 165* 146*  increase glucotrol to 9m BID 9. Permissive hypertension. Patient on [facility]ensive medications [facility] . Vitals:   05/18/18 2017 05/19/18 0557  BP: 126/70 112/68  Pulse: (!) 58 60  Resp: 18 18  Temp: 98 F (36.7 C) 98 F (36.7 C)  SpO2: 94% 95%  Controlled 2/26 no meds 10. Hyperlipidemia. Lipitor 11. Tobacco abuse. Counseling    LOS: 8 days A FACE  TO FACE EVALUATION WAS PERFORMED  ACharlett Blake2/26/2020, 10:01 AM

## 2018-05-20 ENCOUNTER — Inpatient Hospital Stay (HOSPITAL_COMMUNITY): Payer: Self-pay | Admitting: Physical Therapy

## 2018-05-20 ENCOUNTER — Inpatient Hospital Stay (HOSPITAL_COMMUNITY): Payer: Self-pay | Admitting: Occupational Therapy

## 2018-05-20 LAB — GLUCOSE, CAPILLARY
GLUCOSE-CAPILLARY: 156 mg/dL — AB (ref 70–99)
Glucose-Capillary: 165 mg/dL — ABNORMAL HIGH (ref 70–99)
Glucose-Capillary: 161 mg/dL — ABNORMAL HIGH (ref 70–99)
Glucose-Capillary: 149 mg/dL — ABNORMAL HIGH (ref 70–99)

## 2018-05-20 NOTE — Progress Notes (Signed)
Physical Therapy Session Note  Patient Details  Name: Arthur Howard MRN: [id number] Date of Birth: 14-Dec-1965  Today's Date: 05/20/2018 PT Individual Time: 1000-1055 PT Individual Time Calculation (min): 55 min   Skilled Therapeutic Interventions/Progress Updates:   Pt received in care of staff in therapy gym, agreeable to therapy and denies pain. Ambulated around unit in >500' bouts w/ SPC including ramp, curb, and uneven surface, all w/ distant supervision. Negotiated 12 steps w/ unilateral rail, performed car transfer, and performed floor transfer w/ supervision. Performed Berg Balance Scale, scored 51/56 as detailed in discharge summary. Explained significance of results to pt and family members. Performed NMES to R anterior tib musculature as detailed below, pt tolerated well (no adverse reaction on skin under electrodes) and therapist provided manual facilitation into DF during on-phase of contraction. Returned to room, performed bed mobility w/ supervision and ended session in w/c and in care of family members, all needs met.   NMES parameters: 10 min duration, small muscle 40 frequency  10 sec on time, 5 sec off time Rate 35 Width 300 Asymmetric waveform  Therapy Documentation Precautions:  Precautions Precautions: Fall Precaution Comments: right hemiparesis Restrictions Weight Bearing Restrictions: No  Therapy/Group: Individual Therapy  Ariyannah Pauling K Nathaniel Wakeley 05/20/2018, 11:01 AM

## 2018-05-20 NOTE — Progress Notes (Signed)
Social Work Patient ID: Arthur Howard, male   DOB: 08-Jun-1965, 53 y.o.   MRN: [id number]   CSW met with pt and his wife and dtr with assistance from interpreter on 05-19-18 and today to update them on team conference discussion and to confirm d/c 05-21-18.  Pt/family feel prepared for d/c and they will take him for outpt PT/OT.  CSW found pt a PCP who is spanish speaking and arranged post hospital f/u appt.  Also ordered single point cane.  CSW remains available as needed to assist.  Pt/family were very appreciative of CIR care.

## 2018-05-20 NOTE — Discharge Summary (Signed)
Arthur Howard, Arthur Howard MEDICAL RECORD [id number] ACCOUNT [id number] DATE OF BIRTH:04-11-1965 FACILITY: [facility] LOCATION: [facility] PHYSICIAN:ANDREW Letta Pate, MD  DISCHARGE SUMMARY  DATE OF DISCHARGE:  05/21/2018  DISCHARGE DIAGNOSES: 1.  Right-sided weakness and facial droop secondary to left pontine infarction. 2.  Subcutaneous Lovenox for deep venous thrombosis prophylaxis. 3.  Pain management. 4.  Mood.   5.  Diabetes mellitus.  6.  Permissive hypertension. 7.  Hyperlipidemia,  8.  Tobacco abuse.  HOSPITAL COURSE:  This is a 53 year old right-handed male, non-English speaking,  history of tobacco abuse, diabetes mellitus.  Lives with wife and daughter.  Independent prior to admission.  Presented 04/26/2018 with left-sided weakness, facial droop,  mild slurred speech.  Cranial CT scan showed small age uncertain infarction involving a portion of the genu and posterior limb of right internal capsule.  Urine drug screen negative.  CT angiogram of head and neck negative.  He did not receive tPA.  MRI  showed small acute infarction, left inferior ventral pons.  No signs of hemorrhage.  Echocardiogram with ejection fraction 65%.  Maintained on aspirin and Plavix for CVA prophylaxis x3 weeks, then aspirin alone.  Subcutaneous Lovenox for DVT prophylaxis.   Tolerating a regular diet.  Therapy evaluations completed and the patient was admitted for comprehensive rehabilitation program.  PAST MEDICAL HISTORY:  See discharge diagnoses.  SOCIAL HISTORY:  Lives with wife and daughter.  Independent prior to admission.  FUNCTIONAL STATUS:  Upon admission to rehab services was min mod assist 200 feet 1 person handheld assist, moderate assist with stand pivot transfers, min mod assist with ADLs.  PHYSICAL EXAMINATION: VITAL SIGNS:  Blood pressure 123/71, pulse 52, temperature 97, respirations 18. GENERAL:  Alert male.  Limited English speaking. HEENT:  EOMs intact. NECK:  Supple, nontender, no  JVD. CARDIOVASCULAR:  Rate controlled. ABDOMEN:  Soft, nontender, good bowel sounds. LUNGS:  Clear to auscultation without wheeze.  REHABILITATION HOSPITAL COURSE:  The patient was admitted to inpatient rehabilitation services.  Therapies initiated on a 3-hour daily basis, consisting of physical therapy, occupational therapy and rehabilitation nursing.  The following issues were  addressed:    Pertaining to the patient's left pontine infarction.  He remained on aspirin and Plavix therapy x3 weeks, then aspirin alone.  He would follow up ambulatory referral with neurology services.  He remained on subcutaneous Lovenox for DVT prophylaxis.  No  bleeding episodes.    Pain management with the use of Tylenol.  Mood support for event of CVA.  He was attending full therapies.    Diabetes mellitus, peripheral neuropathy.  Hemoglobin A1c 11.3.  He remained on Glucotrol full diabetic teaching.    Close monitoring of blood pressures.  He continued on Lipitor for hyperlipidemia.    He did have a history of tobacco abuse, receiving full counts regards to cessation of nicotine products.  It was questionable he would be compliant with these requests.    The patient received weekly collaborative interdisciplinary team conferences to discuss estimated length of stay, family teaching, any barriers to discharge.  He performs sit to stand with placement of a right AFO brace using cane.  Ambulates supervision  with a cane 150 feet.  Performed standing without cane, supervision.  Negotiated stairs with cane, minimal guard.  He could gather his belongings for activities of daily living and homemaking.  Fully able to communicate his needs to his family.  It was  advised no driving.  Family teaching completed and planned discharge to home.  DISCHARGE MEDICATIONS:  Included aspirin 81 mg p.o. daily, Lipitor 80 mg p.o. daily, Plavix 75 mg p.o. daily, Pepcid 20 mg p.o. b.i.d., Glucotrol 10 mg p.o. b.i.d., Tylenol as  needed.  DIET:  Diabetic diet.  FOLLOWUP:  The patient would follow up with Dr. Alysia Penna at the outpatient rehab center as directed; Dr. Isaac Bliss of [place].  Ambulatory referral obtained with neurology services.  SPECIAL INSTRUCTIONS:  No smoking or driving.  Also, patient would remain on aspirin and Plavix x3 weeks, then aspirin alone.  AN/NUANCE D:05/20/2018 T:05/20/2018 JOB:[id number]

## 2018-05-20 NOTE — Progress Notes (Signed)
[place] PHYSICAL MEDICINE & REHABILITATION PROGRESS NOTE   Subjective/Complaints:  Seen in gym, daughter as well as interpreter are with him.  No other new issues. Discussed complaints of dizziness I believe this is stroke related and not related to his Glucotrol.  We discussed the importance of controlling blood sugar  Patient also has some back pain upon further eval he states that it is really just below his shoulder blade on the right side.  It is when he is reaching up with his right arm.  He did not have this problem prior to stroke.   ROS- no CP, SOB, + dizziness, no abd pain   Objective:   No results found. No results for input(s): WBC, HGB, HCT, PLT in the last 72 hours. Recent Labs    05/18/18 0657  CREATININE 0.89    Intake/Output Summary (Last 24 hours) at 05/20/2018 5329 Last data filed at 05/19/2018 1900 Gross per 24 hour  Intake 240 ml  Output -  Net 240 ml     Physical Exam: Vital Signs Blood pressure 112/73, pulse 62, temperature 97.9 F (36.6 C), temperature source Oral, resp. rate 15, height 5\' 8"  (1.727 m), weight 69 kg, SpO2 95 %.  General: No acute distress Mood and affect are appropriate Heart: Regular rate and rhythm no rubs murmurs or extra sounds Lungs: Clear to auscultation, breathing unlabored, no rales or wheezes Abdomen: Positive bowel sounds, soft nontender to palpation, nondistended Extremities: No clubbing, cyanosis, or edema Skin: No evidence of breakdown, no evidence of rash Neurologic: Cranial nerves II through XII intact, motor strength is 5/5 in LEFT deltoid, bicep, tricep, grip, hip flexor, knee extensors, ankle dorsiflexor and plantar flexor 3- RIght triceps, 2- finger ext , tr in WF and FF, 2-/5 delt, + trap, 3- hip /knee ext, 0 at ankle Sensory exam normal sensation to light touch in bilateral upper and lower extremities  Musculoskeletal: Full range of motion in all 4 extremities. No joint swelling, mild tenderness  palpitation just inferior to the angle of the scapula while patient is elevating his right arm    Assessment/Plan: 1. Functional deficits secondary to RIght hemiparesis which require 3+ hours per day of interdisciplinary therapy in a comprehensive inpatient rehab setting.  Physiatrist is providing close team supervision and 24 hour management of active medical problems listed below.  Physiatrist and rehab team continue to assess barriers to discharge/monitor patient progress toward functional and medical goals  Care Tool:  Bathing    Body parts bathed by patient: Right arm, Chest, Abdomen, Front perineal area, Right upper leg, Left upper leg, Left lower leg, Face, Left arm, Buttocks, Right lower leg(used long sponge)   Body parts bathed by helper: Right lower leg, Buttocks, Left arm     Bathing assist Assist Level: Contact Guard/Touching assist     Upper Body Dressing/Undressing Upper body dressing   What is the patient wearing?: Pull over shirt    Upper body assist Assist Level: Minimal Assistance - Patient > 75%    Lower Body Dressing/Undressing Lower body dressing      What is the patient wearing?: Underwear/pull up, Pants     Lower body assist Assist for lower body dressing: Minimal Assistance - Patient > 75%     Toileting Toileting    Toileting assist Assist for toileting: Moderate Assistance - Patient 50 - 74%     Transfers Chair/bed transfer  Transfers assist     Chair/bed transfer assist level: Supervision/Verbal cueing  Locomotion Ambulation   Ambulation assist      Assist level: Supervision/Verbal cueing Assistive device: Cane-straight(R AFO) Max distance: >500 ft    Walk 10 feet activity   Assist     Assist level: Supervision/Verbal cueing Assistive device: Cane-straight(R AFO)   Walk 50 feet activity   Assist Walk 50 feet with 2 turns activity did not occur: Safety/medical concerns  Assist level: Supervision/Verbal  cueing Assistive device: Cane-straight(R AFO)    Walk 150 feet activity   Assist Walk 150 feet activity did not occur: Safety/medical concerns  Assist level: Supervision/Verbal cueing Assistive device: Cane-straight(R AFO)    Walk 10 feet on uneven surface  activity   Assist     Assist level: Supervision/Verbal cueing Assistive device: Cane-straight(R AFO)   Wheelchair     Assist Will patient use wheelchair at discharge?: No Type of Wheelchair: Manual    Wheelchair assist level: Supervision/Verbal cueing Max wheelchair distance: 100 ft     Wheelchair 50 feet with 2 turns activity    Assist        Assist Level: Supervision/Verbal cueing   Wheelchair 150 feet activity     Assist Wheelchair 150 feet activity did not occur: Safety/medical concerns        Medical Problem List and Plan: 1.Right side weakness with facial droop and dysarthriasecondary to 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 DVT Prophylaxis/Anticoagulation: Subcutaneous Lovenox. Monitor for any bleeding episodes 3. Pain Management:Tylenol as needed, parascapular pain due to abnormal substitution patterns post stroke.  We will do counter irritant cream as well as heat 4. Mood:Provide emotional support 5. Neuropsych: This patientiiscapable of making decisions on hisown behalf. 6. Skin/Wound Care:Routine skin checks 7. Fluids/Electrolytes/Nutrition:Routine in and out's with follow-up chemistries 8. Diabetes mellitus with peripheral neuropathy. Hemoglobin A1c 11.3. Glucotrol 5 mg twice a day, Glucophage d/ced due to nausea, no recurrence of nausea or vomiting or diarrhea -pt complains of nausea at times, need to consider metformin as source -Check blood sugars before meals and at bedtime. -Diabetic teaching CBG (last 3)  Recent Labs    05/19/18 1210 05/19/18 1727  05/19/18 2109  GLUCAP 236* 169* 105*  increase glucotrol to 10mg  BID, appears to be responding well 9. Permissive hypertension. Patient on no antihypertensive medications prior to [facility] with increased mobility . Vitals:   05/19/18 1945 05/20/18 0450  BP: 125/67 112/73  Pulse: 61 62  Resp: 15 15  Temp: 97.9 F (36.6 C) 97.9 F (36.6 C)  SpO2: 94% 95%  Controlled 2/27 no meds 10. Hyperlipidemia. Lipitor 11. Tobacco abuse. Counseling    LOS: 9 days A FACE TO FACE EVALUATION WAS PERFORMED  Charlett Blake 05/20/2018, 9:21 AM

## 2018-05-20 NOTE — Progress Notes (Addendum)
Occupational Therapy Session Note  Patient Details  Name: Arthur Howard MRN: [id number] Date of Birth: 12-24-1965  Today's Date: 05/20/2018 OT Individual Time: 0900-1000 and 0814-4818 OT Individual Time Calculation (min): 60 min and 72 min   Short Term Goals: Week 1:  OT Short Term Goal 1 (Week 1): Pt will complete bathing at sit > stand level with min assist OT Short Term Goal 2 (Week 1): Pt will complete LB dressing to include donning socks/shoes with min assist OT Short Term Goal 3 (Week 1): Pt will complete toilet transfers with min assist with LRAD OT Short Term Goal 4 (Week 1): Pt will utilize RUE as gross assist during self-care tasks with min assist and cues  Skilled Therapeutic Interventions/Progress Updates:   1) Treatment session with focus on functional mobility, bathroom transfers, and RUE NMR.  Pt received upright in recliner reporting already bathing and dressing with family providing supervision.  Pt with no c/o pain.  Pt reports ability to don socks and shoes but requiring assistance with Rt shoe since receiving new AFO.  Therapist provided pt with shoe horn, pt then able to complete without assistance.  Engaged in bathroom transfers in ADL apt with focus on toilet transfer and walk-in shower transfer.  Pt completed transfers with supervision with use of SPC.  Pt able to step over 3" ledge as needed to access home shower with supervision without shoes and AFO.  Engaged in [place] with use of theraputty.  Provided pt with HEP theraputty handout and discussed each exercise while pt returned demonstration. Educated pt and daughter on "BE FAST" for stroke signs and symptoms with each reporting understanding.  Engaged in modified 9 hole peg test with largers pegs and pegboard with pt able to complete in 3:02 then completed with standard size pegs with pt able to complete in 4:09 (only able to place 3 pegs in 2 min time limit).  Pt passed off to PT.  2) Treatment session with focus on  RUE NMR and dynamic standing balance.  Pt with no c/o pain.  Pt ambulated to therapy gym with [facility] and supervision. Engaged in wall pushups and rolling ball up wall to challenge and increase shoulder ROM.  Therapist provided cues and hand over hand to ensure proper placement of RUE when completing wall pushups.  Provided pt with fine motor control HEP handout and engaged in education of each recommended exercise with pt able to demonstrate understanding.  Pt continues to demonstrate decreased ability to complete all finger flexion to sustain grasp on small items in hand, however is able to utilize gross grasp to maintain grasp on larger items and engage in bathing and grooming tasks with RUE.  Educated pt and daughter on modifications to HEP to increase success and then increase challenge as pt continues to progress.  Returned to room and left seated upright on EOB with family present.  Pt reports pleased with progress and is ready for d/c home.  Therapy Documentation Precautions:  Precautions Precautions: Fall Precaution Comments: right hemiparesis Restrictions Weight Bearing Restrictions: No General:   Vital Signs: Therapy Vitals Temp: 99 F (37.2 C) Temp Source: Oral Pulse Rate: 94 Resp: 18 BP: 124/80 Patient Position (if appropriate): Sitting Oxygen Therapy SpO2: 95 % O2 Device: Room Air Pain:  Pt with on c/o pain.   Therapy/Group: Individual Therapy  Simonne Come 05/20/2018, 3:23 PM

## 2018-05-20 NOTE — Progress Notes (Signed)
Social Work Discharge Note  The overall goal for the admission was met for:   Discharge location: Yes - to his home with wife and dtrs to provide 24/7 supervision  Length of Stay: Yes - 10 days  Discharge activity level: Yes - supervision  Home/community participation: Yes  Services provided included: MD, RD, PT, OT, RN, CM, TR, Pharmacy and SW  Financial Services: Private Insurance: [employer]  Follow-up services arranged: Outpatient: PT/OT at [facility], DME: single point cane from [facility] and Patient/Family has no preference for HH/DME agencies  Comments (or additional information):  Pt to have 24/7 supervision from family.  Someone has been with pt his entire admission and they have participated in therapies.  CSW has assisted pt/family with FMLA and short term disability forms, as needed.  Dtrs are bilingual and should be contacted for f/u as needed. Lisa Pine - dtr - [phone] [name] Steward - dtr - [phone] Marlen Moise - dtr - [phone]  Patient/Family verbalized understanding of follow-up arrangements: Yes  Individual responsible for coordination of the follow-up plan: pt and his wife and dtrs  Confirmed correct DME delivered: Prevatt, Jennifer Capps 05/20/2018    Prevatt, Jennifer Capps 

## 2018-05-20 NOTE — Discharge Summary (Signed)
Discharge summary job [phone]

## 2018-05-20 NOTE — Progress Notes (Signed)
Occupational Therapy Discharge Summary  Patient Details  Name: Arthur Howard MRN: [id number] Date of Birth: 1966-02-17  Patient has met 69 of 11 long term goals due to improved activity tolerance, improved balance, postural control, ability to compensate for deficits, functional use of  RIGHT upper and RIGHT lower extremity, improved attention, improved awareness and improved coordination.  Patient to discharge at overall Supervision level.  Patient's care partner is independent to provide the necessary supervision assistance at discharge.  Patient's wife and daughters have been present for majority of therapy sessions and report understanding of all recommendations and have demonstrated ability to provide supervision with self-care tasks and transfers with [facility].  Reasons goals not met: N/A  Recommendation:  Patient will benefit from ongoing skilled OT services in outpatient setting to continue to advance functional skills in the area of BADL, iADL, Vocation and Reduce care partner burden.  Equipment: No equipment provided.  Pt family purchased shower seat.  Reasons for discharge: treatment goals met and discharge from hospital  Patient/family agrees with progress made and goals achieved: Yes  OT Discharge Precautions/Restrictions  Precautions Precautions: Fall Precaution Comments: right hemiparesis General   Vital Signs Therapy Vitals Temp: 99 F (37.2 C) Temp Source: Oral Pulse Rate: 94 Resp: 18 BP: 124/80 Patient Position (if appropriate): Sitting Oxygen Therapy SpO2: 95 % O2 Device: Room Air Pain Pain Assessment Pain Scale: 0-10 Pain Score: 0-No pain ADL ADL Eating: Set up Where Assessed-Eating: Chair Grooming: Supervision/safety Where Assessed-Grooming: Standing at sink Upper Body Bathing: Supervision/safety Where Assessed-Upper Body Bathing: Shower Lower Body Bathing: Supervision/safety Where Assessed-Lower Body Bathing: Shower Upper Body Dressing:  Supervision/safety Where Assessed-Upper Body Dressing: Chair Lower Body Dressing: Supervision/safety Where Assessed-Lower Body Dressing: Chair Toileting: Supervision/safety Where Assessed-Toileting: Glass blower/designer: Distant supervision Armed forces technical officer Method: Counselling psychologist: [facility]) Social research officer, government: Close supervision Social research officer, government Method: Heritage manager: Civil engineer, contracting with back(SPC) Vision Baseline Vision/History: Wears glasses Wears Glasses: Reading only Patient Visual Report: No change from baseline Vision Assessment?: Yes Eye Alignment: Within Functional Limits Ocular Range of Motion: Within Functional Limits Alignment/Gaze Preference: Within Defined Limits Tracking/Visual Pursuits: Able to track stimulus in all quads without difficulty Saccades: Within functional limits Visual Fields: No apparent deficits Perception  Perception: Within Functional Limits Praxis Praxis: Intact Cognition Overall Cognitive Status: Within Functional Limits for tasks assessed Arousal/Alertness: Awake/alert Orientation Level: Oriented X4 Attention: Selective Selective Attention: Appears intact Memory: Appears intact Awareness: Appears intact Problem Solving: Appears intact Safety/Judgment: Appears intact Sensation Sensation Light Touch: Appears Intact Hot/Cold: Appears Intact Proprioception: Appears Intact Coordination Gross Motor Movements are Fluid and Coordinated: No(impaired RUE/RLE) Fine Motor Movements are Fluid and Coordinated: No Finger Nose Finger Test: slow and deliberate, however able to complete 9 Hole Peg Test: Rt: 4:09 on 2/27, unable to complete on eval Motor  Motor Motor: Abnormal postural alignment and control Extremity/Trunk Assessment RUE Assessment RUE Assessment: Exceptions to [facility] Passive Range of Motion (PROM) Comments: WFL Active Range of Motion (AROM) Comments: shoulder flexion 120*, elbow  flexion WFL, extension to 5* from neutral, wrist extension WFL, trace wrist flexion, gross finger flexion/extension - able to complete thumb opposition to first 2 digits General Strength Comments: 3+/5 tricep and bicep RUE Body System: Neuro Brunstrum levels for arm and hand: Arm;Hand Brunstrum level for arm: Stage IV Movement is deviating from synergy Brunstrum level for hand: Stage III Synergies performed voluntarily LUE Assessment LUE Assessment: Within Functional Limits Active Range of Motion (AROM) Comments: WFL General Strength Comments: grossly 5/5  Simonne Come 05/20/2018, 3:52 PM

## 2018-05-21 LAB — GLUCOSE, CAPILLARY: GLUCOSE-CAPILLARY: 124 mg/dL — AB (ref 70–99)

## 2018-05-21 MED ORDER — BLOOD GLUCOSE MONITOR KIT: PACK | 0 refills | Status: DC

## 2018-05-21 MED ORDER — ASPIRIN 81 MG PO TBEC: 81.0000 mg | DELAYED_RELEASE_TABLET | Freq: Every day | ORAL | Status: DC

## 2018-05-21 MED ORDER — GLIPIZIDE 10 MG PO TABS: 10.0000 mg | ORAL_TABLET | Freq: Two times a day (BID) | ORAL | 1 refills | Status: DC

## 2018-05-21 MED ORDER — ACETAMINOPHEN 325 MG PO TABS: 650.0000 mg | ORAL_TABLET | ORAL | Status: DC | PRN

## 2018-05-21 MED ORDER — CLOPIDOGREL BISULFATE 75 MG PO TABS: 75.0000 mg | ORAL_TABLET | Freq: Every day | ORAL | 0 refills | Status: DC

## 2018-05-21 MED ORDER — ATORVASTATIN CALCIUM 80 MG PO TABS: 80.0000 mg | ORAL_TABLET | Freq: Every day | ORAL | 1 refills | Status: DC

## 2018-05-21 NOTE — Progress Notes (Signed)
Patient was discharged from the 4W walking out with his cane. His family had all questions answered and left with all his personal belongings. Patient confirmed he will follow up with all upcoming appointments.  Doy Hutching, LPN

## 2018-05-21 NOTE — Discharge Instructions (Signed)
Inpatient Rehab Discharge Instructions  Arthur Howard Discharge date and time: No discharge date for patient encounter.   Activities/Precautions/ Functional Status: Activity: activity as tolerated Diet: diabetic diet Wound Care: none needed Functional status:  ___ No restrictions     ___ Walk up steps independently ___ 24/7 supervision/assistance   ___ Walk up steps with assistance ___ Intermittent supervision/assistance  ___ Bathe/dress independently ___ Walk with walker     _x__ Bathe/dress with assistance ___ Walk Independently    ___ Shower independently ___ Walk with assistance    ___ Shower with assistance ___ No alcohol     ___ Return to work/school ________  COMMUNITY REFERRALS UPON DISCHARGE:   Outpatient: PT     OT  Agency:  [facility]                            [address], [place], [place]  [address]                            Phone:  [phone]                             Appointment Date/Time:  Thursday, March 5 at 8am (Physical Therapy) and 8:45am (Occupational Therapy) Medical Equipment/Items Ordered:  Single point cane  Agency/Supplier:  [facility]         Phone:  [phone]  GENERAL COMMUNITY RESOURCES FOR PATIENT/FAMILY: Support Groups:  [facility] Stroke Support Group                              Meets the second Thursday of each month from 6 - 7 PM (except June, July, August)                              [facility] [place], [address], [place]                              For more information, call [phone]  Special Instructions: No smoking or smoking  Aspirin and Plavix 3 weeks then aspirin alone   STROKE/TIA DISCHARGE INSTRUCTIONS SMOKING Cigarette smoking nearly doubles your risk of having a stroke & is the single most alterable risk factor  If you smoke or have smoked in the last 12 months, you are  advised to quit smoking for your health.  Most of the excess cardiovascular risk related to smoking disappears within a year of stopping.  Ask you doctor about anti-smoking medications  [place] Quit Line: 1-800-QUIT NOW  Free Smoking Cessation Classes [phone]  CHOLESTEROL Know your levels; limit fat & cholesterol in your diet  Lipid Panel     Component Value Date/Time   CHOL 211 (H) 05/07/2018 0616   CHOL 217 (H) 05/30/2017 1157   TRIG 220 (H) 05/07/2018 0616   HDL 34 (L) 05/07/2018 0616   HDL 47 05/30/2017 1157   CHOLHDL 6.2 05/07/2018 0616   VLDL 44 (H) 05/07/2018 0616   LDLCALC 133 (H)  05/07/2018 0616   LDLCALC 112 (H) 05/30/2017 1157      Many patients benefit from treatment even if their cholesterol is at goal.  Goal: Total Cholesterol (CHOL) less than 160  Goal:  Triglycerides (TRIG) less than 150  Goal:  HDL greater than 40  Goal:  LDL (LDLCALC) less than 100   BLOOD PRESSURE American Stroke Association blood pressure target is less that 120/80 mm/Hg  Your discharge blood pressure is:  BP: 126/73  Monitor your blood pressure  Limit your salt and alcohol intake  Many individuals will require more than one medication for high blood pressure  DIABETES (A1c is a blood sugar average for last 3 months) Goal HGBA1c is under 7% (HBGA1c is blood sugar average for last 3 months)  Diabetes:     Lab Results  Component Value Date   HGBA1C 11.3 (H) 05/06/2018     Your HGBA1c can be lowered with medications, healthy diet, and exercise.  Check your blood sugar as directed by your physician  Call your physician if you experience unexplained or low blood sugars.  PHYSICAL ACTIVITY/REHABILITATION Goal is 30 minutes at least 4 days per week  Activity: Increase activity slowly, Therapies: Physical Therapy: Home Health Return to work:   Activity decreases your risk of heart attack and stroke and makes your heart stronger.  It helps control your weight and blood pressure;  helps you relax and can improve your mood.  Participate in a regular exercise program.  Talk with your doctor about the best form of exercise for you (dancing, walking, swimming, cycling).  DIET/WEIGHT Goal is to maintain a healthy weight  Your discharge diet is:  Diet Order            Diet Carb Modified Fluid consistency: Thin; Room service appropriate? Yes  Diet effective now              liquids Your height is:  Height: 5\' 8"  (172.7 cm) Your current weight is: Weight: 67.9 kg Your Body Mass Index (BMI) is:  BMI (Calculated): 22.75  Following the type of diet specifically designed for you will help prevent another stroke.  Your goal weight range is:    Your goal Body Mass Index (BMI) is 19-24.  Healthy food habits can help reduce 3 risk factors for stroke:  High cholesterol, hypertension, and excess weight.  RESOURCES Stroke/Support Group:  Call [phone]   STROKE EDUCATION PROVIDED/REVIEWED AND GIVEN TO PATIENT Stroke warning signs and symptoms How to activate emergency medical system (call 911). Medications prescribed at discharge. Need for follow-up after discharge. Personal risk factors for stroke. Pneumonia vaccine given:  Flu vaccine given:  My questions have been answered, the writing is legible, and I understand these instructions.  I will adhere to these goals & educational materials that have been provided to me after my discharge from the hospital.      My questions have been answered and I understand these instructions. I will adhere to these goals and the provided educational materials after my discharge from the hospital.  Patient/Caregiver Signature _______________________________ Date __________  Clinician Signature _______________________________________ Date __________  Please bring this form and your medication list with you to all your follow-up doctor's appointments.

## 2018-05-26 ENCOUNTER — Encounter: Payer: Self-pay | Admitting: Internal Medicine

## 2018-05-26 ENCOUNTER — Ambulatory Visit (INDEPENDENT_AMBULATORY_CARE_PROVIDER_SITE_OTHER): Payer: Commercial Indemnity | Admitting: Internal Medicine

## 2018-05-26 VITALS — BP 102/64 | HR 66 | Temp 98.0°F | Ht 65.0 in | Wt 156.2 lb

## 2018-05-26 DIAGNOSIS — E1165 Type 2 diabetes mellitus with hyperglycemia: Secondary | ICD-10-CM

## 2018-05-26 DIAGNOSIS — I69351 Hemiplegia and hemiparesis following cerebral infarction affecting right dominant side: Secondary | ICD-10-CM | POA: Diagnosis not present

## 2018-05-26 DIAGNOSIS — I6300 Cerebral infarction due to thrombosis of unspecified precerebral artery: Secondary | ICD-10-CM

## 2018-05-26 DIAGNOSIS — Z1211 Encounter for screening for malignant neoplasm of colon: Secondary | ICD-10-CM | POA: Diagnosis not present

## 2018-05-26 DIAGNOSIS — E785 Hyperlipidemia, unspecified: Secondary | ICD-10-CM

## 2018-05-26 DIAGNOSIS — Z23 Encounter for immunization: Secondary | ICD-10-CM

## 2018-05-26 DIAGNOSIS — Z716 Tobacco abuse counseling: Secondary | ICD-10-CM

## 2018-05-26 MED ORDER — METFORMIN HCL 500 MG PO TABS: 500.0000 mg | ORAL_TABLET | Freq: Two times a day (BID) | ORAL | 3 refills | Status: DC

## 2018-05-26 MED ORDER — DICLOFENAC SODIUM 1 % TD GEL: 2.0000 g | Freq: Four times a day (QID) | TRANSDERMAL | 3 refills | Status: DC

## 2018-05-26 MED FILL — DICLOFENAC SODIUM 1 % GEL: 1 | 13 days supply | Qty: 100 | Fill #0

## 2018-05-26 MED FILL — metFORMIN HCL 500 MG TABS: 500 | 90 days supply | Qty: 180 | Fill #0

## 2018-05-26 NOTE — Progress Notes (Signed)
New Patient Office Visit     CC/Reason for Visit: Establish care, ER follow up Previous PCP: [place] Last Visit: 2-3 years ago  HPI: Arthur Howard is a 53 y.o. male who is coming in today for the above mentioned reasons. He is spanish-speaking. Past Medical History is significant for: HLD and DM 2 that have not been well controlled. Recent A1c is 11.3. 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 Went to [facility] for 1 week. Will start OP PT tomorrow. He has recovered well. Still using an AFO on the right due to a foot drop. He has follow up with neurology in April. Is on DAPT for 3 weeks, then plan for ASA alone. He complains of some muscular pain in his back due to antalgic gait and would like a "cream" for it.   Past Medical/Surgical History: Past Medical History:  Diagnosis Date  . Diabetes mellitus without complication ([place])   . Hyperlipidemia     Past Surgical History:  Procedure Laterality Date  . ANKLE SURGERY      Social History:  reports that he has been smoking. He has never used smokeless tobacco. He reports that he does not drink alcohol or use drugs.  Allergies: No Known Allergies  Family History:  Family History  Problem Relation Age of Onset  . Diabetes Sister   . Diabetes Brother   . CVA Neg Hx      Current Outpatient Medications:  .  acetaminophen (TYLENOL) 325 MG tablet, Take 2 tablets (650 mg total) by mouth every 4 (four) hours as needed for mild pain (or temp > 37.5 C (99.5 F))., Disp: , Rfl:  .  aspirin EC 81 MG EC tablet, Take 1 tablet (81 mg total) by mouth daily., Disp: , Rfl:  .  atorvastatin (LIPITOR) 80 MG tablet, Take 1 tablet (80 mg total) by mouth daily., Disp: 30 tablet, Rfl: 1 .  blood glucose meter kit and supplies KIT, Dispense based on patient and insurance preference. Use up to four times daily as directed. (FOR ICD-9 250.00, 250.01)., Disp: 1 each, Rfl: 0 .  clopidogrel (PLAVIX) 75 MG tablet, Take 1 tablet (75 mg total) by mouth daily.,  Disp: 21 tablet, Rfl: 0 .  glipiZIDE (GLUCOTROL) 10 MG tablet, Take 1 tablet (10 mg total) by mouth 2 (two) times daily before a meal., Disp: 60 tablet, Rfl: 1 .  diclofenac sodium (VOLTAREN) 1 % GEL, Apply 2 g topically 4 (four) times daily., Disp: 1 Tube, Rfl: 3 .  metFORMIN (GLUCOPHAGE) 500 MG tablet, Take 1 tablet (500 mg total) by mouth 2 (two) times daily with a meal., Disp: 180 tablet, Rfl: 3  Review of Systems:  Constitutional: Denies fever, chills, diaphoresis, appetite change and fatigue.  HEENT: Denies photophobia, eye pain, redness, hearing loss, ear pain, congestion, sore throat, rhinorrhea, sneezing, mouth sores, trouble swallowing, neck pain, neck stiffness and tinnitus.   Respiratory: Denies SOB, DOE, cough, chest tightness,  and wheezing.   Cardiovascular: Denies chest pain, palpitations and leg swelling.  Gastrointestinal: Denies nausea, vomiting, abdominal pain, diarrhea, constipation, blood in stool and abdominal distention.  Genitourinary: Denies dysuria, urgency, frequency, hematuria, flank pain and difficulty urinating.  Endocrine: Denies: hot or cold intolerance, sweats, changes in hair or nails, polyuria, polydipsia. Musculoskeletal: Denies myalgias, back pain, joint swelling, arthralgias and gait problem.  Skin: Denies pallor, rash and wound.  Neurological: Denies dizziness, seizures, syncope, weakness, light-headedness, numbness and headaches.  Hematological: Denies adenopathy. Easy bruising, personal or family  bleeding history  Psychiatric/Behavioral: Denies suicidal ideation, mood changes, confusion, nervousness, sleep disturbance and agitation    Physical Exam: Vitals:   05/26/18 1301  BP: 102/64  Pulse: 66  Temp: 98 F (36.7 C)  TempSrc: Oral  SpO2: 96%  Weight: 156 lb 3.2 oz (70.9 kg)  Height: _0  (1.651 m)   Body mass index is 25.99 kg/m.  Constitutional: NAD, calm, comfortable Eyes: PERRL, lids and conjunctivae normal ENMT: Mucous membranes  are moist.  Respiratory: clear to auscultation bilaterally, no wheezing, no crackles. Normal respiratory effort. No accessory muscle use.  Cardiovascular: Regular rate and rhythm, no murmurs / rubs / gallops. No extremity edema. 2+ pedal pulses. No carotid bruits.   Musculoskeletal: right sided weakness to leg and arm Skin: no rashes, lesions, ulcers. No induration Psychiatric: Normal judgment and insight. Alert and oriented x 3. Normal mood.    Impression and Plan:  Screening for malignant neoplasm of colon  - Plan: Ambulatory referral to Gastroenterology  Cerebrovascular accident (CVA) due to thrombosis of precerebral artery ([place]) -DAPT for 3 weeks then ASA alone. -Aggressive risk factor modification. -OP PT starts tomorrow.  Encounter for smoking cessation counseling -He no longer smokes  Uncontrolled type 2 diabetes mellitus with hyperglycemia (HCC)  -Recent A1C is 11.3. -He was discharged on glipizide 10 mg. -Will start metformin 500 mg BID. -We have discussed lifestyle modifications in detail. -He will bring FBS data to next visit. -RTC in 3 months.  Hyperlipidemia, unspecified hyperlipidemia type -Continue lipitor 80 mg. -LDL in the hospital was 133; goal is <70.     Patient Instructions  Great meeting you this afternoon.  Instructions: -Schedule a colonoscopy -Flu vaccine -Schedule an eye exam with Ophthalmology each year -Start metformin 500 mg twice daily -Schedule follow up in 3 months (30 min visit)  Diabetes Mellitus and Nutrition, Adult When you have diabetes (diabetes mellitus), it is very important to have healthy eating habits because your blood sugar (glucose) levels are greatly affected by what you eat and drink. Eating healthy foods in the appropriate amounts, at about the same times every day, can help you:  Control your blood glucose.  Lower your risk of heart disease.  Improve your blood pressure.  Reach or maintain a healthy  weight. Every person with diabetes is different, and each person has different needs for a meal plan. Your health care provider may recommend that you work with a diet and nutrition specialist (dietitian) to make a meal plan that is best for you. Your meal plan may vary depending on factors such as:  The calories you need.  The medicines you take.  Your weight.  Your blood glucose, blood pressure, and cholesterol levels.  Your activity level.  Other health conditions you have, such as heart or kidney disease. How do carbohydrates affect me? Carbohydrates, also called carbs, affect your blood glucose level more than any other type of food. Eating carbs naturally raises the amount of glucose in your blood. Carb counting is a method for keeping track of how many carbs you eat. Counting carbs is important to keep your blood glucose at a healthy level, especially if you use insulin or take certain oral diabetes medicines. It is important to know how many carbs you can safely have in each meal. This is different for every person. Your dietitian can help you calculate how many carbs you should have at each meal and for each snack. Foods that contain carbs include:  Bread, cereal, rice, pasta, and  crackers.  Potatoes and corn.  Peas, beans, and lentils.  Milk and yogurt.  Fruit and juice.  Desserts, such as cakes, cookies, ice cream, and candy. How does alcohol affect me? Alcohol can cause a sudden decrease in blood glucose (hypoglycemia), especially if you use insulin or take certain oral diabetes medicines. Hypoglycemia can be a life-threatening condition. Symptoms of hypoglycemia (sleepiness, dizziness, and confusion) are similar to symptoms of having too much alcohol. If your health care provider says that alcohol is safe for you, follow these guidelines:  Limit alcohol intake to no more than 1 drink per day for nonpregnant women and 2 drinks per day for men. One drink equals 12 oz of  beer, 5 oz of wine, or 1 oz of hard liquor.  Do not drink on an empty stomach.  Keep yourself hydrated with water, diet soda, or unsweetened iced tea.  Keep in mind that regular soda, juice, and other mixers may contain a lot of sugar and must be counted as carbs. What are tips for following this plan?  Reading food labels  Start by checking the serving size on the "Nutrition Facts" label of packaged foods and drinks. The amount of calories, carbs, fats, and other nutrients listed on the label is based on one serving of the item. Many items contain more than one serving per package.  Check the total grams (g) of carbs in one serving. You can calculate the number of servings of carbs in one serving by dividing the total carbs by 15. For example, if a food has 30 g of total carbs, it would be equal to 2 servings of carbs.  Check the number of grams (g) of saturated and trans fats in one serving. Choose foods that have low or no amount of these fats.  Check the number of milligrams (mg) of salt (sodium) in one serving. Most people should limit total sodium intake to less than 2,300 mg per day.  Always check the nutrition information of foods labeled as "low-fat" or "nonfat". These foods may be higher in added sugar or refined carbs and should be avoided.  Talk to your dietitian to identify your daily goals for nutrients listed on the label. Shopping  Avoid buying canned, premade, or processed foods. These foods tend to be high in fat, sodium, and added sugar.  Shop around the outside edge of the grocery store. This includes fresh fruits and vegetables, bulk grains, fresh meats, and fresh dairy. Cooking  Use low-heat cooking methods, such as baking, instead of high-heat cooking methods like deep frying.  Cook using healthy oils, such as olive, canola, or sunflower oil.  Avoid cooking with butter, cream, or high-fat meats. Meal planning  Eat meals and snacks regularly, preferably at  the same times every day. Avoid going long periods of time without eating.  Eat foods high in fiber, such as fresh fruits, vegetables, beans, and whole grains. Talk to your dietitian about how many servings of carbs you can eat at each meal.  Eat 4-6 ounces (oz) of lean protein each day, such as lean meat, chicken, fish, eggs, or tofu. One oz of lean protein is equal to: ? 1 oz of meat, chicken, or fish. ? 1 egg. ?  cup of tofu.  Eat some foods each day that contain healthy fats, such as avocado, nuts, seeds, and fish. Lifestyle  Check your blood glucose regularly.  Exercise regularly as told by your health care provider. This may include: ? 150 minutes of  moderate-intensity or vigorous-intensity exercise each week. This could be brisk walking, biking, or water aerobics. ? Stretching and doing strength exercises, such as yoga or weightlifting, at least 2 times a week.  Take medicines as told by your health care provider.  Do not use any products that contain nicotine or tobacco, such as cigarettes and e-cigarettes. If you need help quitting, ask your health care provider.  Work with a Social worker or diabetes educator to identify strategies to manage stress and any emotional and social challenges. Questions to ask a health care provider  Do I need to meet with a diabetes educator?  Do I need to meet with a dietitian?  What number can I call if I have questions?  When are the best times to check my blood glucose? Where to find more information:  American Diabetes Association: diabetes.org  Academy of Nutrition and Dietetics: www.eatright.[employer] of Diabetes and Digestive and Kidney Diseases (NIH): [employer] Summary  A healthy meal plan will help you control your blood glucose and maintain a healthy lifestyle.  Working with a diet and nutrition specialist (dietitian) can help you make a meal plan that is best for you.  Keep in mind that carbohydrates  (carbs) and alcohol have immediate effects on your blood glucose levels. It is important to count carbs and to use alcohol carefully. This information is not intended to replace advice given to you by your health care provider. Make sure you discuss any questions you have with your health care provider. Document Released: 12/05/2004 Document Revised: 10/08/2016 Document Reviewed: 04/14/2016 Elsevier Interactive Patient Education  2019 [place], [place] [facility]

## 2018-05-26 NOTE — Patient Instructions (Addendum)
Great meeting you this afternoon.  Instructions: -Schedule a colonoscopy -Flu vaccine -Schedule an eye exam with Ophthalmology each year -Start metformin 500 mg twice daily -Schedule follow up in 3 months (30 min visit)  Diabetes Mellitus and Nutrition, Adult When you have diabetes (diabetes mellitus), it is very important to have healthy eating habits because your blood sugar (glucose) levels are greatly affected by what you eat and drink. Eating healthy foods in the appropriate amounts, at about the same times every day, can help you:  Control your blood glucose.  Lower your risk of heart disease.  Improve your blood pressure.  Reach or maintain a healthy weight. Every person with diabetes is different, and each person has different needs for a meal plan. Your health care provider may recommend that you work with a diet and nutrition specialist (dietitian) to make a meal plan that is best for you. Your meal plan may vary depending on factors such as:  The calories you need.  The medicines you take.  Your weight.  Your blood glucose, blood pressure, and cholesterol levels.  Your activity level.  Other health conditions you have, such as heart or kidney disease. How do carbohydrates affect me? Carbohydrates, also called carbs, affect your blood glucose level more than any other type of food. Eating carbs naturally raises the amount of glucose in your blood. Carb counting is a method for keeping track of how many carbs you eat. Counting carbs is important to keep your blood glucose at a healthy level, especially if you use insulin or take certain oral diabetes medicines. It is important to know how many carbs you can safely have in each meal. This is different for every person. Your dietitian can help you calculate how many carbs you should have at each meal and for each snack. Foods that contain carbs include:  Bread, cereal, rice, pasta, and crackers.  Potatoes and  corn.  Peas, beans, and lentils.  Milk and yogurt.  Fruit and juice.  Desserts, such as cakes, cookies, ice cream, and candy. How does alcohol affect me? Alcohol can cause a sudden decrease in blood glucose (hypoglycemia), especially if you use insulin or take certain oral diabetes medicines. Hypoglycemia can be a life-threatening condition. Symptoms of hypoglycemia (sleepiness, dizziness, and confusion) are similar to symptoms of having too much alcohol. If your health care provider says that alcohol is safe for you, follow these guidelines:  Limit alcohol intake to no more than 1 drink per day for nonpregnant women and 2 drinks per day for men. One drink equals 12 oz of beer, 5 oz of wine, or 1 oz of hard liquor.  Do not drink on an empty stomach.  Keep yourself hydrated with water, diet soda, or unsweetened iced tea.  Keep in mind that regular soda, juice, and other mixers may contain a lot of sugar and must be counted as carbs. What are tips for following this plan?  Reading food labels  Start by checking the serving size on the "Nutrition Facts" label of packaged foods and drinks. The amount of calories, carbs, fats, and other nutrients listed on the label is based on one serving of the item. Many items contain more than one serving per package.  Check the total grams (g) of carbs in one serving. You can calculate the number of servings of carbs in one serving by dividing the total carbs by 15. For example, if a food has 30 g of total carbs, it would be equal  to 2 servings of carbs.  Check the number of grams (g) of saturated and trans fats in one serving. Choose foods that have low or no amount of these fats.  Check the number of milligrams (mg) of salt (sodium) in one serving. Most people should limit total sodium intake to less than 2,300 mg per day.  Always check the nutrition information of foods labeled as "low-fat" or "nonfat". These foods may be higher in added sugar or  refined carbs and should be avoided.  Talk to your dietitian to identify your daily goals for nutrients listed on the label. Shopping  Avoid buying canned, premade, or processed foods. These foods tend to be high in fat, sodium, and added sugar.  Shop around the outside edge of the grocery store. This includes fresh fruits and vegetables, bulk grains, fresh meats, and fresh dairy. Cooking  Use low-heat cooking methods, such as baking, instead of high-heat cooking methods like deep frying.  Cook using healthy oils, such as olive, canola, or sunflower oil.  Avoid cooking with butter, cream, or high-fat meats. Meal planning  Eat meals and snacks regularly, preferably at the same times every day. Avoid going long periods of time without eating.  Eat foods high in fiber, such as fresh fruits, vegetables, beans, and whole grains. Talk to your dietitian about how many servings of carbs you can eat at each meal.  Eat 4-6 ounces (oz) of lean protein each day, such as lean meat, chicken, fish, eggs, or tofu. One oz of lean protein is equal to: ? 1 oz of meat, chicken, or fish. ? 1 egg. ?  cup of tofu.  Eat some foods each day that contain healthy fats, such as avocado, nuts, seeds, and fish. Lifestyle  Check your blood glucose regularly.  Exercise regularly as told by your health care provider. This may include: ? 150 minutes of moderate-intensity or vigorous-intensity exercise each week. This could be brisk walking, biking, or water aerobics. ? Stretching and doing strength exercises, such as yoga or weightlifting, at least 2 times a week.  Take medicines as told by your health care provider.  Do not use any products that contain nicotine or tobacco, such as cigarettes and e-cigarettes. If you need help quitting, ask your health care provider.  Work with a Social worker or diabetes educator to identify strategies to manage stress and any emotional and social challenges. Questions to ask a  health care provider  Do I need to meet with a diabetes educator?  Do I need to meet with a dietitian?  What number can I call if I have questions?  When are the best times to check my blood glucose? Where to find more information:  American Diabetes Association: diabetes.org  Academy of Nutrition and Dietetics: www.eatright.[employer] of Diabetes and Digestive and Kidney Diseases (NIH): [employer] Summary  A healthy meal plan will help you control your blood glucose and maintain a healthy lifestyle.  Working with a diet and nutrition specialist (dietitian) can help you make a meal plan that is best for you.  Keep in mind that carbohydrates (carbs) and alcohol have immediate effects on your blood glucose levels. It is important to count carbs and to use alcohol carefully. This information is not intended to replace advice given to you by your health care provider. Make sure you discuss any questions you have with your health care provider. Document Released: 12/05/2004 Document Revised: 10/08/2016 Document Reviewed: 04/14/2016 Elsevier Interactive Patient Education  2019 [employer]

## 2018-05-27 ENCOUNTER — Ambulatory Visit: Payer: Commercial Indemnity | Attending: Physician Assistant | Admitting: Physical Therapy

## 2018-05-27 ENCOUNTER — Ambulatory Visit: Payer: Commercial Indemnity | Admitting: *Deleted

## 2018-05-27 ENCOUNTER — Ambulatory Visit: Payer: Commercial Indemnity | Admitting: Occupational Therapy

## 2018-05-27 ENCOUNTER — Other Ambulatory Visit: Payer: Self-pay

## 2018-05-27 DIAGNOSIS — M6281 Muscle weakness (generalized): Secondary | ICD-10-CM | POA: Diagnosis present

## 2018-05-27 DIAGNOSIS — R2689 Other abnormalities of gait and mobility: Secondary | ICD-10-CM | POA: Insufficient documentation

## 2018-05-27 DIAGNOSIS — R2681 Unsteadiness on feet: Secondary | ICD-10-CM | POA: Insufficient documentation

## 2018-05-27 DIAGNOSIS — R278 Other lack of coordination: Secondary | ICD-10-CM

## 2018-05-27 DIAGNOSIS — I69351 Hemiplegia and hemiparesis following cerebral infarction affecting right dominant side: Secondary | ICD-10-CM

## 2018-05-27 DIAGNOSIS — R293 Abnormal posture: Secondary | ICD-10-CM | POA: Diagnosis present

## 2018-05-27 NOTE — Patient Instructions (Addendum)
Gastroc, Sitting (Passive)    Sit with strap or towel around ball of foot. Gently pull toward body  You should feel a stretch in the back of your lower leg. Hold _30__ seconds.  Repeat __3_ times per session. Do _2_ sessions per day.  Copyright  VHI. All rights reserved.

## 2018-05-27 NOTE — Therapy (Signed)
[facility] [address] [place], [place], [address] Phone: [phone]   Fax:  [phone]  Physical Therapy Evaluation  Patient Details  Name: Arthur Howard MRN: [id number] Date of Birth: 1965/06/22 Referring Provider (PT): Arthur Howard (from hospital-f/u to be with Arthur Barge, NP)   Encounter Date: 05/27/2018  PT End of Session - 05/27/18 1208    Visit Number  1    Number of Visits  22    Date for PT Re-Evaluation  08/25/18    Authorization Type  [employer]-60 visits combined PT and OT if on same day    Authorization - Visit Number  1    Authorization - Number of Visits  60    PT Start Time  0803    PT Stop Time  6962    PT Time Calculation (min)  52 min    Equipment Utilized During Treatment  Gait belt    Activity Tolerance  Patient tolerated treatment well    Behavior During Therapy  [name] for tasks assessed/performed       Past Medical History:  Diagnosis Date  . Diabetes mellitus without complication ([place])   . Hyperlipidemia     Past Surgical History:  Procedure Laterality Date  . ANKLE SURGERY      There were no vitals filed for this visit.   Subjective Assessment - 05/27/18 0813    Subjective  Pt had stroke 05/06/18, affecting R side; was discharged 05/21/2018.  Want to recover strength hand, arm, leg.  Feel like balance has been affected, but getting better.    Patient is accompained by:  Family member   daughters   Patient Stated Goals  Pt's goal is to improve R hand, arm, leg strength.    Currently in Pain?  Yes    Pain Score  5     Pain Location  --   Trunk   Pain Orientation  Right    Pain Descriptors / Indicators  Sharp;Aching    Pain Type  Acute pain    Pain Onset  1 to 4 weeks ago    Pain Frequency  Intermittent    Aggravating Factors   lifting and using R arm    Pain Relieving Factors  goes away on its own         [facility] PT Assessment - 05/27/18 0817      Assessment    Medical Diagnosis  CVA-R sided weakness    Referring Provider (PT)  Arthur Howard   from hospital-f/u to be with Arthur Barge, NP   Onset Date/Surgical Date  05/06/18    Hand Dominance  Right      Precautions   Precautions  Fall       Precaution Comments  No driving, use cane outdoors      Balance Screen   Has the patient fallen in the past 6 months  No    Has the patient had a decrease in activity level because of a fear of falling?   No    Is the patient reluctant to leave their home because of a fear of falling?   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, Curator soccer, walking the neighborhood, yardwork      Observation/Other Assessments   Focus on Therapeutic Outcomes (FOTO)   Functional Intake Status:  67      Posture/Postural Control   Posture/Postural Control  Postural limitations    Postural Limitations  Rounded Shoulders;Posterior pelvic tilt   rounded trunk, posterior lean with MMT   Posture Comments  difficulty dissociating trunk with RUE movement, decreased runk rotation      ROM / Strength   AROM / PROM / Strength  Strength      Strength   Overall Strength  Deficits    Strength Assessment Site  Hip;Knee;Ankle    Right/Left Hip  Right;Left    Right Hip Flexion  4/5    Left Hip Flexion  5/5    Right/Left Knee  Right;Left    Right Knee Flexion  3-/5    Right Knee Extension  3-/5    Left Knee Flexion  5/5    Left Knee Extension  5/5    Right/Left Ankle  Right;Left      Transfers   Transfers  Sit  to Stand;Stand to Sit    Sit to Stand  5: Supervision;Without upper extremity assist;From chair/3-in-1    Five time sit to stand comments   14.97    Stand to Sit  5: Supervision;Without upper extremity assist;To chair/3-in-1      Ambulation/Gait   Ambulation/Gait  Yes    Ambulation/Gait Assistance  5: Supervision    Ambulation Distance (Feet)  200 Feet    Assistive device  None   brings in [facility], but requests not to use; AFO   Gait Pattern  Step-through pattern;Decreased arm swing - right;Decreased step length - right;Decreased stance time - right;Decreased hip/knee flexion - right;Decreased dorsiflexion - right;Decreased trunk rotation   RLE external rotation   Ambulation Surface  Level;Indoor    Gait velocity  10.47 sec = 3.13 ft/sec      Standardized Balance Assessment   Standardized Balance Assessment  Timed Up and Go Test;Dynamic Gait Index      Dynamic Gait Index   Level Surface  Mild Impairment    Change in Gait Speed  Mild Impairment    Gait with Horizontal Head Turns  Mild Impairment    Gait with Vertical Head Turns  Mild Impairment    Gait and Pivot Turn  Moderate Impairment    Step Over Obstacle  Mild Impairment    Step Around Obstacles  Mild Impairment    Steps  Moderate Impairment    Total Score  14    DGI comment:  Scores <19/24 indicate increased fall risk.      Timed Up and Go Test   Normal TUG (seconds)  14.32    TUG Comments  Scores >13.5 sec indicate increased fall risk.                Objective measurements completed on examination: See above findings.              PT Education - 05/27/18 1105    Education Details  Initiate HEP-see instructions    Person(s) Educated  Patient;Child(ren)    Methods  Explanation;Demonstration;Handout    Comprehension  Verbalized understanding;Returned  demonstration;Verbal cues required       PT Short Term Goals - 05/27/18 1218      PT SHORT TERM GOAL #1   Title  Pt will be independent with HEP for  improved transfers, balance, gait and strength.  TARGET 06/25/2018    Time  5    Period  Weeks    Status  New    Target Date  06/25/18      PT SHORT TERM GOAL #2   Title  Pt will improve 5x sit<>stand to less than or equal to 12.5 seconds for improved transfer efficiency and lower extremity strength.    Time  5    Period  Weeks    Status  New    Target Date  06/25/18      PT SHORT TERM GOAL #3   Title  Pt will improve TUG score to less than or equal to 13.5 sec for decreased fall risk.    Time  5    Period  Weeks    Status  New    Target Date  06/25/18      PT SHORT TERM GOAL #4   Title  Pt will improve DGI score to at least 17/24 for decreased fall risk.    Time  5    Period  Weeks    Status  New    Target Date  06/25/18      PT SHORT TERM GOAL #5   Title  Pt will negotiate 4 steps with alternating pattern, 1 rail modified independently, for preparation for return to work activities.    Time  5    Period  Weeks    Status  New    Target Date  06/25/18      Additional Short Term Goals   Additional Short Term Goals  Yes      PT SHORT TERM GOAL #6   Title  Pt will verbalize understanding of fall prevention in home environment.    Time  5    Period  Weeks    Target Date  06/25/18        PT Long Term Goals - 05/27/18 1221      PT LONG TERM GOAL #1   Title  Pt will be independent with progressive HEP for strength, balance, and gait.  TARGET 07/23/2018    Time  9    Period  Weeks    Status  New    Target Date  07/23/18      PT LONG TERM GOAL #2   Title  Pt will perform 8 of 10 reps from surfaces <18" with no UE support, for improved transfer efficeincy and improved lower extremity strength.    Time  9    Period  Weeks    Status  New    Target Date  07/23/18      PT LONG TERM GOAL #3   Title  Pt will improve DGI score to at least 19/24 for decreased fall risk.    Time  9    Period  Weeks    Status  New    Target Date  07/23/18      PT LONG TERM GOAL #4    Title  Pt will ambulate at least 1000 ft, independently, no LOB, indoor and outdoor surfaces, for improved community gait.    Time  9    Period  Weeks    Status  New    Target Date  07/23/18  PT LONG TERM GOAL #5   Title  Pt will perform floor>stand transfer independently, for safe fall recovery/floor activities/return to work.    Time  9    Period  Weeks    Status  New    Target Date  07/23/18             Plan - 05/27/18 1210    Clinical Impression Statement  Pt is a 53 year old male who presents to OPPT status post CVA and R sided weakness.  CVA was 05/06/2018; prior to CVA, pt was independent and working.  Pt presents to OPPT with decreased strength, decreased timing and coordination with gait, decreased balance, decreased gait independence, abnormal tone and posture.  Pt is at fall risk per TUG and DGI scores.  He would benefit from skilled PT to address the above stated deficits to decrease fall risk and improve functional mobility/return to independence.    Personal Factors and Comorbidities  Comorbidity 2;Past/Current Experience   Independent and active prior to CVA   Comorbidities  hyperlipidemia, DM    Examination-Activity Limitations  Carry;Stairs;Locomotion Level;Transfers    Examination-Participation Restrictions  Community Activity;[employer], Work   Public affairs consultant  Moderate    Rehab Potential  Good    PT Frequency  Other (comment)   2x/wk for eval week; then 3x/wk for 4 weeks, 2x/wk 4 weeks   PT Duration  Other (comment)   total = 9 weeks POC   PT Treatment/Interventions  ADLs/Self Care Home Management;Electrical Stimulation;DME Instruction;Gait training;Stair training;Functional mobility training;Therapeutic activities;Therapeutic exercise;Orthotic Fit/Training;Patient/family education;Neuromuscular re-education;Balance training;Manual techniques    PT Next Visit Plan   Review gastroc stretch, initiate hamstring, quad strengthening, sit<>stand for strengthening; NMR to RLE and trunk; gait training (hopefull, will get on Audra/Kathy for [place])    Consulted and Agree with Plan of Care  Patient;Family member/caregiver    Family Member Consulted  Daughters       Patient will benefit from skilled therapeutic intervention in order to improve the following deficits and impairments:  Abnormal gait, Decreased balance, Decreased mobility, Difficulty walking, Decreased strength, Impaired flexibility, Impaired tone, Postural dysfunction  Visit Diagnosis: Other abnormalities of gait and mobility  Muscle weakness (generalized)  Unsteadiness on feet  Abnormal posture     Problem List Patient Active Problem List   Diagnosis Date Noted  . Left pontine cerebrovascular accident ([place]) 05/11/2018  . Tobacco abuse   . Uncontrolled type 2 diabetes mellitus with hyperglycemia ([place])   . CVA (cerebral vascular accident) ([place]) 05/06/2018  . Hyperlipidemia 05/30/2017    MARRIOTT,AMY W. 05/27/2018, 12:25 PM  Frazier Butt., PT  [facility] [address] [place], [place], [address] Phone: [phone]   Fax:  [phone]  Name: Arthur Howard MRN: [id number] Date of Birth: 12/12/1965

## 2018-05-27 NOTE — Therapy (Addendum)
[facility] [address] [place], [place], [address] Phone: [phone]   Fax:  [phone]  Occupational Therapy Evaluation  Patient Details  Name: Arthur Howard MRN: [id number] Date of Birth: September 25, 1965 No data recorded  Encounter Date: 05/27/2018  OT End of Session - 05/27/18 1304    Visit Number  1    Number of Visits  33    Date for OT Re-Evaluation  08/25/18    Authorization Type  [employer]    Authorization Time Period  12 weeks, 60 visit limit, counts as 1 visit if PT/OT same day   Authorization - Visit Number  1    Authorization - Number of Visits  64    OT Start Time  1105    OT Stop Time  1145    OT Time Calculation (min)  40 min    Activity Tolerance  Patient tolerated treatment well    Behavior During Therapy  WFL for tasks assessed/performed       Past Medical History:  Diagnosis Date  . Diabetes mellitus without complication ([place])   . Hyperlipidemia     Past Surgical History:  Procedure Laterality Date  . ANKLE SURGERY      There were no vitals filed for this visit.  Subjective Assessment - 05/27/18 1108    Subjective   Pt reports he wants to regain use of RUE    Pertinent History  CVA, DM, hyperlipidemia    Limitations  non English speaking    Patient Stated Goals  recover use of RUE    Currently in Pain?  Yes    Pain Score  2     Pain Location  --   trunk   Pain Orientation  Right    Pain Descriptors / Indicators  Sharp;Aching    Pain Type  Acute pain    Pain Onset  1 to 4 weeks ago    Pain Frequency  Intermittent    Aggravating Factors   malpostioning    Pain Relieving Factors  repostioning        [facility] OT Assessment - 05/27/18 1110      Assessment   Medical Diagnosis  CVA-R sided weakness    Onset Date/Surgical Date  05/06/18    Hand Dominance  Right      Precautions   Precautions  Fall    Precaution Comments  No driving, use cane outdoors      Balance Screen   Has the  patient fallen in the past 6 months  No    Has the patient had a decrease in activity level because of a fear of falling?   No    Is the patient reluctant to leave their home because of a fear of falling?   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, Curator soccer, walking the neighborhood, yardwork      ADL   Eating/Feeding  Minimal assistance   needs assist for cutting food    Grooming  Set up  Upper Body Bathing  Supervision/safety    Lower Body Bathing  Supervision/safety    Upper Body Dressing  Set up    Lower Body Dressing  Minimal assistance    Toilet Transfer  Modified independent    Tub/Shower Transfer  Supervision/safety      IADL   Light Housekeeping  Performs light daily tasks but cannot maintain acceptable level of cleanliness    Meal Prep  Able to complete simple cold meal and snack prep   with supervision   Medication Management  Takes responsibility if medication is prepared in advance in seperate dosage      Mobility   Mobility Status  --   supervision     Written Expression   Dominant Hand  Right    Handwriting  50% legible      Vision - History   Patient Visual Report  --   denies changes     Vision Assessment   Vision Assessment  Vision not tested   denies changes     Cognition   Overall Cognitive Status  Within Functional Limits for tasks assessed      Observation/Other Assessments   Focus on Therapeutic Outcomes (FOTO)   Functional Intake Status:  67      Posture/Postural Control   Posture/Postural Control  Postural limitations    Postural Limitations  Rounded Shoulders;Posterior pelvic  tilt      Coordination   Gross Motor Movements are Fluid and Coordinated  No    Fine Motor Movements are Fluid and Coordinated  No    9 Hole Peg Test  Right;Left    Right 9 Hole Peg Test  45 secs    Left 9 Hole Peg Test  25.19 secs    Box and Blocks  RUE 48 blocks, LUE 75 blocks      ROM / Strength   AROM / PROM / Strength  AROM      AROM   Overall AROM   Deficits    Overall AROM Comments  RUE shoulder flexion 120 with mod compensations, elbow extension -15, supination/ pronation 90%, wrist extension 70%, wrist flexion 50%, unable to oppose 5th digit, able to make full fist - Pt demonstrates decreased overall RUE control, scapular elevation noted     Hand Function   Right Hand Grip (lbs)  25 lbs    Left Hand Grip (lbs)  71 lbs                        OT Short Term Goals - 05/27/18 1319      OT SHORT TERM GOAL #1   Title  I with inital HEP.    Time  6    Period  Weeks    Status  New    Target Date  07/11/18      OT SHORT TERM GOAL #2   Title  Pt will demonstrate  improved RUE functional use as evidenced by increasing box/ blocks score by 8 blocks.    Baseline  RUE 48, LUE 75    Time  6    Period  Weeks      OT SHORT TERM GOAL #3   Title  Pt will demonstrate ability to retrieve a light weight object from overhead shelf at 120 shoulder flexion with no more than min compensations.    Time  6    Period  Weeks    Status  New      OT SHORT TERM  GOAL #4   Title  Pt will perfrom all basic ADLS modified independently.    Time  6    Period  Weeks      OT SHORT TERM GOAL #5   Title  Pt will increase RUE grip strength to 35 lbs of greater for increased functional use.    Time  6    Period  Weeks    Status  New      Additional Short Term Goals   Additional Short Term Goals  Yes      OT SHORT TERM GOAL #6   Title  Pt will report using his RUE to assist with ADLs at least 50% of the time or greater.    Time  12    Period  Weeks    Status  New         OT Long Term Goals - 05/27/18 1326      OT LONG TERM GOAL #1   Title  Pt will demonstrate ability to retrieve a 3 lbs weight from overhead shelf demonstrating good control with RUE.    Time  12    Period  Weeks    Status  New    Target Date  08/25/18      OT LONG TERM GOAL #2   Title  Pt will perform mod complex home managment modified independently.    Time  12    Period  Weeks    Status  New      OT LONG TERM GOAL #3   Title  Pt will demonstrate improved RUE fine motor coordination for ADLS as evidenced by completing 9 hole peg test in 38 secs or less.    Time  12    Period  Weeks    Status  New      OT LONG TERM GOAL #4   Title  Pt will report that he has returned to using his RUE as dominant hand at least 75% of the time for ADLs/IADLs..    Time  12    Period  Weeks    Status  New      OT LONG TERM GOAL #5   Title  Pt will demonstrate ability to write his name and address legibily using RUE.    Time  12    Period  Weeks    Status  New      Long Term Additional Goals   Additional Long Term Goals  Yes      OT LONG TERM GOAL #6   Title  Pt will perform simulated work activities modified indpendently demonstrating good safety awareness.    Time  12    Period  Weeks    Status  New            Plan - 05/27/18 1306    Clinical Impression Statement  Patient is a 53 y.o. right handed non-English-speaking male with history of tobacco abuse, diabetes mellitus, hyperlipidemia. Per chart review and daughter, patient lives with wife and daughter. He is employed in Architect. Pt. presented to ED  04/26/2018 with right sided weakness and facial droop as well as some slurred speech. Cranial CT scan showed small age uncertain infarct involving a portion of the genuine and posterior limb of right internal capsule. Urine drug screen negative. CT angiogram of head and neck negative. Patient did not receive TPA. 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    Patient transferred to CIR on 05/11/2018 - d/c home  05/21/2018. Pt presents wth the following deficits: R hemiplegia, decreased balance, decreased coordination decreased RUE functional use, which impedes pefromance of daily activities. Pt can beneft from skilled occupational therapy to maximize pt safety and independence with ADLs/IADLS.    OT Occupational Profile and History  Detailed Assessment- Review of Records and additional review of physical, cognitive, psychosocial history related to current functional performance    Occupational performance deficits (Please refer to evaluation for details):  ADL's;IADL's;Work;Leisure;Social Participation    Body Structure / Function / Physical Skills  ADL;Dexterity;Flexibility;ROM;Strength;IADL;FMC;Coordination;Balance;UE functional use;GMC;Gait;Endurance;Decreased knowledge of precautions;Sensation    Rehab Potential  Good    Clinical Decision Making  Limited treatment options, no task modification necessary    Comorbidities Affecting Occupational Performance:  May have comorbidities impacting occupational performance    Modification or Assistance to Complete Evaluation   No modification of tasks or assist necessary to complete eval   required interpreter due to language barrier   OT Frequency  3x / week    OT Duration  8 weeks   followed by 2x week x 4 weeks.   OT Treatment/Interventions  Self-care/ADL training;Cryotherapy;Paraffin;Therapeutic exercise;DME and/or AE instruction;Functional Mobility Training;Balance training;Splinting;Manual Therapy;Neuromuscular education;Fluidtherapy;Ultrasound;Aquatic Therapy;Electrical Stimulation;Moist Heat;Contrast Bath;Energy conservation;Passive range of motion;Therapeutic activities;Patient/family education    Plan  initiate HEP    Consulted and Agree with Plan of Care  Patient;Family member/caregiver    Family Member Consulted  dtr who interprets for him       Patient will benefit from skilled therapeutic  intervention in order to improve the following deficits and impairments:  Body Structure / Function / Physical Skills  Visit Diagnosis: Hemiplegia and hemiparesis following cerebral infarction affecting right dominant side ([place]) - Plan: Ot plan of care cert/re-cert  Other abnormalities of gait and mobility - Plan: Ot plan of care cert/re-cert  Muscle weakness (generalized) - Plan: Ot plan of care cert/re-cert  Other lack of coordination - Plan: Ot plan of care cert/re-cert    Problem List Patient Active Problem List   Diagnosis Date Noted  . Left pontine cerebrovascular accident ([place]) 05/11/2018  . Tobacco abuse   . Uncontrolled type 2 diabetes mellitus with hyperglycemia ([place])   . CVA (cerebral vascular accident) ([place]) 05/06/2018  . Hyperlipidemia 05/30/2017    Campbell Kray 05/27/2018, 2:14 PM Theone Murdoch, OTR/L [phone] Phone: [phone] 2:14 PM 05/27/18 [facility] [address] [place], [place], [address] Phone: [phone]   Fax:  [phone]  Name: Arthur Howard MRN: [id number] Date of Birth: 02/26/1966

## 2018-05-28 ENCOUNTER — Ambulatory Visit: Payer: Commercial Indemnity | Admitting: Occupational Therapy

## 2018-05-28 ENCOUNTER — Ambulatory Visit: Payer: Commercial Indemnity | Admitting: Physical Therapy

## 2018-05-31 ENCOUNTER — Encounter: Payer: Commercial Indemnity | Attending: Registered Nurse | Admitting: Registered Nurse

## 2018-05-31 ENCOUNTER — Encounter: Payer: Self-pay | Admitting: Registered Nurse

## 2018-05-31 VITALS — BP 125/69 | HR 66 | Ht 65.0 in | Wt 154.0 lb

## 2018-05-31 DIAGNOSIS — I6350 Cerebral infarction due to unspecified occlusion or stenosis of unspecified cerebral artery: Secondary | ICD-10-CM | POA: Diagnosis present

## 2018-05-31 DIAGNOSIS — E1165 Type 2 diabetes mellitus with hyperglycemia: Secondary | ICD-10-CM | POA: Diagnosis not present

## 2018-05-31 DIAGNOSIS — E7849 Other hyperlipidemia: Secondary | ICD-10-CM | POA: Insufficient documentation

## 2018-05-31 DIAGNOSIS — I639 Cerebral infarction, unspecified: Secondary | ICD-10-CM

## 2018-05-31 NOTE — Progress Notes (Signed)
Subjective:    Patient ID: Arthur Howard, male    DOB: 1965/07/01, 53 y.o.   MRN: [id number]  [id number] Arthur Howard is a 53 y.o. male who is here for hospital  follow up appointment. He presented to [facility] emergency department on 05/06/2018 presented with right sided weakness and  Numbness also with facial droop.  CT Head WO Contrast Ordered:   IMPRESSION: Small age uncertain infarct involving a portion of the genu and posterior limb of the right internal capsule. Brain parenchyma elsewhere appears unremarkable. No evident mass or hemorrhage. There are foci of arterial vascular calcification. There are foci of paranasal sinus disease. CT Angio Neck W or WO Contrast  IMPRESSION: Normal examination.  No brachiocephalic vascular disease.  MR Brain WO Contrast IMPRESSION: Small acute infarction of the left inferior ventral pons. No sign of hemorrhage or mass effect.  Minimal chronic small-vessel change of the hemispheric white matterelsewhere as described above. Small old lacunar infarction right basal ganglia/radiating white matter tracts. Neurology was consulted he will be maintained on aspirin and plavix for 3 weeks , then aspirin alone.   He was admitted to [facility] on 02/18/ 2020 and discharged home on 05/21/2018. He is scheduled for Outpatient rehabilitation at [facility].   He reports he has pain in his left shoulder. He rated his pain on flow-sheet 0. Reports he has a good appetite.   Daughter Arthur Howard in the room, all questions answered.    Pain Inventory Average Pain 4 Pain Right Now 0 My pain is sharp  In the last 24 hours, has pain interfered with the following? General activity 4 Relation with others 0 Enjoyment of life 0 What TIME of day is your pain at its worst? daytime Sleep (in general) Good  Pain is worse with: inactivity and some activites Pain improves with: medication Relief from Meds: 5  Mobility walk  without assistance ability to climb steps?  yes do you drive?  no  Function not employed: date last employed 2020  Neuro/Psych trouble walking  Prior Studies Any changes since last visit?  no  Physicians involved in your care Any changes since last visit?  no   Family History  Problem Relation Age of Onset  . Diabetes Sister   . Diabetes Brother   . CVA Neg Hx    Social History   Socioeconomic History  . Marital status: Married    Spouse name: Not on file  . Number of children: Not on file  . Years of education: Not on file  . Highest education level: Not on file  Occupational History  . Occupation: Sales executive    Comment: [employer]  Social Needs  . Financial resource strain: Not on file  . Food insecurity:    Worry: Not on file    Inability: Not on file  . Transportation needs:    Medical: Not on file    Non-medical: Not on file  Tobacco Use  . Smoking status: Former Research scientist (life sciences)  . Smokeless tobacco: Never Used  . Tobacco comment: never a daily smoker  Substance and Sexual Activity  . Alcohol use: No    Alcohol/week: 0.0 standard drinks  . Drug use: No  . Sexual activity: Not on file  Lifestyle  . Physical activity:    Days per week: Not on file    Minutes per session: Not on file  . Stress: Not on file  Relationships  . Social connections:    Talks on phone: Not  on file    Gets together: Not on file    Attends religious service: Not on file    Active member of club or organization: Not on file    Attends meetings of clubs or organizations: Not on file    Relationship status: Not on file  Other Topics Concern  . Not on file  Social History Narrative   Works with Sales executive. Lives in [place]. Moved from [place] 35 years ago. He is married. Has 7 children (all girls).    Past Surgical History:  Procedure Laterality Date  . ANKLE SURGERY     Past Medical History:  Diagnosis Date  . Diabetes mellitus without complication ([place])   . Hyperlipidemia    Ht  5\' 5"  (1.651 Arthur)   Wt 154 lb (69.9 kg)   BMI 25.63 kg/Arthur   Opioid Risk Score:   Fall Risk Score:  `1  Depression screen PHQ 2/9  Depression screen [facility] 2/9 05/26/2018 05/30/2017 09/20/2016 09/18/2016 03/04/2016 11/10/2015  Decreased Interest 0 0 0 0 0 0  Down, Depressed, Hopeless 0 0 0 0 0 0  PHQ - 2 Score 0 0 0 0 0 0     Review of Systems  Constitutional: Negative.   HENT: Negative.   Eyes: Negative.   Respiratory: Negative.   Cardiovascular: Negative.   Gastrointestinal: Negative.   Endocrine: Negative.   Genitourinary: Negative.   Musculoskeletal: Positive for arthralgias, gait problem and myalgias.  Skin: Negative.   Allergic/Immunologic: Negative.   Hematological: Negative.   Psychiatric/Behavioral: Negative.   All other systems reviewed and are negative.      Objective:   Physical Exam Constitutional:      Appearance: Normal appearance.  Neck:     Musculoskeletal: Normal range of motion and neck supple.  Cardiovascular:     Rate and Rhythm: Normal rate and regular rhythm.     Pulses: Normal pulses.     Heart sounds: Normal heart sounds.  Pulmonary:     Effort: Pulmonary effort is normal.     Breath sounds: Normal breath sounds.  Musculoskeletal:     Comments: Normal Muscle Bulk and Muscle Testing Reveals:  Upper Extremities: FullROM and Muscle Strength on the right 4/5 ad Left 5/5 Lower Extremities: Full ROM and Muscle Strength 5/5 Lower Extremities: Full ROM and Muscle Strength 5/5 Arises from Table with ease Narrow Based Gait   Skin:    General: Skin is warm and dry.  Neurological:     Mental Status: He is alert and oriented to person, place, and time.  Psychiatric:        Mood and Affect: Mood normal.        Behavior: Behavior normal.           Assessment & Plan:  1. Left Pontine CVA: Continue with Outpatient neuro Rehabilitation: Has a scheduled appointment with Neurology. Continue current medication regimen.  2. Uncontrolled Type 2 DM with  Hyperglycemia: PCP Following: Continue current medication regimen. Encourage to speak with Dietician, they verbalize understanding.  3. Hyperlipidemia: Continue current medication regimen. PCP following.   20  minutes of face to face patient care time was spent during this visit. All questions were encouraged and answered.  F/U in 4- 6 weeks with Dr. Letta Pate

## 2018-06-04 ENCOUNTER — Inpatient Hospital Stay: Payer: No Typology Code available for payment source | Admitting: Family Medicine

## 2018-06-08 ENCOUNTER — Ambulatory Visit: Payer: Commercial Indemnity | Admitting: Physical Therapy

## 2018-06-08 ENCOUNTER — Encounter: Payer: Self-pay | Admitting: Occupational Therapy

## 2018-06-08 ENCOUNTER — Ambulatory Visit: Payer: Commercial Indemnity | Admitting: Occupational Therapy

## 2018-06-08 ENCOUNTER — Encounter: Payer: Self-pay | Admitting: Physical Therapy

## 2018-06-08 DIAGNOSIS — R2689 Other abnormalities of gait and mobility: Secondary | ICD-10-CM

## 2018-06-08 DIAGNOSIS — M6281 Muscle weakness (generalized): Secondary | ICD-10-CM

## 2018-06-08 DIAGNOSIS — I69351 Hemiplegia and hemiparesis following cerebral infarction affecting right dominant side: Secondary | ICD-10-CM

## 2018-06-08 DIAGNOSIS — R293 Abnormal posture: Secondary | ICD-10-CM

## 2018-06-08 DIAGNOSIS — R278 Other lack of coordination: Secondary | ICD-10-CM

## 2018-06-08 DIAGNOSIS — R2681 Unsteadiness on feet: Secondary | ICD-10-CM

## 2018-06-08 NOTE — Patient Instructions (Signed)
Access Code: [email or web address]  URL: [email or web address]  Date: 06/08/2018  Prepared by: Willow Ora   Exercises  Sit to/from Stand in Stride position - 10 reps - 1 sets - 1x daily - 5x weekly  Tandem Walking with Counter Support - 3 reps - 1 sets - 1x daily - 5x weekly  Toe Walking with Counter Support - 3 reps - 1 sets - 1x daily - 5x weekly  Heel Walking with Counter Support - 3 reps - 1 sets - 1x daily - 5x weekly  Romberg Stance Eyes Closed on Foam Pad - 3 reps - 1 sets - 30 hold - 1x daily - 5x weekly  Wide Stance with Eyes Closed and Head Rotation on Foam Pad - 10 reps - 1 sets - 1x daily - 5x weekly  Wide Stance with Eyes Closed and Head Nods on Foam Pad - 10 reps - 1 sets - 1x daily - 5x weekly

## 2018-06-08 NOTE — Therapy (Signed)
[facility] [address] [place], [place], [address] Phone: [phone]   Fax:  [phone]  Physical Therapy Treatment  Patient Details  Name: Arthur Howard MRN: [id number] Date of Birth: 04-07-1965 Referring Provider (PT): Angiulli, Chapman Fitch (from hospital-f/u to be with Sherald Barge, NP)   Encounter Date: 06/08/2018  PT End of Session - 06/08/18 1537    Visit Number  2    Number of Visits  22    Date for PT Re-Evaluation  08/25/18    Authorization Type  [employer]-60 visits combined PT and OT if on same day    Authorization - Visit Number  2    Authorization - Number of Visits  60    PT Start Time  6433    PT Stop Time  1613    PT Time Calculation (min)  40 min    Equipment Utilized During Treatment  Gait belt    Activity Tolerance  Patient tolerated treatment well    Behavior During Therapy  [facility] for tasks assessed/performed       Past Medical History:  Diagnosis Date  . Diabetes mellitus without complication ([place])   . Hyperlipidemia     Past Surgical History:  Procedure Laterality Date  . ANKLE SURGERY      There were no vitals filed for this visit.  Subjective Assessment - 06/08/18 1536    Subjective  No new compaints. No falls or pain.    Patient is accompained by:  Family member   daughter   Patient Stated Goals  Pt's goal is to improve R hand, arm, leg strength.    Currently in Pain?  No/denies    Pain Score  0-No pain           [facility] Adult PT Treatment/Exercise - 06/08/18 1600      Transfers   Transfers  Sit to Stand;Stand to Sit      Ambulation/Gait   Ambulation/Gait  Yes    Ambulation/Gait Assistance  5: Supervision;4: Min guard    Ambulation/Gait Assistance Details  arrives with just AFO donned, no cane today. Reports not using brace at home, therefore removed for session. no issues noted initially with gait with good heel strike and knee control, however as session  progressed noted increased foot drop and recurvatum of knee. Pt advised to only not wear brace in home for short distances and to continue with it outside of home.     Assistive device  None    Gait Pattern  Step-through pattern;Decreased arm swing - right;Decreased step length - right;Decreased stance time - right;Decreased hip/knee flexion - right;Decreased dorsiflexion - right;Decreased trunk rotation    Ambulation Surface  Level;Indoor          Balance Exercises - 06/08/18 1610      Balance Exercises: Standing   SLS with Vectors  Foam/compliant surface;Other reps (comment);Limitations    Balance Beam  standing across red beam: alternating fwd heel taps to floor/back onto beam, then alternating bwd toe taps to floor/back onto beam. cues for step length/height and weight shifitng.      Balance Exercises: Standing   SLS with Vectors Limitations  standing across red beam with 2 tall cones in front: fwd toe taps, then cross toe taps. min assist for balance with cues on posture, stance position and weight shifitng for balance.         Issued the following to HEP today. Min guard assist with balance ex's. Pt and daughter  educated to have someone next to him for balance ex's for safety: Access Code: [email or web address]  URL: [email or web address]  Date: 06/08/2018  Prepared by: Willow Ora   Exercises  Sit to/from Stand in Stride position - 10 reps - 1 sets - 1x daily - 5x weekly  Tandem Walking with Counter Support - 3 reps - 1 sets - 1x daily - 5x weekly  Toe Walking with Counter Support - 3 reps - 1 sets - 1x daily - 5x weekly  Heel Walking with Counter Support - 3 reps - 1 sets - 1x daily - 5x weekly  Romberg Stance Eyes Closed on Foam Pad - 3 reps - 1 sets - 30 hold - 1x daily - 5x weekly  Wide Stance with Eyes Closed and Head Rotation on Foam Pad - 10 reps - 1 sets - 1x daily - 5x weekly  Wide Stance with Eyes Closed and Head Nods on Foam Pad - 10 reps - 1 sets - 1x daily - 5x  weekly    PT Short Term Goals - 05/27/18 1218      PT SHORT TERM GOAL #1   Title  Pt will be independent with HEP for improved transfers, balance, gait and strength.  TARGET 06/25/2018    Time  5    Period  Weeks    Status  New    Target Date  06/25/18      PT SHORT TERM GOAL #2   Title  Pt will improve 5x sit<>stand to less than or equal to 12.5 seconds for improved transfer efficiency and lower extremity strength.    Time  5    Period  Weeks    Status  New    Target Date  06/25/18      PT SHORT TERM GOAL #3   Title  Pt will improve TUG score to less than or equal to 13.5 sec for decreased fall risk.    Time  5    Period  Weeks    Status  New    Target Date  06/25/18      PT SHORT TERM GOAL #4   Title  Pt will improve DGI score to at least 17/24 for decreased fall risk.    Time  5    Period  Weeks    Status  New    Target Date  06/25/18      PT SHORT TERM GOAL #5   Title  Pt will negotiate 4 steps with alternating pattern, 1 rail modified independently, for preparation for return to work activities.    Time  5    Period  Weeks    Status  New    Target Date  06/25/18      Additional Short Term Goals   Additional Short Term Goals  Yes      PT SHORT TERM GOAL #6   Title  Pt will verbalize understanding of fall prevention in home environment.    Time  5    Period  Weeks    Target Date  06/25/18        PT Long Term Goals - 05/27/18 1221      PT LONG TERM GOAL #1   Title  Pt will be independent with progressive HEP for strength, balance, and gait.  TARGET 07/23/2018    Time  9    Period  Weeks    Status  New    Target Date  07/23/18      PT LONG  TERM GOAL #2   Title  Pt will perform 8 of 10 reps from surfaces <18" with no UE support, for improved transfer efficeincy and improved lower extremity strength.    Time  9    Period  Weeks    Status  New    Target Date  07/23/18      PT LONG TERM GOAL #3   Title  Pt will improve DGI score to at least 19/24 for  decreased fall risk.    Time  9    Period  Weeks    Status  New    Target Date  07/23/18      PT LONG TERM GOAL #4   Title  Pt will ambulate at least 1000 ft, independently, no LOB, indoor and outdoor surfaces, for improved community gait.    Time  9    Period  Weeks    Status  New    Target Date  07/23/18      PT LONG TERM GOAL #5   Title  Pt will perform floor>stand transfer independently, for safe fall recovery/floor activities/return to work.    Time  9    Period  Weeks    Status  New    Target Date  07/23/18            Plan - 06/08/18 1537    Clinical Impression Statement  Today's skilled session focused on additions to pt's HEP, gait without device/brace on right LE and balance reactions. Pt noted to be stable with gait without brace/device for short distance, howver noted foot drop/slap of right LE when fatigued. Advised pt to wear brace anytime he leaves the house, okay for short distance indoors only. Pt and daughter verbalized understanding. Pt to wear shorts tomorrow/next session for Bioness set up.     Personal Factors and Comorbidities  Comorbidity 2;Past/Current Experience   Independent and active prior to CVA   Comorbidities  hyperlipidemia, DM    Examination-Activity Limitations  Carry;Stairs;Locomotion Level;Transfers    Examination-Participation Restrictions  Community Activity;[employer], Work   Database administrator  Good    PT Frequency  Other (comment)   2x/wk for eval week; then 3x/wk for 4 weeks, 2x/wk 4 weeks   PT Duration  Other (comment)   total = 9 weeks POC   PT Treatment/Interventions  ADLs/Self Care Home Management;Electrical Stimulation;DME Instruction;Gait training;Stair training;Functional mobility training;Therapeutic activities;Therapeutic exercise;Orthotic Fit/Training;Patient/family education;Neuromuscular re-education;Balance training;Manual techniques    PT  Next Visit Plan  set up Bioness to right LE, continue to work on strengthening, balance and gait. address trunk control/strengthening as well.     PT Home Exercise Plan  Access Code: [email or web address] , plus gastroc stretch    Consulted and Agree with Plan of Care  Patient;Family member/caregiver    Family Member Consulted  Daughter       Patient will benefit from skilled therapeutic intervention in order to improve the following deficits and impairments:  Abnormal gait, Decreased balance, Decreased mobility, Difficulty walking, Decreased strength, Impaired flexibility, Impaired tone, Postural dysfunction  Visit Diagnosis: Other abnormalities of gait and mobility  Muscle weakness (generalized)  Unsteadiness on feet  Abnormal posture     Problem List Patient Active Problem List   Diagnosis Date Noted  . Left pontine cerebrovascular accident ([place]) 05/11/2018  . Tobacco abuse   . Uncontrolled type 2 diabetes mellitus with hyperglycemia ([place])   . CVA (cerebral vascular accident) ([place]) 05/06/2018  .  Hyperlipidemia 05/30/2017    Willow Ora, PTA, [facility] [address], [place], [place] [address] [phone] 06/08/18, 8:44 PM   Name: Arthur Howard MRN: [id number] Date of Birth: May 28, 1965

## 2018-06-08 NOTE — Therapy (Signed)
[facility] [address] [place], [place], [address] Phone: [phone]   Fax:  [phone]  Occupational Therapy Treatment  Patient Details  Name: Arthur Howard MRN: [id number] Date of Birth: June 11, 1965 No data recorded  Encounter Date: 06/08/2018  OT End of Session - 06/08/18 1641    Visit Number  2    Number of Visits  33    Date for OT Re-Evaluation  08/25/18    Authorization Type  [employer]ign[employer]    Authorization Time Period  12 weeks, 60 visit limit, counts as 1 visit if PT/OT    Authorization - Visit Number  2    Authorization - Number of Visits  70    OT Start Time  1447    OT Stop Time  1530    OT Time Calculation (min)  43 min    Activity Tolerance  Patient tolerated treatment well    Behavior During Therapy  [facility] for tasks assessed/performed       Past Medical History:  Diagnosis Date  . Diabetes mellitus without complication ([place])   . Hyperlipidemia     Past Surgical History:  Procedure Laterality Date  . ANKLE SURGERY      There were no vitals filed for this visit.  Subjective Assessment - 06/08/18 1629    Subjective   patient reports he ahs been dressing himself    Pertinent History  CVA, DM, hyperlipidemia    Limitations  non English speaking    Patient Stated Goals  recover use of RUE    Currently in Pain?  No/denies    Pain Score  0-No pain                   OT Treatments/Exercises (OP) - 06/08/18 0001      ADLs   ADL Comments  Reviewed OT short and long term goals with patient and his daughter      Neurological Re-education Exercises   Other Exercises 1  Patient with overactivation in proximal shoulder girdle. Patient uses excessive head/neck extension, and has limited movement from lower trunk.  Patient with overuse of shoulder girdle elevation - used miror for visual input to reduce pattern. In supine, patient with storng internal rotation component to every attempt for  reach.  worked on place and hold witrh minor rotation variations.  Patient able to susatin with slight elbow flexion.  Worked on isolating elbow flex/ext with shoulder at ~ 90* flex supine.  Patient responds well to visual and demonstration cues.  Worked on rolling onto right side - patient needed to activate trunk felxion and roll up and over his right shoulder to avoid pain.  Worked on beginning to accept weight onto right forearm - in sidelying to sidesitting transition.  Worked to encourage lower trunk initiated weight shifts sitting on physioball, then 4 pt to begi to accept weight into aligned RUE. Patient with report of mild wrist discomfort  in full wrist extension, but in modified position no pain.  Patient instructed to stop exercising with resisitive bands for RUE as this could cause shoulder pain with the amount of muscle imbalance he currently has.               OT Education - 06/08/18 1641    Education Details  instructed to stop resistiv band training for RUE    Person(s) Educated  Patient;Child(ren)    Methods  Explanation    Comprehension  Verbalized understanding  OT Short Term Goals - 06/08/18 1644      OT SHORT TERM GOAL #1   Title  I with inital HEP.    Time  6    Period  Weeks    Status  On-going    Target Date  07/11/18      OT SHORT TERM GOAL #2   Title  Pt will demonstrate  improved RUE functional use as evidenced by increasing box/ blocks score by 8 blocks.    Baseline  RUE 48, LUE 75    Time  6    Period  Weeks    Status  On-going      OT SHORT TERM GOAL #3   Title  Pt will demonstrate ability to retrieve a light weight object from overhead shelf at 120 shoulder flexion with no more than min compensations.    Time  6    Period  Weeks    Status  On-going      OT SHORT TERM GOAL #4   Title  Pt will perfrom all basic ADLS modified independently.    Time  6    Period  Weeks    Status  On-going      OT SHORT TERM GOAL #5   Title  Pt will  increase RUE grip strength to 35 lbs of greater for increased functional use.    Time  6    Period  Weeks    Status  On-going        OT Long Term Goals - 05/27/18 1326      OT LONG TERM GOAL #1   Title  Pt will demonstrate ability to retrieve a 3 lbs weight from overhead shelf demonstrating good control with RUE.    Time  12    Period  Weeks    Status  New    Target Date  08/25/18      OT LONG TERM GOAL #2   Title  Pt will perform mod complex home managment modified independently.    Time  12    Period  Weeks    Status  New      OT LONG TERM GOAL #3   Title  Pt will demonstrate improved RUE fine motor coordination for ADLS as evidenced by completing 9 hole peg test in 38 secs or less.    Time  12    Period  Weeks    Status  New      OT LONG TERM GOAL #4   Title  Pt will report that he has returned to using his RUE as dominant hand at least 75% of the time for ADLs/IADLs..    Time  12    Period  Weeks    Status  New      OT LONG TERM GOAL #5   Title  Pt will demonstrate ability to write his name and address legibily using RUE.    Time  12    Period  Weeks    Status  New      Long Term Additional Goals   Additional Long Term Goals  Yes      OT LONG TERM GOAL #6   Title  Pt will perform simulated work activities modified indpendently demonstrating good safety awareness.    Time  12    Period  Weeks    Status  New            Plan - 06/08/18 1642    Clinical Impression Statement  Patient eager to improve functional use and strength in RUE.  He reports seeing steady progress in active movement and use of arm for basic self care skills even since evaluation.      OT Occupational Profile and History  Detailed Assessment- Review of Records and additional review of physical, cognitive, psychosocial history related to current functional performance    Occupational performance deficits (Please refer to evaluation for details):  ADL's;IADL's;Work;Leisure;Social  Participation    Body Structure / Function / Physical Skills  ADL;Dexterity;Flexibility;ROM;Strength;IADL;FMC;Coordination;Balance;UE functional use;GMC;Gait;Endurance;Decreased knowledge of precautions;Sensation    Rehab Potential  Good    Clinical Decision Making  Limited treatment options, no task modification necessary    Comorbidities Affecting Occupational Performance:  May have comorbidities impacting occupational performance    Modification or Assistance to Complete Evaluation   No modification of tasks or assist necessary to complete eval    OT Frequency  3x / week    OT Duration  8 weeks    OT Treatment/Interventions  Self-care/ADL training;Cryotherapy;Paraffin;Therapeutic exercise;DME and/or AE instruction;Functional Mobility Training;Balance training;Splinting;Manual Therapy;Neuromuscular education;Fluidtherapy;Ultrasound;Aquatic Therapy;Electrical Stimulation;Moist Heat;Contrast Bath;Energy conservation;Passive range of motion;Therapeutic activities;Patient/family education    Plan  initiate HEP, NMR RUE    Consulted and Agree with Plan of Care  Patient;Family member/caregiver    Family Member Consulted  dtr who interprets for him       Patient will benefit from skilled therapeutic intervention in order to improve the following deficits and impairments:  Body Structure / Function / Physical Skills  Visit Diagnosis: Muscle weakness (generalized)  Hemiplegia and hemiparesis following cerebral infarction affecting right dominant side (HCC)  Other lack of coordination  Unsteadiness on feet  Abnormal posture    Problem List Patient Active Problem List   Diagnosis Date Noted  . Left pontine cerebrovascular accident ([place]) 05/11/2018  . Tobacco abuse   . Uncontrolled type 2 diabetes mellitus with hyperglycemia ([place])   . CVA (cerebral vascular accident) ([place]) 05/06/2018  . Hyperlipidemia 05/30/2017    Mariah Milling 06/08/2018, 4:45 PM  [facility] [address] [place], [place], [address] Phone: [phone]   Fax:  [phone]  Name: Arthur Howard MRN: [id number] Date of Birth: 08/01/1965

## 2018-06-09 ENCOUNTER — Ambulatory Visit: Payer: Commercial Indemnity | Admitting: Physical Therapy

## 2018-06-09 ENCOUNTER — Encounter: Payer: Self-pay | Admitting: Physical Therapy

## 2018-06-09 ENCOUNTER — Ambulatory Visit: Payer: Commercial Indemnity | Admitting: Occupational Therapy

## 2018-06-09 DIAGNOSIS — I69351 Hemiplegia and hemiparesis following cerebral infarction affecting right dominant side: Secondary | ICD-10-CM

## 2018-06-09 DIAGNOSIS — M6281 Muscle weakness (generalized): Secondary | ICD-10-CM

## 2018-06-09 DIAGNOSIS — R2689 Other abnormalities of gait and mobility: Secondary | ICD-10-CM | POA: Diagnosis not present

## 2018-06-09 DIAGNOSIS — R2681 Unsteadiness on feet: Secondary | ICD-10-CM

## 2018-06-09 DIAGNOSIS — R278 Other lack of coordination: Secondary | ICD-10-CM

## 2018-06-09 NOTE — Therapy (Signed)
[facility] [address] [place], [place], [address] Phone: [phone]   Fax:  [phone]  Physical Therapy Treatment  Patient Details  Name: Arthur Howard MRN: [id number] Date of Birth: 23-Oct-1965 Referring Provider (PT): Angiulli, Chapman Fitch (from hospital-f/u to be with Sherald Barge, NP)   Encounter Date: 06/09/2018  PT End of Session - 06/09/18 1451    Visit Number  3    Number of Visits  22    Date for PT Re-Evaluation  08/25/18    Authorization Type  [employer]-60 visits combined PT and OT if on same day    Authorization - Visit Number  3    Authorization - Number of Visits  60    PT Start Time  5093    PT Stop Time  1529    PT Time Calculation (min)  40 min    Equipment Utilized During Treatment  Gait belt    Activity Tolerance  Patient tolerated treatment well    Behavior During Therapy  [facility] for tasks assessed/performed       Past Medical History:  Diagnosis Date  . Diabetes mellitus without complication ([place])   . Hyperlipidemia     Past Surgical History:  Procedure Laterality Date  . ANKLE SURGERY      There were no vitals filed for this visit.  Subjective Assessment - 06/09/18 1451    Subjective  No new compaints. No falls or pain.    Patient is accompained by:  Family member   daughter   Patient Stated Goals  Pt's goal is to improve R hand, arm, leg strength.    Currently in Pain?  No/denies    Pain Score  0-No pain           [facility] Adult PT Treatment/Exercise - 06/09/18 1452      Transfers   Transfers  Sit to Stand;Stand to Sit    Sit to Stand  5: Supervision;Without upper extremity assist;From chair/3-in-1    Stand to Sit  5: Supervision;Without upper extremity assist;To chair/3-in-1      Ambulation/Gait   Ambulation/Gait  Yes    Ambulation/Gait Assistance  5: Supervision;4: Min guard    Ambulation/Gait Assistance Details  concurrent with Bioness to upper and lower leg.  cues for posture/fwd gaze, increased stance/loading on right LE with gait and to focus on heel to toe progressing with gait wiht Binoness.     Ambulation Distance (Feet)  115 Feet   x5   Assistive device  None;Other (Comment)   Bioness to right LE   Gait Pattern  Step-through pattern;Decreased arm swing - right;Decreased step length - right;Decreased stance time - right;Decreased hip/knee flexion - right;Decreased dorsiflexion - right;Decreased trunk rotation    Ambulation Surface  Level;Indoor      Modalities   Modalities  Teacher, English as a foreign language Location  right lower leg    Electrical Stimulation Action  for improved DF, decr foot slap, incr muscle activition/muscle re-ed and improved knee control in stance    Electrical Stimulation Parameters  tablet 1; steering electrodes    Electrical Stimulation Goals  Strength;Neuromuscular facilitation               PT Short Term Goals - 05/27/18 1218      PT SHORT TERM GOAL #1   Title  Pt will be independent with HEP for improved transfers, balance, gait and strength.  TARGET 06/25/2018  Time  5    Period  Weeks    Status  New    Target Date  06/25/18      PT SHORT TERM GOAL #2   Title  Pt will improve 5x sit<>stand to less than or equal to 12.5 seconds for improved transfer efficiency and lower extremity strength.    Time  5    Period  Weeks    Status  New    Target Date  06/25/18      PT SHORT TERM GOAL #3   Title  Pt will improve TUG score to less than or equal to 13.5 sec for decreased fall risk.    Time  5    Period  Weeks    Status  New    Target Date  06/25/18      PT SHORT TERM GOAL #4   Title  Pt will improve DGI score to at least 17/24 for decreased fall risk.    Time  5    Period  Weeks    Status  New    Target Date  06/25/18      PT SHORT TERM GOAL #5   Title  Pt will negotiate 4 steps with alternating pattern, 1 rail modified independently, for  preparation for return to work activities.    Time  5    Period  Weeks    Status  New    Target Date  06/25/18      Additional Short Term Goals   Additional Short Term Goals  Yes      PT SHORT TERM GOAL #6   Title  Pt will verbalize understanding of fall prevention in home environment.    Time  5    Period  Weeks    Target Date  06/25/18        PT Long Term Goals - 05/27/18 1221      PT LONG TERM GOAL #1   Title  Pt will be independent with progressive HEP for strength, balance, and gait.  TARGET 07/23/2018    Time  9    Period  Weeks    Status  New    Target Date  07/23/18      PT LONG TERM GOAL #2   Title  Pt will perform 8 of 10 reps from surfaces <18" with no UE support, for improved transfer efficeincy and improved lower extremity strength.    Time  9    Period  Weeks    Status  New    Target Date  07/23/18      PT LONG TERM GOAL #3   Title  Pt will improve DGI score to at least 19/24 for decreased fall risk.    Time  9    Period  Weeks    Status  New    Target Date  07/23/18      PT LONG TERM GOAL #4   Title  Pt will ambulate at least 1000 ft, independently, no LOB, indoor and outdoor surfaces, for improved community gait.    Time  9    Period  Weeks    Status  New    Target Date  07/23/18      PT LONG TERM GOAL #5   Title  Pt will perform floor>stand transfer independently, for safe fall recovery/floor activities/return to work.    Time  9    Period  Weeks    Status  New    Target Date  07/23/18  Plan - 06/09/18 1452    Clinical Impression Statement  Today's skilled session focused on set up and use of Bioness to right LE for improved foot clearance/DF and improved knee control in stance phase of gait. Pt with good response to Bioness, skin intact afterwards. Needs cues to fully load/weight shifting onto right LE with gait as well. The pt is progressing toward goals and should benefit from continued PT to progress toward unmet goals.      Personal Factors and Comorbidities  Comorbidity 2;Past/Current Experience   Independent and active prior to CVA   Comorbidities  hyperlipidemia, DM    Examination-Activity Limitations  Carry;Stairs;Locomotion Level;Transfers    Examination-Participation Restrictions  Community Activity;[employer], Work   Database administrator  Good    PT Frequency  Other (comment)   2x/wk for eval week; then 3x/wk for 4 weeks, 2x/wk 4 weeks   PT Duration  Other (comment)   total = 9 weeks POC   PT Treatment/Interventions  ADLs/Self Care Home Management;Electrical Stimulation;DME Instruction;Gait training;Stair training;Functional mobility training;Therapeutic activities;Therapeutic exercise;Orthotic Fit/Training;Patient/family education;Neuromuscular re-education;Balance training;Manual techniques    PT Next Visit Plan  continue to use Bioness concurrent with strengthening, balance and gait. address trunk control/strengthening as well.     PT Home Exercise Plan  Access Code: [email or web address] , plus gastroc stretch    Consulted and Agree with Plan of Care  Patient;Family member/caregiver    Family Member Consulted  Daughter       Patient will benefit from skilled therapeutic intervention in order to improve the following deficits and impairments:  Abnormal gait, Decreased balance, Decreased mobility, Difficulty walking, Decreased strength, Impaired flexibility, Impaired tone, Postural dysfunction  Visit Diagnosis: Hemiplegia and hemiparesis following cerebral infarction affecting right dominant side (HCC)  Muscle weakness (generalized)  Unsteadiness on feet     Problem List Patient Active Problem List   Diagnosis Date Noted  . Left pontine cerebrovascular accident ([place]) 05/11/2018  . Tobacco abuse   . Uncontrolled type 2 diabetes mellitus with hyperglycemia ([place])   . CVA (cerebral vascular accident) ([place]) 05/06/2018  .  Hyperlipidemia 05/30/2017    Willow Ora, PTA, [facility] [address] [place], [place] [address] [phone] 06/09/18, 4:33 PM   Name: Arthur Howard MRN: [id number] Date of Birth: [birth year]

## 2018-06-09 NOTE — Therapy (Signed)
[facility] [address] [place], [place], [address] Phone: [phone]   Fax:  [phone]  Occupational Therapy Treatment  Patient Details  Name: Arthur Howard MRN: [id number] Date of Birth: 05-Mar-1966 No data recorded  Encounter Date: 06/09/2018  OT End of Session - 06/09/18 1647    Visit Number  3    Number of Visits  33    Date for OT Re-Evaluation  08/25/18    Authorization Type  [employer]    Authorization Time Period  12 weeks, 60 visit limit, counts as 1 visit if PT/OT    Authorization - Visit Number  3    Authorization - Number of Visits  70    OT Start Time  1533    OT Stop Time  1615    OT Time Calculation (min)  42 min       Past Medical History:  Diagnosis Date  . Diabetes mellitus without complication ([place])   . Hyperlipidemia     Past Surgical History:  Procedure Laterality Date  . ANKLE SURGERY      There were no vitals filed for this visit.  Subjective Assessment - 06/09/18 1647    Pertinent History  CVA, DM, hyperlipidemia    Limitations  non English speaking    Currently in Pain?  No/denies              Treatment: joint mobs with gentle distraction followed by supine scapular retraction and shoulder depression.Supine, pt was instructed in closed chain chest press and shoulder flexion HEP, min v.c, facilitation for proper positioning Weightbearing through elbow with body on arm movements min-mod facilitation Weightbearing through tilted stool with low range shoulder flexion mjn facilitation for shoulder positioning Then functional coordination activities seated at table, picking up and stacking coins, flipping and dealing cards, then red putty HEP was issued for sustained grip and pinch. Pt returned demonstration.               OT Short Term Goals - 06/08/18 1644      OT SHORT TERM GOAL #1   Title  I with inital HEP.    Time  6    Period  Weeks    Status  On-going     Target Date  07/11/18      OT SHORT TERM GOAL #2   Title  Pt will demonstrate  improved RUE functional use as evidenced by increasing box/ blocks score by 8 blocks.    Baseline  RUE 48, LUE 75    Time  6    Period  Weeks    Status  On-going      OT SHORT TERM GOAL #3   Title  Pt will demonstrate ability to retrieve a light weight object from overhead shelf at 120 shoulder flexion with no more than min compensations.    Time  6    Period  Weeks    Status  On-going      OT SHORT TERM GOAL #4   Title  Pt will perfrom all basic ADLS modified independently.    Time  6    Period  Weeks    Status  On-going      OT SHORT TERM GOAL #5   Title  Pt will increase RUE grip strength to 35 lbs of greater for increased functional use.    Time  6    Period  Weeks    Status  On-going  OT Long Term Goals - 05/27/18 1326      OT LONG TERM GOAL #1   Title  Pt will demonstrate ability to retrieve a 3 lbs weight from overhead shelf demonstrating good control with RUE.    Time  12    Period  Weeks    Status  New    Target Date  08/25/18      OT LONG TERM GOAL #2   Title  Pt will perform mod complex home managment modified independently.    Time  12    Period  Weeks    Status  New      OT LONG TERM GOAL #3   Title  Pt will demonstrate improved RUE fine motor coordination for ADLS as evidenced by completing 9 hole peg test in 38 secs or less.    Time  12    Period  Weeks    Status  New      OT LONG TERM GOAL #4   Title  Pt will report that he has returned to using his RUE as dominant hand at least 75% of the time for ADLs/IADLs..    Time  12    Period  Weeks    Status  New      OT LONG TERM GOAL #5   Title  Pt will demonstrate ability to write his name and address legibily using RUE.    Time  12    Period  Weeks    Status  New      Long Term Additional Goals   Additional Long Term Goals  Yes      OT LONG TERM GOAL #6   Title  Pt will perform simulated work activities  modified indpendently demonstrating good safety awareness.    Time  12    Period  Weeks    Status  New              Patient will benefit from skilled therapeutic intervention in order to improve the following deficits and impairments:     Visit Diagnosis: No diagnosis found.    Problem List Patient Active Problem List   Diagnosis Date Noted  . Left pontine cerebrovascular accident ([place]) 05/11/2018  . Tobacco abuse   . Uncontrolled type 2 diabetes mellitus with hyperglycemia ([place])   . CVA (cerebral vascular accident) ([place]) 05/06/2018  . Hyperlipidemia 05/30/2017    RINE,KATHRYN 06/09/2018, 4:48 PM  [facility] [address] [place], [place], [address] Phone: [phone]   Fax:  [phone]  Name: DACE DENN MRN: [id number] Date of Birth: [birth year]

## 2018-06-10 ENCOUNTER — Ambulatory Visit: Payer: No Typology Code available for payment source | Admitting: Emergency Medicine

## 2018-06-11 ENCOUNTER — Ambulatory Visit: Payer: Commercial Indemnity | Admitting: Physical Therapy

## 2018-06-14 ENCOUNTER — Ambulatory Visit: Payer: Commercial Indemnity | Admitting: Physical Therapy

## 2018-06-14 ENCOUNTER — Telehealth: Payer: Self-pay | Admitting: Occupational Therapy

## 2018-06-14 NOTE — Telephone Encounter (Signed)
Therapist spoke with pt's dtr today regarding the temporary closing of [facility] due to Covid-19.  Therapist discussed:  Importance of continued HEP and pt wearing his leg brace whenever he is walking longer distances outside of the home.  [facility] will follow up with patients when we are able to resume care.  Theone Murdoch, OTR/L [phone] Phone: [phone] 4:05 PM 06/14/18 [facility] [address] [place], [place]  [address] Phone:  [phone] Fax:  [phone] \

## 2018-06-15 ENCOUNTER — Ambulatory Visit: Payer: Commercial Indemnity | Admitting: Occupational Therapy

## 2018-06-16 ENCOUNTER — Encounter: Payer: Self-pay | Admitting: Physical Therapy

## 2018-06-16 ENCOUNTER — Telehealth: Payer: Self-pay | Admitting: Physical Therapy

## 2018-06-16 ENCOUNTER — Ambulatory Visit: Payer: Commercial Indemnity | Admitting: Physical Therapy

## 2018-06-16 ENCOUNTER — Other Ambulatory Visit: Payer: Self-pay

## 2018-06-16 ENCOUNTER — Ambulatory Visit: Payer: Commercial Indemnity | Admitting: Occupational Therapy

## 2018-06-16 DIAGNOSIS — R2689 Other abnormalities of gait and mobility: Secondary | ICD-10-CM

## 2018-06-16 DIAGNOSIS — I69351 Hemiplegia and hemiparesis following cerebral infarction affecting right dominant side: Secondary | ICD-10-CM

## 2018-06-16 DIAGNOSIS — M6281 Muscle weakness (generalized): Secondary | ICD-10-CM

## 2018-06-16 DIAGNOSIS — R2681 Unsteadiness on feet: Secondary | ICD-10-CM

## 2018-06-16 DIAGNOSIS — R278 Other lack of coordination: Secondary | ICD-10-CM

## 2018-06-16 DIAGNOSIS — R293 Abnormal posture: Secondary | ICD-10-CM

## 2018-06-16 NOTE — Therapy (Signed)
[facility] [address] [place], [place], [address] Phone: [phone]   Fax:  [phone]  Physical Therapy Treatment  Patient Details  Name: Arthur Howard MRN: [id number] Date of Birth: 05-Feb-1966 Referring Provider (PT): Angiulli, Chapman Fitch (from hospital-f/u to be with Sherald Barge, NP)   Encounter Date: 06/16/2018  PT End of Session - 06/16/18 1058    Visit Number  4    Number of Visits  22    Date for PT Re-Evaluation  08/25/18    Authorization Type  [employer]-60 visits combined PT and OT if on same day    Authorization - Visit Number  4    Authorization - Number of Visits  60    PT Start Time  1057    PT Stop Time  1141    PT Time Calculation (min)  44 min    Equipment Utilized During Treatment  Gait belt;Other (comment)   Bioness to right LE   Activity Tolerance  Patient tolerated treatment well;No increased pain    Behavior During Therapy  WFL for tasks assessed/performed       Past Medical History:  Diagnosis Date  . Diabetes mellitus without complication ([place])   . Hyperlipidemia     Past Surgical History:  Procedure Laterality Date  . ANKLE SURGERY      There were no vitals filed for this visit.  Subjective Assessment - 06/16/18 1058    Subjective  No new compaints. No falls or pain. Liked the [employer] used at last session.     Patient is accompained by:  Family member   daughter   Patient Stated Goals  Pt's goal is to improve R hand, arm, leg strength.    Currently in Pain?  No/denies    Pain Score  0-No pain              [facility] Adult PT Treatment/Exercise - 06/16/18 1059      Transfers   Transfers  Sit to Stand;Stand to Sit    Sit to Stand  5: Supervision;Without upper extremity assist;From chair/3-in-1    Stand to Sit  5: Supervision;Without upper extremity assist;To chair/3-in-1      Ambulation/Gait   Ambulation/Gait  Yes    Ambulation/Gait Assistance  4: Min guard    Ambulation/Gait Assistance Details  concurrent with Bioness to right LE. continued to need cues on step length, weight shifitng and knee control in stance phase. cues needed for upper body/trunk alignment as well.     Ambulation Distance (Feet)  230 Feet   x1, plus around gym with activity   Assistive device  None;Other (Comment)    Gait Pattern  Step-through pattern;Decreased arm swing - right;Decreased step length - right;Decreased stance time - right;Decreased hip/knee flexion - right;Decreased dorsiflexion - right;Decreased trunk rotation    Ambulation Surface  Level;Indoor      High Level Balance   High Level Balance Activities  Side stepping;Marching forwards;Marching backwards;Tandem walking   tandem fwd/bwd   High Level Balance Comments  next to counter concurrent with Bioness: cues to slow down, for ex form/technique and to keep soft knee in stance on right side. min guard to min assist for balance.       Neuro Re-ed    Neuro Re-ed Details   concurrent with Bioness to right LE for strengthening/muslce re-ed: right foot on bottom step- lifting left LE up into air with on time, resting with left foot on floor with off time for  10 reps; in left stance on floor- right LE taps up/down bottom 2 steps with on time, resting between with off time (bil feet on floor), cues for hip/knee flexion, decr trunk lean with mobility, 10 reps performed. bil hand support on rails with both activities; wall squats with on time of 5 sec's, rest of 10 sec's for 10 reps, min guard assist for safety; with left UE support on counter top- right fwd/bwd lunge with on time of 5 sec's, rest between of 10 sec's for 10 reps. min guard assist for balalnce.        Acupuncturist Location  right lower leg    Electrical Stimulation Action  for strengthening, improved muscle activation for DF, knee control with gait.     Electrical Stimulation Parameters  table 1; steering electrodes     Electrical Stimulation Goals  Strength;Neuromuscular facilitation               PT Short Term Goals - 05/27/18 1218      PT SHORT TERM GOAL #1   Title  Pt will be independent with HEP for improved transfers, balance, gait and strength.  TARGET 06/25/2018    Time  5    Period  Weeks    Status  New    Target Date  06/25/18      PT SHORT TERM GOAL #2   Title  Pt will improve 5x sit<>stand to less than or equal to 12.5 seconds for improved transfer efficiency and lower extremity strength.    Time  5    Period  Weeks    Status  New    Target Date  06/25/18      PT SHORT TERM GOAL #3   Title  Pt will improve TUG score to less than or equal to 13.5 sec for decreased fall risk.    Time  5    Period  Weeks    Status  New    Target Date  06/25/18      PT SHORT TERM GOAL #4   Title  Pt will improve DGI score to at least 17/24 for decreased fall risk.    Time  5    Period  Weeks    Status  New    Target Date  06/25/18      PT SHORT TERM GOAL #5   Title  Pt will negotiate 4 steps with alternating pattern, 1 rail modified independently, for preparation for return to work activities.    Time  5    Period  Weeks    Status  New    Target Date  06/25/18      Additional Short Term Goals   Additional Short Term Goals  Yes      PT SHORT TERM GOAL #6   Title  Pt will verbalize understanding of fall prevention in home environment.    Time  5    Period  Weeks    Target Date  06/25/18        PT Long Term Goals - 05/27/18 1221      PT LONG TERM GOAL #1   Title  Pt will be independent with progressive HEP for strength, balance, and gait.  TARGET 07/23/2018    Time  9    Period  Weeks    Status  New    Target Date  07/23/18      PT LONG TERM GOAL #2   Title  Pt will perform  8 of 10 reps from surfaces <18" with no UE support, for improved transfer efficeincy and improved lower extremity strength.    Time  9    Period  Weeks    Status  New    Target Date  07/23/18      PT  LONG TERM GOAL #3   Title  Pt will improve DGI score to at least 19/24 for decreased fall risk.    Time  9    Period  Weeks    Status  New    Target Date  07/23/18      PT LONG TERM GOAL #4   Title  Pt will ambulate at least 1000 ft, independently, no LOB, indoor and outdoor surfaces, for improved community gait.    Time  9    Period  Weeks    Status  New    Target Date  07/23/18      PT LONG TERM GOAL #5   Title  Pt will perform floor>stand transfer independently, for safe fall recovery/floor activities/return to work.    Time  9    Period  Weeks    Status  New    Target Date  07/23/18            Plan - 06/16/18 1058    Clinical Impression Statement  Today's skilled session continued with use of Bioness to right LE with gait, strengthening and balance activities. No issues noted other than LE fatigue which was alleviated by rest breaks. The pt is making steady progress toward goals and should benefit from continued PT to progress toward unmet goals.             Personal Factors and Comorbidities  Comorbidity 2;Past/Current Experience   Independent and active prior to CVA   Comorbidities  hyperlipidemia, DM    Examination-Activity Limitations  Carry;Stairs;Locomotion Level;Transfers    Examination-Participation Restrictions  Community Activity;[employer], Work   Database administrator  Good    PT Frequency  Other (comment)   2x/wk for eval week; then 3x/wk for 4 weeks, 2x/wk 4 weeks   PT Duration  Other (comment)   total = 9 weeks POC   PT Treatment/Interventions  ADLs/Self Care Home Management;Electrical Stimulation;DME Instruction;Gait training;Stair training;Functional mobility training;Therapeutic activities;Therapeutic exercise;Orthotic Fit/Training;Patient/family education;Neuromuscular re-education;Balance training;Manual techniques    PT Next Visit Plan  continue to use Bioness concurrent  with strengthening, balance and gait. address trunk control/strengthening as well. needs ankle stability/strengthening as well    PT Home Exercise Plan  Access Code: [email or web address] , plus gastroc stretch    Consulted and Agree with Plan of Care  Patient;Family member/caregiver    Family Member Consulted  Daughter       Patient will benefit from skilled therapeutic intervention in order to improve the following deficits and impairments:  Abnormal gait, Decreased balance, Decreased mobility, Difficulty walking, Decreased strength, Impaired flexibility, Impaired tone, Postural dysfunction  Visit Diagnosis: Hemiplegia and hemiparesis following cerebral infarction affecting right dominant side (HCC)  Muscle weakness (generalized)  Unsteadiness on feet  Other abnormalities of gait and mobility     Problem List Patient Active Problem List   Diagnosis Date Noted  . Left pontine cerebrovascular accident ([place]) 05/11/2018  . Tobacco abuse   . Uncontrolled type 2 diabetes mellitus with hyperglycemia ([place])   . CVA (cerebral vascular accident) ([place]) 05/06/2018  . Hyperlipidemia 05/30/2017    Willow Ora, PTA, [facility] [address], [place], [place] [address] [phone] 06/16/18, 1:43 PM   Name: Arthur Howard MRN: [id number] Date of Birth: [birth year]

## 2018-06-16 NOTE — Telephone Encounter (Signed)
Created in error.   Willow Ora, PTA, [facility] [address] [place], [place] [address] [phone] 06/16/18, 1:38 PM

## 2018-06-16 NOTE — Therapy (Signed)
[facility] [address] [place], [place], [address] Phone: [phone]   Fax:  [phone]  Occupational Therapy Treatment  Patient Details  Name: MAXSON ODDO MRN: [id number] Date of Birth: 15-Nov-1965 No data recorded  Encounter Date: 06/16/2018  OT End of Session - 06/16/18 1325    Visit Number  4    Number of Visits  33    Date for OT Re-Evaluation  08/25/18    Authorization Type  [employer]    Authorization Time Period  12 weeks, 60 visit limit, counts as 1 visit if PT/OT    Authorization - Visit Number  4    Authorization - Number of Visits  60    OT Start Time  1142    OT Stop Time  1230    OT Time Calculation (min)  48 min    Activity Tolerance  Patient tolerated treatment well    Behavior During Therapy  [facility] for tasks assessed/performed       Past Medical History:  Diagnosis Date  . Diabetes mellitus without complication ([place])   . Hyperlipidemia     Past Surgical History:  Procedure Laterality Date  . ANKLE SURGERY      There were no vitals filed for this visit.  Subjective Assessment - 06/16/18 1336    Pertinent History  CVA, DM, hyperlipidemia    Limitations  non English speaking    Patient Stated Goals  recover use of RUE    Currently in Pain?  Yes    Pain Score  2     Pain Location  Shoulder    Pain Orientation  Right    Pain Descriptors / Indicators  Aching;Sharp    Pain Type  Acute pain    Pain Onset  1 to 4 weeks ago    Pain Frequency  Intermittent    Aggravating Factors   malpositioning    Pain Relieving Factors  repositioning             Treatment: Supine joint mobs to RUE with gentle distraction. Closed chain shoulder flexion with therapist  facilitating scapular positioning, then unilateral AA/ROM Standing AA/ROM shoulder flexion and circumduction with UE ranger, mod facilitation Seated weightbearing through RUE elbow then hand for body on arm movements, min v.c, min-mod  faciliation Modified cat and cow and rocking forwards and back wards for increased weight bearing and trunk mobility. Standing at tabletop to roll ball forwards and backwards, min v.c Low to mid range closed chain shoulder flexion, with min-mod facilitation with emphasis on normal movement.                OT Short Term Goals - 06/08/18 1644      OT SHORT TERM GOAL #1   Title  I with inital HEP.    Time  6    Period  Weeks    Status  On-going    Target Date  07/11/18      OT SHORT TERM GOAL #2   Title  Pt will demonstrate  improved RUE functional use as evidenced by increasing box/ blocks score by 8 blocks.    Baseline  RUE 48, LUE 75    Time  6    Period  Weeks    Status  On-going      OT SHORT TERM GOAL #3   Title  Pt will demonstrate ability to retrieve a light weight object from overhead shelf at 120 shoulder flexion with no more than  min compensations.    Time  6    Period  Weeks    Status  On-going      OT SHORT TERM GOAL #4   Title  Pt will perfrom all basic ADLS modified independently.    Time  6    Period  Weeks    Status  On-going      OT SHORT TERM GOAL #5   Title  Pt will increase RUE grip strength to 35 lbs of greater for increased functional use.    Time  6    Period  Weeks    Status  On-going        OT Long Term Goals - 05/27/18 1326      OT LONG TERM GOAL #1   Title  Pt will demonstrate ability to retrieve a 3 lbs weight from overhead shelf demonstrating good control with RUE.    Time  12    Period  Weeks    Status  New    Target Date  08/25/18      OT LONG TERM GOAL #2   Title  Pt will perform mod complex home managment modified independently.    Time  12    Period  Weeks    Status  New      OT LONG TERM GOAL #3   Title  Pt will demonstrate improved RUE fine motor coordination for ADLS as evidenced by completing 9 hole peg test in 38 secs or less.    Time  12    Period  Weeks    Status  New      OT LONG TERM GOAL #4   Title   Pt will report that he has returned to using his RUE as dominant hand at least 75% of the time for ADLs/IADLs..    Time  12    Period  Weeks    Status  New      OT LONG TERM GOAL #5   Title  Pt will demonstrate ability to write his name and address legibily using RUE.    Time  12    Period  Weeks    Status  New      Long Term Additional Goals   Additional Long Term Goals  Yes      OT LONG TERM GOAL #6   Title  Pt will perform simulated work activities modified indpendently demonstrating good safety awareness.    Time  12    Period  Weeks    Status  New            Plan - 06/16/18 1329    Clinical Impression Statement  Patient is progressing towards goals. He demonstrates improving right shoulder positioning after reinforcement and decreased pain.    Occupational performance deficits (Please refer to evaluation for details):  ADL's;IADL's;Work;Leisure;Social Participation    Body Structure / Function / Physical Skills  ADL;Dexterity;Flexibility;ROM;Strength;IADL;FMC;Coordination;Balance;UE functional use;GMC;Gait;Endurance;Decreased knowledge of precautions;Sensation    Rehab Potential  Good    OT Frequency  3x / week    OT Duration  --   x 4 weeks followed by 2x week for 4 weeks   OT Treatment/Interventions  Self-care/ADL training;Cryotherapy;Paraffin;Therapeutic exercise;DME and/or AE instruction;Functional Mobility Training;Balance training;Splinting;Manual Therapy;Neuromuscular education;Fluidtherapy;Ultrasound;Aquatic Therapy;Electrical Stimulation;Moist Heat;Contrast Bath;Energy conservation;Passive range of motion;Therapeutic activities;Patient/family education    Plan  initiate HEP, NMR RUE    Consulted and Agree with Plan of Care  Patient;Family member/caregiver       Patient will benefit from skilled therapeutic  intervention in order to improve the following deficits and impairments:  Body Structure / Function / Physical Skills  Visit Diagnosis: Hemiplegia and  hemiparesis following cerebral infarction affecting right dominant side (HCC)  Muscle weakness (generalized)  Other lack of coordination  Unsteadiness on feet  Abnormal posture    Problem List Patient Active Problem List   Diagnosis Date Noted  . Left pontine cerebrovascular accident ([place]) 05/11/2018  . Tobacco abuse   . Uncontrolled type 2 diabetes mellitus with hyperglycemia ([place])   . CVA (cerebral vascular accident) ([place]) 05/06/2018  . Hyperlipidemia 05/30/2017    , 06/16/2018, 1:37 PM  [facility] [address] [place], [place], [address] Phone: [phone]   Fax:  [phone]  Name: STEVENS MAGWOOD MRN: [id number] Date of Birth: 18-Jan-1966

## 2018-06-18 ENCOUNTER — Encounter: Payer: Self-pay | Admitting: Physical Therapy

## 2018-06-18 ENCOUNTER — Encounter: Payer: Self-pay | Admitting: Occupational Therapy

## 2018-06-18 ENCOUNTER — Ambulatory Visit: Payer: Commercial Indemnity | Admitting: Occupational Therapy

## 2018-06-18 ENCOUNTER — Ambulatory Visit: Payer: Commercial Indemnity | Admitting: Physical Therapy

## 2018-06-18 ENCOUNTER — Ambulatory Visit: Payer: Self-pay | Admitting: Physical Therapy

## 2018-06-18 ENCOUNTER — Other Ambulatory Visit: Payer: Self-pay

## 2018-06-18 DIAGNOSIS — R278 Other lack of coordination: Secondary | ICD-10-CM

## 2018-06-18 DIAGNOSIS — M6281 Muscle weakness (generalized): Secondary | ICD-10-CM

## 2018-06-18 DIAGNOSIS — I69351 Hemiplegia and hemiparesis following cerebral infarction affecting right dominant side: Secondary | ICD-10-CM

## 2018-06-18 DIAGNOSIS — R2689 Other abnormalities of gait and mobility: Secondary | ICD-10-CM

## 2018-06-18 DIAGNOSIS — R2681 Unsteadiness on feet: Secondary | ICD-10-CM

## 2018-06-18 NOTE — Therapy (Signed)
[facility] [address] [place], [place], [address] Phone: [phone]   Fax:  [phone]  Physical Therapy Treatment  Patient Details  Name: Arthur Howard MRN: [id number] Date of Birth: 1966/02/28 Referring Provider (PT): Angiulli, Chapman Fitch (from hospital-f/u to be with Sherald Barge, NP)   Encounter Date: 06/18/2018  PT End of Session - 06/18/18 1136    Visit Number  5    Number of Visits  22    Date for PT Re-Evaluation  08/25/18    Authorization Type  [employer]-60 visits combined PT and OT if on same day    Authorization - Visit Number  5    Authorization - Number of Visits  60    PT Start Time  2025    PT Stop Time  1100    PT Time Calculation (min)  45 min    Equipment Utilized During Treatment  Other (comment)    Activity Tolerance  Patient tolerated treatment well;No increased pain    Behavior During Therapy  [facility] for tasks assessed/performed       Past Medical History:  Diagnosis Date  . Diabetes mellitus without complication ([place])   . Hyperlipidemia     Past Surgical History:  Procedure Laterality Date  . ANKLE SURGERY      There were no vitals filed for this visit.  Subjective Assessment - 06/18/18 1013    Subjective  No new compaints. No falls or pain. When asked, states his home program is too easy.     Patient is accompained by:  Family member    Patient Stated Goals  Pt's goal is to improve R hand, arm, leg strength.    Currently in Pain?  Yes    Pain Score  3     Pain Location  Arm    Pain Orientation  Right    Pain Descriptors / Indicators  Stabbing    Pain Type  Acute pain    Pain Onset  1 to 4 weeks ago    Pain Frequency  Intermittent    Aggravating Factors   malpositioning, sleeping    Pain Relieving Factors  repositioning         Treatment- All activities done after having pt remove his rt AFO.  Neuro re-ed- during all activities focused on engaging right hip  without retraction, controlling right knee in weight-bearing to prevent hyperextension, timing and coordination of muscle firing:  1) Tandem walking forward and backward with cues to SLOW down (these cues for basically all activities), then added head turns left and right;  2) toe walking added head turns; heel walking pt with awkward elevating right shoulder and poor hip alignment with great difficulty correcting even with manual facilitation and removed from his HEP;  3) unilateral heel raises on right x 5 and very fatigued with poor lift of heel and changed to bil heel raise, lift left leg, and lower using right foot only x 10 4) wall slides to thighs parallel to the floor, slowly x 10; added 3 second hold 2nd set x 10 5) bridge, progressed to bridge with 5 second hold, progressed to marching bridge x 10, x 15, x 10  Gait training- in // bars, pre-gait anterior-posterior wt-shift with right foot forward, emphasizing standing tall/strong over right foot. Pt initially with great difficulty understanding (despite visual demonstration, family member interpreting verbal cues, and manual facilitation--ultimately OT came over to assist in facilitating hips/trunk as PT down on foot and knee).  With repetition, pt progressed to good technique with light support of left hand on // bar and controlling rt knee in stance.   Gait on treadmill-only 1.2 mph initially to emphasize rt heel to toe-off progression and rt knee control; eventually upped to 1.5 mph focusing on not retracting and hiking rt hip; total time 7 minutes (stopped as pt fatigued to point of rt knee hyperextending)                       PT Education - 06/18/18 1136    Education Details  updated HEP    Person(s) Educated  Patient;Other (comment)   sister   Methods  Explanation;Demonstration;Tactile cues;Verbal cues;Handout    Comprehension  Verbalized understanding;Returned demonstration;Verbal cues required;Tactile cues  required;Need further instruction       PT Short Term Goals - 05/27/18 1218      PT SHORT TERM GOAL #1   Title  Pt will be independent with HEP for improved transfers, balance, gait and strength.  TARGET 06/25/2018    Time  5    Period  Weeks    Status  New    Target Date  06/25/18      PT SHORT TERM GOAL #2   Title  Pt will improve 5x sit<>stand to less than or equal to 12.5 seconds for improved transfer efficiency and lower extremity strength.    Time  5    Period  Weeks    Status  New    Target Date  06/25/18      PT SHORT TERM GOAL #3   Title  Pt will improve TUG score to less than or equal to 13.5 sec for decreased fall risk.    Time  5    Period  Weeks    Status  New    Target Date  06/25/18      PT SHORT TERM GOAL #4   Title  Pt will improve DGI score to at least 17/24 for decreased fall risk.    Time  5    Period  Weeks    Status  New    Target Date  06/25/18      PT SHORT TERM GOAL #5   Title  Pt will negotiate 4 steps with alternating pattern, 1 rail modified independently, for preparation for return to work activities.    Time  5    Period  Weeks    Status  New    Target Date  06/25/18      Additional Short Term Goals   Additional Short Term Goals  Yes      PT SHORT TERM GOAL #6   Title  Pt will verbalize understanding of fall prevention in home environment.    Time  5    Period  Weeks    Target Date  06/25/18        PT Long Term Goals - 05/27/18 1221      PT LONG TERM GOAL #1   Title  Pt will be independent with progressive HEP for strength, balance, and gait.  TARGET 07/23/2018    Time  9    Period  Weeks    Status  New    Target Date  07/23/18      PT LONG TERM GOAL #2   Title  Pt will perform 8 of 10 reps from surfaces <18" with no UE support, for improved transfer efficeincy and improved lower extremity strength.    Time  9    Period  Weeks    Status  New    Target Date  07/23/18      PT LONG TERM GOAL #3   Title  Pt will improve DGI  score to at least 19/24 for decreased fall risk.    Time  9    Period  Weeks    Status  New    Target Date  07/23/18      PT LONG TERM GOAL #4   Title  Pt will ambulate at least 1000 ft, independently, no LOB, indoor and outdoor surfaces, for improved community gait.    Time  9    Period  Weeks    Status  New    Target Date  07/23/18      PT LONG TERM GOAL #5   Title  Pt will perform floor>stand transfer independently, for safe fall recovery/floor activities/return to work.    Time  9    Period  Weeks    Status  New    Target Date  07/23/18            Plan - 06/18/18 1137    Clinical Impression Statement  Skilled session included updating his HEP as he has already significantly progressed. HEP includes LE strengthening, balance, and gait training. Sister present to translate for patient (as pt has chosen not to use an interpreter) and given opportunity to ask questions throughout session. Patient agreed the updated HEP is more difficult. He can continue to benefit from PT to work toward goals.     Personal Factors and Comorbidities  Comorbidity 2;Past/Current Experience   Independent and active prior to CVA   Comorbidities  hyperlipidemia, DM    Examination-Activity Limitations  Carry;Stairs;Locomotion Level;Transfers    Examination-Participation Restrictions  Community Activity;[employer], Work   Database administrator  Good    PT Frequency  Other (comment)   2x/wk for eval week; then 3x/wk for 4 weeks, 2x/wk 4 weeks   PT Duration  Other (comment)   total = 9 weeks POC   PT Treatment/Interventions  ADLs/Self Care Home Management;Electrical Stimulation;DME Instruction;Gait training;Stair training;Functional mobility training;Therapeutic activities;Therapeutic exercise;Orthotic Fit/Training;Patient/family education;Neuromuscular re-education;Balance training;Manual techniques    PT Next Visit Plan   continue to use Bioness concurrent with strengthening (pt chose not to on 3/27--I did not have it out and ready to go, so I think it was to save time more than anything), check his performance of anterior-posterior wt-shift in staggered stance (added to HEP 3/27); balance and gait. address trunk control/strengthening as well. needs ankle stability/strengthening as well    PT Home Exercise Plan  Access Code: [email or web address] , plus gastroc stretch    Consulted and Agree with Plan of Care  Patient;Family member/caregiver    Family Member Consulted  Daughter       Patient will benefit from skilled therapeutic intervention in order to improve the following deficits and impairments:  Abnormal gait, Decreased balance, Decreased mobility, Difficulty walking, Decreased strength, Impaired flexibility, Impaired tone, Postural dysfunction  Visit Diagnosis: Hemiplegia and hemiparesis following cerebral infarction affecting right dominant side (HCC)  Muscle weakness (generalized)  Unsteadiness on feet  Other abnormalities of gait and mobility     Problem List Patient Active Problem List   Diagnosis Date Noted  . Left pontine cerebrovascular accident ([place]) 05/11/2018  . Tobacco abuse   . Uncontrolled type 2 diabetes mellitus with hyperglycemia ([place])   . CVA (  cerebral vascular accident) ([place]) 05/06/2018  . Hyperlipidemia 05/30/2017    Rexanne Mano, PT 06/18/2018, 12:15 PM  [facility] [address] [place], [place], [address] Phone: [phone]   Fax:  [phone]  Name: Arthur Howard MRN: [id number] Date of Birth: 11-Jun-1965

## 2018-06-18 NOTE — Patient Instructions (Addendum)
Access Code: [email or web address]  URL: [email or web address]  Date: 06/18/2018  Prepared by: Barry Brunner   Exercises (UPDATED 3/27--most instructions were altered/updated from the original) Marching Bridge - 10-20 reps - 1 sets - - hold - 1x daily - 5x weekly  Sit to/from Stand in Stride position - 15-20 reps - 1 sets - 1x daily - 5x weekly  Stride Stance Weight Shift - 20 reps - 1 sets - - hold - 3x daily - 7x weekly  Tandem Walking with Counter Support - 3 reps - 1 sets - 1x daily - 5x weekly  Toe Walking with Counter Support - 3 reps - 1 sets - 1x daily - 5x weekly  Heel Raise - 10 reps - 1 sets - - hold - 1x daily - 5x weekly  Foam-feet together, EC, head turns - 10 reps - 1 sets - - hold - 1x daily - 5x weekly

## 2018-06-18 NOTE — Therapy (Signed)
[facility] [address] [place], [place], [address] Phone: [phone]   Fax:  [phone]  Occupational Therapy Treatment  Patient Details  Name: Arthur Howard MRN: [id number] Date of Birth: Jan 20, 1966 No data recorded  Encounter Date: 06/18/2018  OT End of Session - 06/18/18 1328    Visit Number  5    Number of Visits  33    Date for OT Re-Evaluation  08/25/18    Authorization Time Period  12 weeks, 60 visit limit, counts as 1 visit if PT/OT    Authorization - Visit Number  5    Authorization - Number of Visits  47    OT Start Time  1145    OT Stop Time  1230    OT Time Calculation (min)  45 min    Activity Tolerance  Patient tolerated treatment well    Behavior During Therapy  [facility] for tasks assessed/performed       Past Medical History:  Diagnosis Date  . Diabetes mellitus without complication ([place])   . Hyperlipidemia     Past Surgical History:  Procedure Laterality Date  . ANKLE SURGERY      There were no vitals filed for this visit.  Subjective Assessment - 06/18/18 1327    Patient Stated Goals  recover use of RUE    Currently in Pain?  Yes    Pain Score  3     Pain Location  Shoulder    Pain Orientation  Right    Pain Descriptors / Indicators  Aching    Pain Type  Acute pain    Pain Onset  1 to 4 weeks ago    Pain Frequency  Intermittent    Aggravating Factors   malpositioning    Pain Relieving Factors  repositioning    Multiple Pain Sites  No               Treatment:Treatment: Supine joint mobs to RUE followed by . Closed chain shoulder flexion with therapist  facilitating scapular positioning, then unilateral AA/ROM Standing AA/ROM shoulder flexion and circumduction with UE ranger, mod facilitation Seated weightbearing through RUE elbow then hand for body on arm movements, min v.c, min-mod facilitation, Seated on ball pt performed lateral weight shifts and a-p tilts for  increased trunk mobility, min faciliation Standing at mat to roll ball forwards and backwards, min v.c Attempted quadraped however wrist discomfort so task was discontinued. Passive stretch to right wrist with gentle distraction, no reports of pain Low to mid range closed chain shoulder flexion, with min-mod facilitation with emphasis on normal movement.                OT Short Term Goals - 06/08/18 1644      OT SHORT TERM GOAL #1   Title  I with inital HEP.    Time  6    Period  Weeks    Status  On-going    Target Date  07/11/18      OT SHORT TERM GOAL #2   Title  Pt will demonstrate  improved RUE functional use as evidenced by increasing box/ blocks score by 8 blocks.    Baseline  RUE 48, LUE 75    Time  6    Period  Weeks    Status  On-going      OT SHORT TERM GOAL #3   Title  Pt will demonstrate ability to retrieve a light weight object from overhead shelf at  120 shoulder flexion with no more than min compensations.    Time  6    Period  Weeks    Status  On-going      OT SHORT TERM GOAL #4   Title  Pt will perfrom all basic ADLS modified independently.    Time  6    Period  Weeks    Status  On-going      OT SHORT TERM GOAL #5   Title  Pt will increase RUE grip strength to 35 lbs of greater for increased functional use.    Time  6    Period  Weeks    Status  On-going        OT Long Term Goals - 05/27/18 1326      OT LONG TERM GOAL #1   Title  Pt will demonstrate ability to retrieve a 3 lbs weight from overhead shelf demonstrating good control with RUE.    Time  12    Period  Weeks    Status  New    Target Date  08/25/18      OT LONG TERM GOAL #2   Title  Pt will perform mod complex home managment modified independently.    Time  12    Period  Weeks    Status  New      OT LONG TERM GOAL #3   Title  Pt will demonstrate improved RUE fine motor coordination for ADLS as evidenced by completing 9 hole peg test in 38 secs or less.    Time  12     Period  Weeks    Status  New      OT LONG TERM GOAL #4   Title  Pt will report that he has returned to using his RUE as dominant hand at least 75% of the time for ADLs/IADLs..    Time  12    Period  Weeks    Status  New      OT LONG TERM GOAL #5   Title  Pt will demonstrate ability to write his name and address legibily using RUE.    Time  12    Period  Weeks    Status  New      Long Term Additional Goals   Additional Long Term Goals  Yes      OT LONG TERM GOAL #6   Title  Pt will perform simulated work activities modified indpendently demonstrating good safety awareness.    Time  12    Period  Weeks    Status  New            Plan - 06/18/18 1328    Clinical Impression Statement  Patient is progressing towards goals. He demonstrates improving awareness of right shoulder positioning.     Occupational performance deficits (Please refer to evaluation for details):  ADL's;IADL's;Work;Leisure;Social Participation    Body Structure / Function / Physical Skills  ADL;Dexterity;Flexibility;ROM;Strength;IADL;FMC;Coordination;Balance;UE functional use;GMC;Gait;Endurance;Decreased knowledge of precautions;Sensation    Rehab Potential  Good    OT Frequency  3x / week    OT Duration  --   x4 weeks followed by 2x week x 4 weeks   OT Treatment/Interventions  Self-care/ADL training;Cryotherapy;Paraffin;Therapeutic exercise;DME and/or AE instruction;Functional Mobility Training;Balance training;Splinting;Manual Therapy;Neuromuscular education;Fluidtherapy;Ultrasound;Aquatic Therapy;Electrical Stimulation;Moist Heat;Contrast Bath;Energy conservation;Passive range of motion;Therapeutic activities;Patient/family education    Plan  NMR RUE, progress HEP as able    OT Home Exercise Plan  HEP -pt is currently perfroming closed chain shoulder flexion in supine,  putty ex, and coordination activities    Consulted and Agree with Plan of Care  Patient;Family member/caregiver    Family Member Consulted   dtr who interprets for him       Patient will benefit from skilled therapeutic intervention in order to improve the following deficits and impairments:     Visit Diagnosis: No diagnosis found.    Problem List Patient Active Problem List   Diagnosis Date Noted  . Left pontine cerebrovascular accident ([place]) 05/11/2018  . Tobacco abuse   . Uncontrolled type 2 diabetes mellitus with hyperglycemia ([place])   . CVA (cerebral vascular accident) ([place]) 05/06/2018  . Hyperlipidemia 05/30/2017    Tyresha Fede 06/18/2018, 1:31 PM  [facility] [address] [place], [place], [address] Phone: [phone]   Fax:  [phone]  Name: Arthur Howard MRN: [id number] Date of Birth: [birth year]

## 2018-06-21 ENCOUNTER — Other Ambulatory Visit: Payer: Self-pay

## 2018-06-21 ENCOUNTER — Ambulatory Visit: Payer: Commercial Indemnity | Admitting: Occupational Therapy

## 2018-06-21 ENCOUNTER — Ambulatory Visit: Payer: Commercial Indemnity | Admitting: Physical Therapy

## 2018-06-21 ENCOUNTER — Encounter: Payer: Self-pay | Admitting: Occupational Therapy

## 2018-06-21 DIAGNOSIS — R2689 Other abnormalities of gait and mobility: Secondary | ICD-10-CM | POA: Diagnosis not present

## 2018-06-21 DIAGNOSIS — R2681 Unsteadiness on feet: Secondary | ICD-10-CM

## 2018-06-21 DIAGNOSIS — I69351 Hemiplegia and hemiparesis following cerebral infarction affecting right dominant side: Secondary | ICD-10-CM

## 2018-06-21 DIAGNOSIS — M6281 Muscle weakness (generalized): Secondary | ICD-10-CM

## 2018-06-21 DIAGNOSIS — R278 Other lack of coordination: Secondary | ICD-10-CM

## 2018-06-21 DIAGNOSIS — R293 Abnormal posture: Secondary | ICD-10-CM

## 2018-06-21 NOTE — Therapy (Signed)
[facility] [address] [place], [place], [address] Phone: [phone]   Fax:  [phone]  Physical Therapy Treatment  Patient Details  Name: Arthur Howard MRN: [id number] Date of Birth: Dec 21, 1965 Referring Provider (PT): Angiulli, Chapman Fitch (from hospital-f/u to be with Sherald Barge, NP)   Encounter Date: 06/21/2018  PT End of Session - 06/21/18 2026    Visit Number  6    Number of Visits  22    Date for PT Re-Evaluation  08/25/18    Authorization Type  [employer]-60 visits combined PT and OT if on same day    Authorization - Visit Number  6    Authorization - Number of Visits  60    PT Start Time  2683    PT Stop Time  1100    PT Time Calculation (min)  45 min    Activity Tolerance  Patient tolerated treatment well    Behavior During Therapy  [facility] for tasks assessed/performed       Past Medical History:  Diagnosis Date  . Diabetes mellitus without complication ([place])   . Hyperlipidemia     Past Surgical History:  Procedure Laterality Date  . ANKLE SURGERY      There were no vitals filed for this visit.  Subjective Assessment - 06/21/18 2016    Subjective  Pt reports he is still having pain in his Rt shoulder and fingers; says the new exercises given to him on Friday last week are going well    Patient is accompained by:  Family member    Patient Stated Goals  Pt's goal is to improve R hand, arm, leg strength.                       [facility] PT Treatment/Exercise - 06/21/18 1028      Ambulation/Gait   Ambulation/Gait  Yes    Ambulation/Gait Assistance  5: Supervision    Ambulation/Gait Assistance Details  cues for increased Rt initial heel contact and push off as able    No Bioness used; Rt AFO removed during session   Ambulation Distance (Feet)  250 Feet    Assistive device  None   AFO removed during PT session   Gait Pattern  Step-through pattern    Ambulation Surface   Level;Indoor      Exercises   Exercises  Knee/Hip;Ankle      Knee/Hip Exercises: Standing   Heel Raises  Both;1 set;10 reps    Forward Step Up  Right;1 set;10 reps;Hand Hold: 0;Step Height: 6"   tactile cue to prevent knee hyperextension   Other Standing Knee Exercises  tap ups to 1st step with LLE with Rt knee slightly flexed; tap ups to 2nd step with Lt foot with cues to keep Rt knee slightly flexed - minimal UE support     Other Standing Knee Exercises  Pt performed closed chain Rt plantarflexion off side of high/low mat table 3 sets 10 reps with Lt foot on balance bubble with CGA      Knee/Hip Exercises: Prone   Hamstring Curl  2 sets;3 seconds   5# weight used on RLE   Hip Extension  AROM;Right;2 sets;10 reps   Rt hip extension with knee flexed     Elliptical - 1" forward level 1.5;  30 secs backward with assistance (pt fatigued) so stopped at 30 secs (level 1.5)    Balance Exercises - 06/21/18 2024  Balance Exercises: Standing   Rockerboard  Anterior/posterior;EO;Other reps (comment)   3 sets 10 reps with tactile cues to maintain hip extension   Other Standing Exercises  crossovers, stepping behind and then braiding performed inside // bars 2 reps each with UE support prn       Pt performed jumping, 1/2 jumping jacks and lunges - 3 -5 reps each inside // bars with UE support prn   PT Short Term Goals - 05/27/18 1218      PT SHORT TERM GOAL #1   Title  Pt will be independent with HEP for improved transfers, balance, gait and strength.  TARGET 06/25/2018    Time  5    Period  Weeks    Status  New    Target Date  06/25/18      PT SHORT TERM GOAL #2   Title  Pt will improve 5x sit<>stand to less than or equal to 12.5 seconds for improved transfer efficiency and lower extremity strength.    Time  5    Period  Weeks    Status  New    Target Date  06/25/18      PT SHORT TERM GOAL #3   Title  Pt will improve TUG score to less than or equal to 13.5 sec for decreased  fall risk.    Time  5    Period  Weeks    Status  New    Target Date  06/25/18      PT SHORT TERM GOAL #4   Title  Pt will improve DGI score to at least 17/24 for decreased fall risk.    Time  5    Period  Weeks    Status  New    Target Date  06/25/18      PT SHORT TERM GOAL #5   Title  Pt will negotiate 4 steps with alternating pattern, 1 rail modified independently, for preparation for return to work activities.    Time  5    Period  Weeks    Status  New    Target Date  06/25/18      Additional Short Term Goals   Additional Short Term Goals  Yes      PT SHORT TERM GOAL #6   Title  Pt will verbalize understanding of fall prevention in home environment.    Time  5    Period  Weeks    Target Date  06/25/18        PT Long Term Goals - 05/27/18 1221      PT LONG TERM GOAL #1   Title  Pt will be independent with progressive HEP for strength, balance, and gait.  TARGET 07/23/2018    Time  9    Period  Weeks    Status  New    Target Date  07/23/18      PT LONG TERM GOAL #2   Title  Pt will perform 8 of 10 reps from surfaces <18" with no UE support, for improved transfer efficeincy and improved lower extremity strength.    Time  9    Period  Weeks    Status  New    Target Date  07/23/18      PT LONG TERM GOAL #3   Title  Pt will improve DGI score to at least 19/24 for decreased fall risk.    Time  9    Period  Weeks    Status  New  Target Date  07/23/18      PT LONG TERM GOAL #4   Title  Pt will ambulate at least 1000 ft, independently, no LOB, indoor and outdoor surfaces, for improved community gait.    Time  9    Period  Weeks    Status  New    Target Date  07/23/18      PT LONG TERM GOAL #5   Title  Pt will perform floor>stand transfer independently, for safe fall recovery/floor activities/return to work.    Time  9    Period  Weeks    Status  New    Target Date  07/23/18            Plan - 06/21/18 2027    Clinical Impression Statement  Pt able  to perform unilateral Rt heel raise today - improved as pt unable to perform this movement on 06-18-18 per chart note.  AFO removed during session; pt able to reduce gait deviations with verbal cues, demonstrating increased initial heel contact and push off in stance.    Comorbidities  hyperlipidemia, DM    Rehab Potential  Good    PT Frequency  Other (comment)    PT Duration  Other (comment)    PT Treatment/Interventions  ADLs/Self Care Home Management;Electrical Stimulation;DME Instruction;Gait training;Stair training;Functional mobility training;Therapeutic activities;Therapeutic exercise;Orthotic Fit/Training;Patient/family education;Neuromuscular re-education;Balance training;Manual techniques    PT Next Visit Plan  cont RLE strengthening - Rt gastrocs/knee control     PT Home Exercise Plan  Access Code: [email or web address] , plus gastroc stretch    Consulted and Agree with Plan of Care  Patient    Family Member Consulted  Daughter       Patient will benefit from skilled therapeutic intervention in order to improve the following deficits and impairments:  Abnormal gait, Decreased balance, Decreased mobility, Difficulty walking, Decreased strength, Impaired flexibility, Impaired tone, Postural dysfunction  Visit Diagnosis: Other abnormalities of gait and mobility  Muscle weakness (generalized)  Unsteadiness on feet     Problem List Patient Active Problem List   Diagnosis Date Noted  . Left pontine cerebrovascular accident ([place]) 05/11/2018  . Tobacco abuse   . Uncontrolled type 2 diabetes mellitus with hyperglycemia ([place])   . CVA (cerebral vascular accident) ([place]) 05/06/2018  . Hyperlipidemia 05/30/2017    [place], PT 06/21/2018, 8:32 PM  [facility] [address] [place], [place], [address] Phone: [phone]   Fax:  [phone]  Name: Arthur Howard MRN: [id number] Date of Birth: 09-21-1965

## 2018-06-21 NOTE — Therapy (Signed)
[facility] [address] [place], [place], [address] Phone: [phone]   Fax:  [phone]  Occupational Therapy Treatment  Patient Details  Name: Arthur Howard MRN: [id number] Date of Birth: 1966-03-14 No data recorded  Encounter Date: 06/21/2018  OT End of Session - 06/21/18 1315    Visit Number  6    Number of Visits  33    Date for OT Re-Evaluation  08/25/18    Authorization Type  [employer]    Authorization Time Period  12 weeks, 60 visit limit, counts as 1 visit if PT/OT    Authorization - Visit Number  6    Authorization - Number of Visits  75    OT Start Time  1104    OT Stop Time  1145    OT Time Calculation (min)  41 min    Activity Tolerance  Patient tolerated treatment well       Past Medical History:  Diagnosis Date  . Diabetes mellitus without complication ([place])   . Hyperlipidemia     Past Surgical History:  Procedure Laterality Date  . ANKLE SURGERY      There were no vitals filed for this visit.  Subjective Assessment - 06/21/18 1111    Subjective   I still have pain but its getting better    Pertinent History  CVA, DM, hyperlipidemia    Limitations  non English speaking    Patient Stated Goals  recover use of RUE    Currently in Pain?  Yes    Pain Score  4     Pain Location  Shoulder    Pain Orientation  Right    Pain Descriptors / Indicators  Aching    Pain Type  Neuropathic pain    Pain Onset  1 to 4 weeks ago    Aggravating Factors   pain is worse in the morninngs    Pain Relieving Factors  repositioning    Multiple Pain Sites  Yes    Pain Score  4    Pain Location  Hand    Pain Orientation  Right    Pain Descriptors / Indicators  --   like someone is hitting it with a hammer   Pain Type  Acute pain    Pain Onset  1 to 4 weeks ago    Pain Frequency  Intermittent    Aggravating Factors   not sure - the pain seems to get a little better during the day - just when I try and  move the arm                   OT Treatments/Exercises (OP) - 06/21/18 0001      Neurological Re-education Exercises   Other Exercises 1  Neuro re ed to address body on arm for A/P and lateral weight shfits with BUE's in in weight bearing to sides of patient.  Pt needs cues for lower trunk initiated movement as pt tends to initiate all movement from his head, neck and with shoulder hiking. Pt quickly responded to handling techniques for improved initiation, grading , direction and control of movements. Progressed to closed chain mid reach with manual resistance and holding to address RUE and trunk activation in sitting. Transitioned into supine to address development of HEP for pt - pt has large red therapy ball at home and able to use for UE HEP - educated in chest presses and overhead reach using ball in supine. After  instruction and practice pt able to return demonstrate.  Stressed importance of alignment, and pain free ROM with HEP. Pt and family able to verbalize understanding and reinforced with handout.  See pt instruction section for details. Transitioned into sitting and addressed open chain mid to beginning high reach with min facilitation for scapular alignment and more normalized reach pattern.  By end of session pt reported 0/10 pain with reaching activities              OT Education - 06/21/18 1312    Education Details  HEP for RUE     Person(s) Educated  Patient;Child(ren)    Methods  Explanation;Demonstration;Handout    Comprehension  Verbalized understanding;Returned demonstration;Verbal cues required   family to provide vc's      OT Short Term Goals - 06/08/18 1644      OT SHORT TERM GOAL #1   Title  I with inital HEP.    Time  6    Period  Weeks    Status  On-going    Target Date  07/11/18      OT SHORT TERM GOAL #2   Title  Pt will demonstrate  improved RUE functional use as evidenced by increasing box/ blocks score by 8 blocks.    Baseline  RUE  48, LUE 75    Time  6    Period  Weeks    Status  On-going      OT SHORT TERM GOAL #3   Title  Pt will demonstrate ability to retrieve a light weight object from overhead shelf at 120 shoulder flexion with no more than min compensations.    Time  6    Period  Weeks    Status  On-going      OT SHORT TERM GOAL #4   Title  Pt will perfrom all basic ADLS modified independently.    Time  6    Period  Weeks    Status  On-going      OT SHORT TERM GOAL #5   Title  Pt will increase RUE grip strength to 35 lbs of greater for increased functional use.    Time  6    Period  Weeks    Status  On-going        OT Long Term Goals - 05/27/18 1326      OT LONG TERM GOAL #1   Title  Pt will demonstrate ability to retrieve a 3 lbs weight from overhead shelf demonstrating good control with RUE.    Time  12    Period  Weeks    Status  New    Target Date  08/25/18      OT LONG TERM GOAL #2   Title  Pt will perform mod complex home managment modified independently.    Time  12    Period  Weeks    Status  New      OT LONG TERM GOAL #3   Title  Pt will demonstrate improved RUE fine motor coordination for ADLS as evidenced by completing 9 hole peg test in 38 secs or less.    Time  12    Period  Weeks    Status  New      OT LONG TERM GOAL #4   Title  Pt will report that he has returned to using his RUE as dominant hand at least 75% of the time for ADLs/IADLs..    Time  12    Period  Weeks    Status  New      OT LONG TERM GOAL #5   Title  Pt will demonstrate ability to write his name and address legibily using RUE.    Time  12    Period  Weeks    Status  New      Long Term Additional Goals   Additional Long Term Goals  Yes      OT LONG TERM GOAL #6   Title  Pt will perform simulated work activities modified indpendently demonstrating good safety awareness.    Time  12    Period  Weeks    Status  New            Plan - 06/21/18 1313    Clinical Impression Statement  Pt  progresssing toward goals and demonstrating signficantly reduced pain in R shoulder    OT Occupational Profile and History  Detailed Assessment- Review of Records and additional review of physical, cognitive, psychosocial history related to current functional performance    Occupational performance deficits (Please refer to evaluation for details):  ADL's;IADL's;Work;Leisure;Social Participation    Body Structure / Function / Physical Skills  ADL;Dexterity;Flexibility;ROM;Strength;IADL;FMC;Coordination;Balance;UE functional use;GMC;Gait;Endurance;Decreased knowledge of precautions;Sensation    Rehab Potential  Good    Clinical Decision Making  Limited treatment options, no task modification necessary    Comorbidities Affecting Occupational Performance:  May have comorbidities impacting occupational performance    Modification or Assistance to Complete Evaluation   No modification of tasks or assist necessary to complete eval    OT Frequency  3x / week    OT Duration  --   x4 weeks followed by 2x/wk x4 weeks   OT Treatment/Interventions  Self-care/ADL training;Cryotherapy;Paraffin;Therapeutic exercise;DME and/or AE instruction;Functional Mobility Training;Balance training;Splinting;Manual Therapy;Neuromuscular education;Fluidtherapy;Ultrasound;Aquatic Therapy;Electrical Stimulation;Moist Heat;Contrast Bath;Energy conservation;Passive range of motion;Therapeutic activities;Patient/family education    Plan  NMR RUE/trunk, check HEP, closed chain to open chain reach     Consulted and Agree with Plan of Care  Patient;Family member/caregiver       Patient will benefit from skilled therapeutic intervention in order to improve the following deficits and impairments:  Body Structure / Function / Physical Skills  Visit Diagnosis: Hemiplegia and hemiparesis following cerebral infarction affecting right dominant side ([facility])  Muscle weakness (generalized)  Unsteadiness on feet  Other lack of  coordination  Abnormal posture    Problem List Patient Active Problem List   Diagnosis Date Noted  . Left pontine cerebrovascular accident ([place]) 05/11/2018  . Tobacco abuse   . Uncontrolled type 2 diabetes mellitus with hyperglycemia ([place])   . CVA (cerebral vascular accident) ([place]) 05/06/2018  . Hyperlipidemia 05/30/2017    Quay Burow, OTR/L 06/21/2018, 1:16 PM  [facility] [address] [place], [place], [address] Phone: [phone]   Fax:  [phone]  Name: Arthur Howard MRN: [id number] Date of Birth: 1965/10/29

## 2018-06-22 ENCOUNTER — Ambulatory Visit: Payer: Self-pay | Admitting: Physical Therapy

## 2018-06-22 ENCOUNTER — Encounter: Payer: Self-pay | Admitting: Occupational Therapy

## 2018-06-23 ENCOUNTER — Ambulatory Visit: Payer: Self-pay | Admitting: Physical Therapy

## 2018-06-23 ENCOUNTER — Encounter: Payer: Self-pay | Admitting: Physical Therapy

## 2018-06-23 ENCOUNTER — Encounter: Payer: Self-pay | Admitting: Occupational Therapy

## 2018-06-23 ENCOUNTER — Ambulatory Visit: Payer: Commercial Indemnity | Admitting: Physical Therapy

## 2018-06-23 ENCOUNTER — Ambulatory Visit: Payer: Commercial Indemnity | Admitting: Occupational Therapy

## 2018-06-23 DIAGNOSIS — I69351 Hemiplegia and hemiparesis following cerebral infarction affecting right dominant side: Secondary | ICD-10-CM

## 2018-06-23 DIAGNOSIS — R2689 Other abnormalities of gait and mobility: Secondary | ICD-10-CM

## 2018-06-23 DIAGNOSIS — R278 Other lack of coordination: Secondary | ICD-10-CM

## 2018-06-23 DIAGNOSIS — I6350 Cerebral infarction due to unspecified occlusion or stenosis of unspecified cerebral artery: Secondary | ICD-10-CM | POA: Diagnosis present

## 2018-06-23 DIAGNOSIS — E7849 Other hyperlipidemia: Secondary | ICD-10-CM | POA: Diagnosis present

## 2018-06-23 DIAGNOSIS — R293 Abnormal posture: Secondary | ICD-10-CM | POA: Insufficient documentation

## 2018-06-23 DIAGNOSIS — R2681 Unsteadiness on feet: Secondary | ICD-10-CM

## 2018-06-23 DIAGNOSIS — M6281 Muscle weakness (generalized): Secondary | ICD-10-CM | POA: Insufficient documentation

## 2018-06-23 DIAGNOSIS — E1165 Type 2 diabetes mellitus with hyperglycemia: Secondary | ICD-10-CM | POA: Diagnosis present

## 2018-06-23 NOTE — Therapy (Signed)
[facility] [address] [place], [place], [address] Phone: [phone]   Fax:  [phone]  Physical Therapy Treatment  Patient Details  Name: Arthur Howard MRN: [id number] Date of Birth: [birth year] Referring Provider (PT): Angiulli, Chapman Fitch (from hospital-f/u to be with Sherald Barge, NP)   Encounter Date: 06/23/2018  PT End of Session - 06/23/18 1105    Visit Number  7    Number of Visits  22    Date for PT Re-Evaluation  08/25/18    Authorization Type  [employer]-60 visits combined PT and OT if on same day    Authorization - Visit Number  7    Authorization - Number of Visits  60    PT Start Time  1104    PT Stop Time  1144    PT Time Calculation (min)  40 min    Equipment Utilized During Treatment  Gait belt    Activity Tolerance  Patient tolerated treatment well    Behavior During Therapy  [facility] for tasks assessed/performed       Past Medical History:  Diagnosis Date  . Diabetes mellitus without complication ([place])   . Hyperlipidemia     Past Surgical History:  Procedure Laterality Date  . ANKLE SURGERY      There were no vitals filed for this visit.  Subjective Assessment - 06/23/18 1104    Subjective  Continues to have pain in right shoulder. HEP is going well..    Patient is accompained by:  Family member    Patient Stated Goals  Pt's goal is to improve R hand, arm, leg strength.    Currently in Pain?  Yes    Pain Score  2     Pain Location  Shoulder    Pain Orientation  Right    Pain Descriptors / Indicators  Aching    Pain Type  Neuropathic pain    Pain Onset  More than a month ago    Pain Frequency  Intermittent    Aggravating Factors   worse in the am    Pain Relieving Factors  repositioning         [facility] PT Assessment - 06/23/18 1107      Transfers   Transfers  Sit to Stand;Stand to Sit    Sit to Stand  6: Modified independent (Device/Increase time)    Five time sit to stand  comments   9.90 sec's no UE support using standard height surface    Stand to Sit  6: Modified independent (Device/Increase time)      Ambulation/Gait   Ambulation/Gait  Yes    Assistive device  None    Stairs  Yes    Stairs Assistance  5: Supervision    Stair Management Technique  No rails;One rail Right;Alternating pattern;Step to pattern;Forwards    Number of Stairs  4   x2   Height of Stairs  6      Standardized Balance Assessment   Standardized Balance Assessment  Timed Up and Go Test;Dynamic Gait Index      Dynamic Gait Index   Level Surface  Mild Impairment    Change in Gait Speed  Normal    Gait with Horizontal Head Turns  Mild Impairment    Gait with Vertical Head Turns  Normal    Gait and Pivot Turn  Mild Impairment    Step Over Obstacle  Mild Impairment    Step Around Obstacles  Normal  Steps  Mild Impairment    Total Score  19    DGI comment:  Scores <19/24 indicate increased fall risk.      Timed Up and Go Test   Normal TUG (seconds)  7.13    TUG Comments  Scores >13.5 sec indicate increased fall risk.           [facility] Adult PT Treatment/Exercise - 06/23/18 1128      High Level Balance   High Level Balance Activities  Braiding;Marching forwards;Marching backwards;Tandem walking   tandem fwd/bwd, toe walk fwd/bwd   High Level Balance Comments  on blue mat in parallel bars: 3 laps each with intermittent touch to bars for balance, cues to slow down, for right knee control in stance and on weight shifting.       Knee/Hip Exercises: Prone   Hamstring Curl  2 sets;10 reps;Limitations    Hamstring Curl Limitations  5# weight with cues on form and slow, controlled movements    Hip Extension  AROM;Strengthening;Right;2 sets;10 reps;Limitations    Hip Extension Limitations  with 2# ankle weight, cues to maintain knee extension and for slow, controlled movements    Other Prone Exercises  glut kick backs with no weight, cues for form and slow, controlled movements.          PT Education - 06/23/18 1200    Education Details  fall prevention strategies    Person(s) Educated  Patient;Other (comment)   sister   Methods  Explanation;Demonstration;Verbal cues;Handout    Comprehension  Verbalized understanding;Returned demonstration       PT Short Term Goals - 06/23/18 1106      PT SHORT TERM GOAL #1   Title  Pt will be independent with HEP for improved transfers, balance, gait and strength.  TARGET 06/25/2018    Baseline  06/23/18: met with current program    Status  Achieved    Target Date  06/25/18      PT SHORT TERM GOAL #2   Title  Pt will improve 5x sit<>stand to less than or equal to 12.5 seconds for improved transfer efficiency and lower extremity strength.    Baseline  06/23/18: 9.90 sec's no UE support from standard height surface    Time  --    Period  --    Status  Achieved    Target Date  06/25/18      PT SHORT TERM GOAL #3   Title  Pt will improve TUG score to less than or equal to 13.5 sec for decreased fall risk.    Baseline  06/23/18: 7.13 sec's no device except for right AFO    Time  --    Period  --    Status  Achieved    Target Date  06/25/18      PT SHORT TERM GOAL #4   Title  Pt will improve DGI score to at least 17/24 for decreased fall risk.    Baseline  06/23/18: 19/24 scored today    Time  --    Period  --    Status  Achieved    Target Date  06/25/18      PT SHORT TERM GOAL #5   Title  Pt will negotiate 4 steps with alternating pattern, 1 rail modified independently, for preparation for return to work activities.    Baseline  06/23/18: met today    Time  --    Period  --    Status  Achieved    Target  Date  06/25/18      PT SHORT TERM GOAL #6   Title  Pt will verbalize understanding of fall prevention in home environment.    Baseline  06/23/18: met today    Time  --    Period  --    Status  Achieved    Target Date  06/25/18        PT Long Term Goals - 05/27/18 1221      PT LONG TERM GOAL #1   Title  Pt will  be independent with progressive HEP for strength, balance, and gait.  TARGET 07/23/2018    Time  9    Period  Weeks    Status  New    Target Date  07/23/18      PT LONG TERM GOAL #2   Title  Pt will perform 8 of 10 reps from surfaces <18" with no UE support, for improved transfer efficeincy and improved lower extremity strength.    Time  9    Period  Weeks    Status  New    Target Date  07/23/18      PT LONG TERM GOAL #3   Title  Pt will improve DGI score to at least 19/24 for decreased fall risk.    Time  9    Period  Weeks    Status  New    Target Date  07/23/18      PT LONG TERM GOAL #4   Title  Pt will ambulate at least 1000 ft, independently, no LOB, indoor and outdoor surfaces, for improved community gait.    Time  9    Period  Weeks    Status  New    Target Date  07/23/18      PT LONG TERM GOAL #5   Title  Pt will perform floor>stand transfer independently, for safe fall recovery/floor activities/return to work.    Time  9    Period  Weeks    Status  New    Target Date  07/23/18            Plan - 06/23/18 1105    Clinical Impression Statement  Today's skilled session initially focused on progress toward STGs with all goals met. Remainder of session focused on right LE strengthening and balance reactions. The pt is making steady progress and should benefit from continued PT to progress toward unmet goals.     Comorbidities  hyperlipidemia, DM    Rehab Potential  Good    PT Frequency  Other (comment)    PT Duration  Other (comment)    PT Treatment/Interventions  ADLs/Self Care Home Management;Electrical Stimulation;DME Instruction;Gait training;Stair training;Functional mobility training;Therapeutic activities;Therapeutic exercise;Orthotic Fit/Training;Patient/family education;Neuromuscular re-education;Balance training;Manual techniques    PT Next Visit Plan  cont RLE strengthening - Rt gastrocs/knee control     PT Home Exercise Plan  Access Code: [email or web address] , plus  gastroc stretch    Consulted and Agree with Plan of Care  Patient    Family Member Consulted  Daughter       Patient will benefit from skilled therapeutic intervention in order to improve the following deficits and impairments:  Abnormal gait, Decreased balance, Decreased mobility, Difficulty walking, Decreased strength, Impaired flexibility, Impaired tone, Postural dysfunction  Visit Diagnosis: Muscle weakness (generalized)  Unsteadiness on feet  Other abnormalities of gait and mobility     Problem List Patient Active Problem List   Diagnosis Date Noted  . Left pontine cerebrovascular accident ([place]) 05/11/2018  .  Tobacco abuse   . Uncontrolled type 2 diabetes mellitus with hyperglycemia ([place])   . CVA (cerebral vascular accident) ([place]) 05/06/2018  . Hyperlipidemia 05/30/2017    Willow Ora, PTA, [facility] [address], [place], [place] [address] [phone] 06/23/18, 12:27 PM   Name: TAYSON SCHNELLE MRN: [id number] Date of Birth: [birth year]

## 2018-06-23 NOTE — Patient Instructions (Addendum)
Fall Prevention in the Home, Adult  Falls can cause injuries. They can happen to people of all ages. There are many things you can do to make your home safe and to help prevent falls. Ask for help when making these changes, if needed.  What actions can I take to prevent falls?  General Instructions  · Use good lighting in all rooms. Replace any light bulbs that burn out.  · Turn on the lights when you go into a dark area. Use night-lights.  · Keep items that you use often in easy-to-reach places. Lower the shelves around your home if necessary.  · Set up your furniture so you have a clear path. Avoid moving your furniture around.  · Do not have throw rugs and other things on the floor that can make you trip.  · Avoid walking on wet floors.  · If any of your floors are uneven, fix them.  · Add color or contrast paint or tape to clearly mark and help you see:  ? Any grab bars or handrails.  ? First and last steps of stairways.  ? Where the edge of each step is.  · If you use a stepladder:  ? Make sure that it is fully opened. Do not climb a closed stepladder.  ? Make sure that both sides of the stepladder are locked into place.  ? Ask someone to hold the stepladder for you while you use it.  · If there are any pets around you, be aware of where they are.  What can I do in the bathroom?         · Keep the floor dry. Clean up any water that spills onto the floor as soon as it happens.  · Remove soap buildup in the tub or shower regularly.  · Use non-skid mats or decals on the floor of the tub or shower.  · Attach bath mats securely with double-sided, non-slip rug tape.  · If you need to sit down in the shower, use a plastic, non-slip stool.  · Install grab bars by the toilet and in the tub and shower. Do not use towel bars as grab bars.  What can I do in the bedroom?  · Make sure that you have a light by your bed that is easy to reach.  · Do not use any sheets or blankets that are too big for your bed. They should  not hang down onto the floor.  · Have a firm chair that has side arms. You can use this for support while you get dressed.  What can I do in the kitchen?  · Clean up any spills right away.  · If you need to reach something above you, use a strong step stool that has a grab bar.  · Keep electrical cords out of the way.  · Do not use floor polish or wax that makes floors slippery. If you must use wax, use non-skid floor wax.  What can I do with my stairs?  · Do not leave any items on the stairs.  · Make sure that you have a light switch at the top of the stairs and the bottom of the stairs. If you do not have them, ask someone to add them for you.  · Make sure that there are handrails on both sides of the stairs, and use them. Fix handrails that are broken or loose. Make sure that handrails are as long as the stairways.  ·   Install non-slip stair treads on all stairs in your home.  · Avoid having throw rugs at the top or bottom of the stairs. If you do have throw rugs, attach them to the floor with carpet tape.  · Choose a carpet that does not hide the edge of the steps on the stairway.  · Check any carpeting to make sure that it is firmly attached to the stairs. Fix any carpet that is loose or worn.  What can I do on the outside of my home?  · Use bright outdoor lighting.  · Regularly fix the edges of walkways and driveways and fix any cracks.  · Remove anything that might make you trip as you walk through a door, such as a raised step or threshold.  · Trim any bushes or trees on the path to your home.  · Regularly check to see if handrails are loose or broken. Make sure that both sides of any steps have handrails.  · Install guardrails along the edges of any raised decks and porches.  · Clear walking paths of anything that might make someone trip, such as tools or rocks.  · Have any leaves, snow, or ice cleared regularly.  · Use sand or salt on walking paths during winter.  · Clean up any spills in your garage right  away. This includes grease or oil spills.  What other actions can I take?  · Wear shoes that:  ? Have a low heel. Do not wear high heels.  ? Have rubber bottoms.  ? Are comfortable and fit you well.  ? Are closed at the toe. Do not wear open-toe sandals.  · Use tools that help you move around (mobility aids) if they are needed. These include:  ? Canes.  ? Walkers.  ? Scooters.  ? Crutches.  · Review your medicines with your doctor. Some medicines can make you feel dizzy. This can increase your chance of falling.  Ask your doctor what other things you can do to help prevent falls.  Where to find more information  · Centers for Disease Control and Prevention, [employer]AD[employer]: https://cdc.gov  · National Institute on Aging: https://go4life.nia.nih.gov  Contact a doctor if:  · You are afraid of falling at home.  · You feel weak, drowsy, or dizzy at home.  · You fall at home.  Summary  · There are many simple things that you can do to make your home safe and to help prevent falls.  · Ways to make your home safe include removing tripping hazards and installing grab bars in the bathroom.  · Ask for help when making these changes in your home.  This information is not intended to replace advice given to you by your health care provider. Make sure you discuss any questions you have with your health care provider.  Document Released: 01/04/2009 Document Revised: 10/23/2016 Document Reviewed: 10/23/2016  Elsevier Interactive Patient Education © 2019 Elsevier Inc[employer]

## 2018-06-23 NOTE — Therapy (Signed)
[facility] [address] [place], [place], [address] Phone: [phone]   Fax:  [phone]  Occupational Therapy Treatment  Patient Details  Name: Arthur Howard MRN: [id number] Date of Birth: [birth year] No data recorded  Encounter Date: 06/23/2018  OT End of Session - 06/23/18 1109    Visit Number  7    Number of Visits  33    Date for OT Re-Evaluation  08/25/18    Authorization Type  [employer]    Authorization Time Period  12 weeks, 60 visit limit, counts as 1 visit if PT/OT    Authorization - Visit Number  7    Authorization - Number of Visits  51    OT Start Time  6226    OT Stop Time  1100    OT Time Calculation (min)  45 min    Activity Tolerance  Patient tolerated treatment well       Past Medical History:  Diagnosis Date  . Diabetes mellitus without complication ([place])   . Hyperlipidemia     Past Surgical History:  Procedure Laterality Date  . ANKLE SURGERY      There were no vitals filed for this visit.  Subjective Assessment - 06/23/18 1013    Subjective   Per pt, end of session pain decreased to 2/10    Patient is accompanied by:  Family member   daughter   Pertinent History  CVA, DM, hyperlipidemia    Limitations  non English speaking    Patient Stated Goals  recover use of RUE    Currently in Pain?  Yes    Pain Score  4     Pain Location  Shoulder    Pain Orientation  Right    Pain Descriptors / Indicators  Aching    Pain Type  Neuropathic pain    Pain Onset  More than a month ago    Pain Frequency  Intermittent    Aggravating Factors   pain worse in am    Pain Relieving Factors  repositioning        Neuro JF:[id number]:  Supine: worked on scapula depression with arms slides toward feet, followed by BUE shoulder flexion w/ tactile cues for scapula movement during shoulder flexion, and to prevent shoulder abduction/IR Seated: wt bearing over Rt elbow w/ LUE reaches for Rt sh girdle  depression, followed by full arm wt bearing with body on arm movements. Closed chain shoulder flexion RUE at 90* w/ body on arm movements. Alternating BUE shoulder flexion in AA/ROM closed chain activity (against therapist hands) Quadraped: with adjustments for wrist pain (hands over bowls) for cat/cow stretch and A/P wt shifts.  Standing: BUE AA/ROM in shoulder flexion to roll ball up wall w/ tactile cues for shoulder girdle.  Pt required tactile cues, demo cues, and verbal cues (via daughter interpreting) to prevent compensations into sh hiking, abduction, and IR. Pain down to 2/10 at end of session                     OT Short Term Goals - 06/08/18 1644      OT SHORT TERM GOAL #1   Title  I with inital HEP.    Time  6    Period  Weeks    Status  On-going    Target Date  07/11/18      OT SHORT TERM GOAL #2   Title  Pt will demonstrate  improved RUE functional use as  evidenced by increasing box/ blocks score by 8 blocks.    Baseline  RUE 48, LUE 75    Time  6    Period  Weeks    Status  On-going      OT SHORT TERM GOAL #3   Title  Pt will demonstrate ability to retrieve a light weight object from overhead shelf at 120 shoulder flexion with no more than min compensations.    Time  6    Period  Weeks    Status  On-going      OT SHORT TERM GOAL #4   Title  Pt will perfrom all basic ADLS modified independently.    Time  6    Period  Weeks    Status  On-going      OT SHORT TERM GOAL #5   Title  Pt will increase RUE grip strength to 35 lbs of greater for increased functional use.    Time  6    Period  Weeks    Status  On-going        OT Long Term Goals - 05/27/18 1326      OT LONG TERM GOAL #1   Title  Pt will demonstrate ability to retrieve a 3 lbs weight from overhead shelf demonstrating good control with RUE.    Time  12    Period  Weeks    Status  New    Target Date  08/25/18      OT LONG TERM GOAL #2   Title  Pt will perform mod complex home  managment modified independently.    Time  12    Period  Weeks    Status  New      OT LONG TERM GOAL #3   Title  Pt will demonstrate improved RUE fine motor coordination for ADLS as evidenced by completing 9 hole peg test in 38 secs or less.    Time  12    Period  Weeks    Status  New      OT LONG TERM GOAL #4   Title  Pt will report that he has returned to using his RUE as dominant hand at least 75% of the time for ADLs/IADLs..    Time  12    Period  Weeks    Status  New      OT LONG TERM GOAL #5   Title  Pt will demonstrate ability to write his name and address legibily using RUE.    Time  12    Period  Weeks    Status  New      Long Term Additional Goals   Additional Long Term Goals  Yes      OT LONG TERM GOAL #6   Title  Pt will perform simulated work activities modified indpendently demonstrating good safety awareness.    Time  12    Period  Weeks    Status  New            Plan - 06/23/18 1110    Clinical Impression Statement  Pt progressing with RUE function and decreased pain w/ proper positioning    Occupational performance deficits (Please refer to evaluation for details):  ADL's;IADL's;Work;Leisure;Social Participation    Body Structure / Function / Physical Skills  ADL;Dexterity;Flexibility;ROM;Strength;IADL;FMC;Coordination;Balance;UE functional use;GMC;Gait;Endurance;Decreased knowledge of precautions;Sensation    Rehab Potential  Good    Comorbidities Affecting Occupational Performance:  May have comorbidities impacting occupational performance    OT Frequency  3x /  week    OT Duration  4 weeks   followed by 2x/wk for 4 weeks   OT Treatment/Interventions  Self-care/ADL training;Cryotherapy;Paraffin;Therapeutic exercise;DME and/or AE instruction;Functional Mobility Training;Balance training;Splinting;Manual Therapy;Neuromuscular education;Fluidtherapy;Ultrasound;Aquatic Therapy;Electrical Stimulation;Moist Heat;Contrast Bath;Energy conservation;Passive  range of motion;Therapeutic activities;Patient/family education    Plan  NMR RUE/trunk, check HEP, closed chain to open chain reach     Consulted and Agree with Plan of Care  Patient;Family member/caregiver    Family Member Consulted  dtr who interprets for him       Patient will benefit from skilled therapeutic intervention in order to improve the following deficits and impairments:  Body Structure / Function / Physical Skills  Visit Diagnosis: Hemiplegia and hemiparesis following cerebral infarction affecting right dominant side (HCC)  Muscle weakness (generalized)    Problem List Patient Active Problem List   Diagnosis Date Noted  . Left pontine cerebrovascular accident ([place]) 05/11/2018  . Tobacco abuse   . Uncontrolled type 2 diabetes mellitus with hyperglycemia ([place])   . CVA (cerebral vascular accident) ([place]) 05/06/2018  . Hyperlipidemia 05/30/2017    Carey Bullocks, OTR/L 06/23/2018, 11:12 AM  [facility] [address] [place], [place], [address] Phone: [phone]   Fax:  [phone]  Name: REYNOLD MANTELL MRN: [id number] Date of Birth: Dec 03, 1965

## 2018-06-24 ENCOUNTER — Telehealth: Payer: Self-pay

## 2018-06-24 NOTE — Telephone Encounter (Signed)
Copied from [place] [id number]. Topic: Quick Communication - Rx Refill/Question >> Jun 24, 2018 12:13 PM [place], [place] D wrote: Medication: metFORMIN (GLUCOPHAGE) 500 MG tablet / glipiZIDE (GLUCOTROL) 10 MG tablet / Pt's daughter Meliton Rattan stated pt would like to switch from these two rx's to [facility]. He would like to be able to take 1 tablet per day instead of 4. Please advise. [phone]   Has the patient contacted their pharmacy? No (Agent: If no, request that the patient contact the pharmacy for the refill.) (Agent: If yes, when and what did the pharmacy advise?)  Preferred Pharmacy (with phone number or street name): [facility], [place] - [address]. [phone] (Phone) [phone] (Fax)    Agent: Please be advised that RX refills may take up to 3 business days. We ask that you follow-up with your pharmacy.

## 2018-06-25 ENCOUNTER — Ambulatory Visit: Payer: Commercial Indemnity | Admitting: Occupational Therapy

## 2018-06-25 ENCOUNTER — Other Ambulatory Visit: Payer: Self-pay

## 2018-06-25 ENCOUNTER — Encounter: Payer: Self-pay | Admitting: Occupational Therapy

## 2018-06-25 ENCOUNTER — Encounter: Payer: Self-pay | Admitting: Physical Therapy

## 2018-06-25 ENCOUNTER — Ambulatory Visit: Payer: Self-pay | Admitting: Physical Therapy

## 2018-06-25 ENCOUNTER — Ambulatory Visit: Payer: Commercial Indemnity | Admitting: Physical Therapy

## 2018-06-25 DIAGNOSIS — I69351 Hemiplegia and hemiparesis following cerebral infarction affecting right dominant side: Secondary | ICD-10-CM

## 2018-06-25 DIAGNOSIS — R2689 Other abnormalities of gait and mobility: Secondary | ICD-10-CM

## 2018-06-25 DIAGNOSIS — R2681 Unsteadiness on feet: Secondary | ICD-10-CM

## 2018-06-25 DIAGNOSIS — R278 Other lack of coordination: Secondary | ICD-10-CM

## 2018-06-25 DIAGNOSIS — I6350 Cerebral infarction due to unspecified occlusion or stenosis of unspecified cerebral artery: Secondary | ICD-10-CM | POA: Diagnosis not present

## 2018-06-25 DIAGNOSIS — R293 Abnormal posture: Secondary | ICD-10-CM

## 2018-06-25 DIAGNOSIS — M6281 Muscle weakness (generalized): Secondary | ICD-10-CM

## 2018-06-25 NOTE — Therapy (Signed)
[facility] [address] [place], [place], [address] Phone: [phone]   Fax:  [phone]  Occupational Therapy Treatment  Patient Details  Name: Arthur Howard MRN: [id number] Date of Birth: 04-09-1965 No data recorded  Encounter Date: 06/25/2018  OT End of Session - 06/25/18 1107    Visit Number  8    Number of Visits  33    Date for OT Re-Evaluation  08/25/18    Authorization Type  [employer]    Authorization Time Period  12 weeks, 60 visit limit, counts as 1 visit if PT/OT    Authorization - Visit Number  8    Authorization - Number of Visits  70    OT Start Time  1105    OT Stop Time  1145    OT Time Calculation (min)  40 min    Activity Tolerance  Patient tolerated treatment well    Behavior During Therapy  [facility] for tasks assessed/performed       Past Medical History:  Diagnosis Date  . Diabetes mellitus without complication ([place])   . Hyperlipidemia     Past Surgical History:  Procedure Laterality Date  . ANKLE SURGERY      There were no vitals filed for this visit.  Subjective Assessment - 06/25/18 1106    Subjective   no pain at end of session    Patient is accompanied by:  Family member   daughter interpreted   Pertinent History  CVA, DM, hyperlipidemia    Limitations  non English speaking    Patient Stated Goals  recover use of RUE    Currently in Pain?  Yes    Pain Score  3     Pain Location  Wrist    Pain Orientation  Right    Pain Descriptors / Indicators  Aching    Pain Type  Acute pain    Pain Onset  More than a month ago    Pain Frequency  Intermittent    Aggravating Factors   worse in am    Pain Relieving Factors  repositioning         Neuro re-ed:  In supine, closed-chain/AAROM shoulder flex  with min-mod facilitation for normal movement patterns.  In sitting, wt. Bearing though hand on mat with body on arm movements for decr tone, incr scapular stability, incr tricep  activation, min-mod cueing for normal movement patterns RUE/trunk.  Anterior/posterior pelvic tilts, lateral pelvic tilts with min facilitation/cues. Closed-chain shoulder flex with ball with min facilitation/cues.  Standing, rolling ball up wall for AAROM shoulder flex BUEs with min facilitation/cues.  Wall push-ups for incr scapular stability with min facilitation/cues.  In prone, scapular retraction with min-mod facilitation.    In modified quadruped,  forward/backward wt. Shifts as tolerated without wrist pain min facilitation/cueing for incr core/scapular depression, incr tricep activation   Sitting, mid-range open functional reach with min cueing for normal movement patterns.    OT Education - 06/25/18 1202    Education Details  performing functional tasks with RUE with proper positioning/avoiding shoulder hike    Person(s) Educated  Patient;Child(ren)    Methods  Explanation;Demonstration;Handout;Verbal cues;Tactile cues    Comprehension  Verbalized understanding;Returned demonstration;Verbal cues required       OT Short Term Goals - 06/25/18 1205      OT SHORT TERM GOAL #1   Title  I with inital HEP.    Time  6    Period  Weeks  Status  On-going    Target Date  07/11/18      OT SHORT TERM GOAL #2   Title  Pt will demonstrate  improved RUE functional use as evidenced by increasing box/ blocks score by 8 blocks.    Baseline  RUE 48, LUE 75    Time  6    Period  Weeks    Status  On-going      OT SHORT TERM GOAL #3   Title  Pt will demonstrate ability to retrieve a light weight object from overhead shelf at 120 shoulder flexion with no more than min compensations.    Time  6    Period  Weeks    Status  On-going      OT SHORT TERM GOAL #4   Title  Pt will perfrom all basic ADLS modified independently.    Time  6    Period  Weeks    Status  On-going      OT SHORT TERM GOAL #5   Title  Pt will increase RUE grip strength to 35 lbs of greater for increased functional  use.    Time  6    Period  Weeks    Status  On-going      OT SHORT TERM GOAL #6   Title  Pt will report using his RUE to assist with ADLs at least 50% of the time or greater.    Time  12    Period  Weeks    Status  On-going        OT Long Term Goals - 05/27/18 1326      OT LONG TERM GOAL #1   Title  Pt will demonstrate ability to retrieve a 3 lbs weight from overhead shelf demonstrating good control with RUE.    Time  12    Period  Weeks    Status  New    Target Date  08/25/18      OT LONG TERM GOAL #2   Title  Pt will perform mod complex home managment modified independently.    Time  12    Period  Weeks    Status  New      OT LONG TERM GOAL #3   Title  Pt will demonstrate improved RUE fine motor coordination for ADLS as evidenced by completing 9 hole peg test in 38 secs or less.    Time  12    Period  Weeks    Status  New      OT LONG TERM GOAL #4   Title  Pt will report that he has returned to using his RUE as dominant hand at least 75% of the time for ADLs/IADLs..    Time  12    Period  Weeks    Status  New      OT LONG TERM GOAL #5   Title  Pt will demonstrate ability to write his name and address legibily using RUE.    Time  12    Period  Weeks    Status  New      Long Term Additional Goals   Additional Long Term Goals  Yes      OT LONG TERM GOAL #6   Title  Pt will perform simulated work activities modified indpendently demonstrating good safety awareness.    Time  12    Period  Weeks    Status  New  Plan - 06/25/18 1205    Clinical Impression Statement  Pt progressing with RUE function and decreased pain w/ proper positioning    Occupational performance deficits (Please refer to evaluation for details):  ADL's;IADL's;Work;Leisure;Social Participation    Body Structure / Function / Physical Skills  ADL;Dexterity;Flexibility;ROM;Strength;IADL;FMC;Coordination;Balance;UE functional use;GMC;Gait;Endurance;Decreased knowledge of  precautions;Sensation    Rehab Potential  Good    Comorbidities Affecting Occupational Performance:  May have comorbidities impacting occupational performance    OT Frequency  3x / week    OT Duration  4 weeks   followed by 2x/wk for 4 weeks   OT Treatment/Interventions  Self-care/ADL training;Cryotherapy;Paraffin;Therapeutic exercise;DME and/or AE instruction;Functional Mobility Training;Balance training;Splinting;Manual Therapy;Neuromuscular education;Fluidtherapy;Ultrasound;Aquatic Therapy;Electrical Stimulation;Moist Heat;Contrast Bath;Energy conservation;Passive range of motion;Therapeutic activities;Patient/family education    Plan  progress/add to HEP; NMR RUE/trunk,  closed chain to open chain reach     Consulted and Agree with Plan of Care  Patient;Family member/caregiver    Family Member Consulted  dtr who interprets for him       Patient will benefit from skilled therapeutic intervention in order to improve the following deficits and impairments:  Body Structure / Function / Physical Skills  Visit Diagnosis: Hemiplegia and hemiparesis following cerebral infarction affecting right dominant side (HCC)  Muscle weakness (generalized)  Unsteadiness on feet  Other abnormalities of gait and mobility  Other lack of coordination  Abnormal posture    Problem List Patient Active Problem List   Diagnosis Date Noted  . Left pontine cerebrovascular accident ([place]) 05/11/2018  . Tobacco abuse   . Uncontrolled type 2 diabetes mellitus with hyperglycemia ([place])   . CVA (cerebral vascular accident) ([place]) 05/06/2018  . Hyperlipidemia 05/30/2017    [facility] 06/25/2018, 12:06 PM  [facility] [address] [place], [place], [address] Phone: [phone]   Fax:  [phone]  Name: KENTREL CLEVENGER MRN: [id number] Date of Birth: 01/07/1966   Vianne Bulls, OTR/L [facility] [address]  [place], [place]  [address] [phone] phone [phone] 06/25/18 12:06 PM

## 2018-06-25 NOTE — Patient Instructions (Addendum)
Access Code: [email or web address]  URL: [email or web address]  Date: 06/25/2018  Prepared by: Mady Haagensen   Exercises  Marching Bridge - 10-20 reps - 1 sets - - hold - 1x daily - 5x weekly  Sit to/from Stand in Stride position - 15-20 reps - 1 sets - 1x daily - 5x weekly  Stride Stance Weight Shift - 20 reps - 1 sets - - hold - 3x daily - 7x weekly  Tandem Walking with Counter Support - 3 reps - 1 sets - 1x daily - 5x weekly  Toe Walking with Counter Support - 3 reps - 1 sets - 1x daily - 5x weekly  Heel Raise - 10 reps - 1 sets - - hold - 1x daily - 5x weekly  Foam-feet together, EC, head turns - 10 reps - 1 sets - - hold - 1x daily - 5x weekly   Additions 06/25/2018: Seated Hamstring Stretch - 3 reps - 1 sets - 30 hold - 2-3x daily - 7x weekly  Seated Gastroc Stretch with Strap - 3 reps - 1 sets - 30 sec hold - 2-3x daily - 7x weekly

## 2018-06-25 NOTE — Therapy (Signed)
[facility] [address] [place], [place], [address] Phone: [phone]   Fax:  [phone]  Physical Therapy Treatment  Patient Details  Name: Arthur Howard MRN: [id number] Date of Birth: 11-08-1965 Referring Provider (PT): Angiulli, Chapman Fitch (from hospital-f/u to be with Sherald Barge, NP)   Encounter Date: 06/25/2018  PT End of Session - 06/25/18 1329    Visit Number  8    Number of Visits  22    Date for PT Re-Evaluation  08/25/18    Authorization Type  [employer]-60 visits combined PT and OT if on same day    Authorization - Visit Number  8    Authorization - Number of Visits  60    PT Start Time  4481    PT Stop Time  1058    PT Time Calculation (min)  44 min    Equipment Utilized During Treatment  Gait belt    Activity Tolerance  Patient tolerated treatment well    Behavior During Therapy  [facility] for tasks assessed/performed       Past Medical History:  Diagnosis Date  . Diabetes mellitus without complication ([place])   . Hyperlipidemia     Past Surgical History:  Procedure Laterality Date  . ANKLE SURGERY      There were no vitals filed for this visit.  Subjective Assessment - 06/25/18 1015    Subjective  Wants to make sure that his R hand gets stronger.  Feels his right leg is getting stronger.    Patient is accompained by:  Family member    Patient Stated Goals  Pt's goal is to improve R hand, arm, leg strength.    Currently in Pain?  No/denies   pain in shoulder and leg limited sleep last night; no pain at tx session   Pain Onset  More than a month ago                       [facility] Adult PT Treatment/Exercise - 06/25/18 0001      Transfers   Transfers  Sit to Stand;Stand to Sit    Sit to Stand  6: Modified independent (Device/Increase time);Without upper extremity assist;From bed   stride stance, RLE posterior   Stand to Sit  6: Modified independent (Device/Increase  time);Without upper extremity assist;To bed    Number of Reps  10 reps;2 sets   2nd set standing on Airex mat     Ambulation/Gait   Ambulation/Gait  Yes    Ambulation/Gait Assistance  5: Supervision    Ambulation/Gait Assistance Details  cues for increased step length, increased R heel strike and push off, AFO removed during session.  Tactile cues provided for relaxed shoulders to encourage trunk rotation, arm swing.    Ambulation Distance (Feet)  115 Feet   x 3, then 115 ft x 2   Assistive device  None    Gait Pattern  Step-through pattern;Decreased arm swing - right;Decreased step length - right;Decreased dorsiflexion - right    Ambulation Surface  Level;Indoor      Knee/Hip Exercises: Stretches   Active Hamstring Stretch  Right;3 reps;30 seconds    Active Hamstring Stretch Limitations  foot propped on 4" block      Ankle Exercises: Stretches   Gastroc Stretch  3 reps;30 seconds   using gait belt         Balance Exercises - 06/25/18 1045      Balance Exercises: Standing  Rockerboard  Anterior/posterior;EO;Other reps (comment)   2 sets 10 reps, cues to maintain hip extension   Tandem Gait  Forward;Retro;3 reps   Cues for abdominal activation; tandem march fwd x 3   Sidestepping  3 reps   R and L in parallel bars, then sidestep squats x 3   Marching Limitations  Forward/back marching in parallel bars 3 reps    Heel Raises Limitations  2 sets x 10    Other Standing Exercises  SLS activity with forward step tap to 6" step, then back step (runner's stretch position) x 10 reps each leg, cues for control in transition with stance leg knee extension to flexion.      Standing on Airex in parallel bars (no UE support):  Marching in place, 2 sets x 10 reps, then forward step taps alternating legs, x 10 reps; on rockerboard, forward step taps to touch floor x 10 reps each leg.  PT Education - 06/25/18 1328    Education Details  Additions to HEP, including hamstring and gastroc  stretch    Person(s) Educated  Patient   family member   Methods  Explanation;Demonstration;Verbal cues;Handout    Comprehension  Verbalized understanding;Returned demonstration;Verbal cues required       PT Short Term Goals - 06/23/18 1106      PT SHORT TERM GOAL #1   Title  Pt will be independent with HEP for improved transfers, balance, gait and strength.  TARGET 06/25/2018    Baseline  06/23/18: met with current program    Status  Achieved    Target Date  06/25/18      PT SHORT TERM GOAL #2   Title  Pt will improve 5x sit<>stand to less than or equal to 12.5 seconds for improved transfer efficiency and lower extremity strength.    Baseline  06/23/18: 9.90 sec's no UE support from standard height surface    Time  --    Period  --    Status  Achieved    Target Date  06/25/18      PT SHORT TERM GOAL #3   Title  Pt will improve TUG score to less than or equal to 13.5 sec for decreased fall risk.    Baseline  06/23/18: 7.13 sec's no device except for right AFO    Time  --    Period  --    Status  Achieved    Target Date  06/25/18      PT SHORT TERM GOAL #4   Title  Pt will improve DGI score to at least 17/24 for decreased fall risk.    Baseline  06/23/18: 19/24 scored today    Time  --    Period  --    Status  Achieved    Target Date  06/25/18      PT SHORT TERM GOAL #5   Title  Pt will negotiate 4 steps with alternating pattern, 1 rail modified independently, for preparation for return to work activities.    Baseline  06/23/18: met today    Time  --    Period  --    Status  Achieved    Target Date  06/25/18      PT SHORT TERM GOAL #6   Title  Pt will verbalize understanding of fall prevention in home environment.    Baseline  06/23/18: met today    Time  --    Period  --    Status  Achieved    Target  Date  06/25/18        PT Long Term Goals - 05/27/18 1221      PT LONG TERM GOAL #1   Title  Pt will be independent with progressive HEP for strength, balance, and gait.   TARGET 07/23/2018    Time  9    Period  Weeks    Status  New    Target Date  07/23/18      PT LONG TERM GOAL #2   Title  Pt will perform 8 of 10 reps from surfaces <18" with no UE support, for improved transfer efficeincy and improved lower extremity strength.    Time  9    Period  Weeks    Status  New    Target Date  07/23/18      PT LONG TERM GOAL #3   Title  Pt will improve DGI score to at least 19/24 for decreased fall risk.    Time  9    Period  Weeks    Status  New    Target Date  07/23/18      PT LONG TERM GOAL #4   Title  Pt will ambulate at least 1000 ft, independently, no LOB, indoor and outdoor surfaces, for improved community gait.    Time  9    Period  Weeks    Status  New    Target Date  07/23/18      PT LONG TERM GOAL #5   Title  Pt will perform floor>stand transfer independently, for safe fall recovery/floor activities/return to work.    Time  9    Period  Weeks    Status  New    Target Date  07/23/18            Plan - 06/25/18 1331    Clinical Impression Statement  Pt able to perform most of balance activities with no UE support, needs light UE for SLS activity at steps.  Pt does appear to have increased trunk sway with higher level balance and responds well to cues for abdominal activation for decreased trunk sway.  Pt needs occasional reminders for increased RLE step length, heelstrike for improved foot clearance with gait.  Pt will continue to benefit from continued PT for further strength, balance, and gait training.    Comorbidities  hyperlipidemia, DM    Rehab Potential  Good    PT Frequency  Other (comment)    PT Duration  Other (comment)    PT Treatment/Interventions  ADLs/Self Care Home Management;Electrical Stimulation;DME Instruction;Gait training;Stair training;Functional mobility training;Therapeutic activities;Therapeutic exercise;Orthotic Fit/Training;Patient/family education;Neuromuscular re-education;Balance training;Manual techniques     PT Next Visit Plan  cont RLE strengthening - Rt gastrocs/knee control, SLS activities, high level balance and compliant surface work; gait training without AFO    PT Home Exercise Plan  Access Code: [email or web address] , plus gastroc stretch    Consulted and Agree with Plan of Care  Patient;Family member/caregiver    Family Member Consulted  Daughter       Patient will benefit from skilled therapeutic intervention in order to improve the following deficits and impairments:  Abnormal gait, Decreased balance, Decreased mobility, Difficulty walking, Decreased strength, Impaired flexibility, Impaired tone, Postural dysfunction  Visit Diagnosis: Muscle weakness (generalized)  Other abnormalities of gait and mobility  Unsteadiness on feet     Problem List Patient Active Problem List   Diagnosis Date Noted  . Left pontine cerebrovascular accident ([place]) 05/11/2018  . Tobacco abuse   . Uncontrolled  type 2 diabetes mellitus with hyperglycemia ([place])   . CVA (cerebral vascular accident) ([place]) 05/06/2018  . Hyperlipidemia 05/30/2017    Daiveon Markman W. 06/25/2018, 1:36 PM  Frazier Butt., PT   [facility] [address] [place], [place], [address] Phone: [phone]   Fax:  [phone]  Name: Arthur Howard MRN: [id number] Date of Birth: 1965-10-16

## 2018-06-28 ENCOUNTER — Ambulatory Visit: Payer: Self-pay | Admitting: Physical Therapy

## 2018-06-29 ENCOUNTER — Ambulatory Visit: Payer: Commercial Indemnity | Admitting: Occupational Therapy

## 2018-06-29 ENCOUNTER — Encounter: Payer: Self-pay | Admitting: Occupational Therapy

## 2018-06-29 ENCOUNTER — Ambulatory Visit: Payer: Commercial Indemnity | Admitting: Physical Therapy

## 2018-06-29 ENCOUNTER — Ambulatory Visit: Payer: Self-pay | Admitting: Physical Therapy

## 2018-06-29 ENCOUNTER — Inpatient Hospital Stay: Payer: Commercial Indemnity | Admitting: Adult Health

## 2018-06-29 ENCOUNTER — Telehealth: Payer: Self-pay | Admitting: *Deleted

## 2018-06-29 ENCOUNTER — Encounter: Payer: Self-pay | Admitting: Physical Therapy

## 2018-06-29 ENCOUNTER — Other Ambulatory Visit: Payer: Self-pay

## 2018-06-29 DIAGNOSIS — R2681 Unsteadiness on feet: Secondary | ICD-10-CM

## 2018-06-29 DIAGNOSIS — I69351 Hemiplegia and hemiparesis following cerebral infarction affecting right dominant side: Secondary | ICD-10-CM

## 2018-06-29 DIAGNOSIS — M6281 Muscle weakness (generalized): Secondary | ICD-10-CM

## 2018-06-29 DIAGNOSIS — I6350 Cerebral infarction due to unspecified occlusion or stenosis of unspecified cerebral artery: Secondary | ICD-10-CM | POA: Diagnosis not present

## 2018-06-29 DIAGNOSIS — R2689 Other abnormalities of gait and mobility: Secondary | ICD-10-CM

## 2018-06-29 DIAGNOSIS — R293 Abnormal posture: Secondary | ICD-10-CM

## 2018-06-29 DIAGNOSIS — R278 Other lack of coordination: Secondary | ICD-10-CM

## 2018-06-29 NOTE — Patient Instructions (Signed)
  Coordination Activities  Perform the following activities for 15 minutes 1-2 times per day with right hand(s).  Sit up tall.   Rotate ball in fingertips (clockwise and counter-clockwise).  Flip cards 1 at a time by turning palm up.  Pick up coins and place in container or coin bank.  Make sure you keep the elbow down.

## 2018-06-29 NOTE — Telephone Encounter (Signed)
May call in neurontin 100mg  TID #90 no RF

## 2018-06-29 NOTE — Telephone Encounter (Signed)
Mr Livesey daughter called to verify his appt 07/05/18  (may be changed due to COVID 19) and reports that he is having pain in right shoulder down to his fingers.  Please advise.

## 2018-06-29 NOTE — Therapy (Signed)
[facility] [address] [place], [place], [address] Phone: [phone]   Fax:  [phone]  Physical Therapy Treatment  Patient Details  Name: Arthur Howard MRN: [id number] Date of Birth: 1966-03-04 Referring Provider (PT): Angiulli, Chapman Fitch (from hospital-f/u to be with Sherald Barge, NP)   Encounter Date: 06/29/2018  PT End of Session - 06/29/18 1502    Visit Number  9    Number of Visits  22    Date for PT Re-Evaluation  08/25/18    Authorization Type  [employer]-60 visits combined PT and OT if on same day    Authorization - Visit Number  9    Authorization - Number of Visits  60    PT Start Time  1109    PT Stop Time  1148    PT Time Calculation (min)  39 min    Activity Tolerance  Patient tolerated treatment well    Behavior During Therapy  [facility] for tasks assessed/performed       Past Medical History:  Diagnosis Date  . Diabetes mellitus without complication ([place])   . Hyperlipidemia     Past Surgical History:  Procedure Laterality Date  . ANKLE SURGERY      There were no vitals filed for this visit.  Subjective Assessment - 06/29/18 1111    Subjective  Doing okay, except pain in hand and fingers.    Patient is accompained by:  Family member    Patient Stated Goals  Pt's goal is to improve R hand, arm, leg strength.    Currently in Pain?  Yes    Pain Score  4     Pain Location  Hand    Pain Orientation  Right    Pain Descriptors / Indicators  Aching;Pressure;Dull    Pain Onset  More than a month ago    Pain Frequency  Intermittent    Aggravating Factors   nothing    Pain Relieving Factors  nothing                       [facility] Adult PT Treatment/Exercise - 06/29/18 0001      Ambulation/Gait   Ambulation/Gait  Yes    Ambulation/Gait Assistance  5: Supervision    Ambulation/Gait Assistance Details  VCs given for increased RLE stance time, tactile cues for increased trunk  rotation    Ambulation Distance (Feet)  115 Feet   100 ft indoors, then 300 ft outdoors   Assistive device  None   Pt not wearing AFO   Gait Pattern  Step-through pattern;Decreased arm swing - right;Decreased step length - right;Decreased dorsiflexion - right    Ambulation Surface  Level;Indoor;Unlevel;Outdoor    Gait Comments  Cues for proper technique of negotiating incline and decline, regarding posture, foot clearance and heelstrike      Knee/Hip Exercises: Stretches   Active Hamstring Stretch  Right;3 reps;30 seconds    Active Hamstring Stretch Limitations  foot propped on 4" block-reviewed from HEP, needing cues for longer hold time      Knee/Hip Exercises: Standing   Heel Raises  Both;1 set;10 reps    Heel Raises Limitations  Then heel/toe raises x 10, then single limb heel raises x 10    Wall Squat  1 set;10 reps    Other Standing Knee Exercises  Squat walking in parallel bars 10 ft x 3 reps, for ankle dorsiflexion, hamstring activation      Ankle Exercises: Stretches  Gastroc Stretch  3 reps;30 seconds   Using gait belt, reviewed as part of HEP         Balance Exercises - 06/29/18 1454      Balance Exercises: Standing   Marching Limitations  Forward/back marching, then forward marching 3 reps 10 ft, with minimal UE support, visual cues from mirror for upright posture    Other Standing Exercises  SLS activity with forward step tap to 6" step, then back step (runner's stretch position) x 10 reps RLE, cues for control in transition with stance leg knee extension to flexion.  SLS RLE with LLE step taps to 6", 12", 6" step without UE support, cues for upright posture.  Sidestep squats 10 ft x 2 reps, with cues for equal weightshifting; forward lunge stepping 10 ft x 4 reps        PT Education - 06/29/18 1501    Education Details  Increase weightshifting to RLE for increased stance time with gait (to avoid LLE fatigueing with gait, as pt reported)    Person(s) Educated   Patient;Other (comment)   daughter   Methods  Explanation;Demonstration    Comprehension  Verbalized understanding;Returned demonstration       PT Short Term Goals - 06/23/18 1106      PT SHORT TERM GOAL #1   Title  Pt will be independent with HEP for improved transfers, balance, gait and strength.  TARGET 06/25/2018    Baseline  06/23/18: met with current program    Status  Achieved    Target Date  06/25/18      PT SHORT TERM GOAL #2   Title  Pt will improve 5x sit<>stand to less than or equal to 12.5 seconds for improved transfer efficiency and lower extremity strength.    Baseline  06/23/18: 9.90 sec's no UE support from standard height surface    Time  --    Period  --    Status  Achieved    Target Date  06/25/18      PT SHORT TERM GOAL #3   Title  Pt will improve TUG score to less than or equal to 13.5 sec for decreased fall risk.    Baseline  06/23/18: 7.13 sec's no device except for right AFO    Time  --    Period  --    Status  Achieved    Target Date  06/25/18      PT SHORT TERM GOAL #4   Title  Pt will improve DGI score to at least 17/24 for decreased fall risk.    Baseline  06/23/18: 19/24 scored today    Time  --    Period  --    Status  Achieved    Target Date  06/25/18      PT SHORT TERM GOAL #5   Title  Pt will negotiate 4 steps with alternating pattern, 1 rail modified independently, for preparation for return to work activities.    Baseline  06/23/18: met today    Time  --    Period  --    Status  Achieved    Target Date  06/25/18      PT SHORT TERM GOAL #6   Title  Pt will verbalize understanding of fall prevention in home environment.    Baseline  06/23/18: met today    Time  --    Period  --    Status  Achieved    Target Date  06/25/18  PT Long Term Goals - 05/27/18 1221      PT LONG TERM GOAL #1   Title  Pt will be independent with progressive HEP for strength, balance, and gait.  TARGET 07/23/2018    Time  9    Period  Weeks    Status  New     Target Date  07/23/18      PT LONG TERM GOAL #2   Title  Pt will perform 8 of 10 reps from surfaces <18" with no UE support, for improved transfer efficeincy and improved lower extremity strength.    Time  9    Period  Weeks    Status  New    Target Date  07/23/18      PT LONG TERM GOAL #3   Title  Pt will improve DGI score to at least 19/24 for decreased fall risk.    Time  9    Period  Weeks    Status  New    Target Date  07/23/18      PT LONG TERM GOAL #4   Title  Pt will ambulate at least 1000 ft, independently, no LOB, indoor and outdoor surfaces, for improved community gait.    Time  9    Period  Weeks    Status  New    Target Date  07/23/18      PT LONG TERM GOAL #5   Title  Pt will perform floor>stand transfer independently, for safe fall recovery/floor activities/return to work.    Time  9    Period  Weeks    Status  New    Target Date  07/23/18            Plan - 06/29/18 1503    Clinical Impression Statement  Continued to work on SLS activities this visit, with cues for control of R knee.  Pt did not wear AFO into therapy session, and he reports not wearing AFO except for long distance walking outdoors.  Gait training activities today indoors and outdoors, with VCs and tactile cues for increased RLE weightshift, stance time, as pt reports he often feels LLE being fatigued.  With cues, pt able to improve gait pattern.  Pt will continue to benefit from skilled PT to further address balance, gait and strengthening.    Comorbidities  hyperlipidemia, DM    Rehab Potential  Good    PT Frequency  Other (comment)    PT Duration  Other (comment)    PT Treatment/Interventions  ADLs/Self Care Home Management;Electrical Stimulation;DME Instruction;Gait training;Stair training;Functional mobility training;Therapeutic activities;Therapeutic exercise;Orthotic Fit/Training;Patient/family education;Neuromuscular re-education;Balance training;Manual techniques    PT Next Visit  Plan  Update HEP for higher level strength, balance exercises; cont RLE strengthening - Rt gastrocs/knee control, SLS activities, high level balance and compliant surface work; 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    PT Home Exercise Plan  Access Code: [email or web address] , plus gastroc stretch    Consulted and Agree with Plan of Care  Patient;Family member/caregiver    Family Member Consulted  Daughter       Patient will benefit from skilled therapeutic intervention in order to improve the following deficits and impairments:  Abnormal gait, Decreased balance, Decreased mobility, Difficulty walking, Decreased strength, Impaired flexibility, Impaired tone, Postural dysfunction  Visit Diagnosis: Muscle weakness (generalized)  Other abnormalities of gait and mobility  Unsteadiness on feet     Problem List Patient Active Problem List   Diagnosis Date Noted  . Left pontine cerebrovascular accident ([place])  05/11/2018  . Tobacco abuse   . Uncontrolled type 2 diabetes mellitus with hyperglycemia ([place])   . CVA (cerebral vascular accident) ([place]) 05/06/2018  . Hyperlipidemia 05/30/2017    Zhoe Catania W. 06/29/2018, 3:08 PM  Frazier Butt., PT  [facility] [address] [place], [place], [address] Phone: [phone]   Fax:  [phone]  Name: Arthur Howard MRN: [id number] Date of Birth: Jun 06, 1965

## 2018-06-29 NOTE — Therapy (Signed)
[facility] [address] [place], [place], [address] Phone: [phone]   Fax:  [phone]  Occupational Therapy Treatment  Patient Details  Name: Arthur Howard MRN: [id number] Date of Birth: 02-02-1966 No data recorded  Encounter Date: 06/29/2018  OT End of Session - 06/29/18 1025    Visit Number  9    Number of Visits  33    Date for OT Re-Evaluation  08/25/18    Authorization Type  [employer]    Authorization Time Period  12 weeks, 60 visit limit, counts as 1 visit if PT/OT    Authorization - Visit Number  9    Authorization - Number of Visits  60    OT Start Time  1021    OT Stop Time  1103    OT Time Calculation (min)  42 min    Activity Tolerance  Patient tolerated treatment well    Behavior During Therapy  WFL for tasks assessed/performed       Past Medical History:  Diagnosis Date  . Diabetes mellitus without complication ([place])   . Hyperlipidemia     Past Surgical History:  Procedure Laterality Date  . ANKLE SURGERY      There were no vitals filed for this visit.  Subjective Assessment - 06/29/18 1023    Subjective   severe pain in hand last night, better today.  hand is tight in the morning     Patient is accompanied by:  Family member   daughter interpreted   Pertinent History  CVA, DM, hyperlipidemia    Limitations  non English speaking    Patient Stated Goals  recover use of RUE    Currently in Pain?  Yes    Pain Score  4     Pain Location  Hand    Pain Orientation  Right    Pain Descriptors / Indicators  Aching;Pressure;Dull    Pain Onset  More than a month ago    Pain Frequency  Intermittent    Aggravating Factors   nothing    Pain Relieving Factors  nothing          Neuro re-ed:  In supine, closed-chain/AAROM shoulder flex, chest press, abduction, tricep ext with ball  with min facilitation/cues for normal movement patterns.  Sitting, Anterior/posterior pelvic tilts with light  wt. Bearing through elbows on table, Closed-chain shoulder flex with ball with min facilitation/cues.  Standing, rolling ball up wall for AAROM shoulder flex BUEs with min facilitation/cues.  Tall kneeling, rolling ball forward/back for AAROM shoulder flex with min facilitation/cueing for scapular mobility.   In prone, scapular retraction with min-mod facilitation/cues.    Gentle wrist joint mobs In modified quadruped,  forward/backward wt. Shifts as tolerated without wrist pain min facilitation/cueing for incr scapular depression, incr tricep activation       OT Education - 06/29/18 1456    Education Details  Coordination HEP--see pt instructions (emphasis on proper shoulder positioning).  Recommended running warm water over hand in am to help with stiffness/pain.      Person(s) Educated  Patient;Child(ren)    Methods  Explanation;Demonstration;Handout;Verbal cues;Tactile cues    Comprehension  Verbalized understanding;Returned demonstration;Verbal cues required       OT Short Term Goals - 06/25/18 1205      OT SHORT TERM GOAL #1   Title  I with inital HEP.    Time  6    Period  Weeks    Status  On-going  Target Date  07/11/18      OT SHORT TERM GOAL #2   Title  Pt will demonstrate  improved RUE functional use as evidenced by increasing box/ blocks score by 8 blocks.    Baseline  RUE 48, LUE 75    Time  6    Period  Weeks    Status  On-going      OT SHORT TERM GOAL #3   Title  Pt will demonstrate ability to retrieve a light weight object from overhead shelf at 120 shoulder flexion with no more than min compensations.    Time  6    Period  Weeks    Status  On-going      OT SHORT TERM GOAL #4   Title  Pt will perfrom all basic ADLS modified independently.    Time  6    Period  Weeks    Status  On-going      OT SHORT TERM GOAL #5   Title  Pt will increase RUE grip strength to 35 lbs of greater for increased functional use.    Time  6    Period  Weeks    Status   On-going      OT SHORT TERM GOAL #6   Title  Pt will report using his RUE to assist with ADLs at least 50% of the time or greater.    Time  12    Period  Weeks    Status  On-going        OT Long Term Goals - 05/27/18 1326      OT LONG TERM GOAL #1   Title  Pt will demonstrate ability to retrieve a 3 lbs weight from overhead shelf demonstrating good control with RUE.    Time  12    Period  Weeks    Status  New    Target Date  08/25/18      OT LONG TERM GOAL #2   Title  Pt will perform mod complex home managment modified independently.    Time  12    Period  Weeks    Status  New      OT LONG TERM GOAL #3   Title  Pt will demonstrate improved RUE fine motor coordination for ADLS as evidenced by completing 9 hole peg test in 38 secs or less.    Time  12    Period  Weeks    Status  New      OT LONG TERM GOAL #4   Title  Pt will report that he has returned to using his RUE as dominant hand at least 75% of the time for ADLs/IADLs..    Time  12    Period  Weeks    Status  New      OT LONG TERM GOAL #5   Title  Pt will demonstrate ability to write his name and address legibily using RUE.    Time  12    Period  Weeks    Status  New      Long Term Additional Goals   Additional Long Term Goals  Yes      OT LONG TERM GOAL #6   Title  Pt will perform simulated work activities modified indpendently demonstrating good safety awareness.    Time  12    Period  Weeks    Status  New            Plan - 06/29/18 1452  Clinical Impression Statement  Pt progressing with RUE functional use, but continues to report pain in hand at night.  Pt's PCP appt was cancelled due to Covid-19.      Occupational performance deficits (Please refer to evaluation for details):  ADL's;IADL's;Work;Leisure;Social Participation    Body Structure / Function / Physical Skills  ADL;Dexterity;Flexibility;ROM;Strength;IADL;FMC;Coordination;Balance;UE functional use;GMC;Gait;Endurance;Decreased  knowledge of precautions;Sensation    Rehab Potential  Good    Comorbidities Affecting Occupational Performance:  May have comorbidities impacting occupational performance    OT Frequency  3x / week    OT Duration  4 weeks   followed by 2x/wk for 4 weeks   OT Treatment/Interventions  Self-care/ADL training;Cryotherapy;Paraffin;Therapeutic exercise;DME and/or AE instruction;Functional Mobility Training;Balance training;Splinting;Manual Therapy;Neuromuscular education;Fluidtherapy;Ultrasound;Aquatic Therapy;Electrical Stimulation;Moist Heat;Contrast Bath;Energy conservation;Passive range of motion;Therapeutic activities;Patient/family education    Plan  NMR RUE/trunk,  closed chain to open chain reach     Consulted and Agree with Plan of Care  Patient;Family member/caregiver    Family Member Consulted  dtr who interprets for him       Patient will benefit from skilled therapeutic intervention in order to improve the following deficits and impairments:  Body Structure / Function / Physical Skills  Visit Diagnosis: Hemiplegia and hemiparesis following cerebral infarction affecting right dominant side (HCC)  Muscle weakness (generalized)  Unsteadiness on feet  Other abnormalities of gait and mobility  Other lack of coordination  Abnormal posture    Problem List Patient Active Problem List   Diagnosis Date Noted  . Left pontine cerebrovascular accident ([place]) 05/11/2018  . Tobacco abuse   . Uncontrolled type 2 diabetes mellitus with hyperglycemia ([place])   . CVA (cerebral vascular accident) ([place]) 05/06/2018  . Hyperlipidemia 05/30/2017    [facility] 06/29/2018, 3:05 PM  [facility] [address] [place], [place], [address] Phone: [phone]   Fax:  [phone]  Name: Arthur Howard MRN: [id number] Date of Birth: 07/21/1965   Vianne Bulls, OTR/L [facility] [address] [place], [place]  [address] [phone] phone [phone] 06/29/18 3:05 PM

## 2018-06-30 ENCOUNTER — Other Ambulatory Visit: Payer: Self-pay | Admitting: *Deleted

## 2018-06-30 MED ORDER — GABAPENTIN 100 MG PO CAPS: 100.0000 mg | ORAL_CAPSULE | Freq: Three times a day (TID) | ORAL | 0 refills | Status: DC

## 2018-06-30 MED FILL — GABAPENTIN 100 MG CAPSULE: 100 | 90 days supply | Qty: 90 | Fill #0

## 2018-06-30 NOTE — Telephone Encounter (Signed)
Spoke with daughter,Patient notified

## 2018-07-01 ENCOUNTER — Encounter: Payer: Self-pay | Admitting: Occupational Therapy

## 2018-07-01 ENCOUNTER — Telehealth: Payer: Self-pay

## 2018-07-01 ENCOUNTER — Ambulatory Visit: Payer: Self-pay | Admitting: Physical Therapy

## 2018-07-01 NOTE — Telephone Encounter (Signed)
Daughter notified 

## 2018-07-01 NOTE — Telephone Encounter (Signed)
Swollen fingers are from stroke related weakness.  Needs to elevate the hand on a pillow and massage starting from fingers to the wrist 100 strokes per day

## 2018-07-01 NOTE — Telephone Encounter (Signed)
Patient daughter called stating patient is in pain, when is the medication given to him yesterday going to start working and patient woke up today with his fingers swollen, is that a side effect to his stroke? Informed daughter about medication Gabapentin. Wants to know about swollen fingers?

## 2018-07-02 ENCOUNTER — Ambulatory Visit: Payer: Commercial Indemnity | Admitting: Physical Therapy

## 2018-07-02 ENCOUNTER — Ambulatory Visit: Payer: Commercial Indemnity | Admitting: Occupational Therapy

## 2018-07-02 ENCOUNTER — Encounter: Payer: Self-pay | Admitting: Physical Therapy

## 2018-07-02 ENCOUNTER — Other Ambulatory Visit: Payer: Self-pay

## 2018-07-02 DIAGNOSIS — R2681 Unsteadiness on feet: Secondary | ICD-10-CM

## 2018-07-02 DIAGNOSIS — R2689 Other abnormalities of gait and mobility: Secondary | ICD-10-CM

## 2018-07-02 DIAGNOSIS — I6350 Cerebral infarction due to unspecified occlusion or stenosis of unspecified cerebral artery: Secondary | ICD-10-CM | POA: Diagnosis not present

## 2018-07-02 DIAGNOSIS — M6281 Muscle weakness (generalized): Secondary | ICD-10-CM

## 2018-07-02 DIAGNOSIS — I69351 Hemiplegia and hemiparesis following cerebral infarction affecting right dominant side: Secondary | ICD-10-CM

## 2018-07-02 DIAGNOSIS — R293 Abnormal posture: Secondary | ICD-10-CM

## 2018-07-02 DIAGNOSIS — R278 Other lack of coordination: Secondary | ICD-10-CM

## 2018-07-02 NOTE — Patient Instructions (Addendum)
Access Code: [email or web address]  URL: [email or web address]  Date: 07/02/2018  Prepared by: Mady Haagensen   Exercises  Sidestepping in Squat position 10 reps - 2 sets - 1x daily - 6x weekly  Forward Step Touch to 6" step, then reach back to runner's stretch position- 10 reps - 2 sets - 1x daily - 6x weekly  Walking Tandem Stance, walking tandem march - 10 reps - 2 sets - 1x daily - 6x weekly  Single Leg Stance on Foam Pad - 3 reps - 1 sets - 10 hold - 1x daily - 6x weekly  Braided Sidestepping - 10 reps - 2 sets - 1x daily - 6x weekly

## 2018-07-02 NOTE — Therapy (Signed)
[facility] [address] [place], [place], [address] Phone: [phone]   Fax:  [phone]  Physical Therapy Treatment  Patient Details  Name: Arthur Howard MRN: [id number] Date of Birth: 05-24-1965 Referring Provider (PT): Angiulli, Chapman Fitch (from hospital-f/u to be with Sherald Barge, NP)   Encounter Date: 07/02/2018  PT End of Session - 07/02/18 1205    Visit Number  10    Number of Visits  22    Date for PT Re-Evaluation  08/25/18    Authorization Type  [employer]-60 visits combined PT and OT if on same day    Authorization - Visit Number  10    Authorization - Number of Visits  60    PT Start Time  1018    PT Stop Time  1100    PT Time Calculation (min)  42 min    Activity Tolerance  Patient tolerated treatment well    Behavior During Therapy  [facility] for tasks assessed/performed       Past Medical History:  Diagnosis Date  . Diabetes mellitus without complication ([place])   . Hyperlipidemia     Past Surgical History:  Procedure Laterality Date  . ANKLE SURGERY      There were no vitals filed for this visit.  Subjective Assessment - 07/02/18 1021    Subjective  Did alot of walking yesterday, probably at least 20 minutes and the left leg is not too tired.  Started new medication (Gabapentin) earlier this week for the hand pain.    Patient is accompained by:  Family member    Patient Stated Goals  Pt's goal is to improve R hand, arm, leg strength.    Currently in Pain?  Yes    Pain Score  3     Pain Orientation  Right    Pain Descriptors / Indicators  Aching;Dull    Pain Type  Acute pain    Pain Onset  More than a month ago    Pain Frequency  Intermittent    Aggravating Factors   worse at night    Pain Relieving Factors  unsure (has started new medication-MD says it would take a week to see effects)                       [facility] Adult PT Treatment/Exercise - 07/02/18 0001      Knee/Hip Exercises: Aerobic   Stepper  SciFit Stepper, Level 1.5, lower extremities only x 8 minutes for leg strengthening          Balance Exercises - 07/02/18 1158      Balance Exercises: Standing   Standing Eyes Opened  Wide (BOA);Narrow base of support (BOS);Head turns;Foam/compliant surface;5 reps   Head nods   Standing Eyes Closed  Narrow base of support (BOS);Head turns;Foam/compliant surface;5 reps   Head nods   SLS  Eyes open;Solid surface;Foam/compliant surface;Upper extremity support 1;3 reps;10 secs    Tandem Gait  Forward;2 reps   15 ft   Partial Tandem Stance  Eyes open;Eyes closed;Foam/compliant surface;5 reps   Head turns and head nods   Sidestepping  2 reps   20 ft, side step squats   Marching Limitations  Forward marching 15 ft x 2, then progress to tandem march 15 ft x 2, cues for abdominal activation for steady trunk    Other Standing Exercises  SLS activity with forward step tap to 6" step, then back step (runner's stretch position) x 10  reps each leg, cues for control in transition with stance leg knee extension to flexion.  Forward lunge stepping 10 ft x 4 reps, cues for technique.  Braiding 20 ft, x 2 reps        PT Education - 07/02/18 1205    Education Details  Additions to [employer]) Educated  Patient;Child(ren)   daughter   Methods  Explanation;Demonstration;Handout    Comprehension  Verbalized understanding;Returned demonstration;Verbal cues required       PT Short Term Goals - 06/23/18 1106      PT SHORT TERM GOAL #1   Title  Pt will be independent with HEP for improved transfers, balance, gait and strength.  TARGET 06/25/2018    Baseline  06/23/18: met with current program    Status  Achieved    Target Date  06/25/18      PT SHORT TERM GOAL #2   Title  Pt will improve 5x sit<>stand to less than or equal to 12.5 seconds for improved transfer efficiency and lower extremity strength.    Baseline  06/23/18: 9.90 sec's no UE support from  standard height surface    Time  --    Period  --    Status  Achieved    Target Date  06/25/18      PT SHORT TERM GOAL #3   Title  Pt will improve TUG score to less than or equal to 13.5 sec for decreased fall risk.    Baseline  06/23/18: 7.13 sec's no device except for right AFO    Time  --    Period  --    Status  Achieved    Target Date  06/25/18      PT SHORT TERM GOAL #4   Title  Pt will improve DGI score to at least 17/24 for decreased fall risk.    Baseline  06/23/18: 19/24 scored today    Time  --    Period  --    Status  Achieved    Target Date  06/25/18      PT SHORT TERM GOAL #5   Title  Pt will negotiate 4 steps with alternating pattern, 1 rail modified independently, for preparation for return to work activities.    Baseline  06/23/18: met today    Time  --    Period  --    Status  Achieved    Target Date  06/25/18      PT SHORT TERM GOAL #6   Title  Pt will verbalize understanding of fall prevention in home environment.    Baseline  06/23/18: met today    Time  --    Period  --    Status  Achieved    Target Date  06/25/18        PT Long Term Goals - 05/27/18 1221      PT LONG TERM GOAL #1   Title  Pt will be independent with progressive HEP for strength, balance, and gait.  TARGET 07/23/2018    Time  9    Period  Weeks    Status  New    Target Date  07/23/18      PT LONG TERM GOAL #2   Title  Pt will perform 8 of 10 reps from surfaces <18" with no UE support, for improved transfer efficeincy and improved lower extremity strength.    Time  9    Period  Weeks    Status  New  Target Date  07/23/18      PT LONG TERM GOAL #3   Title  Pt will improve DGI score to at least 19/24 for decreased fall risk.    Time  9    Period  Weeks    Status  New    Target Date  07/23/18      PT LONG TERM GOAL #4   Title  Pt will ambulate at least 1000 ft, independently, no LOB, indoor and outdoor surfaces, for improved community gait.    Time  9    Period  Weeks     Status  New    Target Date  07/23/18      PT LONG TERM GOAL #5   Title  Pt will perform floor>stand transfer independently, for safe fall recovery/floor activities/return to work.    Time  9    Period  Weeks    Status  New    Target Date  07/23/18            Plan - 07/02/18 1208    Clinical Impression Statement  Focused on high level balance activities and compliant surface SLS/standing exercises today, with high level balance exercises added to HEP.  Pt continues to demonstrate decreased R knee control with stance leg extension/flexion activities, and pt will continue to benefit from skilled PT to address strength, balance, gait.    Comorbidities  hyperlipidemia, DM    Rehab Potential  Good    PT Frequency  Other (comment)    PT Duration  Other (comment)    PT Treatment/Interventions  ADLs/Self Care Home Management;Electrical Stimulation;DME Instruction;Gait training;Stair training;Functional mobility training;Therapeutic activities;Therapeutic exercise;Orthotic Fit/Training;Patient/family education;Neuromuscular re-education;Balance training;Manual techniques    PT Next Visit Plan  Review HEP for higher level strength, balance exercises; cont RLE strengthening - Rt gastrocs/knee control, SLS activities, work on compliant surface work; 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    PT Home Exercise Plan  Access Code: [email or web address] , plus gastroc stretch    Consulted and Agree with Plan of Care  Patient;Family member/caregiver    Family Member Consulted  Daughter       Patient will benefit from skilled therapeutic intervention in order to improve the following deficits and impairments:  Abnormal gait, Decreased balance, Decreased mobility, Difficulty walking, Decreased strength, Impaired flexibility, Impaired tone, Postural dysfunction  Visit Diagnosis: Unsteadiness on feet  Muscle weakness (generalized)  Other abnormalities of gait and mobility     Problem List Patient Active Problem List    Diagnosis Date Noted  . Left pontine cerebrovascular accident ([place]) 05/11/2018  . Tobacco abuse   . Uncontrolled type 2 diabetes mellitus with hyperglycemia ([place])   . CVA (cerebral vascular accident) ([place]) 05/06/2018  . Hyperlipidemia 05/30/2017    Malaina Mortellaro W. 07/02/2018, 12:12 PM Frazier Butt., PT  [facility] [address] [place], [place], [address] Phone: [phone]   Fax:  [phone]  Name: ORY ELTING MRN: [id number] Date of Birth: May 04, 1965

## 2018-07-02 NOTE — Therapy (Signed)
[facility] [address] [place], [place], [address] Phone: [phone]   Fax:  [phone]  Occupational Therapy Treatment  Patient Details  Name: Arthur Howard MRN: [id number] Date of Birth: September 25, 1965 No data recorded  Encounter Date: 07/02/2018  OT End of Session - 07/02/18 1517    Visit Number  10    Number of Visits  33    Date for OT Re-Evaluation  08/25/18    Authorization Type  [employer]    Authorization Time Period  12 weeks, 60 visit limit, counts as 1 visit if PT/OT    Authorization - Visit Number  10    Authorization - Number of Visits  84    OT Start Time  1103    OT Stop Time  1145    OT Time Calculation (min)  42 min       Past Medical History:  Diagnosis Date  . Diabetes mellitus without complication ([place])   . Hyperlipidemia     Past Surgical History:  Procedure Laterality Date  . ANKLE SURGERY      There were no vitals filed for this visit.  Subjective Assessment - 07/02/18 1520    Pertinent History  CVA, DM, hyperlipidemia    Limitations  non English speaking    Patient Stated Goals  recover use of RUE    Currently in Pain?  Yes    Pain Score  2     Pain Location  Hand    Pain Orientation  Right    Pain Descriptors / Indicators  Aching    Pain Type  Acute pain    Pain Onset  More than a month ago    Pain Frequency  Intermittent    Aggravating Factors   worse at night    Pain Relieving Factors  repositioning            Treatment: Neuro re-ed:  In supine, closed-chain/AAROM shoulder flex, chest press,  with ball  with min facilitation/cues for normal movement patterns.  Mid range shoulder flexion and circumduction with UE ranger, min-mod faciliation    Tall kneeling, rolling ball forward/back for AAROM shoulder flex with min facilitation/cueing for scapular mobility.   In prone, on elbows lifting chest  scapular retraction with min-mod facilitation/cues.    Gentle wrist  joint mobs , followed by weightbearing through bilateral UE's in seated with scapular retraction.  Edema glove issued for nighttime wear.             OT Education - 07/02/18 1524    Education Details  Edema glove wear at nighttime    Person(s) Educated  Patient;Child(ren)    Methods  Explanation;Demonstration;Handout;Verbal cues;Tactile cues    Comprehension  Verbalized understanding;Returned demonstration;Verbal cues required       OT Short Term Goals - 06/25/18 1205      OT SHORT TERM GOAL #1   Title  I with inital HEP.    Time  6    Period  Weeks    Status  On-going    Target Date  07/11/18      OT SHORT TERM GOAL #2   Title  Pt will demonstrate  improved RUE functional use as evidenced by increasing box/ blocks score by 8 blocks.    Baseline  RUE 48, LUE 75    Time  6    Period  Weeks    Status  On-going      OT SHORT TERM GOAL #3  Title  Pt will demonstrate ability to retrieve a light weight object from overhead shelf at 120 shoulder flexion with no more than min compensations.    Time  6    Period  Weeks    Status  On-going      OT SHORT TERM GOAL #4   Title  Pt will perfrom all basic ADLS modified independently.    Time  6    Period  Weeks    Status  On-going      OT SHORT TERM GOAL #5   Title  Pt will increase RUE grip strength to 35 lbs of greater for increased functional use.    Time  6    Period  Weeks    Status  On-going      OT SHORT TERM GOAL #6   Title  Pt will report using his RUE to assist with ADLs at least 50% of the time or greater.    Time  12    Period  Weeks    Status  On-going        OT Long Term Goals - 05/27/18 1326      OT LONG TERM GOAL #1   Title  Pt will demonstrate ability to retrieve a 3 lbs weight from overhead shelf demonstrating good control with RUE.    Time  12    Period  Weeks    Status  New    Target Date  08/25/18      OT LONG TERM GOAL #2   Title  Pt will perform mod complex home managment modified  independently.    Time  12    Period  Weeks    Status  New      OT LONG TERM GOAL #3   Title  Pt will demonstrate improved RUE fine motor coordination for ADLS as evidenced by completing 9 hole peg test in 38 secs or less.    Time  12    Period  Weeks    Status  New      OT LONG TERM GOAL #4   Title  Pt will report that he has returned to using his RUE as dominant hand at least 75% of the time for ADLs/IADLs..    Time  12    Period  Weeks    Status  New      OT LONG TERM GOAL #5   Title  Pt will demonstrate ability to write his name and address legibily using RUE.    Time  12    Period  Weeks    Status  New      Long Term Additional Goals   Additional Long Term Goals  Yes      OT LONG TERM GOAL #6   Title  Pt will perform simulated work activities modified indpendently demonstrating good safety awareness.    Time  12    Period  Weeks    Status  New            Plan - 07/02/18 1518    Clinical Impression Statement  Pt progressing with RUE functional use, pt reports decreased pain today.     Occupational performance deficits (Please refer to evaluation for details):  ADL's;IADL's;Work;Leisure;Social Participation    Body Structure / Function / Physical Skills  ADL;Dexterity;Flexibility;ROM;Strength;IADL;FMC;Coordination;Balance;UE functional use;GMC;Gait;Endurance;Decreased knowledge of precautions;Sensation    Rehab Potential  Good    OT Frequency  3x / week    OT Duration  4 weeks  followed by 2x week   OT Treatment/Interventions  Self-care/ADL training;Cryotherapy;Paraffin;Therapeutic exercise;DME and/or AE instruction;Functional Mobility Training;Balance training;Splinting;Manual Therapy;Neuromuscular education;Fluidtherapy;Ultrasound;Aquatic Therapy;Electrical Stimulation;Moist Heat;Contrast Bath;Energy conservation;Passive range of motion;Therapeutic activities;Patient/family education    Plan  NMR RUE/trunk,  closed chain to open chain reach     Consulted and  Agree with Plan of Care  Patient;Family member/caregiver    Family Member Consulted  dtr who interprets for him       Patient will benefit from skilled therapeutic intervention in order to improve the following deficits and impairments:  Body Structure / Function / Physical Skills  Visit Diagnosis: Muscle weakness (generalized)  Hemiplegia and hemiparesis following cerebral infarction affecting right dominant side (HCC)  Other lack of coordination  Abnormal posture  Other abnormalities of gait and mobility    Problem List Patient Active Problem List   Diagnosis Date Noted  . Left pontine cerebrovascular accident ([place]) 05/11/2018  . Tobacco abuse   . Uncontrolled type 2 diabetes mellitus with hyperglycemia ([place])   . CVA (cerebral vascular accident) ([place]) 05/06/2018  . Hyperlipidemia 05/30/2017    Bellatrix Devonshire 07/02/2018, 3:25 PM  [facility] [address] [place], [place], [address] Phone: [phone]   Fax:  [phone]  Name: JUWAUN INSKEEP MRN: [id number] Date of Birth: 06-May-1965

## 2018-07-05 ENCOUNTER — Other Ambulatory Visit: Payer: Self-pay

## 2018-07-05 ENCOUNTER — Encounter: Payer: Self-pay | Admitting: Physical Medicine & Rehabilitation

## 2018-07-05 ENCOUNTER — Ambulatory Visit (HOSPITAL_BASED_OUTPATIENT_CLINIC_OR_DEPARTMENT_OTHER): Payer: Commercial Indemnity | Admitting: Physical Medicine & Rehabilitation

## 2018-07-05 ENCOUNTER — Encounter: Payer: Commercial Indemnity | Attending: Registered Nurse

## 2018-07-05 VITALS — Ht 65.0 in | Wt 154.0 lb

## 2018-07-05 DIAGNOSIS — I639 Cerebral infarction, unspecified: Secondary | ICD-10-CM

## 2018-07-05 DIAGNOSIS — E1165 Type 2 diabetes mellitus with hyperglycemia: Secondary | ICD-10-CM | POA: Insufficient documentation

## 2018-07-05 DIAGNOSIS — E7849 Other hyperlipidemia: Secondary | ICD-10-CM | POA: Insufficient documentation

## 2018-07-05 DIAGNOSIS — I6350 Cerebral infarction due to unspecified occlusion or stenosis of unspecified cerebral artery: Secondary | ICD-10-CM

## 2018-07-05 NOTE — Patient Instructions (Signed)
Gabapentin 2 tablet  Call in 1 wk if pain is not better

## 2018-07-05 NOTE — Progress Notes (Addendum)
Subjective:    Patient ID: Arthur Howard, male    DOB: September 09, 1965, 53 y.o.   MRN: [id number] televisit audio only consent from pt via daughter who is interpreting 15 min visit   HPI Dressing and bathing  Ambulating with AFO when outside the house [place] on Sunday tripped but did not get injured Right sided shoulder pain at night, radiate to finger No prior shoulder pain prior to stroke No shoulder injury, no prior issues  Intermittent finger swelling  Gabapentin 100mg  BID   June 4 th f/u visit with PCP  Pain Inventory Average Pain 7 Pain Right Now 4 My pain is intermittent, sharp and aching  In the last 24 hours, has pain interfered with the following? General activity 4 Relation with others 4 Enjoyment of life 4 What TIME of day is your pain at its worst? morning and night Sleep (in general) Fair  Pain is worse with: inactivity Pain improves with: medication Relief from Meds: 2  Mobility how many minutes can you walk? 15 ability to climb steps?  yes do you drive?  no  Function not employed: date last employed na disabled: date disabled na I need assistance with the following:  shopping  Neuro/Psych weakness numbness trouble walking  Prior Studies Any changes since last visit?  no  Physicians involved in your care Any changes since last visit?  no   Family History  Problem Relation Age of Onset  . Diabetes Sister   . Diabetes Brother   . CVA Neg Hx    Social History   Socioeconomic History  . Marital status: Married    Spouse name: Not on file  . Number of children: Not on file  . Years of education: Not on file  . Highest education level: Not on file  Occupational History  . Occupation: Sales executive    Comment: [employer]  Social Needs  . Financial resource strain: Not on file  . Food insecurity:    Worry: Not on file    Inability: Not on file  . Transportation needs:    Medical: Not on file    Non-medical: Not on file  Tobacco Use  .  Smoking status: Former Research scientist (life sciences)  . Smokeless tobacco: Never Used  . Tobacco comment: never a daily smoker  Substance and Sexual Activity  . Alcohol use: No    Alcohol/week: 0.0 standard drinks  . Drug use: No  . Sexual activity: Not on file  Lifestyle  . Physical activity:    Days per week: Not on file    Minutes per session: Not on file  . Stress: Not on file  Relationships  . Social connections:    Talks on phone: Not on file    Gets together: Not on file    Attends religious service: Not on file    Active member of club or organization: Not on file    Attends meetings of clubs or organizations: Not on file    Relationship status: Not on file  Other Topics Concern  . Not on file  Social History Narrative   Works with Sales executive. Lives in [place]. Moved from [place] 35 years ago. He is married. Has 7 children (all girls).    Past Surgical History:  Procedure Laterality Date  . ANKLE SURGERY     Past Medical History:  Diagnosis Date  . Diabetes mellitus without complication ([place])   . Hyperlipidemia    Ht 5\' 5"  (1.651 m)   Wt 154 lb (69.9 kg)  BMI 25.63 kg/m   Opioid Risk Score:   Fall Risk Score:  `1  Depression screen PHQ 2/9  Depression screen [facility] 2/9 07/05/2018 05/26/2018 05/30/2017 09/20/2016 09/18/2016 03/04/2016 11/10/2015  Decreased Interest 0 0 0 0 0 0 0  Down, Depressed, Hopeless 0 0 0 0 0 0 0  PHQ - 2 Score 0 0 0 0 0 0 0    Review of Systems  Constitutional: Negative.   HENT: Negative.   Eyes: Negative.   Respiratory: Negative.   Cardiovascular: Negative.   Gastrointestinal: Negative.   Endocrine: Negative.   Genitourinary: Negative.   Musculoskeletal: Positive for myalgias.  Skin: Negative.   Allergic/Immunologic: Negative.   Neurological: Positive for weakness.  Hematological: Negative.   Psychiatric/Behavioral: Negative.   All other systems reviewed and are negative.      Objective:   Physical Exam  Web ex Daughter is interpreting,  patient only speaks Spanish     Assessment & Plan:  1.  Left pontine infarct with right hemiparesis we discussed usual time course of recovery.  Daughter is asking when the patient would be able to go back to work and drive.  He will be about 3 months post stroke when I see him next month and we can discuss this in more detail.  He will continue outpatient PT and OT until that time.  2.  Post stroke shoulder pain we discussed that this is a very common condition affecting at least 50% of patients with hemiplegia.  We discussed medication such as gabapentin can be helpful and we will increase this to 200 mg nightly.  The daytime dose of the gabapentin may be causing some drowsiness in this patient  3.  Upper extremity edema post stroke we also discussed that this is a common condition we discussed elevating the arm and performing retrograde massage which the patient's daughter is doing  4.  Diabetes will follow-up with Dr. Jerilee Hoh in June

## 2018-07-07 ENCOUNTER — Other Ambulatory Visit: Payer: Self-pay

## 2018-07-07 ENCOUNTER — Ambulatory Visit: Payer: Commercial Indemnity | Admitting: Occupational Therapy

## 2018-07-07 ENCOUNTER — Encounter: Payer: Self-pay | Admitting: Physical Therapy

## 2018-07-07 ENCOUNTER — Ambulatory Visit: Payer: Commercial Indemnity | Admitting: Physical Therapy

## 2018-07-07 DIAGNOSIS — M6281 Muscle weakness (generalized): Secondary | ICD-10-CM

## 2018-07-07 DIAGNOSIS — R278 Other lack of coordination: Secondary | ICD-10-CM

## 2018-07-07 DIAGNOSIS — R2681 Unsteadiness on feet: Secondary | ICD-10-CM

## 2018-07-07 DIAGNOSIS — I6350 Cerebral infarction due to unspecified occlusion or stenosis of unspecified cerebral artery: Secondary | ICD-10-CM | POA: Diagnosis not present

## 2018-07-07 DIAGNOSIS — I69351 Hemiplegia and hemiparesis following cerebral infarction affecting right dominant side: Secondary | ICD-10-CM

## 2018-07-07 DIAGNOSIS — R2689 Other abnormalities of gait and mobility: Secondary | ICD-10-CM

## 2018-07-07 NOTE — Therapy (Signed)
[facility] [address] [place], [place], [address] Phone: [phone]   Fax:  [phone]  Physical Therapy Treatment  Patient Details  Name: Arthur Howard MRN: [id number] Date of Birth: Mar 18, 1966 Referring Provider (PT): Angiulli, Chapman Fitch (from hospital-f/u to be with Sherald Barge, NP)   Encounter Date: 07/07/2018  PT End of Session - 07/07/18 1019    Visit Number  11    Number of Visits  22    Date for PT Re-Evaluation  08/25/18    Authorization Type  [employer]-60 visits combined PT and OT if on same day    Authorization - Visit Number  11    Authorization - Number of Visits  60    PT Start Time  9983    PT Stop Time  1100    PT Time Calculation (min)  45 min    Equipment Utilized During Treatment  Gait belt    Activity Tolerance  Patient tolerated treatment well;No increased pain    Behavior During Therapy  WFL for tasks assessed/performed       Past Medical History:  Diagnosis Date  . Diabetes mellitus without complication ([place])   . Hyperlipidemia     Past Surgical History:  Procedure Laterality Date  . ANKLE SURGERY      There were no vitals filed for this visit.  Subjective Assessment - 07/07/18 1016    Subjective  No new complaints. No falls to report. Some right arm pain, 4/10 at shoulder and 6/10 at hand.     Patient is accompained by:  Family member    Patient Stated Goals  Pt's goal is to improve R hand, arm, leg strength.    Currently in Pain?  Yes    Pain Score  6     Pain Location  Hand    Pain Orientation  Right    Pain Descriptors / Indicators  Aching    Pain Type  Acute pain    Pain Onset  More than a month ago    Pain Frequency  Intermittent    Aggravating Factors   worse at night    Pain Relieving Factors  repositioning    Pain Score  4    Pain Location  Shoulder    Pain Orientation  Right    Pain Descriptors / Indicators  Aching    Pain Type  Acute pain    Pain  Onset  More than a month ago    Pain Frequency  Intermittent    Aggravating Factors   certain movements    Pain Relieving Factors  repositioning           [facility] Adult PT Treatment/Exercise - 07/07/18 1025      Self-Care   Self-Care  Other Self-Care Comments    Other Self-Care Comments   verbally reviewed [employer] HEP from last session with no issues noted or reported.       Neuro Re-ed    Neuro Re-ed Details   for strengthening/muscle re-ed/coordination: on balance board with right leg in stance- left heel taps forward<>backward stance on floor (pt stepping over balance board) for 10 reps, emphasis on right knee control and push off with right leg to return the left leg back to starting place; bil stance on balance board- alternating fwd toe taps to tall cones, progressing to alternating cross toe taps to tall cones, progressing to alternating fwd double toe taps to tall cones, ending with alternating cross double toe taps  to tall cones. 8-10 reps each with intermittent touch to bars for balance. cues needed on upright posture and weight shifitng; standing next to counter top with red band around bil legs just above knees- in squat position side stepping left<>right for 3 laps, fwd walking lunges for 3 laps down/back, then fwd diagonal stepping in squat position for 3 laps down/back. min guard to min assist for balance with cues on posture and ex form/technique needed.                             Knee/Hip Exercises: Aerobic   Stepper  SciFit Stepper, Level 3.0, lower extremities/left UE x 6 minutes for leg strengthening/activity tolerance with goal >/= 50 rpm      Knee/Hip Exercises: Machines for Strengthening   Cybex Leg Press  bil LE's 70# for 10 reps with 3 sec holds, then right LE only 40#'s- 2 sets of 10 reps with 3 sec holds. emphasis on right knee control with each rep and slow, controlled movements.                PT Short Term Goals - 06/23/18 1106      PT SHORT TERM  GOAL #1   Title  Pt will be independent with HEP for improved transfers, balance, gait and strength.  TARGET 06/25/2018    Baseline  06/23/18: met with current program    Status  Achieved    Target Date  06/25/18      PT SHORT TERM GOAL #2   Title  Pt will improve 5x sit<>stand to less than or equal to 12.5 seconds for improved transfer efficiency and lower extremity strength.    Baseline  06/23/18: 9.90 sec's no UE support from standard height surface    Time  --    Period  --    Status  Achieved    Target Date  06/25/18      PT SHORT TERM GOAL #3   Title  Pt will improve TUG score to less than or equal to 13.5 sec for decreased fall risk.    Baseline  06/23/18: 7.13 sec's no device except for right AFO    Time  --    Period  --    Status  Achieved    Target Date  06/25/18      PT SHORT TERM GOAL #4   Title  Pt will improve DGI score to at least 17/24 for decreased fall risk.    Baseline  06/23/18: 19/24 scored today    Time  --    Period  --    Status  Achieved    Target Date  06/25/18      PT SHORT TERM GOAL #5   Title  Pt will negotiate 4 steps with alternating pattern, 1 rail modified independently, for preparation for return to work activities.    Baseline  06/23/18: met today    Time  --    Period  --    Status  Achieved    Target Date  06/25/18      PT SHORT TERM GOAL #6   Title  Pt will verbalize understanding of fall prevention in home environment.    Baseline  06/23/18: met today    Time  --    Period  --    Status  Achieved    Target Date  06/25/18        PT Long Term Goals -  05/27/18 1221      PT LONG TERM GOAL #1   Title  Pt will be independent with progressive HEP for strength, balance, and gait.  TARGET 07/23/2018    Time  9    Period  Weeks    Status  New    Target Date  07/23/18      PT LONG TERM GOAL #2   Title  Pt will perform 8 of 10 reps from surfaces <18" with no UE support, for improved transfer efficeincy and improved lower extremity strength.     Time  9    Period  Weeks    Status  New    Target Date  07/23/18      PT LONG TERM GOAL #3   Title  Pt will improve DGI score to at least 19/24 for decreased fall risk.    Time  9    Period  Weeks    Status  New    Target Date  07/23/18      PT LONG TERM GOAL #4   Title  Pt will ambulate at least 1000 ft, independently, no LOB, indoor and outdoor surfaces, for improved community gait.    Time  9    Period  Weeks    Status  New    Target Date  07/23/18      PT LONG TERM GOAL #5   Title  Pt will perform floor>stand transfer independently, for safe fall recovery/floor activities/return to work.    Time  9    Period  Weeks    Status  New    Target Date  07/23/18          Plan - 07/07/18 1019    Clinical Impression Statement  Today's skilled session focused on brief review of advanced HEP issued at last session. Remainder of session continued to address LE strengthening and balance reactions. No issues reported or noted in session today.     Comorbidities  hyperlipidemia, DM    Rehab Potential  Good    PT Frequency  Other (comment)    PT Duration  Other (comment)    PT Treatment/Interventions  ADLs/Self Care Home Management;Electrical Stimulation;DME Instruction;Gait training;Stair training;Functional mobility training;Therapeutic activities;Therapeutic exercise;Orthotic Fit/Training;Patient/family education;Neuromuscular re-education;Balance training;Manual techniques    PT Next Visit Plan  higher level strength, balance exercises; cont RLE strengthening - Rt gastrocs/knee control, SLS activities, work on compliant surface work; 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    PT Home Exercise Plan  Access Code: [email or web address] , plus gastroc stretch    Consulted and Agree with Plan of Care  Patient;Family member/caregiver    Family Member Consulted  Daughter       Patient will benefit from skilled therapeutic intervention in order to improve the following deficits and impairments:  Abnormal gait,  Decreased balance, Decreased mobility, Difficulty walking, Decreased strength, Impaired flexibility, Impaired tone, Postural dysfunction  Visit Diagnosis: Muscle weakness (generalized)  Other abnormalities of gait and mobility  Unsteadiness on feet     Problem List Patient Active Problem List   Diagnosis Date Noted  . Left pontine cerebrovascular accident ([place]) 05/11/2018  . Tobacco abuse   . Uncontrolled type 2 diabetes mellitus with hyperglycemia ([place])   . CVA (cerebral vascular accident) ([place]) 05/06/2018  . Hyperlipidemia 05/30/2017    Willow Ora, PTA, [facility] 2 Outpatient Neuro [facility] [address], [place],  [address] [phone] 07/07/18, 1:45 PM   Name: Arthur Howard MRN: [id number] Date of Birth: [birth year]

## 2018-07-07 NOTE — Therapy (Signed)
[facility] [address] [place], [place], [address] Phone: [phone]   Fax:  [phone]  Occupational Therapy Treatment  Patient Details  Name: Arthur Howard MRN: [id number] Date of Birth: [birth year] No data recorded  Encounter Date: 07/07/2018  OT End of Session - 07/07/18 1218    Visit Number  11    Number of Visits  33    Date for OT Re-Evaluation  08/25/18    Authorization Type  [employer]    Authorization Time Period  12 weeks, 60 visit limit, counts as 1 visit if PT/OT    Authorization - Visit Number  11    Authorization - Number of Visits  33    OT Start Time  1105    OT Stop Time  1147    OT Time Calculation (min)  42 min    Activity Tolerance  Patient tolerated treatment well    Behavior During Therapy  WFL for tasks assessed/performed       Past Medical History:  Diagnosis Date  . Diabetes mellitus without complication ([place])   . Hyperlipidemia     Past Surgical History:  Procedure Laterality Date  . ANKLE SURGERY      There were no vitals filed for this visit.  Subjective Assessment - 07/07/18 1217    Subjective   Pt reports continued hand pain in RUE    Pertinent History  CVA, DM, hyperlipidemia    Limitations  non English speaking    Patient Stated Goals  recover use of RUE    Currently in Pain?  Yes    Pain Score  5     Pain Location  Hand    Pain Orientation  Right    Pain Descriptors / Indicators  Aching    Pain Type  Acute pain    Pain Onset  More than a month ago    Pain Frequency  Intermittent    Aggravating Factors   worse at night    Pain Relieving Factors  repositioning            Treatment: Pt reports significant pain in his right hand. Pt/ dtr were instructed in edema massage followed by A/ROM/ AA/ROM finger flexion, opposition, finger abduction/ adduction, wrist flexion/ extension and supination pronation, min v.c Closed chain shoulder flexion mid range min-mod  facilitation, followed by unilateral shoulder flexion with UE ranger, and circumduction, min-mod facilitation. Shoulder extension with cane closed chain with scapular retraction, min v.c Prone on elbows for lifting chest then modified plank, min v.c, for scapular stability and strength                OT Education - 07/07/18 1447    Education Details  edema massage, A/ROM for edema control.    Person(s) Educated  Patient;Child(ren)    Methods  Explanation;Demonstration;Handout;Verbal cues;Tactile cues    Comprehension  Verbalized understanding;Returned demonstration;Verbal cues required       OT Short Term Goals - 06/25/18 1205      OT SHORT TERM GOAL #1   Title  I with inital HEP.    Time  6    Period  Weeks    Status  On-going    Target Date  07/11/18      OT SHORT TERM GOAL #2   Title  Pt will demonstrate  improved RUE functional use as evidenced by increasing box/ blocks score by 8 blocks.    Baseline  RUE 48, LUE 75  Time  6    Period  Weeks    Status  On-going      OT SHORT TERM GOAL #3   Title  Pt will demonstrate ability to retrieve a light weight object from overhead shelf at 120 shoulder flexion with no more than min compensations.    Time  6    Period  Weeks    Status  On-going      OT SHORT TERM GOAL #4   Title  Pt will perfrom all basic ADLS modified independently.    Time  6    Period  Weeks    Status  On-going      OT SHORT TERM GOAL #5   Title  Pt will increase RUE grip strength to 35 lbs of greater for increased functional use.    Time  6    Period  Weeks    Status  On-going      OT SHORT TERM GOAL #6   Title  Pt will report using his RUE to assist with ADLs at least 50% of the time or greater.    Time  12    Period  Weeks    Status  On-going        OT Long Term Goals - 05/27/18 1326      OT LONG TERM GOAL #1   Title  Pt will demonstrate ability to retrieve a 3 lbs weight from overhead shelf demonstrating good control with RUE.     Time  12    Period  Weeks    Status  New    Target Date  08/25/18      OT LONG TERM GOAL #2   Title  Pt will perform mod complex home managment modified independently.    Time  12    Period  Weeks    Status  New      OT LONG TERM GOAL #3   Title  Pt will demonstrate improved RUE fine motor coordination for ADLS as evidenced by completing 9 hole peg test in 38 secs or less.    Time  12    Period  Weeks    Status  New      OT LONG TERM GOAL #4   Title  Pt will report that he has returned to using his RUE as dominant hand at least 75% of the time for ADLs/IADLs..    Time  12    Period  Weeks    Status  New      OT LONG TERM GOAL #5   Title  Pt will demonstrate ability to write his name and address legibily using RUE.    Time  12    Period  Weeks    Status  New      Long Term Additional Goals   Additional Long Term Goals  Yes      OT LONG TERM GOAL #6   Title  Pt will perform simulated work activities modified indpendently demonstrating good safety awareness.    Time  12    Period  Weeks    Status  New            Plan - 07/07/18 1219    Clinical Impression Statement  Pt is progressing towards goals, however he remains limited by right hand and shoulder pain     Occupational performance deficits (Please refer to evaluation for details):  ADL's;IADL's;Work;Leisure;Social Participation    Body Structure / Function / Physical Skills  ADL;Dexterity;Flexibility;ROM;Strength;IADL;FMC;Coordination;Balance;UE  functional use;GMC;Gait;Endurance;Decreased knowledge of precautions;Sensation    Rehab Potential  Good    OT Frequency  2x / week    OT Duration  8 weeks    OT Treatment/Interventions  Self-care/ADL training;Cryotherapy;Paraffin;Therapeutic exercise;DME and/or AE instruction;Functional Mobility Training;Balance training;Splinting;Manual Therapy;Neuromuscular education;Fluidtherapy;Ultrasound;Aquatic Therapy;Electrical Stimulation;Moist Heat;Contrast Bath;Energy  conservation;Passive range of motion;Therapeutic activities;Patient/family education    Plan  NMR RUE/trunk,  closed chain to open chain reach     Consulted and Agree with Plan of Care  Patient;Family member/caregiver    Family Member Consulted  dtr who interprets for him       Patient will benefit from skilled therapeutic intervention in order to improve the following deficits and impairments:  Body Structure / Function / Physical Skills  Visit Diagnosis: Muscle weakness (generalized)  Hemiplegia and hemiparesis following cerebral infarction affecting right dominant side ([place])  Other lack of coordination    Problem List Patient Active Problem List   Diagnosis Date Noted  . Left pontine cerebrovascular accident ([place]) 05/11/2018  . Tobacco abuse   . Uncontrolled type 2 diabetes mellitus with hyperglycemia ([place])   . CVA (cerebral vascular accident) ([place]) 05/06/2018  . Hyperlipidemia 05/30/2017    , 07/07/2018, 2:48 PM  [facility] [address] [place], [place], [address] Phone: [phone]   Fax:  [phone]  Name: Arthur Howard MRN: [id number] Date of Birth: November 15, 1965

## 2018-07-09 ENCOUNTER — Ambulatory Visit: Payer: Commercial Indemnity | Admitting: Physical Therapy

## 2018-07-09 ENCOUNTER — Encounter: Payer: Self-pay | Admitting: Physical Therapy

## 2018-07-09 ENCOUNTER — Ambulatory Visit: Payer: Commercial Indemnity | Admitting: Occupational Therapy

## 2018-07-09 ENCOUNTER — Other Ambulatory Visit: Payer: Self-pay

## 2018-07-09 DIAGNOSIS — R293 Abnormal posture: Secondary | ICD-10-CM

## 2018-07-09 DIAGNOSIS — M6281 Muscle weakness (generalized): Secondary | ICD-10-CM

## 2018-07-09 DIAGNOSIS — R278 Other lack of coordination: Secondary | ICD-10-CM

## 2018-07-09 DIAGNOSIS — R2681 Unsteadiness on feet: Secondary | ICD-10-CM

## 2018-07-09 DIAGNOSIS — I69351 Hemiplegia and hemiparesis following cerebral infarction affecting right dominant side: Secondary | ICD-10-CM

## 2018-07-09 DIAGNOSIS — R2689 Other abnormalities of gait and mobility: Secondary | ICD-10-CM

## 2018-07-09 DIAGNOSIS — I6350 Cerebral infarction due to unspecified occlusion or stenosis of unspecified cerebral artery: Secondary | ICD-10-CM | POA: Diagnosis not present

## 2018-07-09 NOTE — Therapy (Signed)
[facility] [address] [place], [place], [address] Phone: [phone]   Fax:  [phone]  Physical Therapy Treatment  Patient Details  Name: Arthur Howard MRN: [id number] Date of Birth: 01-15-1966 Referring Provider (PT): Angiulli, Chapman Fitch (from hospital-f/u to be with Sherald Barge, NP)   Encounter Date: 07/09/2018  PT End of Session - 07/09/18 1223    Visit Number  12    Number of Visits  22    Date for PT Re-Evaluation  08/25/18    Authorization Type  [employer]-60 visits combined PT and OT if on same day    Authorization - Visit Number  12    Authorization - Number of Visits  60    PT Start Time  9767    PT Stop Time  1100    PT Time Calculation (min)  45 min    Equipment Utilized During Treatment  Other (comment)   Bioness   Activity Tolerance  Patient tolerated treatment well    Behavior During Therapy  [facility] for tasks assessed/performed       Past Medical History:  Diagnosis Date  . Diabetes mellitus without complication ([place])   . Hyperlipidemia     Past Surgical History:  Procedure Laterality Date  . ANKLE SURGERY      There were no vitals filed for this visit.  Subjective Assessment - 07/09/18 1016    Subjective  Pain in RUE has increased some today.  5-6/10 at shoulder and 7/10 in hand.  Also having some pain in his R hip when ambulating.  Isn't sure if it is soreness from exercises.    Patient is accompained by:  Family member    Patient Stated Goals  Pt's goal is to improve R hand, arm, leg strength.    Currently in Pain?  Yes    Pain Score  3     Pain Location  Hip    Pain Orientation  Right    Pain Descriptors / Indicators  Tightness    Pain Type  Acute pain    Pain Onset  More than a month ago    Pain Onset  More than a month ago                       [facility] Adult PT Treatment/Exercise - 07/09/18 1021      Ambulation/Gait   Ambulation/Gait  Yes    Ambulation/Gait Assistance  5: Supervision    Ambulation/Gait Assistance Details  without AFO but with Bioness donned for ankle DF activation during swing and intial stance to advance tibia over foot; without Bioness and without AFO pt noted to have decreased eccentric control of knee extension during terminal swing and foot slap at heel strike.  With Bioness foot slap is mitigated and pt demonstrates increased gait speed    Ambulation Distance (Feet)  230 Feet    Assistive device  None    Gait Pattern  Step-through pattern;Decreased arm swing - right;Decreased dorsiflexion - right    Ambulation Surface  Level;Indoor      Neuro Re-ed    Neuro Re-ed Details   with use of Bioness on R lower leg during gait and also during balance training on balance beams for increased open and closed chain ankle DF activation      Knee/Hip Exercises: Stretches   Personal assistant reps;30 seconds    Quad Stretch Limitations  prone with belt    Hip Flexor  Stretch  Right;2 reps;30 seconds    Hip Flexor Stretch Limitations  prone with belt      Modalities   Modalities  Electrical engineer Stimulation Location  R anterior tibialis    Electrical Stimulation Action  open and closed chain ankle DF    Electrical Stimulation Parameters  see Tablet 1; steering electrode    Electrical Stimulation Goals  Strength;Neuromuscular facilitation          Balance Exercises - 07/09/18 1222      Balance Exercises: Standing   Balance Beam  with Bioness: wooden balance beam: tandem gait forwards and retro x 4 reps with UE support and x4 reps without UE support.  Also performed on wooden beam side stepping to L and R without UE support.  Transitioned to more compliant blue foam beam performing 2 laps each tandem gait and side stepping to L and R with min A.        PT Education - 07/09/18 1223    Education Details  updated HEP to include hip stretches    Person(s) Educated   Patient;Child(ren)    Methods  Explanation;Demonstration;Handout    Comprehension  Verbalized understanding;Returned demonstration       PT Short Term Goals - 06/23/18 1106      PT SHORT TERM GOAL #1   Title  Pt will be independent with HEP for improved transfers, balance, gait and strength.  TARGET 06/25/2018    Baseline  06/23/18: met with current program    Status  Achieved    Target Date  06/25/18      PT SHORT TERM GOAL #2   Title  Pt will improve 5x sit<>stand to less than or equal to 12.5 seconds for improved transfer efficiency and lower extremity strength.    Baseline  06/23/18: 9.90 sec's no UE support from standard height surface    Time  --    Period  --    Status  Achieved    Target Date  06/25/18      PT SHORT TERM GOAL #3   Title  Pt will improve TUG score to less than or equal to 13.5 sec for decreased fall risk.    Baseline  06/23/18: 7.13 sec's no device except for right AFO    Time  --    Period  --    Status  Achieved    Target Date  06/25/18      PT SHORT TERM GOAL #4   Title  Pt will improve DGI score to at least 17/24 for decreased fall risk.    Baseline  06/23/18: 19/24 scored today    Time  --    Period  --    Status  Achieved    Target Date  06/25/18      PT SHORT TERM GOAL #5   Title  Pt will negotiate 4 steps with alternating pattern, 1 rail modified independently, for preparation for return to work activities.    Baseline  06/23/18: met today    Time  --    Period  --    Status  Achieved    Target Date  06/25/18      PT SHORT TERM GOAL #6   Title  Pt will verbalize understanding of fall prevention in home environment.    Baseline  06/23/18: met today    Time  --    Period  --    Status  Achieved  Target Date  06/25/18        PT Long Term Goals - 05/27/18 1221      PT LONG TERM GOAL #1   Title  Pt will be independent with progressive HEP for strength, balance, and gait.  TARGET 07/23/2018    Time  9    Period  Weeks    Status  New     Target Date  07/23/18      PT LONG TERM GOAL #2   Title  Pt will perform 8 of 10 reps from surfaces <18" with no UE support, for improved transfer efficeincy and improved lower extremity strength.    Time  9    Period  Weeks    Status  New    Target Date  07/23/18      PT LONG TERM GOAL #3   Title  Pt will improve DGI score to at least 19/24 for decreased fall risk.    Time  9    Period  Weeks    Status  New    Target Date  07/23/18      PT LONG TERM GOAL #4   Title  Pt will ambulate at least 1000 ft, independently, no LOB, indoor and outdoor surfaces, for improved community gait.    Time  9    Period  Weeks    Status  New    Target Date  07/23/18      PT LONG TERM GOAL #5   Title  Pt will perform floor>stand transfer independently, for safe fall recovery/floor activities/return to work.    Time  9    Period  Weeks    Status  New    Target Date  07/23/18            Plan - 07/09/18 1224    Clinical Impression Statement  Treatment session focused on assessment of hip pain during gait and providing pt with lateral hip and hip flexor stretches to address mm tightness and pain with improvement in pain in hip and shoulder with hip stretches.  Returned to use of Bioness on R anterior tibialis for gait training and NMR during balance training on narrow surfaces to simulate work duties. Pt tolerated well; will continue to address and progress towards LTG.    Comorbidities  hyperlipidemia, DM    Rehab Potential  Good    PT Frequency  Other (comment)    PT Duration  Other (comment)    PT Treatment/Interventions  ADLs/Self Care Home Management;Electrical Stimulation;DME Instruction;Gait training;Stair training;Functional mobility training;Therapeutic activities;Therapeutic exercise;Orthotic Fit/Training;Patient/family education;Neuromuscular re-education;Balance training;Manual techniques    PT Next Visit Plan  continue use of Bioness on R lower leg.  higher level balance simulating  work tasks.  Floor transfers.    PT Home Exercise Plan  Access Code: [email or web address] , plus gastroc stretch    Consulted and Agree with Plan of Care  Patient;Family member/caregiver    Family Member Consulted  Daughter       Patient will benefit from skilled therapeutic intervention in order to improve the following deficits and impairments:  Abnormal gait, Decreased balance, Decreased mobility, Difficulty walking, Decreased strength, Impaired flexibility, Impaired tone, Postural dysfunction  Visit Diagnosis: Muscle weakness (generalized)  Other abnormalities of gait and mobility  Unsteadiness on feet     Problem List Patient Active Problem List   Diagnosis Date Noted  . Left pontine cerebrovascular accident ([place]) 05/11/2018  . Tobacco abuse   . Uncontrolled type 2 diabetes mellitus with hyperglycemia (  [place])   . CVA (cerebral vascular accident) ([place]) 05/06/2018  . Hyperlipidemia 05/30/2017    Rico Junker, PT, DPT 07/09/18    12:32 PM    [facility] [address] [place], [place], [address] Phone: [phone]   Fax:  [phone]  Name: Arthur Howard MRN: [id number] Date of Birth: [birth year]

## 2018-07-09 NOTE — Patient Instructions (Signed)
Access Code: [email or web address]  URL: [email or web address]  Date: 07/09/2018  Prepared by: Misty Stanley   Exercises  Sidestepping in Squat with Resistance and Arms Forward - 10 reps - 2 sets - 1x daily - 6x weekly  Forward Step Touch - 10 reps - 2 sets - 1x daily - 6x weekly  Walking Tandem Stance - 10 reps - 2 sets - 1x daily - 6x weekly  Single Leg Stance on Foam Pad - 3 reps - 1 sets - 10 hold - 1x daily - 6x weekly  Braided Sidestepping - 10 reps - 2 sets - 1x daily - 6x weekly  Supine Figure 4 Piriformis Stretch - 2 sets - 30 second hold - 2x daily - 7x weekly  Prone Quadriceps Stretch with Strap - 2 sets - 30 seconds hold - 2x daily - 7x weekly

## 2018-07-09 NOTE — Therapy (Signed)
[facility] [address] [place], [place], [address] Phone: [phone]   Fax:  [phone]  Occupational Therapy Treatment  Patient Details  Name: Arthur Howard MRN: [id number] Date of Birth: [birth year] No data recorded  Encounter Date: 07/09/2018  OT End of Session - 07/09/18 1401    Visit Number  12    Number of Visits  33    Date for OT Re-Evaluation  08/25/18    Authorization Type  [employer]    Authorization Time Period  12 weeks, 60 visit limit, counts as 1 visit if PT/OT    Authorization - Visit Number  12    Authorization - Number of Visits  60    OT Start Time  1104    OT Stop Time  1145    OT Time Calculation (min)  41 min    Activity Tolerance  Patient tolerated treatment well    Behavior During Therapy  WFL for tasks assessed/performed       Past Medical History:  Diagnosis Date  . Diabetes mellitus without complication ([place])   . Hyperlipidemia     Past Surgical History:  Procedure Laterality Date  . ANKLE SURGERY      There were no vitals filed for this visit.  Subjective Assessment - 07/09/18 1405    Pertinent History  CVA, DM, hyperlipidemia    Limitations  non English speaking    Patient Stated Goals  recover use of RUE    Currently in Pain?  Yes    Pain Score  5     Pain Location  Shoulder   hand   Pain Orientation  Right    Pain Descriptors / Indicators  Aching;Tightness    Pain Type  Acute pain    Pain Onset  More than a month ago    Pain Frequency  Intermittent    Aggravating Factors   worse at night    Pain Relieving Factors  repositioning       Supine: Lower trunk rotation with gentle RUE shoulder abduction ans tolerated, pt reported decreased tightness after activity Supine closed chain shoulder flexion and chest press, min facilitation.  Seated on ball lateral trunk movements and A-p tilts followed by low range closed chain shoulder flexion in seated, min -mod  faciltiation. Standing for midrange shoulder flexion and circumduction, min faciiltation with UE ranger.  Gentle passive finger flexion and wrist flexion/ extension with gentle distraction, pt's dtr was shown how to perform and to stop if pt has pain. Pt reports a significant reduction in pain by end of session.                      OT Short Term Goals - 06/25/18 1205      OT SHORT TERM GOAL #1   Title  I with inital HEP.    Time  6    Period  Weeks    Status  On-going    Target Date  07/11/18      OT SHORT TERM GOAL #2   Title  Pt will demonstrate  improved RUE functional use as evidenced by increasing box/ blocks score by 8 blocks.    Baseline  RUE 48, LUE 75    Time  6    Period  Weeks    Status  On-going      OT SHORT TERM GOAL #3   Title  Pt will demonstrate ability to retrieve a light weight object from  overhead shelf at 120 shoulder flexion with no more than min compensations.    Time  6    Period  Weeks    Status  On-going      OT SHORT TERM GOAL #4   Title  Pt will perfrom all basic ADLS modified independently.    Time  6    Period  Weeks    Status  On-going      OT SHORT TERM GOAL #5   Title  Pt will increase RUE grip strength to 35 lbs of greater for increased functional use.    Time  6    Period  Weeks    Status  On-going      OT SHORT TERM GOAL #6   Title  Pt will report using his RUE to assist with ADLs at least 50% of the time or greater.    Time  12    Period  Weeks    Status  On-going        OT Long Term Goals - 05/27/18 1326      OT LONG TERM GOAL #1   Title  Pt will demonstrate ability to retrieve a 3 lbs weight from overhead shelf demonstrating good control with RUE.    Time  12    Period  Weeks    Status  New    Target Date  08/25/18      OT LONG TERM GOAL #2   Title  Pt will perform mod complex home managment modified independently.    Time  12    Period  Weeks    Status  New      OT LONG TERM GOAL #3   Title  Pt  will demonstrate improved RUE fine motor coordination for ADLS as evidenced by completing 9 hole peg test in 38 secs or less.    Time  12    Period  Weeks    Status  New      OT LONG TERM GOAL #4   Title  Pt will report that he has returned to using his RUE as dominant hand at least 75% of the time for ADLs/IADLs..    Time  12    Period  Weeks    Status  New      OT LONG TERM GOAL #5   Title  Pt will demonstrate ability to write his name and address legibily using RUE.    Time  12    Period  Weeks    Status  New      Long Term Additional Goals   Additional Long Term Goals  Yes      OT LONG TERM GOAL #6   Title  Pt will perform simulated work activities modified indpendently demonstrating good safety awareness.    Time  12    Period  Weeks    Status  New            Plan - 07/09/18 1403    Clinical Impression Statement  Pt is progressing towards goals, however he remains limited by right hand and shoulder pain . Pt' pain improves with stretching and repositioning. Pt was started on gabapentin by MD.    Occupational performance deficits (Please refer to evaluation for details):  ADL's;IADL's;Work;Leisure;Social Participation    Body Structure / Function / Physical Skills  ADL;Dexterity;Flexibility;ROM;Strength;IADL;FMC;Coordination;Balance;UE functional use;GMC;Gait;Endurance;Decreased knowledge of precautions;Sensation    Rehab Potential  Good    OT Frequency  2x / week    OT Duration  8 weeks    OT Treatment/Interventions  Self-care/ADL training;Cryotherapy;Paraffin;Therapeutic exercise;DME and/or AE instruction;Functional Mobility Training;Balance training;Splinting;Manual Therapy;Neuromuscular education;Fluidtherapy;Ultrasound;Aquatic Therapy;Electrical Stimulation;Moist Heat;Contrast Bath;Energy conservation;Passive range of motion;Therapeutic activities;Patient/family education    Plan  start checking goals next week, NMR RUE/trunk,  closed chain to open chain reach,  continue to monitor hand pain    Consulted and Agree with Plan of Care  Patient;Family member/caregiver    Family Member Consulted  dtr who interprets for him       Patient will benefit from skilled therapeutic intervention in order to improve the following deficits and impairments:     Visit Diagnosis: Muscle weakness (generalized)  Hemiplegia and hemiparesis following cerebral infarction affecting right dominant side ([place])  Other lack of coordination  Abnormal posture    Problem List Patient Active Problem List   Diagnosis Date Noted  . Left pontine cerebrovascular accident ([place]) 05/11/2018  . Tobacco abuse   . Uncontrolled type 2 diabetes mellitus with hyperglycemia ([place])   . CVA (cerebral vascular accident) ([place]) 05/06/2018  . Hyperlipidemia 05/30/2017    RINE,KATHRYN 07/09/2018, 2:13 PM  [facility] [address] [place], [place], [address] Phone: [phone]   Fax:  [phone]  Name: Arthur Howard MRN: [id number] Date of Birth: 02-Aug-1965

## 2018-07-12 ENCOUNTER — Telehealth: Payer: Self-pay | Admitting: *Deleted

## 2018-07-12 MED ORDER — GABAPENTIN 300 MG PO CAPS: 300.0000 mg | ORAL_CAPSULE | Freq: Two times a day (BID) | ORAL | 1 refills | Status: DC

## 2018-07-12 MED FILL — GABAPENTIN 300 MG CAPSULE: 300 | 30 days supply | Qty: 60 | Fill #0

## 2018-07-12 NOTE — Telephone Encounter (Signed)
She want to go ahead and make th appt since he is not sleeping. I have given her to appts.

## 2018-07-12 NOTE — Telephone Encounter (Signed)
Arthur Howard daughter called and reports that his pain is no better. He unable to sleep due to the pain.  They were told to call if no better. Please advise.

## 2018-07-13 ENCOUNTER — Ambulatory Visit: Payer: Commercial Indemnity | Admitting: Occupational Therapy

## 2018-07-13 ENCOUNTER — Other Ambulatory Visit: Payer: Self-pay

## 2018-07-13 ENCOUNTER — Ambulatory Visit: Payer: Commercial Indemnity | Admitting: Physical Therapy

## 2018-07-13 DIAGNOSIS — R2689 Other abnormalities of gait and mobility: Secondary | ICD-10-CM

## 2018-07-13 DIAGNOSIS — I69351 Hemiplegia and hemiparesis following cerebral infarction affecting right dominant side: Secondary | ICD-10-CM

## 2018-07-13 DIAGNOSIS — R2681 Unsteadiness on feet: Secondary | ICD-10-CM

## 2018-07-13 DIAGNOSIS — M6281 Muscle weakness (generalized): Secondary | ICD-10-CM

## 2018-07-13 DIAGNOSIS — R293 Abnormal posture: Secondary | ICD-10-CM

## 2018-07-13 DIAGNOSIS — R278 Other lack of coordination: Secondary | ICD-10-CM

## 2018-07-13 DIAGNOSIS — I6350 Cerebral infarction due to unspecified occlusion or stenosis of unspecified cerebral artery: Secondary | ICD-10-CM | POA: Diagnosis not present

## 2018-07-13 NOTE — Therapy (Signed)
[facility] [address] [place], [place], [address] Phone: [phone]   Fax:  [phone]  Occupational Therapy Treatment  Patient Details  Name: Arthur Howard MRN: [id number] Date of Birth: 1965/05/24 No data recorded  Encounter Date: 07/13/2018  OT End of Session - 07/13/18 1445    Visit Number  13    Number of Visits  33    Date for OT Re-Evaluation  08/25/18    Authorization Type  [employer]    Authorization Time Period  12 weeks, 60 visit limit, counts as 1 visit if PT/OT    Authorization - Visit Number  12    Authorization - Number of Visits  62    OT Start Time  1238    OT Stop Time  1320    OT Time Calculation (min)  42 min    Activity Tolerance  Patient tolerated treatment well    Behavior During Therapy  WFL for tasks assessed/performed       Past Medical History:  Diagnosis Date  . Diabetes mellitus without complication ([place])   . Hyperlipidemia     Past Surgical History:  Procedure Laterality Date  . ANKLE SURGERY      There were no vitals filed for this visit.  Subjective Assessment - 07/13/18 1240    Subjective   sees MD Friday, tried additional meds that helps shoulder but not hand    Pertinent History  CVA, DM, hyperlipidemia    Limitations  non English speaking    Patient Stated Goals  recover use of RUE    Currently in Pain?  Yes    Pain Score  --   hand 5/10, shoulder 7-8/10   Pain Location  Shoulder    Pain Orientation  Right    Pain Descriptors / Indicators  Sharp    Pain Type  Acute pain    Pain Onset  More than a month ago    Pain Frequency  Intermittent    Aggravating Factors   worse at night, movement    Pain Relieving Factors  repositioning       Supine, gentle joint mobs to R shoulder, gentle stretching to fingers in flex/ext (composite, isolated MP, isolated IP flex), shoulder abduction/ER as tolerated with lower trunk rotation.  Pt reports incr sensitivity/pain in  digits 3-5 (particularly digit 5) but improved with stretching.  Supine, closed-chain shoulder flex, chest press, tricep ext, protraction/retraction with ball with BUEs with min cueing/facilitation.   Gentle wt. Bearing through R hand on mat with body on arm movements with min cueing/facilitation.  Began checking STGs and discussing progress--see STGs section.   OT Education - 07/13/18 1442    Education Details  avoid IR and elbow ext postures/repetitive movements for improved alignment and decr pain (including posture and positioning for washing hair); place and holds in finger flex, reviewed supine ball exercises and importance of positioning and using object that is correct size (pt educated in alternatives as current ball is too big and may contribute to pain/malpositioning)    Person(s) Educated  Patient;Child(ren)    Methods  Explanation;Demonstration;Verbal cues    Comprehension  Verbalized understanding;Returned demonstration;Verbal cues required       OT Short Term Goals - 07/13/18 1514      OT SHORT TERM GOAL #1   Title  I with inital HEP.    Time  6    Period  Weeks    Status  On-going    Target Date  07/11/18      OT SHORT TERM GOAL #2   Title  Pt will demonstrate  improved RUE functional use as evidenced by increasing box/ blocks score by 8 blocks.    Baseline  RUE 48, LUE 75    Time  6    Period  Weeks    Status  On-going      OT SHORT TERM GOAL #3   Title  Pt will demonstrate ability to retrieve a light weight object from overhead shelf at 120 shoulder flexion with no more than min compensations.    Time  6    Period  Weeks    Status  On-going   07/13/18:  95*     OT SHORT TERM GOAL #4   Title  Pt will perfrom all basic ADLS modified independently.    Time  6    Period  Weeks    Status  Achieved   07/13/18     OT SHORT TERM GOAL #5   Title  Pt will increase RUE grip strength to 35 lbs of greater for increased functional use.    Time  6    Period  Weeks     Status  On-going   07/13/18:  2lbs     OT SHORT TERM GOAL #6   Title  Pt will report using his RUE to assist with ADLs at least 50% of the time or greater.    Time  12    Period  Weeks    Status  Achieved   07/13/18: met per pt report       OT Long Term Goals - 05/27/18 1326      OT LONG TERM GOAL #1   Title  Pt will demonstrate ability to retrieve a 3 lbs weight from overhead shelf demonstrating good control with RUE.    Time  12    Period  Weeks    Status  New    Target Date  08/25/18      OT LONG TERM GOAL #2   Title  Pt will perform mod complex home managment modified independently.    Time  12    Period  Weeks    Status  New      OT LONG TERM GOAL #3   Title  Pt will demonstrate improved RUE fine motor coordination for ADLS as evidenced by completing 9 hole peg test in 38 secs or less.    Time  12    Period  Weeks    Status  New      OT LONG TERM GOAL #4   Title  Pt will report that he has returned to using his RUE as dominant hand at least 75% of the time for ADLs/IADLs..    Time  12    Period  Weeks    Status  New      OT LONG TERM GOAL #5   Title  Pt will demonstrate ability to write his name and address legibily using RUE.    Time  12    Period  Weeks    Status  New      Long Term Additional Goals   Additional Long Term Goals  Yes      OT LONG TERM GOAL #6   Title  Pt will perform simulated work activities modified indpendently demonstrating good safety awareness.    Time  12    Period  Weeks    Status  New  Plan - 07/13/18 1241    Clinical Impression Statement  Pt is progressing towards goals, however he remains limited by right hand (more on ulnar side) and shoulder pain. Pt pain improves with stretching and repositioning. Pt sees MD for shoulder injection Friday.  Pt pain improved with stretching and improved positioning (as pt tends to IR shoulder and have elbow flexed).    Occupational performance deficits (Please refer to  evaluation for details):  ADL's;IADL's;Work;Leisure;Social Participation    Body Structure / Function / Physical Skills  ADL;Dexterity;Flexibility;ROM;Strength;IADL;FMC;Coordination;Balance;UE functional use;GMC;Gait;Endurance;Decreased knowledge of precautions;Sensation    Rehab Potential  Good    OT Frequency  2x / week    OT Duration  8 weeks    OT Treatment/Interventions  Self-care/ADL training;Cryotherapy;Paraffin;Therapeutic exercise;DME and/or AE instruction;Functional Mobility Training;Balance training;Splinting;Manual Therapy;Neuromuscular education;Fluidtherapy;Ultrasound;Aquatic Therapy;Electrical Stimulation;Moist Heat;Contrast Bath;Energy conservation;Passive range of motion;Therapeutic activities;Patient/family education    Plan  continue checking goals, continue to monitor pain, neuro re-ed, ?splinting for spasticity/pain (anticipate shortened session next visit due to MD appt).    OT Home Exercise Plan  HEP -pt is currently perfroming closed chain shoulder flexion in supine, putty ex, and coordination activities    Consulted and Agree with Plan of Care  Patient;Family member/caregiver    Family Member Consulted  dtr who interprets for him       Patient will benefit from skilled therapeutic intervention in order to improve the following deficits and impairments:     Visit Diagnosis: Hemiplegia and hemiparesis following cerebral infarction affecting right dominant side ([facility])  Muscle weakness (generalized)  Other abnormalities of gait and mobility  Unsteadiness on feet  Other lack of coordination  Abnormal posture    Problem List Patient Active Problem List   Diagnosis Date Noted  . Left pontine cerebrovascular accident ([place]) 05/11/2018  . Tobacco abuse   . Uncontrolled type 2 diabetes mellitus with hyperglycemia ([place])   . CVA (cerebral vascular accident) ([place]) 05/06/2018  . Hyperlipidemia 05/30/2017    [facility] 07/13/2018, 3:24 PM  [facility] [address] [place], [place], [address] Phone: [phone]   Fax:  [phone]  Name: Arthur Howard MRN: [id number] Date of Birth: Mar 16, 1966   Vianne Bulls, OTR/L [facility] [address] [place], [place]  [address] [phone] phone [phone] 07/13/18 3:24 PM

## 2018-07-13 NOTE — Patient Instructions (Addendum)
Dorsiflexion: Resisted    De frente al punto de agarre de una banda elstica, con el otro extremo alrededor del pie izquierdo, lleve la punta del pie hacia usted. Repita _10-15___ veces por rutina. Realice __8__ Albertina [place] por sesin. Realice _2-4___ sesiones por da.  IN SEATED POSITION - HOLD BAND DOWN WITH LEFT FOOTStrengthening: Hip Flexion - Resisted    Con una banda elstica alrededor del tobillo izquierdo, de espaldas al punto de agarre de la banda. Lleve la pierna hacia adelante manteniendo la rodilla extendida. Repita _10-15___ veces por rutina. Realice __2__ Albertina [place] por sesin. Realice _3-5___ sesiones por da.  http://orth.exer.us/638   Copyright  VHI. All rights reserved.    Copyright  VHI. All rights reserved. http://orth.exer.us/688Strengthening: Hip Abduction - Resisted    Con una banda elstica alrededor de la pierna derecha, pierna opuesta hacia el punto de agarre de la banda, lleve la pierna hacia afuera del cuerpo. Repita _10-15___ veces por rutina. Realice __3__ Albertina [place] por sesin. Realice __[id number]: Hip Extension - Resisted    Con una banda elstica alrededor del tobillo derecho, de frente al punto de agarre de la banda, lleve la pierna extendida [place] atrs. Repita _10-15___ veces por rutina. Realice 2____ rutinas por sesin. Realice __9-5__ sesiones por da.  http://[email or web address].exer.us/636 __ sesiones por da.  http://orth.exer.us/634   Copyright  VHI. All rights reserved.

## 2018-07-13 NOTE — Therapy (Signed)
[facility] [address] [place], [place], [address] Phone: [phone]   Fax:  [phone]  Physical Therapy Treatment  Patient Details  Name: Arthur Howard MRN: [id number] Date of Birth: [birth year] Referring Provider (PT): Angiulli, Chapman Fitch (from hospital-f/u to be with Sherald Barge, NP)   Encounter Date: 07/13/2018  PT End of Session - 07/13/18 1958    Visit Number  13    Number of Visits  22    Date for PT Re-Evaluation  08/25/18    Authorization Type  [employer]-60 visits combined PT and OT if on same day    Authorization - Visit Number  13    Authorization - Number of Visits  60    PT Start Time  1320    PT Stop Time  1405    PT Time Calculation (min)  45 min    Activity Tolerance  Patient tolerated treatment well    Behavior During Therapy  [facility] for tasks assessed/performed       Past Medical History:  Diagnosis Date  . Diabetes mellitus without complication ([place])   . Hyperlipidemia     Past Surgical History:  Procedure Laterality Date  . ANKLE SURGERY      There were no vitals filed for this visit.  Subjective Assessment - 07/13/18 1935    Subjective  Pt states he forgot to wear his brace (AFO) today; pt says he has been walking for 20"-25"/day - states his walking is getting better    Patient is accompained by:  Family member    Patient Stated Goals  Pt's goal is to improve R hand, arm, leg strength.    Currently in Pain?  Yes    Pain Score  7    Rt hand 5/10    Pain Location  Shoulder    Pain Orientation  Right    Pain Descriptors / Indicators  Sharp    Pain Type  Acute pain    Pain Onset  More than a month ago    Pain Frequency  Intermittent                       [facility] Adult PT Treatment/Exercise - 07/13/18 1336      Ambulation/Gait   Ambulation/Gait  Yes    Ambulation/Gait Assistance  5: Supervision    Ambulation/Gait Assistance Details  without AFO (pt did not  wear AFO today)     Ambulation Distance (Feet)  115 Feet    Assistive device  None    Gait Pattern  Step-through pattern;Decreased arm swing - right;Decreased dorsiflexion - right    Ambulation Surface  Level;Indoor      Knee/Hip Exercises: Aerobic   Elliptical  level 1.5 x 1" forward;  level 1.0 45 secs backward      Knee/Hip Exercises: Machines for Strengthening   Cybex Leg Press  bil. LE's 70# 15 reps;  RLE only 50# 2 sets 10 reps with cues for eccentric control with return to flexed position       Knee/Hip Exercises: Standing   Heel Raises  Both;1 set;10 reps;Right;2 seconds   with UE support on handrail at steps   Hip Flexion  Stengthening;Right;2 sets;10 reps;Knee bent;Knee straight   1 set each knee position - with green theraband    Hip Abduction  Stengthening;Right;1 set;10 reps;Knee straight   with green theraband   Hip Extension  Stengthening;Right;1 set;10 reps;Knee straight   cues to stand erect  Forward Step Up  Right;1 set;Hand Hold: 0;Step Height: 6";10 reps    Step Down  Right;1 set;10 reps;Hand Hold: 1;Step Height: 6"      Ankle Exercises: Seated   Toe Raise  10 reps   RLE - with green theraband with 5 sec hold     Ankle Exercises: Standing   Other Standing Ankle Exercises  Pt performed amb. forward/backward on tiptoes inside // bars 2 reps; amb. sideways on tiptoes inside // bars without UE support 2 reps       Ankle Exercises: Plyometrics   Plyometric Exercises  pt performed 5 jumping jacks (LE movement only) inside // bars without UE support       Pt performed amb. 20' forwards, sideways, and backwards with green theraband around ankles for resistive amb. For RLE Strengthening - 1 rep each direction    Balance Exercises - 07/13/18 1950      Balance Exercises: Standing   Standing Eyes Opened  Wide (BOA);Head turns;Foam/compliant surface;5 reps   on blue balance beam - horizontal & vertical head turns    Standing Eyes Closed  Wide (BOA);Head  turns;Foam/compliant surface;5 reps   on blue balance beam inside // bars - no UE support   Rockerboard  Anterior/posterior;EO;Other reps (comment);Other (comment)   20 reps; no UE support    Partial Tandem Stance  Eyes open;Foam/compliant surface;1 rep   on blue balance beam - RLE behind LLE    Marching Limitations  forward march forwards/backwards 2 reps inside // bars     Other Standing Exercises  tap ups with LLE to 2nd step without UE support 10 reps        PT Education - 07/13/18 1956    Education Details  Added Rt hip flexion, abduction, and extension with green theraband to HEP:  also added seated dorsiflexion with green theraband (for RLE)    Person(s) Educated  Patient;Child(ren)    Methods  Explanation;Demonstration;Handout    Comprehension  Verbalized understanding;Returned demonstration       PT Short Term Goals - 06/23/18 1106      PT SHORT TERM GOAL #1   Title  Pt will be independent with HEP for improved transfers, balance, gait and strength.  TARGET 06/25/2018    Baseline  06/23/18: met with current program    Status  Achieved    Target Date  06/25/18      PT SHORT TERM GOAL #2   Title  Pt will improve 5x sit<>stand to less than or equal to 12.5 seconds for improved transfer efficiency and lower extremity strength.    Baseline  06/23/18: 9.90 sec's no UE support from standard height surface    Time  --    Period  --    Status  Achieved    Target Date  06/25/18      PT SHORT TERM GOAL #3   Title  Pt will improve TUG score to less than or equal to 13.5 sec for decreased fall risk.    Baseline  06/23/18: 7.13 sec's no device except for right AFO    Time  --    Period  --    Status  Achieved    Target Date  06/25/18      PT SHORT TERM GOAL #4   Title  Pt will improve DGI score to at least 17/24 for decreased fall risk.    Baseline  06/23/18: 19/24 scored today    Time  --    Period  --  Status  Achieved    Target Date  06/25/18      PT SHORT TERM GOAL #5    Title  Pt will negotiate 4 steps with alternating pattern, 1 rail modified independently, for preparation for return to work activities.    Baseline  06/23/18: met today    Time  --    Period  --    Status  Achieved    Target Date  06/25/18      PT SHORT TERM GOAL #6   Title  Pt will verbalize understanding of fall prevention in home environment.    Baseline  06/23/18: met today    Time  --    Period  --    Status  Achieved    Target Date  06/25/18        PT Long Term Goals - 05/27/18 1221      PT LONG TERM GOAL #1   Title  Pt will be independent with progressive HEP for strength, balance, and gait.  TARGET 07/23/2018    Time  9    Period  Weeks    Status  New    Target Date  07/23/18      PT LONG TERM GOAL #2   Title  Pt will perform 8 of 10 reps from surfaces <18" with no UE support, for improved transfer efficeincy and improved lower extremity strength.    Time  9    Period  Weeks    Status  New    Target Date  07/23/18      PT LONG TERM GOAL #3   Title  Pt will improve DGI score to at least 19/24 for decreased fall risk.    Time  9    Period  Weeks    Status  New    Target Date  07/23/18      PT LONG TERM GOAL #4   Title  Pt will ambulate at least 1000 ft, independently, no LOB, indoor and outdoor surfaces, for improved community gait.    Time  9    Period  Weeks    Status  New    Target Date  07/23/18      PT LONG TERM GOAL #5   Title  Pt will perform floor>stand transfer independently, for safe fall recovery/floor activities/return to work.    Time  9    Period  Weeks    Status  New    Target Date  07/23/18            Plan - 07/13/18 1959    Clinical Impression Statement  Pt improving with gait as pt amb. without use of AFO today (states he forgot it) with minimal gait deviations at beginning of PT session, prior to fatigue;  increased deviations noted during session with fatigue, due to decreased muscle endurance.  Deviations included decreased  eccentric dorsiflexion in stance with mild foot slap noted and decreased hip and knee flexion with fatigue also noted.  Pt reported mild to mod. fatigue in RLE at end of session, but stated he felt good overall.  Pt states he feels he is getting stronger and gait is improving.  Floor to stand transfer was not performed today due to pt's c/o pain in Rt shoulder and hand.  Comorbidities  hyperlipidemia, DM    Rehab Potential  Good    PT Frequency  2x / week    PT Duration  8 weeks    PT Treatment/Interventions  ADLs/Self Care Home Management;Electrical Stimulation;DME Instruction;Gait training;Stair training;Functional mobility training;Therapeutic activities;Therapeutic exercise;Orthotic Fit/Training;Patient/family education;Neuromuscular re-education;Balance training;Manual techniques    PT Next Visit Plan  continue use of Bioness on R lower leg.  higher level balance simulating work tasks.  Floor transfers.    PT Home Exercise Plan  Access Code: [email or web address] , plus gastroc stretch    Consulted and Agree with Plan of Care  Patient;Family member/caregiver    Family Member Consulted  Daughter       Patient will benefit from skilled therapeutic intervention in order to improve the following deficits and impairments:  Abnormal gait, Decreased balance, Decreased mobility, Difficulty walking, Decreased strength, Impaired flexibility, Impaired tone, Postural dysfunction  Visit Diagnosis: Other abnormalities of gait and mobility  Unsteadiness on feet  Muscle weakness (generalized)     Problem List Patient Active Problem List   Diagnosis Date Noted  . Left pontine cerebrovascular accident ([place]) 05/11/2018  . Tobacco abuse   . Uncontrolled type 2 diabetes mellitus with hyperglycemia ([place])   . CVA (cerebral vascular accident) ([place]) 05/06/2018  . Hyperlipidemia 05/30/2017     Alda Lea, PT 07/13/2018, 8:09 PM  [facility] [address] [place], [place], [address] Phone: [phone]   Fax:  [phone]  Name: Arthur Howard MRN: [id number] Date of Birth: 05/10/1965

## 2018-07-14 ENCOUNTER — Ambulatory Visit: Payer: Commercial Indemnity | Admitting: Occupational Therapy

## 2018-07-16 ENCOUNTER — Ambulatory Visit: Payer: Commercial Indemnity | Admitting: Physical Therapy

## 2018-07-16 ENCOUNTER — Ambulatory Visit: Payer: Commercial Indemnity | Admitting: Occupational Therapy

## 2018-07-16 ENCOUNTER — Other Ambulatory Visit: Payer: Self-pay | Admitting: Internal Medicine

## 2018-07-16 ENCOUNTER — Encounter: Payer: Self-pay | Admitting: Physical Therapy

## 2018-07-16 ENCOUNTER — Other Ambulatory Visit: Payer: Self-pay

## 2018-07-16 ENCOUNTER — Other Ambulatory Visit: Payer: Self-pay | Admitting: *Deleted

## 2018-07-16 ENCOUNTER — Telehealth: Payer: Self-pay | Admitting: *Deleted

## 2018-07-16 ENCOUNTER — Ambulatory Visit (HOSPITAL_BASED_OUTPATIENT_CLINIC_OR_DEPARTMENT_OTHER): Payer: Commercial Indemnity | Admitting: Physical Medicine & Rehabilitation

## 2018-07-16 VITALS — BP 153/78 | HR 63 | Temp 97.7°F | Ht 65.0 in | Wt 155.8 lb

## 2018-07-16 VITALS — BP 122/78 | HR 63 | Temp 97.7°F | Ht 65.0 in | Wt 155.8 lb

## 2018-07-16 DIAGNOSIS — R2689 Other abnormalities of gait and mobility: Secondary | ICD-10-CM

## 2018-07-16 DIAGNOSIS — M8909 Algoneurodystrophy, multiple sites: Secondary | ICD-10-CM | POA: Diagnosis not present

## 2018-07-16 DIAGNOSIS — M6281 Muscle weakness (generalized): Secondary | ICD-10-CM

## 2018-07-16 DIAGNOSIS — I6350 Cerebral infarction due to unspecified occlusion or stenosis of unspecified cerebral artery: Secondary | ICD-10-CM | POA: Diagnosis not present

## 2018-07-16 DIAGNOSIS — R2681 Unsteadiness on feet: Secondary | ICD-10-CM

## 2018-07-16 DIAGNOSIS — I69351 Hemiplegia and hemiparesis following cerebral infarction affecting right dominant side: Secondary | ICD-10-CM

## 2018-07-16 DIAGNOSIS — I639 Cerebral infarction, unspecified: Secondary | ICD-10-CM

## 2018-07-16 MED ORDER — PREDNISONE 10 MG PO TABS: ORAL_TABLET | ORAL | 0 refills | Status: DC

## 2018-07-16 MED ORDER — PREDNISONE 10 MG (21) PO TBPK: ORAL_TABLET | Freq: Every day | ORAL | 1 refills | Status: DC

## 2018-07-16 MED FILL — predniSONE 10 MG TABS: 10 | 12 days supply | Qty: 30 | Fill #0

## 2018-07-16 NOTE — Telephone Encounter (Signed)
error 

## 2018-07-16 NOTE — Progress Notes (Addendum)
Subjective:    Patient ID: Arthur Howard, male    DOB: 07-13-1965, 53 y.o.   MRN: [id number]  HPI  53 year old male with history of tobacco abuse and diabetes who developed right-sided weakness and facial droop on 04/26/2018. 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  He was seen by neurology placed on Aspirin and Plavix for 3 weeks then aspirin alone. He completed an inpatient rehabilitation program and then was transferred to an outpatient rehabilitation program.  He is currently receiving outpatient PT and OT  3 wk hx of Right shoulder and hand pain.  No falls or trauma.  No prior hx of shoulder pain No trauma Hypersensitivity of RIght hand and swelling.  Also feels warm at some times.  No prior history of this before the stroke. Has some neck pain but does not feel like pain shoots down from the neck toward his hand or arm.  Pain Inventory Average Pain 7 Pain Right Now 6 My pain is intermittent, sharp and aching  In the last 24 hours, has pain interfered with the following? General activity 6 Relation with others 6 Enjoyment of life 6 What TIME of day is your pain at its worst? morning and night Sleep (in general) Fair  Pain is worse with: inactivity Pain improves with: medication Relief from Meds: 2  Mobility how many minutes can you walk? 15 ability to climb steps?  yes do you drive?  no  Function disabled: date disabled na I need assistance with the following:  shopping  Neuro/Psych weakness numbness  Prior Studies Any changes since last visit?  no  Physicians involved in your care Any changes since last visit?  no   Family History  Problem Relation Age of Onset  . Diabetes Sister   . Diabetes Brother   . CVA Neg Hx    Social History   Socioeconomic History  . Marital status: Married    Spouse name: Not on file  . Number of children: Not on file  . Years of education: Not on file  . Highest education level: Not on file   Occupational History  . Occupation: Sales executive    Comment: [employer]  Social Needs  . Financial resource strain: Not on file  . Food insecurity:    Worry: Not on file    Inability: Not on file  . Transportation needs:    Medical: Not on file    Non-medical: Not on file  Tobacco Use  . Smoking status: Former Research scientist (life sciences)  . Smokeless tobacco: Never Used  . Tobacco comment: never a daily smoker  Substance and Sexual Activity  . Alcohol use: No    Alcohol/week: 0.0 standard drinks  . Drug use: No  . Sexual activity: Not on file  Lifestyle  . Physical activity:    Days per week: Not on file    Minutes per session: Not on file  . Stress: Not on file  Relationships  . Social connections:    Talks on phone: Not on file    Gets together: Not on file    Attends religious service: Not on file    Active member of club or organization: Not on file    Attends meetings of clubs or organizations: Not on file    Relationship status: Not on file  Other Topics Concern  . Not on file  Social History Narrative   Works with Sales executive. Lives in [place]. Moved from [place] 35 years ago. He is married. Has 7 children (all  girls).    Past Surgical History:  Procedure Laterality Date  . ANKLE SURGERY     Past Medical History:  Diagnosis Date  . Diabetes mellitus without complication ([place])   . Hyperlipidemia    BP (!) 153/78   Pulse 63   Temp 97.7 F (36.5 C)   Ht 5\' 5"  (1.651 m)   Wt 155 lb 12.8 oz (70.7 kg)   SpO2 97%   BMI 25.93 kg/m   Opioid Risk Score:   Fall Risk Score:  `1  Depression screen PHQ 2/9  Depression screen [facility] 2/9 07/05/2018 05/26/2018 05/30/2017 09/20/2016 09/18/2016 03/04/2016 11/10/2015  Decreased Interest 0 0 0 0 0 0 0  Down, Depressed, Hopeless 0 0 0 0 0 0 0  PHQ - 2 Score 0 0 0 0 0 0 0    Review of Systems  Constitutional: Negative.   HENT: Negative.   Eyes: Negative.   Respiratory: Negative.   Cardiovascular: Negative.   Gastrointestinal: Negative.    Endocrine: Negative.   Genitourinary: Negative.   Musculoskeletal: Positive for myalgias.       Shoulder pain   Skin: Negative.   Allergic/Immunologic: Negative.   Neurological: Positive for weakness and numbness.  Hematological: Negative.   Psychiatric/Behavioral: Negative.   All other systems reviewed and are negative.      Objective:   Physical Exam Vitals signs and nursing note reviewed.  Constitutional:      General: He is not in acute distress.    Appearance: Normal appearance. He is normal weight.  HENT:     Head: Normocephalic and atraumatic.     Nose: Nose normal.     Mouth/Throat:     Mouth: Mucous membranes are moist.  Eyes:     Extraocular Movements: Extraocular movements intact.     Pupils: Pupils are equal, round, and reactive to light.  Neck:     Musculoskeletal: Normal range of motion and neck supple. No neck rigidity or muscular tenderness.  Musculoskeletal:        General: Swelling present.     Right shoulder: He exhibits decreased range of motion and tenderness.     Comments: There is swelling of the right dorsum of the hand Patient has approximately 50% range at the PIPs and DIPs of the right hand. Patient has pain with passive range of motion and range.  There is also hypersensitivity to touch in the forearm region primarily on the ulnar aspect.  Patient has hypersensitivity on the third fourth and fifth digits primarily No evidence of joint effusions in the hand or wrist area.  Patient with mildly diminished supination pronation of the right hand Right shoulder has pain with impingement testing as well as pain with external rotation at approximately 20 degrees. There is tenderness in the subacromial area.   Skin:    General: Skin is warm and dry.     Findings: No erythema or rash.     Comments: No evidence of hyperhidrosis or hyperhidrosis in the right hip  Neurological:     Mental Status: He is alert.           Assessment & Plan:  1.   Right shoulder hand syndrome patient with hypersensitivity as well as distal swelling.  This is a complex regional pain syndrome variant seen primarily after CVA.  He is getting some minimal relief from the gabapentin.  Discussed treatment options.  Would recommend prednisone burst and taper starting at 40 mg/day x 3 days then 30 mg/day x  3 days then 20 mg/day x 3 days and then 10 mg/day x 3 days.  We discussed likely elevation of blood sugars especially during the higher dose phase.  In addition we discussed the possibility of insomnia as well as abdominal discomfort.  He may need to take this with food. 2.  Right shoulder adhesive capsulitis.  Pain is inhibiting motion will do glenohumeral injection today continue aggressive OT PT Shoulder injection Right glenohumeral Palpation guided  Indication:RIght adhesive capsulitis with Shoulder pain not relieved by medication management and other conservative care.  Informed consent was obtained after describing risks and benefits of the procedure with the patient, this includes bleeding, bruising, infection and medication side effects. The patient wishes to proceed and has given written consent. Patient was placed in a seated position. The RIght shoulder was marked and prepped with betadine in the subacromial area. A 25-gauge 1-1/2 inch needle was inserted into the subacromial area. After negative draw back for blood, a solution containing 1 mL of 6 mg per ML betamethasone and 4 mL of 1% lidocaine was injected. A band aid was applied. The patient tolerated the procedure well. Post procedure instructions were given. Return to clinic 1 month.  May need to reinject shoulder

## 2018-07-16 NOTE — Therapy (Signed)
[facility] [address] [place], [place], [address] Phone: [phone]   Fax:  [phone]  Physical Therapy Treatment  Patient Details  Name: Arthur Howard MRN: [id number] Date of Birth: Feb 16, 1966 Referring Provider (PT): Angiulli, Chapman Fitch (from hospital-f/u to be with Sherald Barge, NP)   Encounter Date: 07/16/2018  PT End of Session - 07/16/18 1750    Visit Number  14    Number of Visits  22    Date for PT Re-Evaluation  08/25/18    Authorization Type  [employer]-60 visits combined PT and OT if on same day    Authorization - Visit Number  14    Authorization - Number of Visits  60    PT Start Time  1104    PT Stop Time  1146    PT Time Calculation (min)  42 min    Activity Tolerance  Patient tolerated treatment well    Behavior During Therapy  [facility] for tasks assessed/performed       Past Medical History:  Diagnosis Date  . Diabetes mellitus without complication ([place])   . Hyperlipidemia     Past Surgical History:  Procedure Laterality Date  . ANKLE SURGERY      There were no vitals filed for this visit.  Subjective Assessment - 07/16/18 1100    Subjective  I worked out hard the other day with PT.    Patient is accompained by:  Family member    Patient Stated Goals  Pt's goal is to improve R hand, arm, leg strength.    Currently in Pain?  Yes    Pain Score  7     Pain Location  Arm   and hand   Pain Orientation  Right    Pain Descriptors / Indicators  Sharp    Pain Type  Acute pain    Pain Onset  More than a month ago    Pain Frequency  Intermittent    Aggravating Factors   worse at night, movement    Pain Relieving Factors  repositioning                       [facility] Adult PT Treatment/Exercise - 07/16/18 0001      Ambulation/Gait   Ambulation/Gait  Yes    Ambulation/Gait Assistance  5: Supervision    Ambulation/Gait Assistance Details  gait and balance, strengthening  exercises performed today without AFO    Ambulation Distance (Feet)  115 Feet   345   Assistive device  None    Gait Pattern  Step-through pattern;Decreased arm swing - right;Decreased dorsiflexion - right    Ambulation Surface  Level;Indoor    Gait Comments  Provided tactile cues for relaxed shoulders and relaxed arm swing.  Pt reports 5/10 pain (reduction from 7/10 pain) with gait at end of session.  Gait with head turns x 115 ft, then gait with head nods x 115 ft; sidestepping R and L x 20 ft.      Knee/Hip Exercises: Standing   Heel Raises  Both;10 reps;Right;2 seconds;2 sets   then RLE only x 10 reps   Hip Flexion  Stengthening;Right;Left;1 set;10 reps;Knee straight   green theraband   Hip Abduction  Stengthening;Right;1 set;10 reps;Knee straight   green theraband   Hip Extension  Stengthening;Right;1 set;10 reps;Knee straight   green theraband   Forward Step Up  Right;1 set;Hand Hold: 0;Step Height: 6";10 reps    Forward Step Up Limitations  2nd set:  step up/up, down/down x 10 reps each leg leading    Step Down  Right;1 set;10 reps;Hand Hold: 1;Step Height: 6"      Ankle Exercises: Standing   Other Standing Ankle Exercises  Pt performed amb. forward/backward on tiptoes inside parallel bars 3 reps; squat walking forward with ankle dorsiflexion in parallel bars 4 reps          Balance Exercises - 07/16/18 1745      Balance Exercises: Standing   Rockerboard  Anterior/posterior;EO;Other reps (comment);Other (comment)   20 reps; hip/ankle strategy, head turns/head nods   Other Standing Exercises  On foam surface:  marching in place x 20 reps, then forward step taps, then forward kicks x 10 reps alternating legs.  Side step taps on foam to ground, alternating legs x 10 reps, then sidestep onto/off of foam R and L, 5 reps each direction.  On rockerboard, forward alternating step taps x 10 reps.  Occasional cues for control RLE          PT Short Term Goals - 06/23/18 1106       PT SHORT TERM GOAL #1   Title  Pt will be independent with HEP for improved transfers, balance, gait and strength.  TARGET 06/25/2018    Baseline  06/23/18: met with current program    Status  Achieved    Target Date  06/25/18      PT SHORT TERM GOAL #2   Title  Pt will improve 5x sit<>stand to less than or equal to 12.5 seconds for improved transfer efficiency and lower extremity strength.    Baseline  06/23/18: 9.90 sec's no UE support from standard height surface    Time  --    Period  --    Status  Achieved    Target Date  06/25/18      PT SHORT TERM GOAL #3   Title  Pt will improve TUG score to less than or equal to 13.5 sec for decreased fall risk.    Baseline  06/23/18: 7.13 sec's no device except for right AFO    Time  --    Period  --    Status  Achieved    Target Date  06/25/18      PT SHORT TERM GOAL #4   Title  Pt will improve DGI score to at least 17/24 for decreased fall risk.    Baseline  06/23/18: 19/24 scored today    Time  --    Period  --    Status  Achieved    Target Date  06/25/18      PT SHORT TERM GOAL #5   Title  Pt will negotiate 4 steps with alternating pattern, 1 rail modified independently, for preparation for return to work activities.    Baseline  06/23/18: met today    Time  --    Period  --    Status  Achieved    Target Date  06/25/18      PT SHORT TERM GOAL #6   Title  Pt will verbalize understanding of fall prevention in home environment.    Baseline  06/23/18: met today    Time  --    Period  --    Status  Achieved    Target Date  06/25/18        PT Long Term Goals - 05/27/18 1221      PT LONG TERM GOAL #1   Title  Pt will be  independent with progressive HEP for strength, balance, and gait.  TARGET 07/23/2018    Time  9    Period  Weeks    Status  New    Target Date  07/23/18      PT LONG TERM GOAL #2   Title  Pt will perform 8 of 10 reps from surfaces <18" with no UE support, for improved transfer efficeincy and improved lower  extremity strength.    Time  9    Period  Weeks    Status  New    Target Date  07/23/18      PT LONG TERM GOAL #3   Title  Pt will improve DGI score to at least 19/24 for decreased fall risk.    Time  9    Period  Weeks    Status  New    Target Date  07/23/18      PT LONG TERM GOAL #4   Title  Pt will ambulate at least 1000 ft, independently, no LOB, indoor and outdoor surfaces, for improved community gait.    Time  9    Period  Weeks    Status  New    Target Date  07/23/18      PT LONG TERM GOAL #5   Title  Pt will perform floor>stand transfer independently, for safe fall recovery/floor activities/return to work.    Time  9    Period  Weeks    Status  New    Target Date  07/23/18            Plan - 07/16/18 1751    Clinical Impression Statement  Continued to work most of the session without AFO, with focus on hip, ankle strengthening, balance work on compliant surfaces, and dynamic gait at end of PT session.  At end of session with gait, could tell muscle fatigue with foot slap, decreased heelstrike (pt able to briefly correct with cues).  Pt continues to beneift from skilled PT for strengthening, balance, and return to independent gait and mobility.    Comorbidities  hyperlipidemia, DM    Rehab Potential  Good    PT Frequency  2x / week    PT Duration  8 weeks    PT Treatment/Interventions  ADLs/Self Care Home Management;Electrical Stimulation;DME Instruction;Gait training;Stair training;Functional mobility training;Therapeutic activities;Therapeutic exercise;Orthotic Fit/Training;Patient/family education;Neuromuscular re-education;Balance training;Manual techniques    PT Next Visit Plan  continue use of Bioness on R lower leg as able or functional strengthening/gait without AFO;  higher level balance simulating work tasks.  Floor transfer training; wk of 07/19/2018 is wk 7 in [place] (begin checking LTGS)-discuss renewal    PT Home Exercise Plan  Access Code: [email or web address] , plus  gastroc stretch    Consulted and Agree with Plan of Care  Patient;Family member/caregiver    Family Member Consulted  Daughter       Patient will benefit from skilled therapeutic intervention in order to improve the following deficits and impairments:  Abnormal gait, Decreased balance, Decreased mobility, Difficulty walking, Decreased strength, Impaired flexibility, Impaired tone, Postural dysfunction  Visit Diagnosis: Muscle weakness (generalized)  Unsteadiness on feet  Other abnormalities of gait and mobility     Problem List Patient Active Problem List   Diagnosis Date Noted  . Left pontine cerebrovascular accident ([place]) 05/11/2018  . Tobacco abuse   . Uncontrolled type 2 diabetes mellitus with hyperglycemia ([place])   . CVA (cerebral vascular accident) ([place]) 05/06/2018  . Hyperlipidemia 05/30/2017    Koula Venier  W. 07/16/2018, 6:06 PM  Frazier Butt., PT   [facility] [address] [place], [place], [address] Phone: [phone]   Fax:  [phone]  Name: Arthur Howard MRN: [id number] Date of Birth: 1965/09/18

## 2018-07-16 NOTE — Telephone Encounter (Signed)
Previous order did not transmit to pharmacy. After consulting with Dr Letta Pate, new order placed for 10 mg prednisone taper dosing with custom schedule.  Called to [employer].

## 2018-07-16 NOTE — Telephone Encounter (Signed)
Requested medication (s) are due for refill today: yes  Requested medication (s) are on the active medication list: yes  Last refill:  05/21/18 #30 with 1 refill by a different provider  Future visit scheduled: yes  Notes to clinic: Unable to refill per protocol. Last refilled by Ginger Carne    Requested Prescriptions  Pending Prescriptions Disp Refills   atorvastatin (LIPITOR) 80 MG tablet 30 tablet 1    Sig: Take 1 tablet (80 mg total) by mouth daily.     Cardiovascular:  Antilipid - Statins Failed - 07/16/2018  3:55 PM      Failed - Total Cholesterol in normal range and within 360 days    Cholesterol, Total  Date Value Ref Range Status  05/30/2017 217 (H) 100 - 199 mg/dL Final   Cholesterol  Date Value Ref Range Status  05/07/2018 211 (H) 0 - 200 mg/dL Final         Failed - LDL in normal range and within 360 days    LDL Calculated  Date Value Ref Range Status  05/30/2017 112 (H) 0 - 99 mg/dL Final   LDL Cholesterol  Date Value Ref Range Status  05/07/2018 133 (H) 0 - 99 mg/dL Final    Comment:           Total Cholesterol/HDL:CHD Risk Coronary Heart Disease Risk Table                     Men   Women  1/2 Average Risk   3.4   3.3  Average Risk       5.0   4.4  2 X Average Risk   9.6   7.1  3 X Average Risk  23.4   11.0        Use the calculated Patient Ratio above and the CHD Risk Table to determine the patient's CHD Risk.        ATP III CLASSIFICATION (LDL):  <100     mg/dL   Optimal  100-129  mg/dL   Near or Above                    Optimal  130-159  mg/dL   Borderline  160-189  mg/dL   High  >190     mg/dL   Very High Performed at [facility] [address] [place], [place] [address]          Failed - HDL in normal range and within 360 days    HDL  Date Value Ref Range Status  05/07/2018 34 (L) >40 mg/dL Final  05/30/2017 47 >39 mg/dL Final         Failed - Triglycerides in normal range and within 360 days    Triglycerides   Date Value Ref Range Status  05/07/2018 220 (H) <150 mg/dL Final         Passed - Patient is not pregnant      Passed - Valid encounter within last 12 months    Recent Outpatient Visits          1 month ago Screening for malignant neoplasm of colon   Therapist, music at [facility], Rayford Halsted, MD   1 year ago Type 2 diabetes mellitus with hyperglycemia, without long-term current use of insulin [facility])   [facility] at [facility], Lilia Argue, MD   1 year ago Annual physical exam   Primary Care at [facility], [place] D, [place]  1 year ago Rash and nonspecific skin eruption   Primary Care at [facility], [place] D, PA-C   2 years ago Herpes zoster without complication   Primary Care at [facility], Arlie Solomons, MD      Future Appointments            In 2 weeks Venancio Poisson, NP [facility]   In 1 month Isaac Bliss, Rayford Halsted, MD [place] [facility] [place], [place]          glipiZIDE (GLUCOTROL) 10 MG tablet 60 tablet 1    Sig: Take 1 tablet (10 mg total) by mouth 2 (two) times daily before a meal.     Endocrinology:  Diabetes - Sulfonylureas Failed - 07/16/2018  3:55 PM      Failed - HBA1C is between 0 and 7.9 and within 180 days    Hgb A1c MFr Bld  Date Value Ref Range Status  05/06/2018 11.3 (H) 4.8 - 5.6 % Final    Comment:    (NOTE)         Prediabetes: 5.7 - 6.4         Diabetes: >6.4         Glycemic control for adults with diabetes: <7.0          Passed - Valid encounter within last 6 months    Recent Outpatient Visits          1 month ago Screening for malignant neoplasm of colon   [place] [facility], Rayford Halsted, MD   1 year ago Type 2 diabetes mellitus with hyperglycemia, without long-term current use of insulin [facility])   [facility] at [facility], Lilia Argue, MD   1 year ago Annual physical exam   Primary Care at [place], [place] D, PA-C   1 year ago Rash and  nonspecific skin eruption   Primary Care at [place], [place] D, PA-C   2 years ago Herpes zoster without complication   Primary Care at [facility], Arlie Solomons, MD      Future Appointments            In 2 weeks Venancio Poisson, NP [facility]   In 1 month Isaac Bliss, Rayford Halsted, MD [employer] at [place], [facility]

## 2018-07-16 NOTE — Therapy (Signed)
[facility] [address] [place], [place], [address] Phone: [phone]   Fax:  [phone]  Patient Details  Name: Arthur Howard MRN: [id number] Date of Birth: 19-Jan-1966 Referring Provider:  Isaac Bliss, [place]*  Encounter Date: 07/16/2018 Pt had to leave to attend a conflicting MD appt. No charge.  Cole Klugh 07/16/2018, 3:27 PM  [facility] [address] [place], [place], [address] Phone: [phone]   Fax:  [phone]

## 2018-07-16 NOTE — Patient Instructions (Addendum)
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 Generalmente ocurre en [employer], un brazo, un pie o una pierna. El SDRC generalmente ocurre despus de una lesin o un traumatismo, como una fractura o un esguince. Hay dos tipos de SDRC:  Tipo1. Este tipo ocurre despus de una lesin sin que se produzca un dao conocido a un nervio.  Tipo2. Este tipo ocurre a causa de un dao a un nervio despus de una lesin. La afeccin tiene tres fases:  Fase 1. Esta fase, denominada fase aguda, puede durar hasta 3 meses.  Fase 2. Esta fase, denominada fase distrfica, puede durar de 3 a 12 meses.  Fase 3. Esta fase, denominada fase atrfica, puede comenzar despus de un ao. El SDRC vara de leve a grave. En la [employer], [place] es leve y la recuperacin se produce con el [place]. En otras, el SDRC dura [employer], y Careers adviser la realizacin de las tareas cotidianas. Cules son las causas? Se desconoce la causa exacta de esta afeccin. Generalmente el desencadenante es una lesin. Qu incrementa el riesgo? Es ms probable que usted sufra esta afeccin si:  Es mujer.  Tiene alguna de las siguientes lesiones: ? Doctor, hospital de la mueca que abarca la parte inferior del hueso del brazo (fractura distal del radio). ? Una luxacin o fractura del tobillo. ? Una ciruga que dura [employer]. ? Una posible lesin en los nervios durante [place]. Cules son los signos o los sntomas? Los signos y sntomas en la mano, brazo, pie o pierna afectados son diferentes en cada fase. Los signos y sntomas de la fase 1 incluyen los siguientes:  Dolor urente.  Picazn, sensacin  de hormigueo (sensacin de pinchazos).  Piel extremadamente sensible.  Hinchazn.  Rigidez articular.  Enrojecimiento y Freight forwarder.  Sudoracin excesiva.  El crecimiento del cabello y las uas es ms rpido que lo normal. Los signos y sntomas de la fase 2 incluyen los siguientes:  Propagacin del Social research officer, government a todo el brazo o la pierna.  Mayor sensibilidad de la piel.  Aumento de la hinchazn y la rigidez.  Piel fra.  Pigmentacin azulada de la piel.  Prdida de arrugas de la piel.  Uas quebradizas. Los signos y sntomas de la fase 3 incluyen los siguientes:  El dolor se propaga a otras reas del cuerpo pero se torna menos intenso.  Mayor rigidez, que causa la prdida de Force.  Piel plida, seca, brillante o tirante. Cmo se diagnostica? Esta afeccin se puede diagnosticar en funcin de lo siguiente:  Sus signos y sntomas.  Un examen fsico. No existe ninguna prueba para diagnosticar el SDRC, pero es posible que le realicen estudios:  Para Transport planner en los huesos que podran indicar el SDRC. Estos estudios pueden incluir una resonancia magntica (RM) o una gammagrafa sea.  Estudios para Freight forwarder posibles causas de los sntomas. Cmo se trata? El tratamiento temprano puede evitar que el SDRC supere la fase 1. No hay un solo tratamiento que funcione para todos. Las opciones de tratamiento son las siguientes:  Medicamentos, que incluyen los siguientes: ? Antiinflamatorios no esteroides (AINE), como el ibuprofeno. ? Corticoesteroides. ? Medicamentos para la presin arterial. ? Antidepresivos. ?  Anticonvulsivos. ? Analgsicos.  Actividad fsica.  Terapia ocupacional (TO) y fisioterapia (FT).  Biorretroalimentacin.  Asesoramiento psicolgico.  Inyecciones de anestsicos.  Ciruga espinal para implantar un estimulador de mdula espinal o una bomba para Best boy. Siga estas indicaciones en su casa: Medicamentos  [employer] de venta libre y los recetados solamente como se lo haya indicado el mdico.  No conduzca ni use maquinaria pesada mientras toma analgsicos recetados.  Si toma analgsicos recetados, tome medidas para prevenir o tratar el estreimiento. El mdico puede recomendarle que: ? Beba suficiente lquido para mantener la orina de color amarillo plido. ? Consuma alimentos ricos en fibra, como frutas y verduras frescas, cereales integrales y frijoles. ? Limite el consumo de alimentos ricos en grasa y azcares procesados, como los alimentos fritos o dulces. ? Tome medicamentos recetados o de venta libre para el estreimiento. Instrucciones generales  No consuma ningn producto que contenga nicotina o tabaco, como cigarrillos y Psychologist, sport and exercise. Si necesita ayuda para dejar de fumar, consulte al mdico.  Mantenga un peso saludable.  Retome sus actividades normales como se lo haya indicado el mdico. Pregntele al mdico qu actividades son seguras para usted.  Siga un programa de ejercicios como se lo haya indicado el mdico.  Concurra a todas las visitas de seguimiento como se lo haya indicado el mdico. Esto es importante. Comunquese con un mdico si:  Los sntomas [place].  Los sntomas empeoran.  Desarrolla ansiedad o depresin. Resumen  El sndrome de dolor regional complejo [facility]) es un trastorno nervioso que causa dolor a largo plazo (crnico), generalmente en [place], un brazo, una pierna o un pie.  El SDRC generalmente ocurre despus de una lesin o un traumatismo, como una fractura o un esguince.  El SDRC vara de leve a grave. El tratamiento temprano puede evitar que el SDRC progrese a fases ms graves. Esta informacin no tiene Marine scientist el consejo del mdico. Asegrese de hacerle al mdico cualquier pregunta que tenga. Document Released: 06/06/2008 Document Revised: 04/03/2017 Document Reviewed: 04/03/2017 Elsevier Interactive Patient Education   2019 [employer].

## 2018-07-19 ENCOUNTER — Other Ambulatory Visit: Payer: Self-pay | Admitting: *Deleted

## 2018-07-19 MED ORDER — ATORVASTATIN CALCIUM 80 MG PO TABS: 80.0000 mg | ORAL_TABLET | Freq: Every day | ORAL | 0 refills | Status: DC

## 2018-07-19 MED ORDER — GABAPENTIN 300 MG PO CAPS: 300.0000 mg | ORAL_CAPSULE | Freq: Two times a day (BID) | ORAL | 1 refills | Status: DC

## 2018-07-19 MED ORDER — GLIPIZIDE 10 MG PO TABS: 10.0000 mg | ORAL_TABLET | Freq: Two times a day (BID) | ORAL | 0 refills | Status: DC

## 2018-07-19 MED FILL — glipiZIDE 10 MG TABS: 10 | 90 days supply | Qty: 180 | Fill #0

## 2018-07-19 MED FILL — ATORVASTATIN 80 MG TABLET: 80 | 90 days supply | Qty: 90 | Fill #0

## 2018-07-21 ENCOUNTER — Other Ambulatory Visit: Payer: Self-pay

## 2018-07-21 ENCOUNTER — Ambulatory Visit: Payer: Commercial Indemnity | Admitting: Physical Therapy

## 2018-07-21 ENCOUNTER — Encounter: Payer: Self-pay | Admitting: Physical Therapy

## 2018-07-21 ENCOUNTER — Ambulatory Visit: Payer: Commercial Indemnity | Admitting: Occupational Therapy

## 2018-07-21 DIAGNOSIS — M6281 Muscle weakness (generalized): Secondary | ICD-10-CM

## 2018-07-21 DIAGNOSIS — R278 Other lack of coordination: Secondary | ICD-10-CM

## 2018-07-21 DIAGNOSIS — R2689 Other abnormalities of gait and mobility: Secondary | ICD-10-CM

## 2018-07-21 DIAGNOSIS — R293 Abnormal posture: Secondary | ICD-10-CM

## 2018-07-21 DIAGNOSIS — I69351 Hemiplegia and hemiparesis following cerebral infarction affecting right dominant side: Secondary | ICD-10-CM

## 2018-07-21 DIAGNOSIS — R2681 Unsteadiness on feet: Secondary | ICD-10-CM

## 2018-07-21 DIAGNOSIS — I6350 Cerebral infarction due to unspecified occlusion or stenosis of unspecified cerebral artery: Secondary | ICD-10-CM | POA: Diagnosis not present

## 2018-07-21 NOTE — Patient Instructions (Signed)
Access Code: [email or web address]  URL: [email or web address]  Date: 07/21/2018  Prepared by: Theone Murdoch   Exercises Seated Shoulder External Rotation AAROM with Dowel - 10 reps - 2 sets - 1x daily - 7x weekly Standing Shoulder Abduction AAROM with Dowel - 10 reps - 2 sets - 1x daily - 7x weekly Supine Shoulder Flexion AAROM with Dowel - 10 reps - 2 sets - 1x daily - 7x weekly Circular Shoulder Pendulum with Table Support - 10 reps - 2 sets - 1x daily - 7x weekly Standing Shoulder Extension ROM with Dowel - 10 reps - 2 sets - 1x daily - 7x weekly

## 2018-07-21 NOTE — Therapy (Signed)
[facility] [address] [place], [place], [address] Phone: [phone]   Fax:  [phone]  Occupational Therapy Treatment  Patient Details  Name: Arthur Howard MRN: [id number] Date of Birth: 19-Jan-1966 No data recorded  Encounter Date: 07/21/2018  OT End of Session - 07/21/18 1446    Visit Number  14    Number of Visits  33    Date for OT Re-Evaluation  08/25/18    Authorization Type  [employer]    Authorization Time Period  12 weeks, 60 visit limit, counts as 1 visit if PT/OTsame day    Authorization - Visit Number  14   OT count   Authorization - Number of Visits  75    OT Start Time  1104    OT Stop Time  1145    OT Time Calculation (min)  41 min    Activity Tolerance  Patient tolerated treatment well    Behavior During Therapy  WFL for tasks assessed/performed       Past Medical History:  Diagnosis Date  . Diabetes mellitus without complication ([place])   . Hyperlipidemia     Past Surgical History:  Procedure Laterality Date  . ANKLE SURGERY      There were no vitals filed for this visit.  Subjective Assessment - 07/21/18 1430    Pertinent History  CVA, DM, hyperlipidemia    Limitations  non English speaking- MD gave pt shoulder injection and recommended limiting shoulder flexion to approx 90* fo 1 week, pt was diagnosed with frzen shoulder and shaoulder hand syndrome by Dr. Letta Pate,    Patient Stated Goals  recover use of RUE    Pain Score  5     Pain Location  Arm   hand   Pain Orientation  Right    Pain Descriptors / Indicators  Aching    Pain Type  Acute pain    Pain Onset  More than a month ago    Pain Frequency  Intermittent    Aggravating Factors   worse at night    Pain Relieving Factors  repositioning               Treatment:Pt was seen in the clinic today with staff performing universal masking and a reduced number of pt's in the clinic due to COVID-19. Gentle P/ROM, AA/ROM  finger flexion followed by pt squeezing yellow putty for increased finer flexion. Therapist discussed importance af moving hand through out the day to minimize stiffness. Supine closed chain chest press, shoulder flexion to 90*, min v.c facilitation. Pt/dtr were instructed in updated HEP, see pt instructions, min-mod v.c for performance. Wiping tabletop for increased RUE weightbearing, shoulder flexion and circumduction, min v.c Arm bike x 5 mins for reciprocal movement.             OT Short Term Goals - 07/21/18 1452      OT SHORT TERM GOAL #1   Title  I with inital HEP.    Time  6    Period  Weeks    Status  Achieved    Target Date  07/11/18      OT SHORT TERM GOAL #2   Title  Pt will demonstrate  improved RUE functional use as evidenced by increasing box/ blocks score by 8 blocks.    Baseline  RUE 48, LUE 75    Time  6    Period  Weeks    Status  On-going  OT SHORT TERM GOAL #3   Title  Pt will demonstrate ability to retrieve a light weight object from overhead shelf at 120 shoulder flexion with no more than min compensations.    Time  6    Period  Weeks    Status  On-going   07/13/18:  95*     OT SHORT TERM GOAL #4   Title  Pt will perfrom all basic ADLS modified independently.    Time  6    Period  Weeks    Status  Achieved   07/13/18     OT SHORT TERM GOAL #5   Title  Pt will increase RUE grip strength to 35 lbs of greater for increased functional use.    Time  6    Period  Weeks    Status  On-going   07/13/18:  2lbs     OT SHORT TERM GOAL #6   Title  Pt will report using his RUE to assist with ADLs at least 50% of the time or greater.    Time  12    Period  Weeks    Status  Achieved   07/13/18: met per pt report       OT Long Term Goals - 05/27/18 1326      OT LONG TERM GOAL #1   Title  Pt will demonstrate ability to retrieve a 3 lbs weight from overhead shelf demonstrating good control with RUE.    Time  12    Period  Weeks    Status  New     Target Date  08/25/18      OT LONG TERM GOAL #2   Title  Pt will perform mod complex home managment modified independently.    Time  12    Period  Weeks    Status  New      OT LONG TERM GOAL #3   Title  Pt will demonstrate improved RUE fine motor coordination for ADLS as evidenced by completing 9 hole peg test in 38 secs or less.    Time  12    Period  Weeks    Status  New      OT LONG TERM GOAL #4   Title  Pt will report that he has returned to using his RUE as dominant hand at least 75% of the time for ADLs/IADLs..    Time  12    Period  Weeks    Status  New      OT LONG TERM GOAL #5   Title  Pt will demonstrate ability to write his name and address legibily using RUE.    Time  12    Period  Weeks    Status  New      Long Term Additional Goals   Additional Long Term Goals  Yes      OT LONG TERM GOAL #6   Title  Pt will perform simulated work activities modified indpendently demonstrating good safety awareness.    Time  12    Period  Weeks    Status  New            Plan - 07/21/18 1448    Clinical Impression Statement  Pt was seen by MD Friday for injection. He was diagnosed with adhesive capsulitis and shoulder hand syndrome. Pt was instucted to avoid shoulder flexion >90 at this time. Pt is progressing slowly towards goals, limited by pain.    Occupational performance deficits (Please refer to  evaluation for details):  ADL's;IADL's;Work;Leisure;Social Participation    Body Structure / Function / Physical Skills  ADL;Dexterity;Flexibility;ROM;Strength;IADL;FMC;Coordination;Balance;UE functional use;GMC;Gait;Endurance;Decreased knowledge of precautions;Sensation    OT Frequency  2x / week    OT Duration  8 weeks    OT Treatment/Interventions  Self-care/ADL training;Cryotherapy;Paraffin;Therapeutic exercise;DME and/or AE instruction;Functional Mobility Training;Balance training;Splinting;Manual Therapy;Neuromuscular education;Fluidtherapy;Ultrasound;Aquatic  Therapy;Electrical Stimulation;Moist Heat;Contrast Bath;Energy conservation;Passive range of motion;Therapeutic activities;Patient/family education    Plan  continue checking goals, continue to monitor pain, neuro re-ed, ?splinting for spasticity/pain     Consulted and Agree with Plan of Care  Patient;Family member/caregiver    Family Member Consulted  dtr who interprets for him       Patient will benefit from skilled therapeutic intervention in order to improve the following deficits and impairments:  Body Structure / Function / Physical Skills  Visit Diagnosis: Muscle weakness (generalized)  Unsteadiness on feet  Hemiplegia and hemiparesis following cerebral infarction affecting right dominant side (HCC)  Other lack of coordination  Other abnormalities of gait and mobility  Abnormal posture    Problem List Patient Active Problem List   Diagnosis Date Noted  . Left pontine cerebrovascular accident ([place]) 05/11/2018  . Tobacco abuse   . Uncontrolled type 2 diabetes mellitus with hyperglycemia ([place])   . CVA (cerebral vascular accident) ([place]) 05/06/2018  . Hyperlipidemia 05/30/2017    RINE,KATHRYN 07/21/2018, 2:53 PM  [facility] [address] [place], [place], [address] Phone: [phone]   Fax:  [phone]  Name: Arthur Howard MRN: [id number] Date of Birth: [birth year]

## 2018-07-22 NOTE — Therapy (Signed)
[facility] [address] [place], [place], [address] Phone: [phone]   Fax:  [phone]  Physical Therapy Treatment  Patient Details  Name: Arthur Howard MRN: [id number] Date of Birth: 10-11-1965 Referring Provider (PT): Angiulli, Chapman Fitch (from hospital-f/u to be with Sherald Barge, NP)   Encounter Date: 07/21/2018  PT End of Session - 07/21/18 1019    Visit Number  15    Number of Visits  22    Date for PT Re-Evaluation  08/25/18    Authorization Type  [employer]-60 visits combined PT and OT if on same day    Authorization - Visit Number  15    Authorization - Number of Visits  60    PT Start Time  8338    PT Stop Time  1056    PT Time Calculation (min)  41 min    Equipment Utilized During Treatment  Gait belt    Activity Tolerance  Patient tolerated treatment well    Behavior During Therapy  [facility] for tasks assessed/performed       Past Medical History:  Diagnosis Date  . Diabetes mellitus without complication ([place])   . Hyperlipidemia     Past Surgical History:  Procedure Laterality Date  . ANKLE SURGERY      There were no vitals filed for this visit.  Subjective Assessment - 07/21/18 1016    Subjective  Had an injection from Dr. Letta Pate last week, needs to limit shoulder flexion to 90*. No falls.     Patient is accompained by:  Family member    Patient Stated Goals  Pt's goal is to improve R hand, arm, leg strength.    Currently in Pain?  Yes    Pain Score  5     Pain Location  Arm    Pain Orientation  Right    Pain Descriptors / Indicators  Stabbing    Pain Type  Acute pain    Pain Onset  More than a month ago    Pain Frequency  Intermittent    Aggravating Factors   worse at night, movement    Pain Relieving Factors  repositioning         [facility] PT Assessment - 07/21/18 1021      Transfers   Transfers  Sit to Stand;Stand to Sit    Sit to Stand  6: Modified independent  (Device/Increase time)    Stand to Sit  6: Modified independent (Device/Increase time);Without upper extremity assist;To bed    Number of Reps  10 reps;1 set    Comments  no UE's used from low mat table      Ambulation/Gait   Ambulation/Gait  Yes    Ambulation/Gait Assistance  5: Supervision    Ambulation/Gait Assistance Details  decreased push of noted with gait with mild antalgic pattern (decreased stance on right LE).     Ambulation Distance (Feet)  1000 Feet    Assistive device  None    Gait Pattern  Step-through pattern;Decreased arm swing - right;Decreased dorsiflexion - right    Ambulation Surface  Level;Unlevel;Indoor;Outdoor;Paved    Stairs  Yes    Stairs Assistance  6: Modified independent (Device/Increase time)    Stairs Assistance Details (indicate cue type and reason)  no rails- step to pattern. single rail reciprocal pattern.     Stair Management Technique  One rail Right;Alternating pattern;Forwards;No rails;Step to pattern    Number of Stairs  4   x2   Height  of Stairs  6      Dynamic Gait Index   Level Surface  Mild Impairment    Change in Gait Speed  Normal    Gait with Horizontal Head Turns  Normal    Gait with Vertical Head Turns  Normal    Gait and Pivot Turn  Normal    Step Over Obstacle  Normal    Step Around Obstacles  Normal    Steps  Mild Impairment    Total Score  22    DGI comment:  22/24 today          [facility] Adult PT Treatment/Exercise - 07/21/18 1021      Self-Care   Self-Care  Other Self-Care Comments    Other Self-Care Comments   pt reports*+       Neuro Re-ed    Neuro Re-ed Details   on red mat on floor: in half kneeling with right foot fwd- pushing into full standing then back down for 6 reps; in tall kneeling- bringing right LE fwd for half kneeling then back for 5 reps. cues on posutre and stability with both ex's.       Knee/Hip Exercises: Aerobic   Stepper  SciFit Stepper, Level 3.5, lower extremities/left UE x 6 minutes for leg  strengthening/activity tolerance with goal >/= 50 rpm      Knee/Hip Exercises: Machines for Strengthening   Cybex Leg Press  bil. LE's 80# 20 reps;  RLE only 55# 2 sets 10 reps with cues for eccentric control with return to flexed position                PT Short Term Goals - 06/23/18 1106      PT SHORT TERM GOAL #1   Title  Pt will be independent with HEP for improved transfers, balance, gait and strength.  TARGET 06/25/2018    Baseline  06/23/18: met with current program    Status  Achieved    Target Date  06/25/18      PT SHORT TERM GOAL #2   Title  Pt will improve 5x sit<>stand to less than or equal to 12.5 seconds for improved transfer efficiency and lower extremity strength.    Baseline  06/23/18: 9.90 sec's no UE support from standard height surface    Time  --    Period  --    Status  Achieved    Target Date  06/25/18      PT SHORT TERM GOAL #3   Title  Pt will improve TUG score to less than or equal to 13.5 sec for decreased fall risk.    Baseline  06/23/18: 7.13 sec's no device except for right AFO    Time  --    Period  --    Status  Achieved    Target Date  06/25/18      PT SHORT TERM GOAL #4   Title  Pt will improve DGI score to at least 17/24 for decreased fall risk.    Baseline  06/23/18: 19/24 scored today    Time  --    Period  --    Status  Achieved    Target Date  06/25/18      PT SHORT TERM GOAL #5   Title  Pt will negotiate 4 steps with alternating pattern, 1 rail modified independently, for preparation for return to work activities.    Baseline  06/23/18: met today    Time  --    Period  --  Status  Achieved    Target Date  06/25/18      PT SHORT TERM GOAL #6   Title  Pt will verbalize understanding of fall prevention in home environment.    Baseline  06/23/18: met today    Time  --    Period  --    Status  Achieved    Target Date  06/25/18        PT Long Term Goals - 07/21/18 1019      PT LONG TERM GOAL #1   Title  Pt will be independent  with progressive HEP for strength, balance, and gait.  TARGET 07/23/2018    Baseline  07/21/18: met with current program    Time  --    Period  --    Status  Achieved      PT LONG TERM GOAL #2   Title  Pt will perform 8 of 10 reps from surfaces <18" with no UE support, for improved transfer efficeincy and improved lower extremity strength.    Baseline  07/21/18: met today    Time  --    Period  --    Status  Achieved      PT LONG TERM GOAL #3   Title  Pt will improve DGI score to at least 19/24 for decreased fall risk.    Baseline  07/21/18: met with score of 22/24    Time  --    Period  --    Status  Achieved      PT LONG TERM GOAL #4   Title  Pt will ambulate at least 1000 ft, independently, no LOB, indoor and outdoor surfaces, for improved community gait.    Baseline  07/21/18: continues to have mild gait deviations placing him at supervision level    Time  --    Period  --    Status  Partially Met      PT LONG TERM GOAL #5   Title  Pt will perform floor>stand transfer independently, for safe fall recovery/floor activities/return to work.    Baseline  07/21/18: supervision for partial floor transfer with neuro re-ed activities    Time  --    Period  --    Status  Partially Met            Plan - 07/21/18 1019    Clinical Impression Statement  Today's skilled session focused on progress toward LTGs with goals partially met to fully met. Remainder of session continued to address strengthening and muscle re-education.     Comorbidities  hyperlipidemia, DM    Rehab Potential  Good    PT Frequency  2x / week    PT Duration  8 weeks    PT Treatment/Interventions  ADLs/Self Care Home Management;Electrical Stimulation;DME Instruction;Gait training;Stair training;Functional mobility training;Therapeutic activities;Therapeutic exercise;Orthotic Fit/Training;Patient/family education;Neuromuscular re-education;Balance training;Manual techniques    PT Next Visit Plan  continue use of  Bioness on R lower leg as able or functional strengthening/gait without AFO;  higher level balance simulating work tasks.  Floor transfer training; wk of 07/19/2018 is wk 7 in [place] (begin checking LTGS)-discuss renewal    PT Home Exercise Plan  Access Code: [email or web address] , plus gastroc stretch    Consulted and Agree with Plan of Care  Patient;Family member/caregiver    Family Member Consulted  Daughter       Patient will benefit from skilled therapeutic intervention in order to improve the following deficits and impairments:  Abnormal gait, Decreased balance, Decreased  mobility, Difficulty walking, Decreased strength, Impaired flexibility, Impaired tone, Postural dysfunction  Visit Diagnosis: Muscle weakness (generalized)  Unsteadiness on feet  Hemiplegia and hemiparesis following cerebral infarction affecting right dominant side [facility])     Problem List Patient Active Problem List   Diagnosis Date Noted  . Left pontine cerebrovascular accident ([place]) 05/11/2018  . Tobacco abuse   . Uncontrolled type 2 diabetes mellitus with hyperglycemia ([place])   . CVA (cerebral vascular accident) ([place]) 05/06/2018  . Hyperlipidemia 05/30/2017    Willow Ora, PTA, [facility] Outpatient Neuro [facility] [address], [place], [place] [address] [phone] 07/22/18, 5:57 PM   Name: Arthur Howard MRN: [id number] Date of Birth: January 21, 1966

## 2018-07-23 ENCOUNTER — Ambulatory Visit: Payer: Commercial Indemnity | Attending: Physician Assistant | Admitting: Occupational Therapy

## 2018-07-23 ENCOUNTER — Encounter: Payer: Self-pay | Admitting: Rehabilitative and Restorative Service Providers"

## 2018-07-23 ENCOUNTER — Ambulatory Visit: Payer: Commercial Indemnity | Admitting: Rehabilitative and Restorative Service Providers"

## 2018-07-23 ENCOUNTER — Other Ambulatory Visit: Payer: Self-pay

## 2018-07-23 DIAGNOSIS — R293 Abnormal posture: Secondary | ICD-10-CM | POA: Insufficient documentation

## 2018-07-23 DIAGNOSIS — M6281 Muscle weakness (generalized): Secondary | ICD-10-CM | POA: Insufficient documentation

## 2018-07-23 DIAGNOSIS — R2689 Other abnormalities of gait and mobility: Secondary | ICD-10-CM | POA: Diagnosis present

## 2018-07-23 DIAGNOSIS — R278 Other lack of coordination: Secondary | ICD-10-CM | POA: Insufficient documentation

## 2018-07-23 DIAGNOSIS — R2681 Unsteadiness on feet: Secondary | ICD-10-CM

## 2018-07-23 DIAGNOSIS — I69351 Hemiplegia and hemiparesis following cerebral infarction affecting right dominant side: Secondary | ICD-10-CM | POA: Diagnosis present

## 2018-07-23 NOTE — Therapy (Signed)
[facility] [address] [place], [place], [address] Phone: [phone]   Fax:  [phone]  Physical Therapy Treatment and Goal Update  Patient Details  Name: Arthur Howard MRN: [id number] Date of Birth: 05/18/1965 Referring Provider (PT): Angiulli, Daniel PA-C (from hospital-f/u to be with Jessica VanSchaik, NP)   Encounter Date: 07/23/2018  PT End of Session - 07/23/18 1152    Visit Number  16    Number of Visits  22    Date for PT Re-Evaluation  08/25/18    Authorization Type  [employer]-60 visits combined PT and OT if on same day    Authorization - Visit Number  16    Authorization - Number of Visits  60    PT Start Time  1150    PT Stop Time  1230    PT Time Calculation (min)  40 min    Equipment Utilized During Treatment  Gait belt    Activity Tolerance  Patient tolerated treatment well    Behavior During Therapy  WFL for tasks assessed/performed       Past Medical History:  Diagnosis Date  . Diabetes mellitus without complication (HCC)   . Hyperlipidemia     Past Surgical History:  Procedure Laterality Date  . ANKLE SURGERY     CLINIC OPERATION CHANGES: [facility] is operating at a low capacity due to COVID-19.  The patient was brought into the clinic for evaluation and/or treatment following universal masking by staff, social distancing, and <10 people in the clinic.  The patient's COVID risk of complications score is 3.   There were no vitals filed for this visit.  Subjective Assessment - 07/23/18 1151    Subjective  The patient reports he can do everything when he doesn't hurt.  When he hurts, he cannot do anything.  He gets right leg weakness after about 25 minutes of activity.    Patient is accompained by:  Family member   daughter present   Patient Stated Goals  Pt's goal is to improve R hand, arm, leg strength.    Currently in Pain?  No/denies                        [facility] Adult PT Treatment/Exercise - 07/23/18 1600      Ambulation/Gait   Ambulation/Gait  Yes    Ambulation/Gait Assistance  6: Modified independent (Device/Increase time)    Ambulation/Gait Assistance Details  Ambulated without AFO with mild notice of asymmetry with weakness R LE noted as patient fatigues t/o session.    Stairs  Yes    Stairs Assistance  7: Independent    Stair Management Technique  No rails;Alternating pattern    Number of Stairs  4    Gait Comments  Dynamic gait activities walking on tip toes, walking on heels, squat walking, marching, marching on toes, backwards walking with supervision.        Neuro Re-ed    Neuro Re-ed Details   STANDING in parallel bars performing unilateral stance on lateral moving rocker board with R UE support working on balance control.  Performed rhythmic stabilization to engage muscles in dynamic balance activities.  Standing <> 1/2 kneeling <> return to standing x 5 reps alternating LEs without UE support.  Standing jumping tasks in place and then wide<>narrow.  Lateral hopping with L UE providing cradling support to R UE x 10 reps with dec'd motor timing noted on R side.    R single leg forward lifts (reaching to therapy mat and back to stand) remaining in single leg stance.  Single leg squats for R LE motor control x 5 reps.    Step ups to 4" step while turning x 5 reps, then added on toes during step up turns.      Exercises   Exercises  Other Exercises    Other Exercises   Standing heel raises x 10 reps with toes in and then with toes out to engage distal ankle musculature.               PT Short Term Goals - 06/23/18 1106      PT SHORT TERM GOAL #1   Title  Pt will be independent with HEP for improved transfers, balance, gait and strength.  TARGET 06/25/2018    Baseline  06/23/18: met with current program    Status  Achieved    Target Date  06/25/18      PT SHORT TERM GOAL #2   Title  Pt will  improve 5x sit<>stand to less than or equal to 12.5 seconds for improved transfer efficiency and lower extremity strength.    Baseline  06/23/18: 9.90 sec's no UE support from standard height surface    Time  --    Period  --    Status  Achieved    Target Date  06/25/18      PT SHORT TERM GOAL #3   Title  Pt will improve TUG score to less than or equal to 13.5 sec for decreased fall risk.    Baseline  06/23/18: 7.13 sec's no device except for right AFO    Time  --    Period  --    Status  Achieved    Target Date  06/25/18      PT SHORT TERM GOAL #4   Title  Pt will improve DGI score to at least 17/24 for decreased fall risk.    Baseline  06/23/18: 19/24 scored today    Time  --    Period  --    Status  Achieved    Target Date  06/25/18      PT SHORT TERM GOAL #5   Title  Pt will negotiate 4 steps with alternating pattern, 1 rail modified independently, for preparation for return to work activities.    Baseline  06/23/18: met today    Time  --    Period  --    Status  Achieved    Target Date  06/25/18      PT SHORT TERM GOAL #6   Title  Pt will verbalize understanding of fall prevention in home environment.    Baseline  06/23/18: met today    Time  --    Period  --    Status  Achieved    Target Date  06/25/18        PT Long Term Goals - 07/23/18 1606      PT LONG TERM GOAL #1   Title  Pt will be independent with progressive HEP for strength, balance, and gait.  TARGET 07/23/2018    Baseline  07/21/18: met with current program    Status  Achieved      PT LONG TERM GOAL #2   Title  Pt will perform 8 of 10 reps from surfaces <18" with no UE support, for improved transfer efficeincy and improved lower extremity strength.    Baseline  07/21/18: met today  Status  Achieved      PT LONG TERM GOAL #3   Title  Pt will improve DGI score to at least 19/24 for decreased fall risk.    Baseline  07/21/18: met with score of 22/24    Status  Achieved      PT LONG TERM GOAL #4   Title   Pt will ambulate at least 1000 ft, independently, no LOB, indoor and outdoor surfaces, for improved community gait.    Baseline  07/21/18: continues to have mild gait deviations placing him at supervision level    Status  Partially Met      PT LONG TERM GOAL #5   Title  Pt will perform floor>stand transfer independently, for safe fall recovery/floor activities/return to work.    Baseline  Patient was able to move stand <> 1/2 kneeling without UE support.  He would be indep with floor transfer.    Status  Achieved       UPDATED LONG TERM GOALS:  PT Long Term Goals - 07/23/18 1608      PT LONG TERM GOAL #1   Title  The patient will be independent with progression of HEP for discharge.    Time  4    Period  Weeks    Target Date  08/22/18      PT LONG TERM GOAL #2   Title  The patient will demonstrate return to climbing an A frame ladder with ability to balance, step up with R LE for return to household and work activities.    Time  4    Period  Weeks    Target Date  08/22/18      PT LONG TERM GOAL #3   Title  The patient will be able to walk > 30 minutes without use of AFO without loss of balance.    Time  4    Period  Weeks    Target Date  08/22/18      PT LONG TERM GOAL #4   Title  The patient will be able to return to yard work activities (discusses wanting to mow the yard-- fairly level surface, however this may depend more on R shoulder pain than balance/gait).      Time  4    Period  Weeks    Target Date  08/22/18      PT LONG TERM GOAL #5   Title  The patient will be able to walk while carrying items (grocery bags, household items, etc).      Time  4    Period  Weeks    Target Date  08/22/18           Plan - 07/24/18 1151    Clinical Impression Statement  The patient is progressing well with mobility.  PT modifying/updating long term goals to address return to more advanced functional tasks including yard work, home activities, climbing ladders, as safely able.   The patient continues with some distal weakness and dec'd muscular endurance impacting extended community gait.    He also appears to have limited thoracic spinal extension and uses cervical extention to compensate during higher level/challenging tasks.  He may benefit from joint mobilization and end range isometrics to improve posture in thoracic spine for UE positioning.    PT Treatment/Interventions  ADLs/Self Care Home Management;Electrical Stimulation;DME Instruction;Gait training;Stair training;Functional mobility training;Therapeutic activities;Therapeutic exercise;Orthotic Fit/Training;Patient/family education;Neuromuscular re-education;Balance training;Manual techniques;Spinal Manipulations    PT Next Visit Plan  Functional strengthening, ladder climbing, high step   ups, use of Bioness during functional strengthening if indicated.  Prolonged gait for endurance.  Return to yard work.  Thoracic spine posterior>Anterior grade III-IV mobs and adding thoracic mobilization with movement and end range muscle contraction for posture re-ed  * this may reduce shoulder pain as he maintains a rounded thoracic position and compensates with cervical extension.    Consulted and Agree with Plan of Care  Patient;Family member/caregiver    Family Member Consulted  Daughter        Patient will benefit from skilled therapeutic intervention in order to improve the following deficits and impairments:     Visit Diagnosis: Muscle weakness (generalized)  Unsteadiness on feet  Other abnormalities of gait and mobility     Problem List Patient Active Problem List   Diagnosis Date Noted  . Left pontine cerebrovascular accident ([place]) 05/11/2018  . Tobacco abuse   . Uncontrolled type 2 diabetes mellitus with hyperglycemia ([place])   . CVA (cerebral vascular accident) ([place]) 05/06/2018  . Hyperlipidemia 05/30/2017    Arthur Howard, PT 07/23/2018, 4:07 PM  [facility] [address] [place], [place], [address] Phone: [phone]   Fax:  [phone]  Name: Arthur Howard MRN: [id number] Date of Birth: [birth year]

## 2018-07-23 NOTE — Therapy (Signed)
[facility] [address] [place], [place], [address] Phone: [phone]   Fax:  [phone]  Occupational Therapy Treatment  Patient Details  Name: Arthur Howard MRN: [id number] Date of Birth: Jul 31, 1965 No data recorded  Encounter Date: 07/23/2018  OT End of Session - 07/23/18 1717    Visit Number  15    Number of Visits  33    Date for OT Re-Evaluation  08/25/18    Authorization Type  [employer]    Authorization Time Period  12 weeks, 60 visit limit, counts as 1 visit if PT/OTsame day    Authorization - Visit Number  15    Authorization - Number of Visits  49    OT Start Time  1105    OT Stop Time  1145    OT Time Calculation (min)  40 min    Activity Tolerance  Patient tolerated treatment well    Behavior During Therapy  WFL for tasks assessed/performed       Past Medical History:  Diagnosis Date  . Diabetes mellitus without complication ([place])   . Hyperlipidemia     Past Surgical History:  Procedure Laterality Date  . ANKLE SURGERY      There were no vitals filed for this visit.  Subjective Assessment - 07/23/18 1716    Limitations  non English speaking- MD gave pt shoulder injection and recommended limiting shoulder flexion to approx 90* fo 1 week, pt was diagnosed with frzen shoulder and shaoulder hand syndrome by Dr. Letta Pate,    Patient Stated Goals  recover use of RUE    Currently in Pain?  Yes    Pain Score  3     Pain Location  Arm   hand   Pain Orientation  Right    Pain Type  Acute pain    Pain Onset  More than a month ago    Pain Frequency  Intermittent    Aggravating Factors   worse at night    Pain Relieving Factors  repositioning             Treatment:Pt was seen in the clinic today with staff performing universal masking and a reduced number of pt's in the clinic due to COVID-19. Gentle P/ROM, AA/ROM finger flexion and wrist flexion extension with gentle distraction. Therapist  discussed importance af moving hand through out the day to minimize stiffness. Supine closed chain chest press, shoulder flexion to 90*, min v.c facilitation. Pt performed  updated HEP, min v.c for performance. UE ranger mid range for shoulder flexion and circumduction, min v.c/ facilitation Arm bike x 5 mins level 3 for reciprocal movement. Low to mid range functional grasp/ release, min facilitation End of session pain decreased to 1.5                 OT Short Term Goals - 07/21/18 1452      OT SHORT TERM GOAL #1   Title  I with inital HEP.    Time  6    Period  Weeks    Status  Achieved    Target Date  07/11/18      OT SHORT TERM GOAL #2   Title  Pt will demonstrate  improved RUE functional use as evidenced by increasing box/ blocks score by 8 blocks.    Baseline  RUE 48, LUE 75    Time  6    Period  Weeks    Status  On-going  OT SHORT TERM GOAL #3   Title  Pt will demonstrate ability to retrieve a light weight object from overhead shelf at 120 shoulder flexion with no more than min compensations.    Time  6    Period  Weeks    Status  On-going   07/13/18:  95*     OT SHORT TERM GOAL #4   Title  Pt will perfrom all basic ADLS modified independently.    Time  6    Period  Weeks    Status  Achieved   07/13/18     OT SHORT TERM GOAL #5   Title  Pt will increase RUE grip strength to 35 lbs of greater for increased functional use.    Time  6    Period  Weeks    Status  On-going   07/13/18:  2lbs     OT SHORT TERM GOAL #6   Title  Pt will report using his RUE to assist with ADLs at least 50% of the time or greater.    Time  12    Period  Weeks    Status  Achieved   07/13/18: met per pt report       OT Long Term Goals - 05/27/18 1326      OT LONG TERM GOAL #1   Title  Pt will demonstrate ability to retrieve a 3 lbs weight from overhead shelf demonstrating good control with RUE.    Time  12    Period  Weeks    Status  New    Target Date   08/25/18      OT LONG TERM GOAL #2   Title  Pt will perform mod complex home managment modified independently.    Time  12    Period  Weeks    Status  New      OT LONG TERM GOAL #3   Title  Pt will demonstrate improved RUE fine motor coordination for ADLS as evidenced by completing 9 hole peg test in 38 secs or less.    Time  12    Period  Weeks    Status  New      OT LONG TERM GOAL #4   Title  Pt will report that he has returned to using his RUE as dominant hand at least 75% of the time for ADLs/IADLs..    Time  12    Period  Weeks    Status  New      OT LONG TERM GOAL #5   Title  Pt will demonstrate ability to write his name and address legibily using RUE.    Time  12    Period  Weeks    Status  New      Long Term Additional Goals   Additional Long Term Goals  Yes      OT LONG TERM GOAL #6   Title  Pt will perform simulated work activities modified indpendently demonstrating good safety awareness.    Time  12    Period  Weeks    Status  New            Plan - 07/23/18 1718    Clinical Impression Statement  Pt demonstrates improvement in pain and RUE functional use today.    Occupational performance deficits (Please refer to evaluation for details):  ADL's;IADL's;Work;Leisure;Social Participation    Body Structure / Function / Physical Skills  ADL;Dexterity;Flexibility;ROM;Strength;IADL;FMC;Coordination;Balance;UE functional use;GMC;Gait;Endurance;Decreased knowledge of precautions;Sensation    Rehab Potential  Good    OT Frequency  2x / week    OT Duration  8 weeks    OT Treatment/Interventions  Self-care/ADL training;Cryotherapy;Paraffin;Therapeutic exercise;DME and/or AE instruction;Functional Mobility Training;Balance training;Splinting;Manual Therapy;Neuromuscular education;Fluidtherapy;Ultrasound;Aquatic Therapy;Electrical Stimulation;Moist Heat;Contrast Bath;Energy conservation;Passive range of motion;Therapeutic activities;Patient/family education    Plan   continue checking goals, continue to monitor pain, neuro re-ed, ?splinting for spasticity/pain     Consulted and Agree with Plan of Care  Patient;Family member/caregiver    Family Member Consulted  dtr who interprets for him       Patient will benefit from skilled therapeutic intervention in order to improve the following deficits and impairments:  Body Structure / Function / Physical Skills  Visit Diagnosis: Muscle weakness (generalized)  Hemiplegia and hemiparesis following cerebral infarction affecting right dominant side (HCC)  Other lack of coordination  Other abnormalities of gait and mobility  Abnormal posture    Problem List Patient Active Problem List   Diagnosis Date Noted  . Left pontine cerebrovascular accident ([place]) 05/11/2018  . Tobacco abuse   . Uncontrolled type 2 diabetes mellitus with hyperglycemia ([place])   . CVA (cerebral vascular accident) ([place]) 05/06/2018  . Hyperlipidemia 05/30/2017    , 07/23/2018, 5:19 PM  [facility] [address] [place], [place], [address] Phone: [phone]   Fax:  [phone]  Name: OZIAS DICENZO MRN: [id number] Date of Birth: 1966-03-15

## 2018-07-28 ENCOUNTER — Encounter: Payer: Self-pay | Admitting: Occupational Therapy

## 2018-07-28 ENCOUNTER — Other Ambulatory Visit: Payer: Self-pay

## 2018-07-28 ENCOUNTER — Ambulatory Visit: Payer: Commercial Indemnity | Admitting: Occupational Therapy

## 2018-07-28 ENCOUNTER — Ambulatory Visit: Payer: Commercial Indemnity | Admitting: Physical Therapy

## 2018-07-28 DIAGNOSIS — R2689 Other abnormalities of gait and mobility: Secondary | ICD-10-CM

## 2018-07-28 DIAGNOSIS — R2681 Unsteadiness on feet: Secondary | ICD-10-CM

## 2018-07-28 DIAGNOSIS — R293 Abnormal posture: Secondary | ICD-10-CM

## 2018-07-28 DIAGNOSIS — M6281 Muscle weakness (generalized): Secondary | ICD-10-CM | POA: Diagnosis not present

## 2018-07-28 DIAGNOSIS — I69351 Hemiplegia and hemiparesis following cerebral infarction affecting right dominant side: Secondary | ICD-10-CM

## 2018-07-28 DIAGNOSIS — R278 Other lack of coordination: Secondary | ICD-10-CM

## 2018-07-28 NOTE — Therapy (Signed)
[facility] [address] [place], [place], [address] Phone: [phone]   Fax:  [phone]  Physical Therapy Treatment  Patient Details  Name: Arthur Howard MRN: [id number] Date of Birth: 22-Nov-1965 Referring Provider (PT): Angiulli, Chapman Fitch (from hospital-f/u to be with Sherald Barge, NP)   CLINIC OPERATION CHANGES: [facility] is operating at a low capacity due to COVID-19.  The patient was brought into the clinic for evaluation and/or treatment following  universal masking by staff, social distancing, and <10 people in the clinic.  The patient's COVID risk of complications score is 3.   Encounter Date: 07/28/2018  PT End of Session - 07/28/18 1912    Visit Number  17    Number of Visits  22    Authorization Type  [employer]-60 visits combined PT and OT if on same day    Authorization - Visit Number  17    Authorization - Number of Visits  60    PT Start Time  1018    PT Stop Time  1100    PT Time Calculation (min)  42 min    Activity Tolerance  Patient tolerated treatment well    Behavior During Therapy  WFL for tasks assessed/performed       Past Medical History:  Diagnosis Date  . Diabetes mellitus without complication ([place])   . Hyperlipidemia     Past Surgical History:  Procedure Laterality Date  . ANKLE SURGERY      There were no vitals filed for this visit.  Subjective Assessment - 07/28/18 1752    Subjective  Pt reports no new changes -says Rt leg is getting stronger; pt also reports he is walking 25"-30"/day     Patient is accompained by:  Family member   daughter present (interprets)   Patient Stated Goals  Pt's goal is to improve R hand, arm, leg strength.    Currently in Pain?  Yes    Pain Score  2     Pain Location  Wrist    Pain Orientation  Right    Pain Descriptors / Indicators  Aching    Pain Type  Chronic pain    Pain Onset  More than a month ago     Pain Frequency  Intermittent    Aggravating Factors   malpositioning    Pain Relieving Factors  repositioning                       [facility] Adult PT Treatment/Exercise - 07/28/18 1027      Knee/Hip Exercises: Stretches   Active Hamstring Stretch  Right;2 reps;30 seconds   Runner's stretch - performed on steps   Gastroc Stretch  Right;2 reps;30 seconds   Rt heel off edge of 6" step      Knee/Hip Exercises: Aerobic   Elliptical  Level 2.0 1" forward and 1" backward - 15 sec rest between each       Knee/Hip Exercises: Machines for Strengthening   Cybex Leg Press  bil. LE's 80# 15 reps;  RLE only 60# 2 sets 10 reps    cues for eccentric control      Knee/Hip Exercises: Plyometrics   Bilateral Jumping  1 set;10 reps   on floor   Unilateral Jumping  1 set;10 reps;Other (comment)   RLE unilateral hopping - on mini trampoline w/UE support prn   Other Plyometric Exercises  Lunges 10 reps inside // bars without UE support  Knee/Hip Exercises: Standing   Heel Raises  Both;1 set;10 reps;3 seconds   RLE only 10 reps - standing on floor with UE support on rail   Forward Step Up  Right;1 set;10 reps;Hand Hold: 0;Step Height: 6"    Step Down  Right;1 set;10 reps;Hand Hold: 1;Step Height: 6"       Pt performed Rt unilateral heel raise off side of high/low mat table - 3 sets 10 reps; Lt foot on balance bubble to increase RLE weight bearing  Pt performed amb. On tiptoes with knees slightly flexed forward/backward x 2 reps inside // bars; sideways 2 reps inside // bars with knees flexed on tip toes for knee control  Pt performed standing Rt knee flexion with 5# weight at counter to prevent Rt hip flexion during knee flexion - 2 sets 10 reps  Pt performed Rt hip extension with knee flexed at 90 degrees with 3# weight 10 reps - assistance needed to maintain Rt knee flexion at 90 degrees   TherAct; pt performed jogging 40' x 1 rep only due to c/o RLE fatigue after having  performed plyometric activities  Balance Exercises - 07/28/18 1910      Balance Exercises: Standing   Other Standing Exercises  standing on Airex without UE support - touching tops of cones (2) with each foot; pt had no difficulty or LOB with this activity        Pt performed standing on Bosu - squats x 10 reps without UE support; pt stood on RLE with foot in middle of Bosu - performed Lt Hip flexion 10 reps; Lt hip abduction 10 reps with minimal UE support to facilitate RLE SLS and improved Rt knee control    PT Short Term Goals - 06/23/18 1106      PT SHORT TERM GOAL #1   Title  Pt will be independent with HEP for improved transfers, balance, gait and strength.  TARGET 06/25/2018    Baseline  06/23/18: met with current program    Status  Achieved    Target Date  06/25/18      PT SHORT TERM GOAL #2   Title  Pt will improve 5x sit<>stand to less than or equal to 12.5 seconds for improved transfer efficiency and lower extremity strength.    Baseline  06/23/18: 9.90 sec's no UE support from standard height surface    Time  --    Period  --    Status  Achieved    Target Date  06/25/18      PT SHORT TERM GOAL #3   Title  Pt will improve TUG score to less than or equal to 13.5 sec for decreased fall risk.    Baseline  06/23/18: 7.13 sec's no device except for right AFO    Time  --    Period  --    Status  Achieved    Target Date  06/25/18      PT SHORT TERM GOAL #4   Title  Pt will improve DGI score to at least 17/24 for decreased fall risk.    Baseline  06/23/18: 19/24 scored today    Time  --    Period  --    Status  Achieved    Target Date  06/25/18      PT SHORT TERM GOAL #5   Title  Pt will negotiate 4 steps with alternating pattern, 1 rail modified independently, for preparation for return to work activities.    Baseline  06/23/18:  met today    Time  --    Period  --    Status  Achieved    Target Date  06/25/18      PT SHORT TERM GOAL #6   Title  Pt will verbalize  understanding of fall prevention in home environment.    Baseline  06/23/18: met today    Time  --    Period  --    Status  Achieved    Target Date  06/25/18        PT Long Term Goals - 07/23/18 1608      PT LONG TERM GOAL #1   Title  The patient will be independent with progression of HEP for discharge.    Time  4    Period  Weeks    Target Date  08/22/18      PT LONG TERM GOAL #2   Title  The patient will demonstrate return to climbing an A frame ladder with ability to balance, step up with R LE for return to household and work activities.    Time  4    Period  Weeks    Target Date  08/22/18      PT LONG TERM GOAL #3   Title  The patient will be able to walk > 30 minutes without use of AFO without loss of balance.    Time  4    Period  Weeks    Target Date  08/22/18      PT LONG TERM GOAL #4   Title  The patient will be able to return to yard work activities (discusses wanting to mow the yard-- fairly level surface, however this may depend more on R shoulder pain than balance/gait).      Time  4    Period  Weeks    Target Date  08/22/18      PT LONG TERM GOAL #5   Title  The patient will be able to walk while carrying items (grocery bags, household items, etc).      Time  4    Period  Weeks    Target Date  08/22/18            Plan - 07/28/18 1912    Clinical Impression Statement  Pt reported fatigue in RLE after plyometric exercises (jumping, hopping, lunges) and after performing elliptical exercise; pt able to jog approx. 13' after these exercises but stated his Rt leg was too tired to attempt again; pt had no LOB with jogging. RLE progressing in strength with no hyperextension of knee noted with any activity.                                                                                                                                Personal Factors and Comorbidities  Comorbidity 2;Past/Current Experience    Comorbidities  hyperlipidemia, DM     Examination-Activity Limitations  Carry;Stairs;Locomotion Level;Transfers  Examination-Participation Restrictions  Community Activity;Yard Work;Other    Rehab Potential  Good    PT Frequency  2x / week    PT Duration  8 weeks    PT Treatment/Interventions  ADLs/Self Care Home Management;Electrical Stimulation;DME Instruction;Gait training;Stair training;Functional mobility training;Therapeutic activities;Therapeutic exercise;Orthotic Fit/Training;Patient/family education;Neuromuscular re-education;Balance training;Manual techniques;Spinal Manipulations    PT Next Visit Plan  per plan from 07-23-18:  Functional strengthening, ladder climbing, high step ups, use of Bioness during functional strengthening if indicated.  Prolonged gait for endurance.  Return to yard work.  Thoracic spine posterior>Anterior grade III-IV mobs and adding thoracic mobilization with movement and end range muscle contraction for posture re-ed  * this may reduce shoulder pain as he maintains a rounded thoracic position and compensates with cervical extension.   I focused on RLE strengthening and muscle endurance training (07-28-18)   PT Home Exercise Plan  Access Code: [email or web address] , plus gastroc stretch    Consulted and Agree with Plan of Care  Patient;Family member/caregiver    Family Member Consulted  Daughter       Patient will benefit from skilled therapeutic intervention in order to improve the following deficits and impairments:  Abnormal gait, Decreased balance, Decreased mobility, Difficulty walking, Decreased strength, Impaired flexibility, Impaired tone, Postural dysfunction  Visit Diagnosis: Hemiplegia and hemiparesis following cerebral infarction affecting right dominant side (HCC)  Unsteadiness on feet  Other abnormalities of gait and mobility  Muscle weakness (generalized)     Problem List Patient Active Problem List   Diagnosis Date Noted  . Left pontine cerebrovascular accident ([facility]) 05/11/2018  .  Tobacco abuse   . Uncontrolled type 2 diabetes mellitus with hyperglycemia ([place])   . CVA (cerebral vascular accident) ([place]) 05/06/2018  . Hyperlipidemia 05/30/2017    Alda Lea, PT 07/28/2018, 7:28 PM  [facility] [address] [place], [place], [address] Phone: [phone]   Fax:  [phone]  Name: Arthur Howard MRN: [id number] Date of Birth: [birth year]

## 2018-07-28 NOTE — Therapy (Signed)
[facility] [address] [place], [place], [address] Phone: [phone]   Fax:  [phone]  Occupational Therapy Treatment  Patient Details  Name: Arthur Howard MRN: [id number] Date of Birth: 07/17/1965 No data recorded  Encounter Date: 07/28/2018  OT End of Session - 07/28/18 1006    Visit Number  16    Number of Visits  33    Date for OT Re-Evaluation  08/25/18    Authorization Type  [employer]    Authorization Time Period  12 weeks, 60 visit limit, counts as 1 visit if PT/OTsame day    Authorization - Visit Number  16    Authorization - Number of Visits  62    OT Start Time  0934    OT Stop Time  1015    OT Time Calculation (min)  41 min    Activity Tolerance  Patient tolerated treatment well    Behavior During Therapy  WFL for tasks assessed/performed       Past Medical History:  Diagnosis Date  . Diabetes mellitus without complication ([facility])   . Hyperlipidemia     Past Surgical History:  Procedure Laterality Date  . ANKLE SURGERY      There were no vitals filed for this visit.  Subjective Assessment - 07/28/18 1009    Pertinent History  CVA, DM, hyperlipidemia    Limitations  non English speaking- MD gave pt shoulder injection and recommended limiting shoulder flexion to approx 90* fo 1 week, pt was diagnosed with frzen shoulder and shaoulder hand syndrome by Dr. Letta Pate,    Patient Stated Goals  recover use of RUE    Currently in Pain?  Yes    Pain Score  2     Pain Location  Shoulder    Pain Orientation  Right    Pain Descriptors / Indicators  Aching    Pain Type  Chronic pain    Pain Onset  More than a month ago    Pain Frequency  Intermittent    Aggravating Factors   malpositioning    Pain Relieving Factors  repositioning    Pain Score  4    Pain Location  Hand    Pain Orientation  Right    Pain Descriptors / Indicators  Aching    Pain Type  Acute pain    Pain Onset  More than a month ago     Pain Frequency  Intermittent    Aggravating Factors   movement    Pain Relieving Factors  repositioning             Treatment:Pt was seen in the clinic today with staff performing universal masking, and pt maskedwith a reduced number of pt's in the clinic due to COVID-19. Gentle P/ROM, AA/ROM finger flexion and wrist flexion extension with gentle distraction. Therapist discussed importance af moving hand through out the day to minimize stiffness. Supine closed chain chest scapular retraction/ protraction, shoulder flexion , min v.c facilitation. UE ranger mid range for shoulder flexion and circumduction, min v.c/ facilitation Prone shoulder extension and scapular retraction x 15 reps min v.c followed by weightbeaing on elbows lifting chest then modified plank on elbows and knees. Fluidotherapy x 9 mins to RUE for pain relief, with pt perfroming A/ROM finger and wrist flexion/ extension.Hand pain decreased to 2/10 end of session.                 OT Short Term Goals - 07/21/18 1452  OT SHORT TERM GOAL #1   Title  I with inital HEP.    Time  6    Period  Weeks    Status  Achieved    Target Date  07/11/18      OT SHORT TERM GOAL #2   Title  Pt will demonstrate  improved RUE functional use as evidenced by increasing box/ blocks score by 8 blocks.    Baseline  RUE 48, LUE 75    Time  6    Period  Weeks    Status  On-going      OT SHORT TERM GOAL #3   Title  Pt will demonstrate ability to retrieve a light weight object from overhead shelf at 120 shoulder flexion with no more than min compensations.    Time  6    Period  Weeks    Status  On-going   07/13/18:  95*     OT SHORT TERM GOAL #4   Title  Pt will perfrom all basic ADLS modified independently.    Time  6    Period  Weeks    Status  Achieved   07/13/18     OT SHORT TERM GOAL #5   Title  Pt will increase RUE grip strength to 35 lbs of greater for increased functional use.    Time  6    Period   Weeks    Status  On-going   07/13/18:  2lbs     OT SHORT TERM GOAL #6   Title  Pt will report using his RUE to assist with ADLs at least 50% of the time or greater.    Time  12    Period  Weeks    Status  Achieved   07/13/18: met per pt report       OT Long Term Goals - 05/27/18 1326      OT LONG TERM GOAL #1   Title  Pt will demonstrate ability to retrieve a 3 lbs weight from overhead shelf demonstrating good control with RUE.    Time  12    Period  Weeks    Status  New    Target Date  08/25/18      OT LONG TERM GOAL #2   Title  Pt will perform mod complex home managment modified independently.    Time  12    Period  Weeks    Status  New      OT LONG TERM GOAL #3   Title  Pt will demonstrate improved RUE fine motor coordination for ADLS as evidenced by completing 9 hole peg test in 38 secs or less.    Time  12    Period  Weeks    Status  New      OT LONG TERM GOAL #4   Title  Pt will report that he has returned to using his RUE as dominant hand at least 75% of the time for ADLs/IADLs..    Time  12    Period  Weeks    Status  New      OT LONG TERM GOAL #5   Title  Pt will demonstrate ability to write his name and address legibily using RUE.    Time  12    Period  Weeks    Status  New      Long Term Additional Goals   Additional Long Term Goals  Yes      OT LONG TERM GOAL #6  Title  Pt will perform simulated work activities modified indpendently demonstrating good safety awareness.    Time  12    Period  Weeks    Status  New            Plan - 07/28/18 1007    Clinical Impression Statement  Pt is progressing towads goals. He demonstreates improved shoulder pain and ROM. Pt continues to demonstrate significant hand pain.    Occupational performance deficits (Please refer to evaluation for details):  ADL's;IADL's;Work;Leisure;Social Participation    Body Structure / Function / Physical Skills   ADL;Dexterity;Flexibility;ROM;Strength;IADL;FMC;Coordination;Balance;UE functional use;GMC;Gait;Endurance;Decreased knowledge of precautions;Sensation    Rehab Potential  Good    Comorbidities Affecting Occupational Performance:  May have comorbidities impacting occupational performance    OT Frequency  2x / week    OT Duration  8 weeks    OT Treatment/Interventions  Self-care/ADL training;Cryotherapy;Paraffin;Therapeutic exercise;DME and/or AE instruction;Functional Mobility Training;Balance training;Splinting;Manual Therapy;Neuromuscular education;Fluidtherapy;Ultrasound;Aquatic Therapy;Electrical Stimulation;Moist Heat;Contrast Bath;Energy conservation;Passive range of motion;Therapeutic activities;Patient/family education    Plan  continue to monitor pain, neuro re-ed, ?splinting for spasticity/pain     Consulted and Agree with Plan of Care  Patient;Family member/caregiver    Family Member Consulted  dtr who interprets for him       Patient will benefit from skilled therapeutic intervention in order to improve the following deficits and impairments:  Body Structure / Function / Physical Skills  Visit Diagnosis: Muscle weakness (generalized)  Unsteadiness on feet  Hemiplegia and hemiparesis following cerebral infarction affecting right dominant side (HCC)  Abnormal posture  Other lack of coordination    Problem List Patient Active Problem List   Diagnosis Date Noted  . Left pontine cerebrovascular accident ([facility]) 05/11/2018  . Tobacco abuse   . Uncontrolled type 2 diabetes mellitus with hyperglycemia ([facility])   . CVA (cerebral vascular accident) ([facility] [place]) 05/06/2018  . Hyperlipidemia 05/30/2017    RINE,KATHRYN 07/28/2018, 10:10 AM  [facility] [address] [place], [place], [address] Phone: [phone]   Fax:  [phone]  Name: MARTINO TOMPSON MRN: [id number] Date of Birth: April 26, 1965

## 2018-07-30 ENCOUNTER — Other Ambulatory Visit: Payer: Self-pay

## 2018-07-30 ENCOUNTER — Ambulatory Visit: Payer: Commercial Indemnity | Admitting: Occupational Therapy

## 2018-07-30 ENCOUNTER — Encounter: Payer: Self-pay | Admitting: Physical Therapy

## 2018-07-30 ENCOUNTER — Ambulatory Visit: Payer: Self-pay | Admitting: Physical Therapy

## 2018-07-30 ENCOUNTER — Ambulatory Visit: Payer: Commercial Indemnity | Admitting: Physical Therapy

## 2018-07-30 DIAGNOSIS — I69351 Hemiplegia and hemiparesis following cerebral infarction affecting right dominant side: Secondary | ICD-10-CM

## 2018-07-30 DIAGNOSIS — R2689 Other abnormalities of gait and mobility: Secondary | ICD-10-CM

## 2018-07-30 DIAGNOSIS — M6281 Muscle weakness (generalized): Secondary | ICD-10-CM | POA: Diagnosis not present

## 2018-07-30 DIAGNOSIS — R2681 Unsteadiness on feet: Secondary | ICD-10-CM

## 2018-07-30 DIAGNOSIS — R278 Other lack of coordination: Secondary | ICD-10-CM

## 2018-07-30 DIAGNOSIS — R293 Abnormal posture: Secondary | ICD-10-CM

## 2018-07-30 NOTE — Therapy (Signed)
[facility] [address] [place], [place], [address] Phone: [phone]   Fax:  [phone]  Occupational Therapy Treatment  Patient Details  Name: ORIAN FIGUEIRA MRN: [id number] Date of Birth: [birth year] No data recorded  Encounter Date: 07/30/2018  OT End of Session - 07/30/18 1157    Visit Number  17    Number of Visits  33    Date for OT Re-Evaluation  08/25/18    Authorization Type  [employer]    Authorization Time Period  12 weeks, 60 visit limit, counts as 1 visit if PT/OTsame day    Authorization - Visit Number  26    Authorization - Number of Visits  60    OT Start Time  1146    OT Stop Time  1230    OT Time Calculation (min)  44 min    Activity Tolerance  Patient tolerated treatment well    Behavior During Therapy  WFL for tasks assessed/performed       Past Medical History:  Diagnosis Date  . Diabetes mellitus without complication ([place])   . Hyperlipidemia     Past Surgical History:  Procedure Laterality Date  . ANKLE SURGERY      There were no vitals filed for this visit.  Subjective Assessment - 07/30/18 1158    Subjective   Pt reports pain in hand in middle of night    Limitations  non English speaking- MD gave pt shoulder injection and recommended limiting shoulder flexion to approx 90* fo 1 week, pt was diagnosed with frzen shoulder and shaoulder hand syndrome by Dr. Letta Pate,    Patient Stated Goals  recover use of RUE    Currently in Pain?  Yes    Pain Score  3     Pain Location  Shoulder    Pain Orientation  Right    Pain Descriptors / Indicators  Aching    Pain Type  Chronic pain    Pain Onset  More than a month ago    Pain Frequency  Intermittent    Aggravating Factors   malpositioning    Pain Relieving Factors  repositioning    Multiple Pain Sites  Yes    Pain Location  Hand    Pain Orientation  Right    Pain Descriptors / Indicators  Aching    Pain Type  Acute pain    Pain  Onset  More than a month ago    Pain Frequency  Intermittent    Aggravating Factors   movement    Pain Relieving Factors  repositioning              Treatment: Pt was seen in the clinic today with staff and pt's  performing universal masking, and pt maskedwith a reduced number of pt's in the clinic due to COVID-19.Fluidotherapy to right hand x10 mins for pain and hypersensitivity with low temp hotpack on R shoulder.No adverse reactions Supine, closed chain shoulder flexion, chest press, and internal extension rotation, min v.c for proper positioning. Pt was noted to have pain and area of tightness in R deltoid/ upper arm, [place] !MHZ, 0.8 w/Cm 2 continuous x 8 mins for pain relief, pt reports decreased pain following performance. Seated mid range closed chain shoulder flexion, abduction and internal external rotation with therapist facilitating  proper positioning. Wall slides within pain free ROM, min v.c / faciltation               OT Short Term  Goals - 07/21/18 1452      OT SHORT TERM GOAL #1   Title  I with inital HEP.    Time  6    Period  Weeks    Status  Achieved    Target Date  07/11/18      OT SHORT TERM GOAL #2   Title  Pt will demonstrate  improved RUE functional use as evidenced by increasing box/ blocks score by 8 blocks.    Baseline  RUE 48, LUE 75    Time  6    Period  Weeks    Status  On-going      OT SHORT TERM GOAL #3   Title  Pt will demonstrate ability to retrieve a light weight object from overhead shelf at 120 shoulder flexion with no more than min compensations.    Time  6    Period  Weeks    Status  On-going   07/13/18:  95*     OT SHORT TERM GOAL #4   Title  Pt will perfrom all basic ADLS modified independently.    Time  6    Period  Weeks    Status  Achieved   07/13/18     OT SHORT TERM GOAL #5   Title  Pt will increase RUE grip strength to 35 lbs of greater for increased functional use.    Time  6    Period  Weeks    Status   On-going   07/13/18:  2lbs     OT SHORT TERM GOAL #6   Title  Pt will report using his RUE to assist with ADLs at least 50% of the time or greater.    Time  12    Period  Weeks    Status  Achieved   07/13/18: met per pt report       OT Long Term Goals - 05/27/18 1326      OT LONG TERM GOAL #1   Title  Pt will demonstrate ability to retrieve a 3 lbs weight from overhead shelf demonstrating good control with RUE.    Time  12    Period  Weeks    Status  New    Target Date  08/25/18      OT LONG TERM GOAL #2   Title  Pt will perform mod complex home managment modified independently.    Time  12    Period  Weeks    Status  New      OT LONG TERM GOAL #3   Title  Pt will demonstrate improved RUE fine motor coordination for ADLS as evidenced by completing 9 hole peg test in 38 secs or less.    Time  12    Period  Weeks    Status  New      OT LONG TERM GOAL #4   Title  Pt will report that he has returned to using his RUE as dominant hand at least 75% of the time for ADLs/IADLs..    Time  12    Period  Weeks    Status  New      OT LONG TERM GOAL #5   Title  Pt will demonstrate ability to write his name and address legibily using RUE.    Time  12    Period  Weeks    Status  New      Long Term Additional Goals   Additional Long Term Goals  Yes  OT LONG TERM GOAL #6   Title  Pt will perform simulated work activities modified indpendently demonstrating good safety awareness.    Time  12    Period  Weeks    Status  New            Plan - 07/30/18 1653    Clinical Impression Statement  Pt is progressing towads goals. He demonstreates improved shoulder pain and ROM. Pt continues to demonstrate intermittent episodes of significant hand pain.    Occupational performance deficits (Please refer to evaluation for details):  ADL's;IADL's;Work;Leisure;Social Participation    Body Structure / Function / Physical Skills   ADL;Dexterity;Flexibility;ROM;Strength;IADL;FMC;Coordination;Balance;UE functional use;GMC;Gait;Endurance;Decreased knowledge of precautions;Sensation    Rehab Potential  Good    OT Frequency  2x / week    OT Duration  8 weeks    OT Treatment/Interventions  Self-care/ADL training;Cryotherapy;Paraffin;Therapeutic exercise;DME and/or AE instruction;Functional Mobility Training;Balance training;Splinting;Manual Therapy;Neuromuscular education;Fluidtherapy;Ultrasound;Aquatic Therapy;Electrical Stimulation;Moist Heat;Contrast Bath;Energy conservation;Passive range of motion;Therapeutic activities;Patient/family education    Plan  continue modalities to decrease pain, check box/ blocks    Consulted and Agree with Plan of Care  Patient;Family member/caregiver       Patient will benefit from skilled therapeutic intervention in order to improve the following deficits and impairments:  Body Structure / Function / Physical Skills  Visit Diagnosis: Muscle weakness (generalized)  Hemiplegia and hemiparesis following cerebral infarction affecting right dominant side (HCC)  Abnormal posture  Other lack of coordination  Other abnormalities of gait and mobility    Problem List Patient Active Problem List   Diagnosis Date Noted  . Left pontine cerebrovascular accident ([place]) 05/11/2018  . Tobacco abuse   . Uncontrolled type 2 diabetes mellitus with hyperglycemia ([place])   . CVA (cerebral vascular accident) ([place]) 05/06/2018  . Hyperlipidemia 05/30/2017    Koren Sermersheim 07/30/2018, 4:55 PM  [facility] [address] [place], [place], [address] Phone: [phone]   Fax:  [phone]  Name: NAVRAJ DREIBELBIS MRN: [id number] Date of Birth: 28-Nov-1965

## 2018-07-30 NOTE — Therapy (Signed)
[facility] [address] [place], [place], [address] Phone: [phone]   Fax:  [phone]  Physical Therapy Treatment  Patient Details  Name: Arthur Howard MRN: [id number] Date of Birth: [birth year] Referring Provider (PT): Angiulli, Chapman Fitch (from hospital-f/u to be with Sherald Barge, NP)   CLINIC OPERATION CHANGES: [facility] is operating at a low capacity due to COVID-19.  The patient was brought into the clinic for evaluation and/or treatment following  universal masking by staff, social distancing, and <10 people in the clinic.  The patient's COVID risk of complications score is 3.  Encounter Date: 07/30/2018  PT End of Session - 07/30/18 2124    Visit Number  18    Number of Visits  22    Date for PT Re-Evaluation  08/25/18    Authorization Type  [employer]-60 visits combined PT and OT if on same day    Authorization - Visit Number  18    Authorization - Number of Visits  60    PT Start Time  6256    PT Stop Time  1147    PT Time Calculation (min)  42 min    Activity Tolerance  Patient tolerated treatment well    Behavior During Therapy  WFL for tasks assessed/performed       Past Medical History:  Diagnosis Date  . Diabetes mellitus without complication ([place])   . Hyperlipidemia     Past Surgical History:  Procedure Laterality Date  . ANKLE SURGERY      There were no vitals filed for this visit.  Subjective Assessment - 07/30/18 2057    Subjective  Pt reports Rt wrist and fingers hurt severely at midnight last night- was 9/10 intensity and pt states the pain radiated up into his Rt forearm; pt states he changed position of his arm and this alleviated it     Patient is accompained by:  Family member   daughter present to interpret   Patient Stated Goals  Pt's goal is to improve R hand, arm, leg strength.                       [facility]  Treatment/Exercise - 07/30/18 1123      Therapeutic Activites    Therapeutic Activities  Other Therapeutic Activities   pt practiced ascending/descending ladder with CGA  3 reps      Knee/Hip Exercises: Stretches   Active Hamstring Stretch  Right;30 seconds;Left;1 rep   Runner's stretch - performed on steps   Gastroc Stretch  Right;30 seconds;Left;1 rep   Rt heel off edge of 6" step      Knee/Hip Exercises: Aerobic   Elliptical  Level 2.0 1" forward and 1" backward - 15 sec rest between each       Knee/Hip Exercises: Standing   Heel Raises  Both;1 set;10 reps;3 seconds   RLE only 10 reps - standing on floor with UE support on rail   Forward Step Up  Right;2 sets;10 reps;Step Height: 8"    Step Down  Right;2 sets;10 reps;Hand Hold: 1;Step Height: 8"    Other Standing Knee Exercises  Rt unilateral heel raise 2 sets 10 reps       Pt performed standing Rt knee flexion with green theraband 10 reps; with 5# weight in standing 10 reps  Pt performed standing Rt hip extension with knee flexed at 90 degrees with 3# weight 2 sets 10 reps  Pt performed resisted amb. With green theraband around ankles - forward on tip toes, backwards with large steps (not on tip toes) 25' x 1 rep each position; legs abducted 25' x1 rep and then 1 rep on tip toes with legs abducted 25'   Pt performed stepping forward/back on incline with LLE and then down/back on decline with LLE for Rt closed chain strengthening 5 reps each      PT Education - 07/30/18 2123    Education Details  instructed pt to add on 5" amb. time to current walking program (pt states he is walking 25"-30")     Person(s) Educated  Patient;Child(ren)    Methods  Explanation    Comprehension  Verbalized understanding       PT Short Term Goals - 06/23/18 1106      PT SHORT TERM GOAL #1   Title  Pt will be independent with HEP for improved transfers, balance, gait and strength.  TARGET 06/25/2018    Baseline  06/23/18: met with current  program    Status  Achieved    Target Date  06/25/18      PT SHORT TERM GOAL #2   Title  Pt will improve 5x sit<>stand to less than or equal to 12.5 seconds for improved transfer efficiency and lower extremity strength.    Baseline  06/23/18: 9.90 sec's no UE support from standard height surface    Time  --    Period  --    Status  Achieved    Target Date  06/25/18      PT SHORT TERM GOAL #3   Title  Pt will improve TUG score to less than or equal to 13.5 sec for decreased fall risk.    Baseline  06/23/18: 7.13 sec's no device except for right AFO    Time  --    Period  --    Status  Achieved    Target Date  06/25/18      PT SHORT TERM GOAL #4   Title  Pt will improve DGI score to at least 17/24 for decreased fall risk.    Baseline  06/23/18: 19/24 scored today    Time  --    Period  --    Status  Achieved    Target Date  06/25/18      PT SHORT TERM GOAL #5   Title  Pt will negotiate 4 steps with alternating pattern, 1 rail modified independently, for preparation for return to work activities.    Baseline  06/23/18: met today    Time  --    Period  --    Status  Achieved    Target Date  06/25/18      PT SHORT TERM GOAL #6   Title  Pt will verbalize understanding of fall prevention in home environment.    Baseline  06/23/18: met today    Time  --    Period  --    Status  Achieved    Target Date  06/25/18        PT Long Term Goals - 07/23/18 1608      PT LONG TERM GOAL #1   Title  The patient will be independent with progression of HEP for discharge.    Time  4    Period  Weeks    Target Date  08/22/18      PT LONG TERM GOAL #2   Title  The patient will demonstrate return to climbing an  A frame ladder with ability to balance, step up with R LE for return to household and work activities.    Time  4    Period  Weeks    Target Date  08/22/18      PT LONG TERM GOAL #3   Title  The patient will be able to walk > 30 minutes without use of AFO without loss of balance.     Time  4    Period  Weeks    Target Date  08/22/18      PT LONG TERM GOAL #4   Title  The patient will be able to return to yard work activities (discusses wanting to mow the yard-- fairly level surface, however this may depend more on R shoulder pain than balance/gait).      Time  4    Period  Weeks    Target Date  08/22/18      PT LONG TERM GOAL #5   Title  The patient will be able to walk while carrying items (grocery bags, household items, etc).      Time  4    Period  Weeks    Target Date  08/22/18            Plan - 07/30/18 2125    Clinical Impression Statement  Pt progressing towards goals with increasing strength in RLE, however, pt has decreased muscle endurance in RLE as pt reports fatigue after strengthening exercises with increased gait deviations noted in gait pattern.  Pt had no LOB with ladder negotiation, using a step over step pattern with ascension and step by step sequence with descension.                                                            Personal Factors and Comorbidities  Comorbidity 2;Past/Current Experience    Comorbidities  hyperlipidemia, DM    Examination-Activity Limitations  Carry;Stairs;Locomotion Level;Transfers    Examination-Participation Restrictions  Community Activity;Yard Work;Other    Stability/Clinical Decision Making  Evolving/Moderate complexity    Rehab Potential  Good    PT Frequency  2x / week    PT Duration  8 weeks    PT Treatment/Interventions  ADLs/Self Care Home Management;Electrical Stimulation;DME Instruction;Gait training;Stair training;Functional mobility training;Therapeutic activities;Therapeutic exercise;Orthotic Fit/Training;Patient/family education;Neuromuscular re-education;Balance training;Manual techniques;Spinal Manipulations    PT Next Visit Plan  Continue RLE strengthening - Rt hamstrings and gastrocs especially; cont high level balance training    PT Home Exercise Plan  Access Code: [email or web address] , plus gastroc  stretch    Consulted and Agree with Plan of Care  Patient;Family member/caregiver    Family Member Consulted  Daughter       Patient will benefit from skilled therapeutic intervention in order to improve the following deficits and impairments:  Abnormal gait, Decreased balance, Decreased mobility, Difficulty walking, Decreased strength, Impaired flexibility, Impaired tone, Postural dysfunction  Visit Diagnosis: Muscle weakness (generalized)  Unsteadiness on feet     Problem List Patient Active Problem List   Diagnosis Date Noted  . Left pontine cerebrovascular accident ([place]) 05/11/2018  . Tobacco abuse   . Uncontrolled type 2 diabetes mellitus with hyperglycemia ([place])   . CVA (cerebral vascular accident) ([place]) 05/06/2018  . Hyperlipidemia 05/30/2017    Alda Lea, PT 07/30/2018, 9:37 PM  [facility] [address] [place], [place], [address] Phone: [phone]   Fax:  [phone]  Name: HILERY WINTLE MRN: [id number] Date of Birth: 04-08-1965

## 2018-08-02 ENCOUNTER — Encounter: Payer: Self-pay | Admitting: Physical Medicine & Rehabilitation

## 2018-08-02 ENCOUNTER — Encounter: Payer: Commercial Indemnity | Attending: Registered Nurse | Admitting: Physical Medicine & Rehabilitation

## 2018-08-02 ENCOUNTER — Other Ambulatory Visit: Payer: Self-pay

## 2018-08-02 VITALS — Ht 65.0 in | Wt 155.0 lb

## 2018-08-02 DIAGNOSIS — I6350 Cerebral infarction due to unspecified occlusion or stenosis of unspecified cerebral artery: Secondary | ICD-10-CM | POA: Diagnosis not present

## 2018-08-02 DIAGNOSIS — M8909 Algoneurodystrophy, multiple sites: Secondary | ICD-10-CM

## 2018-08-02 DIAGNOSIS — E7849 Other hyperlipidemia: Secondary | ICD-10-CM | POA: Insufficient documentation

## 2018-08-02 DIAGNOSIS — I69351 Hemiplegia and hemiparesis following cerebral infarction affecting right dominant side: Secondary | ICD-10-CM | POA: Diagnosis not present

## 2018-08-02 DIAGNOSIS — E1165 Type 2 diabetes mellitus with hyperglycemia: Secondary | ICD-10-CM | POA: Insufficient documentation

## 2018-08-02 DIAGNOSIS — I639 Cerebral infarction, unspecified: Secondary | ICD-10-CM

## 2018-08-02 MED ORDER — TRAMADOL HCL 50 MG PO TABS: 50.0000 mg | ORAL_TABLET | Freq: Every day | ORAL | 0 refills | Status: DC

## 2018-08-02 MED ORDER — PREDNISONE 10 MG PO TABS: ORAL_TABLET | ORAL | 0 refills | Status: DC

## 2018-08-02 MED FILL — traMADol HCL 50 MG TABS: 50 | 20 days supply | Qty: 20 | Fill #0

## 2018-08-02 NOTE — Progress Notes (Signed)
 Subjective:    Patient ID: Arthur Howard, male    DOB: 09/22/1965, 52 y.o.   MRN: [id number] Consent for Web ex HPI  Patient present in room with his daughter who is assisting with translation. Audio and video are working  Patient follows up for right shoulder hand syndrome.  He has a history of left pontine infarct causing right spastic hemiplegia. The patient has been receiving outpatient PT and OT services. He continues to complain of primarily right hand pain this radiates into the forearm.  This improved after the prednisone burst and taper while he was on the higher doses but has just finished today and pain was returning over the last few days. His shoulder pain is somewhat better although the main area now is up in the trapezius area.  The patient did not report any falls or new trauma to the shoulder area. PT is performing strengthening exercises with Thera bands.  Also working on balance has met goal for timed up and go.  Also working on ambulation without AFO.  Patient working on carrying items while ambulating as well.  Working on ladder use  OT working on reaching activities, he has achieved modified independence with his basic ADLs. Working on grip strength, working on complex ADLs for home management also working on fine motor tasks Pain Inventory Average Pain 10 Pain Right Now 10 My pain is tingling  In the last 24 hours, has pain interfered with the following? General activity 10 Relation with others 10 Enjoyment of life 10 What TIME of day is your pain at its worst? night Sleep (in general) Poor  Pain is worse with: unsure Pain improves with: medication and injections Relief from Meds: 0  Mobility Do you have any goals in this area?  yes  Function disabled: date disabled na I need assistance with the following:  meal prep and household duties  Neuro/Psych weakness tingling depression  Prior Studies Any changes since last visit?  no  Physicians  involved in your care Any changes since last visit?  no   Family History  Problem Relation Age of Onset  . Diabetes Sister   . Diabetes Brother   . CVA Neg Hx    Social History   Socioeconomic History  . Marital status: Married    Spouse name: Not on file  . Number of children: Not on file  . Years of education: Not on file  . Highest education level: Not on file  Occupational History  . Occupation: insulation    Comment: [employer]  Social Needs  . Financial resource strain: Not on file  . Food insecurity:    Worry: Not on file    Inability: Not on file  . Transportation needs:    Medical: Not on file    Non-medical: Not on file  Tobacco Use  . Smoking status: Former Smoker  . Smokeless tobacco: Never Used  . Tobacco comment: never a daily smoker  Substance and Sexual Activity  . Alcohol use: No    Alcohol/week: 0.0 standard drinks  . Drug use: No  . Sexual activity: Not on file  Lifestyle  . Physical activity:    Days per week: Not on file    Minutes per session: Not on file  . Stress: Not on file  Relationships  . Social connections:    Talks on phone: Not on file    Gets together: Not on file    Attends religious service: Not on file      Active member of club or organization: Not on file    Attends meetings of clubs or organizations: Not on file    Relationship status: Not on file  Other Topics Concern  . Not on file  Social History Narrative   Works with insulation. Lives in [place]. Moved from [place] 35 years ago. He is married. Has 7 children (all girls).    Past Surgical History:  Procedure Laterality Date  . ANKLE SURGERY     Past Medical History:  Diagnosis Date  . Diabetes mellitus without complication (HCC)   . Hyperlipidemia    Ht 5' 5" (1.651 m)   Wt 155 lb (70.3 kg)   BMI 25.79 kg/m   Opioid Risk Score:   Fall Risk Score:  `1  Depression screen PHQ 2/9  Depression screen PHQ 2/9 08/02/2018 07/05/2018 05/26/2018 05/30/2017 09/20/2016  09/18/2016 03/04/2016  Decreased Interest 0 0 0 0 0 0 0  Down, Depressed, Hopeless 0 0 0 0 0 0 0  PHQ - 2 Score 0 0 0 0 0 0 0     Review of Systems  Constitutional: Negative.   HENT: Negative.   Eyes: Negative.   Respiratory: Negative.   Cardiovascular: Negative.   Gastrointestinal: Negative.   Endocrine: Negative.   Genitourinary: Negative.   Musculoskeletal: Positive for myalgias.       Shoulder pain down to fingers  Skin: Negative.   Neurological: Positive for weakness.       Tingling  Psychiatric/Behavioral: Negative.   All other systems reviewed and are negative.      Objective:   Physical Exam WebEx visit Mood and affect without lability agitation Speech difficult to assess secondary to language barrier no obvious dysarthria Will raise right upper extremity to approximately 90 degrees.  Good active range of motion at the elbow.  He is able to flex PIP MCP DIP to approximately 45 degrees each There is some swelling noted in the right hand.       Assessment & Plan:  1.  History of left pontine infarct with residual right-sided weakness.  Overall doing well from a functional standpoint  #2.  Right shoulder hand syndrome this is interfering with his functional improvements.  We discussed that this is a difficult diagnosis to manage.  He has had minimal relief from his first prednisone Dosepak, we discussed that I would recommend repeating this and I have sent to prescription to his pharmacy. We discussed that if his second round of steroids is not particularly effective we may discuss stellate ganglion blocks.  This would have to be done at another clinic.  3.  Right adhesive capsulitis his shoulder pain seems to have diminished after the last injection.  If he has some residual pain may need to repeat injection perhaps using ultrasound guidance to further guide the needle.  He also points to his trapezius as a source of pain this may be his substitution pattern and in  fact may benefit from trigger point injection. Plan continue gabapentin 300 mg BID Add Tramadol 50mg qhs to help with nocturnal pain RTC 2 wks    Duration of Web ex visit 11min 

## 2018-08-03 ENCOUNTER — Telehealth: Payer: Self-pay

## 2018-08-03 NOTE — Telephone Encounter (Signed)
If daughter Lattie Haw calls back we need verbal consent to do video visit and to file insurance for her father. This is due to the COVID 19 pandemic. I need to know if she has a smart cell phone with a camera and also need her email address. I also need verbal consent from her to waive interpreter services  for spanish.

## 2018-08-04 ENCOUNTER — Ambulatory Visit: Payer: Self-pay | Admitting: Physical Therapy

## 2018-08-04 ENCOUNTER — Ambulatory Visit: Payer: Commercial Indemnity | Admitting: Occupational Therapy

## 2018-08-04 ENCOUNTER — Ambulatory Visit: Payer: Commercial Indemnity | Admitting: Physical Therapy

## 2018-08-05 ENCOUNTER — Inpatient Hospital Stay: Payer: Commercial Indemnity | Admitting: Adult Health

## 2018-08-06 ENCOUNTER — Encounter: Payer: Self-pay | Admitting: Physical Therapy

## 2018-08-06 ENCOUNTER — Ambulatory Visit: Payer: Commercial Indemnity | Admitting: Physical Therapy

## 2018-08-06 ENCOUNTER — Other Ambulatory Visit: Payer: Self-pay

## 2018-08-06 ENCOUNTER — Ambulatory Visit: Payer: Self-pay | Admitting: Physical Therapy

## 2018-08-06 ENCOUNTER — Ambulatory Visit: Payer: Commercial Indemnity | Admitting: Occupational Therapy

## 2018-08-06 DIAGNOSIS — M6281 Muscle weakness (generalized): Secondary | ICD-10-CM | POA: Diagnosis not present

## 2018-08-06 DIAGNOSIS — R293 Abnormal posture: Secondary | ICD-10-CM

## 2018-08-06 DIAGNOSIS — I69351 Hemiplegia and hemiparesis following cerebral infarction affecting right dominant side: Secondary | ICD-10-CM

## 2018-08-06 DIAGNOSIS — R278 Other lack of coordination: Secondary | ICD-10-CM

## 2018-08-06 DIAGNOSIS — R2689 Other abnormalities of gait and mobility: Secondary | ICD-10-CM

## 2018-08-06 NOTE — Patient Instructions (Addendum)
Axial Extension (Chin Tuck)    Pull chin in and lengthen back of neck. Hold _3___ seconds while counting out loud. Repeat _10___ times. Do __1-2__ sessions per day.  http://gt2.exer.us/449   Copyright  VHI. All rights reserved.   Provided picture of anterior/posterior pelvic tilts, with neutral spine posture for more upright sitting.

## 2018-08-06 NOTE — Therapy (Signed)
[facility] [address] [place], [place], [address] Phone: [phone]   Fax:  [phone]  Occupational Therapy Treatment  Patient Details  Name: Arthur Howard MRN: [id number] Date of Birth: 05-17-1965 No data recorded  Encounter Date: 08/06/2018  OT End of Session - 08/06/18 1225    Visit Number  18    Number of Visits  33    Date for OT Re-Evaluation  08/25/18    Authorization Type  [employer]    Authorization Time Period  12 weeks, 60 visit limit, counts as 1 visit if PT/OTsame day    Authorization - Visit Number  18    Authorization - Number of Visits  57    OT Start Time  1151    OT Stop Time  1231    OT Time Calculation (min)  40 min    Activity Tolerance  Patient tolerated treatment well    Behavior During Therapy  WFL for tasks assessed/performed       Past Medical History:  Diagnosis Date  . Diabetes mellitus without complication ([place])   . Hyperlipidemia     Past Surgical History:  Procedure Laterality Date  . ANKLE SURGERY      There were no vitals filed for this visit.  Subjective Assessment - 08/06/18 1232    Limitations  non English speaking- MD gave pt shoulder injection and recommended limiting shoulder flexion to approx 90* fo 1 week, pt was diagnosed with frzen shoulder and shaoulder hand syndrome by Dr. Letta Pate,    Currently in Pain?  Yes    Pain Score  6     Pain Location  Arm   hand   Pain Orientation  Right    Pain Descriptors / Indicators  Aching    Pain Type  Chronic pain    Pain Onset  More than a month ago    Pain Frequency  Intermittent    Aggravating Factors   malpositioning    Pain Relieving Factors  repositioning             Treatment: Pt was seen in the clinic today with staff and pt's performing universal masking, with a reduced number of pt's in the clinic due to COVID-19 Pt was noted to have pain and area of tightness in R deltoid/ upper arm, [place] 1MHZ, 0.8  w/Cm 2 continuous x 8 mins for pain relief, pt reports decreased pain following performance. Supine, closed chain shoulder flexion, chest press,, min v.c for proper positioning. Standing mid range- high range  unilateral  shoulder flexion with UE ranger  with therapist facilitating  proper positioning. Prone shoulder extension x 10 reps min v.c, prone on elbows lifting chest x 10 reps min v.c Tall kneeling rolling ball forwards and backwards, min facilitation. Fluidotherapy to right hand with pt performing A/ROM x10 mins for pain and hypersensitivity.No adverse reactions               OT Short Term Goals - 07/21/18 1452      OT SHORT TERM GOAL #1   Title  I with inital HEP.    Time  6    Period  Weeks    Status  Achieved    Target Date  07/11/18      OT SHORT TERM GOAL #2   Title  Pt will demonstrate  improved RUE functional use as evidenced by increasing box/ blocks score by 8 blocks.    Baseline  RUE 48, LUE 75  Time  6    Period  Weeks    Status  On-going      OT SHORT TERM GOAL #3   Title  Pt will demonstrate ability to retrieve a light weight object from overhead shelf at 120 shoulder flexion with no more than min compensations.    Time  6    Period  Weeks    Status  On-going   07/13/18:  95*     OT SHORT TERM GOAL #4   Title  Pt will perfrom all basic ADLS modified independently.    Time  6    Period  Weeks    Status  Achieved   07/13/18     OT SHORT TERM GOAL #5   Title  Pt will increase RUE grip strength to 35 lbs of greater for increased functional use.    Time  6    Period  Weeks    Status  On-going   07/13/18:  2lbs     OT SHORT TERM GOAL #6   Title  Pt will report using his RUE to assist with ADLs at least 50% of the time or greater.    Time  12    Period  Weeks    Status  Achieved   07/13/18: met per pt report       OT Long Term Goals - 05/27/18 1326      OT LONG TERM GOAL #1   Title  Pt will demonstrate ability to retrieve a 3 lbs  weight from overhead shelf demonstrating good control with RUE.    Time  12    Period  Weeks    Status  New    Target Date  08/25/18      OT LONG TERM GOAL #2   Title  Pt will perform mod complex home managment modified independently.    Time  12    Period  Weeks    Status  New      OT LONG TERM GOAL #3   Title  Pt will demonstrate improved RUE fine motor coordination for ADLS as evidenced by completing 9 hole peg test in 38 secs or less.    Time  12    Period  Weeks    Status  New      OT LONG TERM GOAL #4   Title  Pt will report that he has returned to using his RUE as dominant hand at least 75% of the time for ADLs/IADLs..    Time  12    Period  Weeks    Status  New      OT LONG TERM GOAL #5   Title  Pt will demonstrate ability to write his name and address legibily using RUE.    Time  12    Period  Weeks    Status  New      Long Term Additional Goals   Additional Long Term Goals  Yes      OT LONG TERM GOAL #6   Title  Pt will perform simulated work activities modified indpendently demonstrating good safety awareness.    Time  12    Period  Weeks    Status  New            Plan - 08/06/18 1226    Clinical Impression Statement  Pt is progressing slowly towards goals, pain remains a limiting factor    Occupational performance deficits (Please refer to evaluation for details):  ADL's;IADL's;Work;Leisure;Social Participation  Body Structure / Function / Physical Skills  ADL;Dexterity;Flexibility;ROM;Strength;IADL;FMC;Coordination;Balance;UE functional use;GMC;Gait;Endurance;Decreased knowledge of precautions;Sensation    Rehab Potential  Good    OT Frequency  2x / week    OT Duration  8 weeks    OT Treatment/Interventions  Self-care/ADL training;Cryotherapy;Paraffin;Therapeutic exercise;DME and/or AE instruction;Functional Mobility Training;Balance training;Splinting;Manual Therapy;Neuromuscular education;Fluidtherapy;Ultrasound;Aquatic Therapy;Electrical  Stimulation;Moist Heat;Contrast Bath;Energy conservation;Passive range of motion;Therapeutic activities;Patient/family education    Plan  continue modalities to decrease pain, check box/ blocks    Consulted and Agree with Plan of Care  Patient;Family member/caregiver    Family Member Consulted  dtr who interprets for him       Patient will benefit from skilled therapeutic intervention in order to improve the following deficits and impairments:  Body Structure / Function / Physical Skills  Visit Diagnosis: Muscle weakness (generalized)  Hemiplegia and hemiparesis following cerebral infarction affecting right dominant side (HCC)  Abnormal posture  Other lack of coordination  Other abnormalities of gait and mobility    Problem List Patient Active Problem List   Diagnosis Date Noted  . 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 Left pontine cerebrovascular accident ([place]) 05/11/2018  . Tobacco abuse   . Uncontrolled type 2 diabetes mellitus with hyperglycemia ([place])   . CVA (cerebral vascular accident) ([place]) 05/06/2018  . Hyperlipidemia 05/30/2017    Ardella Chhim 08/06/2018, 12:50 PM  [facility] [address] [place], [place], [address] Phone: [phone]   Fax:  [phone]  Name: EMETT STAPEL MRN: [id number] Date of Birth: [birth year]

## 2018-08-07 NOTE — Therapy (Addendum)
[facility] [address] [place], [place], [address] Phone: [phone]   Fax:  [phone]  Physical Therapy Treatment  Patient Details  Name: Arthur Howard MRN: [id number] Date of Birth: 09-08-1965 Referring Provider (PT): Angiulli, Chapman Fitch (from hospital-f/u to be with Sherald Barge, NP)   Encounter Date: 08/06/2018  CLINIC OPERATION CHANGES: [facility] is operating at a low capacity due to COVID-19.  The patient was brought into the clinic for evaluation and/or treatment following universal masking by staff, social distancing, and <10 people in the clinic.  The patient's COVID risk of complications score is 3.  PT End of Session - 08/07/18 1236    Visit Number  19    Number of Visits  22    Date for PT Re-Evaluation  08/25/18    Authorization Type  [employer]-60 visits combined PT and OT if on same day    Authorization - Visit Number  19    Authorization - Number of Visits  60    PT Start Time  2951    PT Stop Time  1148    PT Time Calculation (min)  43 min    Activity Tolerance  Patient tolerated treatment well    Behavior During Therapy  WFL for tasks assessed/performed       Past Medical History:  Diagnosis Date  . Diabetes mellitus without complication ([place])   . Hyperlipidemia     Past Surgical History:  Procedure Laterality Date  . ANKLE SURGERY      There were no vitals filed for this visit.  Subjective Assessment - 08/06/18 1107    Subjective  Still having the pain in R arm and hand.  Saw Dr. Letta Pate and he plans to do another injection and changed my medication.  No falls, no stumbles.    Patient is accompained by:  Family member   daughter present to interpret   Patient Stated Goals  Pt's goal is to improve R hand, arm, leg strength.    Currently in Pain?  Yes    Pain Score  7     Pain Location  Arm   hand   Pain Orientation  Right    Pain Descriptors /  Indicators  Aching    Pain Type  Chronic pain    Pain Onset  More than a month ago    Pain Frequency  Intermittent    Aggravating Factors   malpositioning    Pain Relieving Factors  repositioning                       [facility] Adult PT Treatment/Exercise - 08/07/18 0001      High Level Balance   High Level Balance Comments  Short distance gait 20 ft x 4 reps, with cues for increased timing/coordination of RLE swing through with gait; overall cues for increased gait pace and step length      Exercises   Exercises  Other Exercises    Other Exercises   Postural exercises performed at edge of mat:  Seated anterior/posterior pelvic tilts, 2 sets x 10 reps, with tactile cues for tilts to initiate from pelvis and to decrease rounded posture through shoulders.  Seated on green balance disk:  anterior/posterior pelvic tilts x 10, then lateral pelvic tilts x 5 reps each side.  Seated on therapy ball:  attempted anterior/posterior pelvic tilts, but pt has difficulty isolating movement on ball.  Lateral weightshifting x 10 reps;  seated core stability exercises on therapy ball:  marching in place, marching with gentle alternating UE lifts x 5 reps, 2 sets; attempted LAQ x 5 reps with pt requiring cues for abdominal activation and to maintain upright posture.  Standing posture exercises at wall:  scapular squeezes x 5 reps, chin tucks 2 sets x 5 reps with towel behind head.      Knee/Hip Exercises: Standing   Wall Squat  2 sets;10 reps   Cues for adominal activation, maintain posture   Other Standing Knee Exercises  Wave squat at counter-combo of gentle squat, then up to bilateral heelraise, 2 sets x 10    Other Standing Knee Exercises  Marching in place at counter, with focus on timing/coordination of RLE high knee march (without collapse of trunk into rounded posture).  Pt requires cues for upright posture.  R hip/knee flexion to tap target (Boomwhacker in the air), then RLE into hip/knee  extension into runner's stretch position x 5 reps.              PT Education - 08/07/18 1234    Education Details  Postural additions to HEP-see instructions    Person(s) Educated  Patient;Child(ren)    Methods  Explanation;Demonstration;Handout    Comprehension  Verbalized understanding;Returned demonstration;Verbal cues required       PT Short Term Goals - 06/23/18 1106      PT SHORT TERM GOAL #1   Title  Pt will be independent with HEP for improved transfers, balance, gait and strength.  TARGET 06/25/2018    Baseline  06/23/18: met with current program    Status  Achieved    Target Date  06/25/18      PT SHORT TERM GOAL #2   Title  Pt will improve 5x sit<>stand to less than or equal to 12.5 seconds for improved transfer efficiency and lower extremity strength.    Baseline  06/23/18: 9.90 sec's no UE support from standard height surface    Time  --    Period  --    Status  Achieved    Target Date  06/25/18      PT SHORT TERM GOAL #3   Title  Pt will improve TUG score to less than or equal to 13.5 sec for decreased fall risk.    Baseline  06/23/18: 7.13 sec's no device except for right AFO    Time  --    Period  --    Status  Achieved    Target Date  06/25/18      PT SHORT TERM GOAL #4   Title  Pt will improve DGI score to at least 17/24 for decreased fall risk.    Baseline  06/23/18: 19/24 scored today    Time  --    Period  --    Status  Achieved    Target Date  06/25/18      PT SHORT TERM GOAL #5   Title  Pt will negotiate 4 steps with alternating pattern, 1 rail modified independently, for preparation for return to work activities.    Baseline  06/23/18: met today    Time  --    Period  --    Status  Achieved    Target Date  06/25/18      PT SHORT TERM GOAL #6   Title  Pt will verbalize understanding of fall prevention in home environment.    Baseline  06/23/18: met today    Time  --  Period  --    Status  Achieved    Target Date  06/25/18        PT Long  Term Goals - 07/23/18 1608      PT LONG TERM GOAL #1   Title  The patient will be independent with progression of HEP for discharge.    Time  4    Period  Weeks    Target Date  08/22/18      PT LONG TERM GOAL #2   Title  The patient will demonstrate return to climbing an A frame ladder with ability to balance, step up with R LE for return to household and work activities.    Time  4    Period  Weeks    Target Date  08/22/18      PT LONG TERM GOAL #3   Title  The patient will be able to walk > 30 minutes without use of AFO without loss of balance.    Time  4    Period  Weeks    Target Date  08/22/18      PT LONG TERM GOAL #4   Title  The patient will be able to return to yard work activities (discusses wanting to mow the yard-- fairly level surface, however this may depend more on R shoulder pain than balance/gait).      Time  4    Period  Weeks    Target Date  08/22/18      PT LONG TERM GOAL #5   Title  The patient will be able to walk while carrying items (grocery bags, household items, etc).      Time  4    Period  Weeks    Target Date  08/22/18            Plan - 08/07/18 1237    Clinical Impression Statement  Today's skilled PT session focused on postural strengthening, RLE strengthening as well as timing/coordination of RLE for gait.  Sitting and standing postural activities addressing lumbar and cervical muscle strengthening, as pt tends to sit in slumped posture; explained how this may be contributing to some of his shoulder pain(OT is more fully addressing).  Also, as noted in increased trunk flexion with marching and dynamic activities, pt may benefit from additional trunk strengthening to help with postural stability.    Personal Factors and Comorbidities  Comorbidity 2;Past/Current Experience    Comorbidities  hyperlipidemia, DM    Examination-Activity Limitations  Carry;Stairs;Locomotion Level;Transfers    Examination-Participation Restrictions  Community  Activity;Yard Work;Other    Stability/Clinical Decision Making  Evolving/Moderate complexity    Rehab Potential  Good    PT Frequency  2x / week    PT Duration  8 weeks    PT Treatment/Interventions  ADLs/Self Care Home Management;Electrical Stimulation;DME Instruction;Gait training;Stair training;Functional mobility training;Therapeutic activities;Therapeutic exercise;Orthotic Fit/Training;Patient/family education;Neuromuscular re-education;Balance training;Manual techniques;Spinal Manipulations    PT Next Visit Plan  RLE and postural strengthening; check additions to HEP; work on high level balance towards LTGs    PT Home Exercise Plan  Access Code: [email or web address] , plus gastroc stretch    Consulted and Agree with Plan of Care  Patient;Family member/caregiver    Family Member Consulted  Daughter       Patient will benefit from skilled therapeutic intervention in order to improve the following deficits and impairments:  Abnormal gait, Decreased balance, Decreased mobility, Difficulty walking, Decreased strength, Impaired flexibility, Impaired tone, Postural dysfunction  Visit Diagnosis: Abnormal posture  Muscle weakness (generalized)     Problem List Patient Active Problem List   Diagnosis Date Noted  . 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 Left pontine cerebrovascular accident ([place]) 05/11/2018  . Tobacco abuse   . Uncontrolled type 2 diabetes mellitus with hyperglycemia ([place])   . CVA (cerebral vascular accident) ([place]) 05/06/2018  . Hyperlipidemia 05/30/2017    Cerra Eisenhower W. 08/07/2018, 12:45 PM Frazier Butt., PT  [facility] [address] [place], [place], [address] Phone: [phone]   Fax:  [phone]  Name: Arthur Howard MRN: [id number] Date of Birth: July 18, 1965

## 2018-08-10 NOTE — Addendum Note (Signed)
Addended by: Rudell Cobb M on: 08/10/2018 01:22 PM   Modules accepted: Orders

## 2018-08-11 ENCOUNTER — Other Ambulatory Visit: Payer: Self-pay

## 2018-08-11 ENCOUNTER — Ambulatory Visit: Payer: Commercial Indemnity | Admitting: Occupational Therapy

## 2018-08-11 ENCOUNTER — Encounter: Payer: Self-pay | Admitting: Physical Therapy

## 2018-08-11 ENCOUNTER — Ambulatory Visit: Payer: Commercial Indemnity | Admitting: Physical Therapy

## 2018-08-11 ENCOUNTER — Ambulatory Visit: Payer: Self-pay | Admitting: Physical Therapy

## 2018-08-11 DIAGNOSIS — M6281 Muscle weakness (generalized): Secondary | ICD-10-CM | POA: Diagnosis not present

## 2018-08-11 DIAGNOSIS — R293 Abnormal posture: Secondary | ICD-10-CM

## 2018-08-11 DIAGNOSIS — I69351 Hemiplegia and hemiparesis following cerebral infarction affecting right dominant side: Secondary | ICD-10-CM

## 2018-08-11 DIAGNOSIS — R278 Other lack of coordination: Secondary | ICD-10-CM

## 2018-08-11 DIAGNOSIS — R2689 Other abnormalities of gait and mobility: Secondary | ICD-10-CM

## 2018-08-11 NOTE — Therapy (Signed)
[facility] [address] [place], [place], [address] Phone: [phone]   Fax:  [phone]  Occupational Therapy Treatment  Patient Details  Name: Arthur Howard MRN: [id number] Date of Birth: [birth year] No data recorded  Encounter Date: 08/11/2018  OT End of Session - 08/11/18 1309    Visit Number  19    Number of Visits  33    Date for OT Re-Evaluation  08/25/18    Authorization Type  [employer]ign[employer]    Authorization Time Period  12 weeks, 60 visit limit, counts as 1 visit if PT/OTsame day    Authorization - Visit Number  80    Authorization - Number of Visits  43    OT Start Time  1104    OT Stop Time  1145    OT Time Calculation (min)  41 min    Activity Tolerance  Patient tolerated treatment well    Behavior During Therapy  WFL for tasks assessed/performed       Past Medical History:  Diagnosis Date  . Diabetes mellitus without complication ([place])   . Hyperlipidemia     Past Surgical History:  Procedure Laterality Date  . ANKLE SURGERY      There were no vitals filed for this visit.  Subjective Assessment - 08/11/18 1308    Currently in Pain?  Yes    Pain Score  6     Pain Location  Arm   hand   Pain Orientation  Right    Pain Descriptors / Indicators  Aching    Pain Type  Chronic pain    Pain Onset  More than a month ago    Pain Frequency  Intermittent    Aggravating Factors   malpositioning    Pain Relieving Factors  repositioning         Treatment: Therapist  fabricated a custom resting hand splint for pain reduction in RUE. Pt was instructed to wear for and hour or 2 initally and to check skin, if no problems to transition to night time wear. Hot pack applied to right shoulder while therapist fashioned splint.  Pt also performed scrubbing tabletop in standing, finger and wrist flexion and extension AA/ROM. Pt/ dtr verbalize understanding of splint wear, care and  precautions.                    OT Short Term Goals - 07/21/18 1452      OT SHORT TERM GOAL #1   Title  I with inital HEP.    Time  6    Period  Weeks    Status  Achieved    Target Date  07/11/18      OT SHORT TERM GOAL #2   Title  Pt will demonstrate  improved RUE functional use as evidenced by increasing box/ blocks score by 8 blocks.    Baseline  RUE 48, LUE 75    Time  6    Period  Weeks    Status  On-going      OT SHORT TERM GOAL #3   Title  Pt will demonstrate ability to retrieve a light weight object from overhead shelf at 120 shoulder flexion with no more than min compensations.    Time  6    Period  Weeks    Status  On-going   07/13/18:  95*     OT SHORT TERM GOAL #4   Title  Pt will perfrom all basic ADLS modified  independently.    Time  6    Period  Weeks    Status  Achieved   07/13/18     OT SHORT TERM GOAL #5   Title  Pt will increase RUE grip strength to 35 lbs of greater for increased functional use.    Time  6    Period  Weeks    Status  On-going   07/13/18:  2lbs     OT SHORT TERM GOAL #6   Title  Pt will report using his RUE to assist with ADLs at least 50% of the time or greater.    Time  12    Period  Weeks    Status  Achieved   07/13/18: met per pt report       OT Long Term Goals - 05/27/18 1326      OT LONG TERM GOAL #1   Title  Pt will demonstrate ability to retrieve a 3 lbs weight from overhead shelf demonstrating good control with RUE.    Time  12    Period  Weeks    Status  New    Target Date  08/25/18      OT LONG TERM GOAL #2   Title  Pt will perform mod complex home managment modified independently.    Time  12    Period  Weeks    Status  New      OT LONG TERM GOAL #3   Title  Pt will demonstrate improved RUE fine motor coordination for ADLS as evidenced by completing 9 hole peg test in 38 secs or less.    Time  12    Period  Weeks    Status  New      OT LONG TERM GOAL #4   Title  Pt will report that he  has returned to using his RUE as dominant hand at least 75% of the time for ADLs/IADLs..    Time  12    Period  Weeks    Status  New      OT LONG TERM GOAL #5   Title  Pt will demonstrate ability to write his name and address legibily using RUE.    Time  12    Period  Weeks    Status  New      Long Term Additional Goals   Additional Long Term Goals  Yes      OT LONG TERM GOAL #6   Title  Pt will perform simulated work activities modified indpendently demonstrating good safety awareness.    Time  12    Period  Weeks    Status  New            Plan - 08/11/18 1310    Clinical Impression Statement  Pt is progressing slowly towards goals, pain remains a limiting factor. 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    Occupational performance deficits (Please refer to evaluation for details):  ADL's;IADL's;Work;Leisure;Social Participation    Body Structure / Function / Physical Skills  ADL;Dexterity;Flexibility;ROM;Strength;IADL;FMC;Coordination;Balance;UE functional use;GMC;Gait;Endurance;Decreased knowledge of precautions;Sensation    Rehab Potential  Good    OT Frequency  2x / week    OT Duration  8 weeks    OT Treatment/Interventions  Self-care/ADL training;Cryotherapy;Paraffin;Therapeutic exercise;DME and/or AE instruction;Functional Mobility Training;Balance training;Splinting;Manual Therapy;Neuromuscular education;Fluidtherapy;Ultrasound;Aquatic Therapy;Electrical Stimulation;Moist Heat;Contrast Bath;Energy conservation;Passive range of motion;Therapeutic activities;Patient/family education    Plan  splint check and modifications, check box anfd blocks, NMR/ pain reduction strategies for RUE, focus on upright posture/ scapualr mobility    Consulted and Agree with Plan of Care  Patient;Family member/caregiver    Family Member Consulted  dtr who interprets for him       Patient will  benefit from skilled therapeutic intervention in order to improve the following deficits and impairments:  Body Structure / Function / Physical Skills  Visit Diagnosis: Abnormal posture  Hemiplegia and hemiparesis following cerebral infarction affecting right dominant side (HCC)  Muscle weakness (generalized)  Other lack of coordination    Problem List Patient Active Problem List   Diagnosis Date Noted  . 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 ([place]) 08/02/2018  . Left pontine cerebrovascular accident ([place]) 05/11/2018  . Tobacco abuse   . Uncontrolled type 2 diabetes mellitus with hyperglycemia ([place])   . CVA (cerebral vascular accident) ([place]) 05/06/2018  . Hyperlipidemia 05/30/2017    RINE,KATHRYN 08/11/2018, 1:14 PM  [facility] [address] [place], [place], [address] Phone: [phone]   Fax:  [phone]  Name: Arthur Howard MRN: [id number] Date of Birth: 1965-12-02

## 2018-08-11 NOTE — Patient Instructions (Signed)
Your Splint This splint should initially be fitted by a healthcare practitioner.  The healthcare practitioner is responsible for providing wearing instructions and precautions to the patient, other healthcare practitioners and care provider involved in the patient's care.  This splint was custom made for you. Please read the following instructions to learn about wearing and caring for your splint.  Precautions Should your splint cause any of the following problems, remove the splint immediately and contact your therapist/physician.  Swelling  Severe Pain  Pressure Areas  Stiffness  Numbness  Do not wear your splint while operating machinery unless it has been fabricated for that purpose.  When To Wear Your Splint Where your splint according to your therapist/physician instructions. Daytime for 1 hours today at rest if no problems try sleeping in splint, If pain, pressure areas or redness take offsplint and stop wearing, bring splint with you to next visit.  Care and Cleaning of Your Splint 1. Keep your splint away from open flames. 2. Your splint will lose its shape in temperatures over 135 degrees Farenheit, ( in car windows, near radiators, ovens or in hot water).  Never make any adjustments to your splint, if the splint needs adjusting remove it and make an appointment to see your therapist. 3. Your splint, including the cushion liner may be cleaned with soap and lukewarm water.  Do not immerse in hot water over 135 degrees Farenheit. 4. Straps may be washed with soap and water, but do not moisten the self-adhesive portion.

## 2018-08-11 NOTE — Therapy (Signed)
[facility] [address] [place], [place], [address] Phone: [phone]   Fax:  [phone]  Physical Therapy Treatment  Patient Details  Name: Arthur Howard MRN: [id number] Date of Birth: [birth year] Referring Provider (PT): Angiulli, Chapman Fitch (from hospital-f/u to be with Sherald Barge, NP)   Encounter Date: 08/11/2018   CLINIC OPERATION CHANGES: [facility] is operating at a low capacity due to COVID-19.  The patient was brought into the clinic for evaluation and/or treatment following universal masking by staff, social distancing, and <10 people in the clinic.  The patient's COVID risk of complications score is 3.   PT End of Session - 08/11/18 2150    Visit Number  20    Number of Visits  22    Date for PT Re-Evaluation  08/25/18    Authorization Type  [employer]-60 visits combined PT and OT if on same day    Authorization - Visit Number  20    Authorization - Number of Visits  60    PT Start Time  6147    PT Stop Time  1230    PT Time Calculation (min)  45 min    Activity Tolerance  Patient tolerated treatment well    Behavior During Therapy  WFL for tasks assessed/performed       Past Medical History:  Diagnosis Date  . Diabetes mellitus without complication ([place])   . Hyperlipidemia     Past Surgical History:  Procedure Laterality Date  . ANKLE SURGERY      There were no vitals filed for this visit.  Subjective Assessment - 08/11/18 1150    Subjective  Still having some RUE pain in the night time.      Patient is accompained by:  Family member   daughter present to interpret   Patient Stated Goals  Pt's goal is to improve R hand, arm, leg strength.    Currently in Pain?  Yes    Pain Onset  More than a month ago                       [facility] Adult PT Treatment/Exercise - 08/11/18 1324      Exercises   Exercises  Shoulder    Other Exercises    Attempted use of dowel behind back but pt reported significant R shoulder pain and noted to have anterior tipping of R scapula and rounding of R shoulder.  Transitioned to stretches below before returning to dowel behind back.      Shoulder Exercises: Standing   Extension  AROM;Strengthening;Both;10 reps;Other (comment)   dowel behind back   Extension Limitations  Performed with bilat UE holding dowel behind back.  Cued pt to depress shoulders and then extend UE to bring dowel away from body.  Added this activation to ambulation: cued pt to ambulate around gym while maintaining contraction for scapular depression and UE extension to promote more upright trunk.  Required cues to reduce use of cervical extension to initiate.        Shoulder Exercises: Stretch   Other Shoulder Stretches  To increase ROM in anterior R shoulder and promote shoulder extension without anterior tipping of scapula performed multiple active/dynamic stretches.  Performed modified plank with UE WB on counter top rotating and opening up to L side reaching LUE out to the L side x 8 reps, added in LLE stepping 90 deg to L with reach for increased stretch of anterior  chest mm; cued pt to push through RUE into counter for activation and stability.  Transitioned to supine on mat and performed supine mid thoracic rotation keeping RUE in 45 deg ABD with pt rotating upper trunk and lower trunk to R side and then away from R side.  Final stretch performed in prone with RUE positioned at 45 deg ABD and ER; continued to have pt rotate body to L side to continue to facilitate R anterior chest stretch and improved scapular positioning.             PT Education - 08/11/18 2149    Education Details  Stretches for R anterior chest added to HEP; trigger point dry needling for R shoulder if approved by physiatrist    Person(s) Educated  Patient;Child(ren)    Methods  Explanation;Demonstration;Handout    Comprehension  Verbalized  understanding;Returned demonstration      Access Code: [email or web address]  URL: [email or web address]  Date: 08/11/2018  Prepared by: Misty Stanley   Exercises Sidelying Mid Thoracic Rotation - 10 reps - 1 sets - 2x daily - 7x weekly Plank with Thoracic Rotation on Counter - 10 reps - 1 sets - 2x daily - 7x weekly Chest stretch on stomach - 3 sets - 20 second hold - 2x daily - 7x weekly Standing Shoulder Extension with Dowel - 10 reps - 2 sets - 2x daily - 7x weekly   PT Short Term Goals - 06/23/18 1106      PT SHORT TERM GOAL #1   Title  Pt will be independent with HEP for improved transfers, balance, gait and strength.  TARGET 06/25/2018    Baseline  06/23/18: met with current program    Status  Achieved    Target Date  06/25/18      PT SHORT TERM GOAL #2   Title  Pt will improve 5x sit<>stand to less than or equal to 12.5 seconds for improved transfer efficiency and lower extremity strength.    Baseline  06/23/18: 9.90 sec's no UE support from standard height surface    Time  --    Period  --    Status  Achieved    Target Date  06/25/18      PT SHORT TERM GOAL #3   Title  Pt will improve TUG score to less than or equal to 13.5 sec for decreased fall risk.    Baseline  06/23/18: 7.13 sec's no device except for right AFO    Time  --    Period  --    Status  Achieved    Target Date  06/25/18      PT SHORT TERM GOAL #4   Title  Pt will improve DGI score to at least 17/24 for decreased fall risk.    Baseline  06/23/18: 19/24 scored today    Time  --    Period  --    Status  Achieved    Target Date  06/25/18      PT SHORT TERM GOAL #5   Title  Pt will negotiate 4 steps with alternating pattern, 1 rail modified independently, for preparation for return to work activities.    Baseline  06/23/18: met today    Time  --    Period  --    Status  Achieved    Target Date  06/25/18      PT SHORT TERM GOAL #6   Title  Pt will verbalize understanding of fall prevention in home  environment.  Baseline  06/23/18: met today    Time  --    Period  --    Status  Achieved    Target Date  06/25/18        PT Long Term Goals - 07/23/18 1608      PT LONG TERM GOAL #1   Title  The patient will be independent with progression of HEP for discharge.    Time  4    Period  Weeks    Target Date  08/22/18      PT LONG TERM GOAL #2   Title  The patient will demonstrate return to climbing an A frame ladder with ability to balance, step up with R LE for return to household and work activities.    Time  4    Period  Weeks    Target Date  08/22/18      PT LONG TERM GOAL #3   Title  The patient will be able to walk > 30 minutes without use of AFO without loss of balance.    Time  4    Period  Weeks    Target Date  08/22/18      PT LONG TERM GOAL #4   Title  The patient will be able to return to yard work activities (discusses wanting to mow the yard-- fairly level surface, however this may depend more on R shoulder pain than balance/gait).      Time  4    Period  Weeks    Target Date  08/22/18      PT LONG TERM GOAL #5   Title  The patient will be able to walk while carrying items (grocery bags, household items, etc).      Time  4    Period  Weeks    Target Date  08/22/18            Plan - 08/11/18 2159    Clinical Impression Statement  Treatment session focused on dynamic movements in WB, supine and prone to facilitate increased activation of R peri-scapular muscles for retraction and depression and stretching of anterior chest muscles to decrease anterior tipping and pain with extension.  Also performed ambulation focusing on same mm activation to improve upright posture during gait.  Pt tolerated well and by end of session reported no pain in R shoulder with extension; also demonstrated decreased anterior tipping of scapula and more symmetrical alignment of scapula.  Will request addition of TDN to plan of care.    Personal Factors and Comorbidities   Comorbidity 2;Past/Current Experience    Comorbidities  hyperlipidemia, DM    Examination-Activity Limitations  Carry;Stairs;Locomotion Level;Transfers    Examination-Participation Restrictions  Community Activity;Yard Work;Other    Stability/Clinical Decision Making  Evolving/Moderate complexity    Rehab Potential  Good    PT Frequency  2x / week    PT Duration  8 weeks    PT Treatment/Interventions  ADLs/Self Care Home Management;Electrical Stimulation;DME Instruction;Gait training;Stair training;Functional mobility training;Therapeutic activities;Therapeutic exercise;Orthotic Fit/Training;Patient/family education;Neuromuscular re-education;Balance training;Manual techniques;Spinal Manipulations;Aquatic Therapy;Cryotherapy;Moist Heat;Passive range of motion;Dry needling    PT Next Visit Plan  TDN of R upper trap, pec minor and bicep.  Postural exercises.  Check LTG, renew?    PT Home Exercise Plan  Access Code: [email or web address] , shoulder stretches: [email or web address]    Consulted and Agree with Plan of Care  Patient;Family member/caregiver    Family Member Consulted  Daughter       Patient will benefit from skilled therapeutic  intervention in order to improve the following deficits and impairments:  Abnormal gait, Decreased balance, Decreased mobility, Difficulty walking, Decreased strength, Impaired flexibility, Impaired tone, Postural dysfunction, Pain, Increased muscle spasms, Decreased range of motion  Visit Diagnosis: Abnormal posture  Hemiplegia and hemiparesis following cerebral infarction affecting right dominant side (HCC)  Muscle weakness (generalized)  Other abnormalities of gait and mobility     Problem List Patient Active Problem List   Diagnosis Date Noted  . 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 Left pontine cerebrovascular accident ([place]) 05/11/2018  . Tobacco abuse   . Uncontrolled type 2 diabetes mellitus with hyperglycemia  ([place])   . CVA (cerebral vascular accident) ([place]) 05/06/2018  . Hyperlipidemia 05/30/2017   Rico Junker, PT, DPT 08/11/18    10:06 PM    [facility] [address] [place], [place], [address] Phone: [phone]   Fax:  [phone]  Name: Arthur Howard MRN: [id number] Date of Birth: 1965-08-15

## 2018-08-11 NOTE — Patient Instructions (Signed)
Shoulder exercises: Access Code: [email or web address]  URL: [email or web address]  Date: 08/11/2018  Prepared by: Misty Stanley   Exercises Sidelying Mid Thoracic Rotation - 10 reps - 1 sets - 2x daily - 7x weekly Plank with Thoracic Rotation on Counter - 10 reps - 1 sets - 2x daily - 7x weekly Chest stretch on stomach - 3 sets - 20 second hold - 2x daily - 7x weekly Standing Shoulder Extension with Dowel - 10 reps - 2 sets - 2x daily - 7x weekly

## 2018-08-13 ENCOUNTER — Ambulatory Visit: Payer: Self-pay | Admitting: Physical Therapy

## 2018-08-13 ENCOUNTER — Ambulatory Visit: Payer: Commercial Indemnity | Admitting: Physical Therapy

## 2018-08-13 ENCOUNTER — Other Ambulatory Visit: Payer: Self-pay

## 2018-08-13 ENCOUNTER — Encounter: Payer: Self-pay | Admitting: Physical Therapy

## 2018-08-13 ENCOUNTER — Ambulatory Visit: Payer: Commercial Indemnity | Admitting: Occupational Therapy

## 2018-08-13 DIAGNOSIS — R2689 Other abnormalities of gait and mobility: Secondary | ICD-10-CM

## 2018-08-13 DIAGNOSIS — I69351 Hemiplegia and hemiparesis following cerebral infarction affecting right dominant side: Secondary | ICD-10-CM

## 2018-08-13 DIAGNOSIS — M6281 Muscle weakness (generalized): Secondary | ICD-10-CM

## 2018-08-13 DIAGNOSIS — R293 Abnormal posture: Secondary | ICD-10-CM

## 2018-08-13 NOTE — Therapy (Signed)
[facility] [address] [place], [place], [address] Phone: [phone]   Fax:  [phone]  Physical Therapy Treatment  Patient Details  Name: Arthur Howard MRN: [id number] Date of Birth: [birth year] Referring Provider (PT): Angiulli, Chapman Fitch (from hospital-f/u to be with Sherald Barge, NP)   Encounter Date: 08/13/2018   CLINIC OPERATION CHANGES: [facility] is operating at a low capacity due to COVID-19.  The patient was brought into the clinic for evaluation and/or treatment following universal masking by staff, social distancing, and <10 people in the clinic.  The patient's COVID risk of complications score is 3.   PT End of Session - 08/13/18 1734    Visit Number  21    Number of Visits  22    Date for PT Re-Evaluation  08/25/18    Authorization Type  [employer]-60 visits combined PT and OT if on same day    Authorization - Visit Number  21    Authorization - Number of Visits  60    PT Start Time  1107    PT Stop Time  1147    PT Time Calculation (min)  40 min    Activity Tolerance  Patient tolerated treatment well    Behavior During Therapy  WFL for tasks assessed/performed       Past Medical History:  Diagnosis Date  . Diabetes mellitus without complication ([place])   . Hyperlipidemia     Past Surgical History:  Procedure Laterality Date  . ANKLE SURGERY      There were no vitals filed for this visit.  Subjective Assessment - 08/13/18 1114    Subjective  Pain relief only lasts for about one hour.  Has been hurting in the arm non stop since last night.    Patient is accompained by:  Family member   daughter present to interpret   Patient Stated Goals  Pt's goal is to improve R hand, arm, leg strength.    Currently in Pain?  Yes    Pain Score  6     Pain Location  Shoulder    Pain Orientation  Right    Pain Descriptors / Indicators  Tightness    Pain Onset  More than  a month ago                       [facility] Adult PT Treatment/Exercise - 08/13/18 1730      Shoulder Exercises: Supine   Horizontal ABduction  Strengthening;Both;5 reps;Theraband    Theraband Level (Shoulder Horizontal ABduction)  Level 3 (Green)    Horizontal ABduction Limitations  to focus on scapular retraction; pt required assistance to hold theraband in R hand    Other Supine Exercises  In supine performed isometric cervical retraction and scapular retraction, depression with UE extension.  Performed x 10 reps with 5 second isometric hold following TDN.       Trigger Point Dry Needling - 08/13/18 1727    Consent Given?  Yes    Education Handout Provided  No    Muscles Treated Head and Neck  Upper trapezius    Muscles Treated Upper Quadrant  Biceps    Dry Needling Comments  Patient in supine; educated on dry needling purpose and methodology, side effects and management of soreness to patient via daugther translating.  Pt agreeable.  Utilized 2 needles for R upper trap - .30 x 60 and two needles for R bicep .30 x 30.  Upper Trapezius Response  Twitch reponse elicited    Biceps Response  Twitch response elicited           PT Education - 08/13/18 1734    Education Details  trigger point dry needling     Person(s) Educated  Patient;Child(ren)    Methods  Explanation    Comprehension  Verbalized understanding       PT Short Term Goals - 06/23/18 1106      PT SHORT TERM GOAL #1   Title  Pt will be independent with HEP for improved transfers, balance, gait and strength.  TARGET 06/25/2018    Baseline  06/23/18: met with current program    Status  Achieved    Target Date  06/25/18      PT SHORT TERM GOAL #2   Title  Pt will improve 5x sit<>stand to less than or equal to 12.5 seconds for improved transfer efficiency and lower extremity strength.    Baseline  06/23/18: 9.90 sec's no UE support from standard height surface    Time  --    Period  --    Status   Achieved    Target Date  06/25/18      PT SHORT TERM GOAL #3   Title  Pt will improve TUG score to less than or equal to 13.5 sec for decreased fall risk.    Baseline  06/23/18: 7.13 sec's no device except for right AFO    Time  --    Period  --    Status  Achieved    Target Date  06/25/18      PT SHORT TERM GOAL #4   Title  Pt will improve DGI score to at least 17/24 for decreased fall risk.    Baseline  06/23/18: 19/24 scored today    Time  --    Period  --    Status  Achieved    Target Date  06/25/18      PT SHORT TERM GOAL #5   Title  Pt will negotiate 4 steps with alternating pattern, 1 rail modified independently, for preparation for return to work activities.    Baseline  06/23/18: met today    Time  --    Period  --    Status  Achieved    Target Date  06/25/18      PT SHORT TERM GOAL #6   Title  Pt will verbalize understanding of fall prevention in home environment.    Baseline  06/23/18: met today    Time  --    Period  --    Status  Achieved    Target Date  06/25/18        PT Long Term Goals - 07/23/18 1608      PT LONG TERM GOAL #1   Title  The patient will be independent with progression of HEP for discharge.    Time  4    Period  Weeks    Target Date  08/22/18      PT LONG TERM GOAL #2   Title  The patient will demonstrate return to climbing an A frame ladder with ability to balance, step up with R LE for return to household and work activities.    Time  4    Period  Weeks    Target Date  08/22/18      PT LONG TERM GOAL #3   Title  The patient will be able to walk > 30 minutes  without use of AFO without loss of balance.    Time  4    Period  Weeks    Target Date  08/22/18      PT LONG TERM GOAL #4   Title  The patient will be able to return to yard work activities (discusses wanting to mow the yard-- fairly level surface, however this may depend more on R shoulder pain than balance/gait).      Time  4    Period  Weeks    Target Date  08/22/18       PT LONG TERM GOAL #5   Title  The patient will be able to walk while carrying items (grocery bags, household items, etc).      Time  4    Period  Weeks    Target Date  08/22/18            Plan - 08/13/18 1735    Clinical Impression Statement  Treatment session focused on use of trigger point dry needling to address pain, increased tone and decreased ROMin R shoulder.  Performed TDN to R upper trap and bicep with good response followed by isometric and concentric strengthening to facilitate cervical retraction, thoracic extension, scapular retraction and depression on R side.  Pt tolerated well.  Will continue to address in order to progress towards LTG.    Personal Factors and Comorbidities  Comorbidity 2;Past/Current Experience    Comorbidities  hyperlipidemia, DM    Examination-Activity Limitations  Carry;Stairs;Locomotion Level;Transfers    Examination-Participation Restrictions  Community Activity;Yard Work;Other    Stability/Clinical Decision Making  Evolving/Moderate complexity    Rehab Potential  Good    PT Frequency  2x / week    PT Duration  8 weeks    PT Treatment/Interventions  ADLs/Self Care Home Management;Electrical Stimulation;DME Instruction;Gait training;Stair training;Functional mobility training;Therapeutic activities;Therapeutic exercise;Orthotic Fit/Training;Patient/family education;Neuromuscular re-education;Balance training;Manual techniques;Spinal Manipulations;Aquatic Therapy;Cryotherapy;Moist Heat;Passive range of motion;Dry needling    PT Next Visit Plan  Check LTG- renew?    PT Home Exercise Plan  Access Code: [email or web address] , shoulder stretches: [email or web address]    Consulted and Agree with Plan of Care  Patient;Family member/caregiver    Family Member Consulted  Daughter       Patient will benefit from skilled therapeutic intervention in order to improve the following deficits and impairments:  Abnormal gait, Decreased balance, Decreased mobility, Difficulty walking,  Decreased strength, Impaired flexibility, Impaired tone, Postural dysfunction, Pain, Increased muscle spasms, Decreased range of motion  Visit Diagnosis: Abnormal posture  Hemiplegia and hemiparesis following cerebral infarction affecting right dominant side (HCC)  Muscle weakness (generalized)  Other abnormalities of gait and mobility     Problem List Patient Active Problem List   Diagnosis Date Noted  . 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 Left pontine cerebrovascular accident ([place]) 05/11/2018  . Tobacco abuse   . Uncontrolled type 2 diabetes mellitus with hyperglycemia ([place])   . CVA (cerebral vascular accident) ([place]) 05/06/2018  . Hyperlipidemia 05/30/2017    Rico Junker, PT, DPT 08/13/18    5:41 PM     [facility] [address] [place], [place], [address] Phone: [phone]   Fax:  [phone]  Name: Arthur Howard MRN: [id number] Date of Birth: [birth year]

## 2018-08-13 NOTE — Therapy (Signed)
[facility] [address] [place], [place], [address] Phone: [phone]   Fax:  [phone]  Occupational Therapy Treatment  Patient Details  Name: Arthur Howard MRN: [id number] Date of Birth: Apr 14, 1965 No data recorded  Encounter Date: 08/13/2018  OT End of Session - 08/13/18 1305    Visit Number  20    Number of Visits  33    Date for OT Re-Evaluation  08/25/18    Authorization Type  [employer]    Authorization Time Period  12 weeks, 60 visit limit, counts as 1 visit if PT/OTsame day    Authorization - Visit Number  20    Authorization - Number of Visits  60    OT Start Time  1150    OT Stop Time  1235    OT Time Calculation (min)  45 min    Activity Tolerance  Patient tolerated treatment well    Behavior During Therapy  WFL for tasks assessed/performed       Past Medical History:  Diagnosis Date  . Diabetes mellitus without complication ([place])   . Hyperlipidemia     Past Surgical History:  Procedure Laterality Date  . ANKLE SURGERY      There were no vitals filed for this visit.  Subjective Assessment - 08/13/18 1159    Subjective   My pain has been worse and non stop since last night (pt got dry needled today from P.T. ). Pt also only tolerating splint up to 1.5 hours at a time (no signs of pressure areas, just difficulty getting used to splint)     Patient is accompanied by:  Family member   Daughter interpreted   Pertinent History  CVA, DM, hyperlipidemia    Limitations  non English speaking- MD gave pt shoulder injection and recommended limiting shoulder flexion to approx 90* fo 1 week, pt was diagnosed with frzen shoulder and shaoulder hand syndrome by Dr. Letta Pate,    Patient Stated Goals  recover use of RUE    Currently in Pain?  Yes    Pain Score  8     Pain Location  Neck   down to mid arm   Pain Orientation  Right    Pain Descriptors / Indicators  Stabbing    Pain Type  Chronic pain    Pain Onset  More than a month ago    Pain Frequency  Constant    Aggravating Factors   nothing    Pain Relieving Factors  nothing         [facility] OT Assessment - 08/13/18 0001      Coordination   Box and Blocks  RUE 50 blocks        CLINIC OPERATION CHANGES: [facility] is operating at a low capacity due to COVID-19.  The patient was brought into the clinic for evaluation and/or treatment following universal masking by staff, social distancing, and <10 people in the clinic.  The patient's COVID risk of complications score is 3.   Assessed splint - appears to fit well and pt denies pressure areas, but difficulty tolerating - currently only at 1.5 hours of consecutive wear.  Pt has had high pain Rt neck/shoulder area since last night - however denies pain with NMR and functional reaching during session. Pt also had dry needling to upper traps and biceps with prior P.T. appointment. Reviewed bed positioning and proper support so pt does not guard/hike shoulder. Also reviewed proper reaching pattern to  lead with hand and not shoulder. Assessed Box & Blocks - see above  NMR: closed chain wt bearing through RUE w/ lateral trunk flexion and downward depression of scapula - started working bilaterally through [place] then progressed to RUE only while reaching high LUE. AA/ROM BUE's in shoulder abduction with focus on letting body/ribcage assist in movement for proper reach and scapula mobility along thoracic spine. AA/ROM in shoulder flexion, followed by active reaching Place and hold ex's in finger flexion and thumb opposition in prep for in hand manipulation                OT Short Term Goals - 07/21/18 1452      OT SHORT TERM GOAL #1   Title  I with inital HEP.    Time  6    Period  Weeks    Status  Achieved    Target Date  07/11/18      OT SHORT TERM GOAL #2   Title  Pt will demonstrate  improved RUE functional use as evidenced by increasing box/  blocks score by 8 blocks.    Baseline  RUE 48, LUE 75    Time  6    Period  Weeks    Status  On-going      OT SHORT TERM GOAL #3   Title  Pt will demonstrate ability to retrieve a light weight object from overhead shelf at 120 shoulder flexion with no more than min compensations.    Time  6    Period  Weeks    Status  On-going   07/13/18:  95*     OT SHORT TERM GOAL #4   Title  Pt will perfrom all basic ADLS modified independently.    Time  6    Period  Weeks    Status  Achieved   07/13/18     OT SHORT TERM GOAL #5   Title  Pt will increase RUE grip strength to 35 lbs of greater for increased functional use.    Time  6    Period  Weeks    Status  On-going   07/13/18:  2lbs     OT SHORT TERM GOAL #6   Title  Pt will report using his RUE to assist with ADLs at least 50% of the time or greater.    Time  12    Period  Weeks    Status  Achieved   07/13/18: met per pt report       OT Long Term Goals - 05/27/18 1326      OT LONG TERM GOAL #1   Title  Pt will demonstrate ability to retrieve a 3 lbs weight from overhead shelf demonstrating good control with RUE.    Time  12    Period  Weeks    Status  New    Target Date  08/25/18      OT LONG TERM GOAL #2   Title  Pt will perform mod complex home managment modified independently.    Time  12    Period  Weeks    Status  New      OT LONG TERM GOAL #3   Title  Pt will demonstrate improved RUE fine motor coordination for ADLS as evidenced by completing 9 hole peg test in 38 secs or less.    Time  12    Period  Weeks    Status  New      OT LONG TERM GOAL #  4   Title  Pt will report that he has returned to using his RUE as dominant hand at least 75% of the time for ADLs/IADLs..    Time  12    Period  Weeks    Status  New      OT LONG TERM GOAL #5   Title  Pt will demonstrate ability to write his name and address legibily using RUE.    Time  12    Period  Weeks    Status  New      Long Term Additional Goals    Additional Long Term Goals  Yes      OT LONG TERM GOAL #6   Title  Pt will perform simulated work activities modified indpendently demonstrating good safety awareness.    Time  12    Period  Weeks    Status  New            Plan - 08/13/18 1305    Clinical Impression Statement  Pt progressing slowly towards goals w/ pain and shoulder compensation most limiting factor.    Occupational performance deficits (Please refer to evaluation for details):  ADL's;IADL's;Work;Leisure;Social Participation    Body Structure / Function / Physical Skills  ADL;Dexterity;Flexibility;ROM;Strength;IADL;FMC;Coordination;Balance;UE functional use;GMC;Gait;Endurance;Decreased knowledge of precautions;Sensation    Rehab Potential  Good    OT Frequency  2x / week    OT Duration  8 weeks    OT Treatment/Interventions  Self-care/ADL training;Cryotherapy;Paraffin;Therapeutic exercise;DME and/or AE instruction;Functional Mobility Training;Balance training;Splinting;Manual Therapy;Neuromuscular education;Fluidtherapy;Ultrasound;Aquatic Therapy;Electrical Stimulation;Moist Heat;Contrast Bath;Energy conservation;Passive range of motion;Therapeutic activities;Patient/family education    Plan  continue to assess tolerance to splint and bed positioning, continue NMR and scapula depression and hand function/ROM    Consulted and Agree with Plan of Care  Patient;Family member/caregiver    Family Member Consulted  dtr who interprets for him       Patient will benefit from skilled therapeutic intervention in order to improve the following deficits and impairments:  Body Structure / Function / Physical Skills  Visit Diagnosis: Hemiplegia and hemiparesis following cerebral infarction affecting right dominant side ([facility])  Muscle weakness (generalized)  Abnormal posture    Problem List Patient Active Problem List   Diagnosis Date Noted  . 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 Left pontine cerebrovascular accident ([place]) 05/11/2018  . Tobacco abuse   . Uncontrolled type 2 diabetes mellitus with hyperglycemia ([place])   . CVA (cerebral vascular accident) ([place]) 05/06/2018  . Hyperlipidemia 05/30/2017    Carey Bullocks, OTR/L 08/13/2018, 1:08 PM  [facility] [address] [place], [place], [address] Phone: [phone]   Fax:  [phone]  Name: Arthur Howard MRN: [id number] Date of Birth: 10-07-1965

## 2018-08-18 ENCOUNTER — Other Ambulatory Visit: Payer: Self-pay

## 2018-08-18 ENCOUNTER — Ambulatory Visit: Payer: Commercial Indemnity | Admitting: Occupational Therapy

## 2018-08-18 ENCOUNTER — Ambulatory Visit: Payer: Self-pay | Admitting: Physical Therapy

## 2018-08-18 ENCOUNTER — Ambulatory Visit: Payer: Commercial Indemnity | Admitting: Physical Therapy

## 2018-08-18 ENCOUNTER — Encounter: Payer: Self-pay | Admitting: Physical Therapy

## 2018-08-18 DIAGNOSIS — R2689 Other abnormalities of gait and mobility: Secondary | ICD-10-CM

## 2018-08-18 DIAGNOSIS — R278 Other lack of coordination: Secondary | ICD-10-CM

## 2018-08-18 DIAGNOSIS — R2681 Unsteadiness on feet: Secondary | ICD-10-CM

## 2018-08-18 DIAGNOSIS — R293 Abnormal posture: Secondary | ICD-10-CM

## 2018-08-18 DIAGNOSIS — I69351 Hemiplegia and hemiparesis following cerebral infarction affecting right dominant side: Secondary | ICD-10-CM

## 2018-08-18 DIAGNOSIS — M6281 Muscle weakness (generalized): Secondary | ICD-10-CM

## 2018-08-18 NOTE — Therapy (Signed)
[facility] [address] [place], [place], [address] Phone: [phone]   Fax:  [phone]  Occupational Therapy Treatment  Patient Details  Name: Arthur Howard MRN: [id number] Date of Birth: 05/01/1965 No data recorded  Encounter Date: 08/18/2018  OT End of Session - 08/18/18 1338    Visit Number  21    Number of Visits  33    Date for OT Re-Evaluation  08/25/18    Authorization Type  [employer]    Authorization Time Period  12 weeks, 60 visit limit, counts as 1 visit if PT/OTsame day    Authorization - Visit Number  21    Authorization - Number of Visits  60    OT Start Time  1102    OT Stop Time  1148    OT Time Calculation (min)  46 min    Activity Tolerance  Patient tolerated treatment well    Behavior During Therapy  WFL for tasks assessed/performed       Past Medical History:  Diagnosis Date  . Diabetes mellitus without complication ([facility])   . Hyperlipidemia     Past Surgical History:  Procedure Laterality Date  . ANKLE SURGERY      There were no vitals filed for this visit.  Subjective Assessment - 08/18/18 1343    Limitations  non English speaking- MD gave pt shoulder injection and recommended limiting shoulder flexion to approx 90* fo 1 week, pt was diagnosed with frzen shoulder and shaoulder hand syndrome by Dr. Kirsteins,    Patient Stated Goals  recover use of RUE    Currently in Pain?  Yes    Pain Score  6     Pain Location  Arm    Pain Orientation  Right    Pain Descriptors / Indicators  Sharp    Pain Type  Chronic pain    Pain Onset  More than a month ago    Pain Frequency  Constant    Aggravating Factors   malpositioning    Pain Relieving Factors  reposistioning               CLINIC OPERATION CHANGES: [facility] is operating at a low capacity due to COVID-19.  The patient was brought into the clinic for evaluation and/or treatment following  universal masking by staff, social distancing, and <10 people in the clinic.  The patient's COVID risk of complications score is 3.     NMR: Sidelying scapular mobilization and soft tissue mobs to trigger point at border of scapula. Supine bilateral shoulder abduction followed by closed chain shoulder flexion with therapist facilitating shoulder/ scapular positioning. Seated weightbearing through right elbow with body on arm movements and trunk rotation with therapist facilitating posture and scapula Bilateral shoulder extension with cane, min facilitation v.c followed by UE ranger for AA/ROM mid range shoulder flexion mod facilitation for proper positioning. Gentle passive stretch with right wrist in flexion/ extension with gentle distraction. Pt reports pain decreased to 4/10 end of session.                   OT Short Term Goals - 07/21/18 1452      OT SHORT TERM GOAL #1   Title  I with inital HEP.    Time  6    Period  Weeks    Status  Achieved    Target Date  07/11/18      OT SHORT TERM GOAL #2     Title  Pt will demonstrate  improved RUE functional use as evidenced by increasing box/ blocks score by 8 blocks.    Baseline  RUE 48, LUE 75    Time  6    Period  Weeks    Status  On-going      OT SHORT TERM GOAL #3   Title  Pt will demonstrate ability to retrieve a light weight object from overhead shelf at 120 shoulder flexion with no more than min compensations.    Time  6    Period  Weeks    Status  On-going   07/13/18:  95*     OT SHORT TERM GOAL #4   Title  Pt will perfrom all basic ADLS modified independently.    Time  6    Period  Weeks    Status  Achieved   07/13/18     OT SHORT TERM GOAL #5   Title  Pt will increase RUE grip strength to 35 lbs of greater for increased functional use.    Time  6    Period  Weeks    Status  On-going   07/13/18:  2lbs     OT SHORT TERM GOAL #6   Title  Pt will report using his RUE to assist with ADLs at least 50% of  the time or greater.    Time  12    Period  Weeks    Status  Achieved   07/13/18: met per pt report       OT Long Term Goals - 05/27/18 1326      OT LONG TERM GOAL #1   Title  Pt will demonstrate ability to retrieve a 3 lbs weight from overhead shelf demonstrating good control with RUE.    Time  12    Period  Weeks    Status  New    Target Date  08/25/18      OT LONG TERM GOAL #2   Title  Pt will perform mod complex home managment modified independently.    Time  12    Period  Weeks    Status  New      OT LONG TERM GOAL #3   Title  Pt will demonstrate improved RUE fine motor coordination for ADLS as evidenced by completing 9 hole peg test in 38 secs or less.    Time  12    Period  Weeks    Status  New      OT LONG TERM GOAL #4   Title  Pt will report that he has returned to using his RUE as dominant hand at least 75% of the time for ADLs/IADLs..    Time  12    Period  Weeks    Status  New      OT LONG TERM GOAL #5   Title  Pt will demonstrate ability to write his name and address legibily using RUE.    Time  12    Period  Weeks    Status  New      Long Term Additional Goals   Additional Long Term Goals  Yes      OT LONG TERM GOAL #6   Title  Pt will perform simulated work activities modified indpendently demonstrating good safety awareness.    Time  12    Period  Weeks    Status  New            Plan - 08/18/18 1339  Clinical Impression Statement  Pt progressing slowly towards goals. Pt remains limited by shoulder pain and intermittant hand pain and stiffness. (Pt has been diagnosed with CRPS) Pt was instructed to only wear his resting hand splint at nigh to see if improves hand pain.    Occupational performance deficits (Please refer to evaluation for details):  ADL's;IADL's;Work;Leisure;Social Participation    Body Structure / Function / Physical Skills  ADL;Dexterity;Flexibility;ROM;Strength;IADL;FMC;Coordination;Balance;UE functional  use;GMC;Gait;Endurance;Decreased knowledge of precautions;Sensation    Rehab Potential  Good    OT Frequency  2x / week    OT Duration  8 weeks    OT Treatment/Interventions  Self-care/ADL training;Cryotherapy;Paraffin;Therapeutic exercise;DME and/or AE instruction;Functional Mobility Training;Balance training;Splinting;Manual Therapy;Neuromuscular education;Fluidtherapy;Ultrasound;Aquatic Therapy;Electrical Stimulation;Moist Heat;Contrast Bath;Energy conservation;Passive range of motion;Therapeutic activities;Patient/family education    Plan  pain reduction strategies,  continue NMR and scapula depression and hand function/ROM    Consulted and Agree with Plan of Care  Patient;Family member/caregiver    Family Member Consulted  dtr who interprets for him       Patient will benefit from skilled therapeutic intervention in order to improve the following deficits and impairments:  Body Structure / Function / Physical Skills  Visit Diagnosis: Hemiplegia and hemiparesis following cerebral infarction affecting right dominant side (HCC)  Muscle weakness (generalized)  Abnormal posture  Other lack of coordination  Other abnormalities of gait and mobility    Problem List Patient Active Problem List   Diagnosis Date Noted  . 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 ([facility]CC) 08/02/2018  . Left pontine cerebrovascular accident ([facility]) 05/11/2018  . Tobacco abuse   . Uncontrolled type 2 diabetes mellitus with hyperglycemia ([facility])   . CVA (cerebral vascular accident) ([facility]) 05/06/2018  . Hyperlipidemia 05/30/2017    RINE,KATHRYN 08/18/2018, 1:44 PM  [facility] [address] [place], , [address] Phone: [phone]   Fax:  [phone]  Name: Arthur Howard MRN: [id number] Date of Birth: 01/09/1966 

## 2018-08-18 NOTE — Therapy (Addendum)
[name] [address] [place], [place], [address] Phone: [phone]   Fax:  [phone]  Physical Therapy Treatment  Patient Details  Name: Arthur Howard MRN: [id number] Date of Birth: 1966-01-09 Referring Provider (PT): Angiulli, Chapman Fitch (from hospital-f/u to be with Sherald Barge, NP)   Encounter Date: 08/18/2018   CLINIC OPERATION CHANGES: [facility] is operating at a low capacity due to COVID-19.  The patient was brought into the clinic for evaluation and/or treatment following universal masking by staff, social distancing, and <10 people in the clinic.  The patient's COVID risk of complications score is 3.   PT End of Session - 08/18/18 1023    Visit Number  22    Number of Visits  22    Date for PT Re-Evaluation  08/25/18    Authorization Type  [employer]-60 visits combined PT and OT if on same day    Authorization - Visit Number  22    Authorization - Number of Visits  60    PT Start Time  0258    PT Stop Time  1057    PT Time Calculation (min)  39 min    Equipment Utilized During Treatment  Gait belt    Activity Tolerance  Patient tolerated treatment well;No increased pain    Behavior During Therapy  WFL for tasks assessed/performed       Past Medical History:  Diagnosis Date  . Diabetes mellitus without complication ([place])   . Hyperlipidemia     Past Surgical History:  Procedure Laterality Date  . ANKLE SURGERY      There were no vitals filed for this visit.  Subjective Assessment - 08/18/18 1020    Subjective  No new complaints. Does feel that the dry needling helped his arm/shoulder pain. No falls. Does still have some pain in right arm/shoulder. Goes tomorrow for second injection from Dr. Letta Pate.      Patient is accompained by:  Family member    Patient Stated Goals  Pt's goal is to improve R hand, arm, leg strength.    Currently in Pain?  Yes    Pain  Score  5     Pain Location  Arm   from neck down to mid arm   Pain Orientation  Right    Pain Descriptors / Indicators  Stabbing    Pain Type  Chronic pain    Pain Onset  More than a month ago    Pain Frequency  Constant   varies in intensity   Aggravating Factors   immobility, mal positioning    Pain Relieving Factors  therapy on shoulders         [facility] PT Assessment - 08/18/18 1028      6 Minute Walk- Baseline   6 Minute Walk- Baseline  yes    BP (mmHg)  139/77    HR (bpm)  57    Modified Borg Scale for Dyspnea  0- Nothing at all    Perceived Rate of Exertion (Borg)  6-      6 Minute walk- Post Test   6 Minute Walk Post Test  yes    BP (mmHg)  143/78    HR (bpm)  57    Modified Borg Scale for Dyspnea  0- Nothing at all    Perceived Rate of Exertion (Borg)  7- Very, very light      6 minute walk test results    Aerobic Endurance Distance  Walked  1238    Endurance additional comments  no AD, no rest breaks. no imbalance noted with decr right DF noted with swing phase of gait.       Functional Gait  Assessment   Gait assessed   Yes    Gait Level Surface  Walks 20 ft in less than 7 sec but greater than 5.5 sec, uses assistive device, slower speed, mild gait deviations, or deviates 6-10 in outside of the 12 in walkway width.   5.86 sec's   Change in Gait Speed  Able to smoothly change walking speed without loss of balance or gait deviation. Deviate no more than 6 in outside of the 12 in walkway width.    Gait with Horizontal Head Turns  Performs head turns smoothly with no change in gait. Deviates no more than 6 in outside 12 in walkway width    Gait with Vertical Head Turns  Performs head turns with no change in gait. Deviates no more than 6 in outside 12 in walkway width.    Gait and Pivot Turn  Pivot turns safely within 3 sec and stops quickly with no loss of balance.    Step Over Obstacle  Is able to step over 2 stacked shoe boxes taped together (9 in total height) without  changing gait speed. No evidence of imbalance.    Gait with Narrow Base of Support  Is able to ambulate for 10 steps heel to toe with no staggering.    Gait with Eyes Closed  Walks 20 ft, no assistive devices, good speed, no evidence of imbalance, normal gait pattern, deviates no more than 6 in outside 12 in walkway width. Ambulates 20 ft in less than 7 sec.    Ambulating Backwards  Walks 20 ft, no assistive devices, good speed, no evidence for imbalance, normal gait    Steps  Alternating feet, no rail.    Total Score  29          [facility] Adult PT Treatment/Exercise - 08/18/18 1221      Self-Care   Self-Care  Other Self-Care Comments    Other Self-Care Comments   reviewed goals as follows- pt reports partial return to yard work, limited by endurance and right UE pain/weakness, limited return to helping with bringing in grocery/carrying items around home. Has not began work simulated activites other than climbing ladders- will need to be able to carry heavy (up to 250# rolls of insulation) up/down stairs/ladders, push/pull on heavy items such as rolls of insulation and maneuver in small/tight spaces to return to work. Pt would like to address these in continued PT sessions.            PT Short Term Goals - 06/23/18 1106      PT SHORT TERM GOAL #1   Title  Pt will be independent with HEP for improved transfers, balance, gait and strength.  TARGET 06/25/2018    Baseline  06/23/18: met with current program    Status  Achieved    Target Date  06/25/18      PT SHORT TERM GOAL #2   Title  Pt will improve 5x sit<>stand to less than or equal to 12.5 seconds for improved transfer efficiency and lower extremity strength.    Baseline  06/23/18: 9.90 sec's no UE support from standard height surface    Time  --    Period  --    Status  Achieved    Target Date  06/25/18  PT SHORT TERM GOAL #3   Title  Pt will improve TUG score to less than or equal to 13.5 sec for decreased fall risk.     Baseline  06/23/18: 7.13 sec's no device except for right AFO    Time  --    Period  --    Status  Achieved    Target Date  06/25/18      PT SHORT TERM GOAL #4   Title  Pt will improve DGI score to at least 17/24 for decreased fall risk.    Baseline  06/23/18: 19/24 scored today    Time  --    Period  --    Status  Achieved    Target Date  06/25/18      PT SHORT TERM GOAL #5   Title  Pt will negotiate 4 steps with alternating pattern, 1 rail modified independently, for preparation for return to work activities.    Baseline  06/23/18: met today    Time  --    Period  --    Status  Achieved    Target Date  06/25/18      PT SHORT TERM GOAL #6   Title  Pt will verbalize understanding of fall prevention in home environment.    Baseline  06/23/18: met today    Time  --    Period  --    Status  Achieved    Target Date  06/25/18        PT Long Term Goals - 08/18/18 1024      PT LONG TERM GOAL #1   Title  The patient will be independent with progression of HEP for discharge.    Baseline  08/18/18: met with current HEP    Time  4    Period  Weeks    Status  Achieved      PT LONG TERM GOAL #2   Title  The patient will demonstrate return to climbing an A frame ladder with ability to balance, step up with R LE for return to household and work activities.    Baseline  08/18/18: has performed this once in therapy, not doing at home    Time  --    Period  --    Status  Partially Met      PT LONG TERM GOAL #3   Title  The patient will be able to walk > 30 minutes without use of AFO without loss of balance.    Baseline  08/18/18: walked for 50 minutes yesterday without brace with pt reporting no issues.     Time  --    Period  --    Status  Achieved      PT LONG TERM GOAL #4   Title  The patient will be able to return to yard work activities (discusses wanting to mow the yard-- fairly level surface, however this may depend more on R shoulder pain than balance/gait).      Baseline   08/18/18: has started with mowing, however needs help to start mower and only able push with left UE. Only mowed for 10-15 minute increments so not to fatigue.     Time  --    Period  --    Status  Partially Met      PT LONG TERM GOAL #5   Title  The patient will be able to walk while carrying items (grocery bags, household items, etc).      Baseline  08/18/18:  has been some, ~20-30% return to pre stroke activity    Time  --    Period  --    Status  Partially Met       New goals for recertification: PT Short Term Goals - 08/19/18 1453      PT SHORT TERM GOAL #1   Title  = LTG      PT Long Term Goals - 08/19/18 1453      PT LONG TERM GOAL #1   Title  The patient will be independent with progression of HEP for discharge.    Baseline  08/18/18: met with current HEP    Time  4    Period  Weeks    Status  Revised    Target Date  09/25/18      PT LONG TERM GOAL #2   Title  The patient will demonstrate return to climbing an A frame ladder with use of RUE and independent ability to balance, step up with R LE for return to household and work activities.    Time  4    Period  Weeks    Status  Revised    Target Date  09/25/18      PT LONG TERM GOAL #3   Title  Pt will demonstrate improvement in overall function as indicated by FOTO score of 77    Baseline  67% functional intake    Time  4    Period  Weeks    Status  New    Target Date  09/25/18      PT LONG TERM GOAL #4   Title  The patient will be able to return to yard work activities (discusses wanting to mow the yard-- fairly level surface, however this may depend more on R shoulder pain than balance/gait).      Baseline  08/18/18: has started with mowing, however needs help to start mower and only able push with left UE. Only mowed for 10-15 minute increments so not to fatigue.     Time  4    Period  Weeks    Status  Revised    Target Date  09/25/18      PT LONG TERM GOAL #5   Title  The patient will be able to walk while  carrying items (grocery bags, household items, etc).      Baseline  08/18/18: has been some, ~20-30% return to pre stroke activity    Time  4    Period  Weeks    Status  Revised    Target Date  09/25/18      Rico Junker, PT, DPT 08/19/18    2:56 PM        Plan - 08/18/18 1024    Clinical Impression Statement  Today's skilled session focused on progress toward LTGs due this week for anticpated recertification to continue to use trigger point dry needling and address return to work activities. Pt in agreeament with this.   Addendum by PT: Pt is making steady progress and has met 2/5 LTG, partially meeting other 3 goals.  Pt demonstrates significant improvement in balance and LE strength but continues to present with significant pain, decreased ROM and use of RUE for functional household, outdoor and job related activities.  Pt will benefit from continued skilled PT services to address these impairments to maximize functional mobility independence.    Personal Factors and Comorbidities  Comorbidity 2;Past/Current Experience    Comorbidities  hyperlipidemia, DM  Examination-Activity Limitations  Carry;Stairs;Locomotion Level;Transfers    Examination-Participation Restrictions  Community Activity;Yard Work;Other    Stability/Clinical Decision Making  Evolving/Moderate complexity    Rehab Potential  Good    PT Frequency  2x / week    PT Duration  8 weeks   anticipate D/C in 4   PT Treatment/Interventions  ADLs/Self Care Home Management;Electrical Stimulation;DME Instruction;Gait training;Stair training;Functional mobility training;Therapeutic activities;Therapeutic exercise;Orthotic Fit/Training;Patient/family education;Neuromuscular re-education;Balance training;Manual techniques;Spinal Manipulations;Aquatic Therapy;Cryotherapy;Moist Heat;Passive range of motion;Dry needling    PT Next Visit Plan  RUE dry needling as indicated; begin to work on high level strengtheing/return to work  activities    PT Home Exercise Plan  Access Code: [email or web address] , shoulder stretches: [email or web address]    Consulted and Agree with Plan of Care  Patient;Family member/caregiver    Family Member Consulted  Daughter     Rico Junker, PT, DPT 08/19/18    2:56 PM     Patient will benefit from skilled therapeutic intervention in order to improve the following deficits and impairments:  Abnormal gait, Decreased balance, Decreased mobility, Difficulty walking, Decreased strength, Impaired flexibility, Impaired tone, Postural dysfunction, Pain, Increased muscle spasms, Decreased range of motion  Visit Diagnosis: Hemiplegia and hemiparesis following cerebral infarction affecting right dominant side (HCC)  Muscle weakness (generalized)  Abnormal posture  Other abnormalities of gait and mobility     Problem List Patient Active Problem List   Diagnosis Date Noted  . 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 Left pontine cerebrovascular accident ([place]) 05/11/2018  . Tobacco abuse   . Uncontrolled type 2 diabetes mellitus with hyperglycemia ([place])   . CVA (cerebral vascular accident) ([place]) 05/06/2018  . Hyperlipidemia 05/30/2017    Willow Ora, PTA, [facility] [address] [place], [place] [address] [phone] 08/18/18, 12:30 PM   Rico Junker, PT, DPT 08/19/18    2:56 PM     Name: Arthur Howard MRN: [id number] Date of Birth: 03-16-1966

## 2018-08-19 ENCOUNTER — Other Ambulatory Visit: Payer: Self-pay

## 2018-08-19 ENCOUNTER — Encounter: Payer: Commercial Indemnity | Admitting: Physical Medicine & Rehabilitation

## 2018-08-19 VITALS — BP 135/84 | HR 61 | Temp 98.0°F | Ht 65.0 in | Wt 153.4 lb

## 2018-08-19 DIAGNOSIS — I6350 Cerebral infarction due to unspecified occlusion or stenosis of unspecified cerebral artery: Secondary | ICD-10-CM | POA: Diagnosis present

## 2018-08-19 DIAGNOSIS — I639 Cerebral infarction, unspecified: Secondary | ICD-10-CM

## 2018-08-19 DIAGNOSIS — E1165 Type 2 diabetes mellitus with hyperglycemia: Secondary | ICD-10-CM | POA: Diagnosis present

## 2018-08-19 DIAGNOSIS — E7849 Other hyperlipidemia: Secondary | ICD-10-CM | POA: Diagnosis present

## 2018-08-19 NOTE — Addendum Note (Signed)
Addended by: Rico Junker on: 08/19/2018 03:05 PM   Modules accepted: Orders

## 2018-08-19 NOTE — Progress Notes (Signed)
Trigger point injection  Right levator scapula  Indication myofascial pain upper medial scapular border  Pain has persisted despite physical therapy as well as medication management. Pain does interfere with his activity level. Patient placed in a seated position the upper medial scapular border was palpated along the line connecting the cervical spine and the upper medial scapular border.  2 areas were identified marked and prepped with Betadine.  There sprayed with cooling spray followed by insertion of 25-gauge 1.5 inch needle injected 1 mL of 1% lidocaine into each site followed by trigger point deactivation.  Patient tolerated procedure well Patient instructions given. Follow-up in 4 weeks

## 2018-08-19 NOTE — Patient Instructions (Signed)
Adhesive Capsulitis    Adhesive capsulitis, also called frozen shoulder, causes the shoulder to become stiff and painful to move. This condition happens when there is inflammation of the tendons and ligaments that surround the shoulder joint (shoulder capsule).  What are the causes?  This condition may be caused by:   An injury to your shoulder joint.   Straining your shoulder.   Not moving your shoulder for a period of time. This can happen if your arm was injured or in a sling.   Long-standing conditions, such as:  ? Diabetes.  ? Thyroid problems.  ? Heart disease.  ? Stroke.  ? Rheumatoid arthritis.  ? Lung disease.  In some cases, the cause is not known.  What increases the risk?  You are more likely to develop this condition if you are:   A woman.   Older than 55 years of age.  What are the signs or symptoms?  Symptoms of this condition include:   Pain in your shoulder when you move your arm. There may also be pain when parts of your shoulder are touched. The pain may be worse at night or when you are resting.   A sore or aching shoulder.   The inability to move your shoulder normally.   Muscle spasms.  How is this diagnosed?  This condition is diagnosed with a physical exam and imaging tests, such as an X-ray or MRI.  How is this treated?  This condition may be treated with:   Treatment of the underlying cause or condition.   Medicine. Medicine may be given to relieve pain, inflammation, or muscle spasms.   Steroid injections into the shoulder joint.   Physical therapy. This involves performing exercises to get the shoulder moving again.   Acupuncture. This is a type of treatment that involves stimulating specific points on your body by inserting thin needles through your skin.   Shoulder manipulation. This is a procedure to move the shoulder into another position. It is done after you are given a medicine to make you fall asleep (general anesthetic). The joint may also be injected with salt  water at high pressure to break down scarring.   Surgery. This may be done in severe cases when other treatments have failed.  Although most people recover completely from adhesive capsulitis, some may not regain full shoulder movement.  Follow these instructions at home:  Managing pain, stiffness, and swelling          If directed, put ice on the injured area:  ? Put ice in a plastic bag.  ? Place a towel between your skin and the bag.  ? Leave the ice on for 20 minutes, 2-3 times per day.   If directed, apply heat to the affected area before you exercise. Use the heat source that your health care provider recommends, such as a moist heat pack or a heating pad.  ? Place a towel between your skin and the heat source.  ? Leave the heat on for 20-30 minutes.  ? Remove the heat if your skin turns bright red. This is especially important if you are unable to feel pain, heat, or cold. You may have a greater risk of getting burned.  General instructions   Take over-the-counter and prescription medicines only as told by your health care provider.   If you are being treated with physical therapy, follow instructions from your physical therapist.   Avoid exercises that put a lot of demand on your   shoulder, such as throwing. These exercises can make pain worse.   Keep all follow-up visits as told by your health care provider. This is important.  Contact a health care provider if:   You develop new symptoms.   Your symptoms get worse.  Summary   Adhesive capsulitis, also called frozen shoulder, causes the shoulder to become stiff and painful to move.   You are more likely to have this condition if you are a woman and over age 40.   It is treated with physical therapy, medicines, and sometimes surgery.  This information is not intended to replace advice given to you by your health care provider. Make sure you discuss any questions you have with your health care provider.  Document Released: 01/05/2009 Document  Revised: 08/14/2017 Document Reviewed: 08/14/2017  [employer]vier Interactive Patient Education  2019 [employer]

## 2018-08-20 ENCOUNTER — Ambulatory Visit: Payer: Commercial Indemnity | Admitting: Occupational Therapy

## 2018-08-20 ENCOUNTER — Other Ambulatory Visit: Payer: Self-pay

## 2018-08-20 ENCOUNTER — Ambulatory Visit: Payer: Self-pay | Admitting: Physical Therapy

## 2018-08-20 ENCOUNTER — Ambulatory Visit: Payer: Commercial Indemnity | Admitting: Physical Therapy

## 2018-08-20 DIAGNOSIS — R278 Other lack of coordination: Secondary | ICD-10-CM

## 2018-08-20 DIAGNOSIS — R2681 Unsteadiness on feet: Secondary | ICD-10-CM

## 2018-08-20 DIAGNOSIS — R293 Abnormal posture: Secondary | ICD-10-CM

## 2018-08-20 DIAGNOSIS — I69351 Hemiplegia and hemiparesis following cerebral infarction affecting right dominant side: Secondary | ICD-10-CM

## 2018-08-20 DIAGNOSIS — M6281 Muscle weakness (generalized): Secondary | ICD-10-CM | POA: Diagnosis not present

## 2018-08-20 DIAGNOSIS — R2689 Other abnormalities of gait and mobility: Secondary | ICD-10-CM

## 2018-08-20 NOTE — Therapy (Signed)
[facility] [address] [place], [place], [address] Phone: [phone]   Fax:  [phone]  Occupational Therapy Treatment  Patient Details  Name: Arthur Howard MRN: [id number] Date of Birth: Sep 20, 1965 No data recorded  Encounter Date: 08/20/2018  OT End of Session - 08/20/18 1122    Visit Number  22    Number of Visits  33    Date for OT Re-Evaluation  08/25/18    Authorization Type  [employer]    Authorization Time Period  12 weeks, 60 visit limit, counts as 1 visit if PT/OTsame day    Authorization - Visit Number  33    Authorization - Number of Visits  60    OT Start Time  1100    OT Stop Time  1145    OT Time Calculation (min)  45 min       Past Medical History:  Diagnosis Date  . Diabetes mellitus without complication ([place])   . Hyperlipidemia     Past Surgical History:  Procedure Laterality Date  . ANKLE SURGERY      There were no vitals filed for this visit.  Subjective Assessment - 08/20/18 1121    Pertinent History  CVA, DM, hyperlipidemia    Limitations  non English speaking- MD gave pt shoulder injection and recommended limiting shoulder flexion to approx 90* fo 1 week, pt was diagnosed with frzen shoulder and shaoulder hand syndrome by Dr. Letta Pate,    Patient Stated Goals  recover use of RUE    Currently in Pain?  Yes    Pain Score  6     Pain Location  Arm    Pain Orientation  Right    Pain Descriptors / Indicators  Aching    Pain Type  Chronic pain    Pain Onset  More than a month ago    Pain Frequency  Constant    Aggravating Factors   malpositioning    Pain Relieving Factors  reposistioning            CLINIC OPERATION CHANGES: [facility] is operating at a low capacity due to COVID-19.  The patient was brought into the clinic for evaluation and/or treatment following universal masking by staff, social distancing, and <10 people in the clinic.  The  patient's COVID risk of complications score is 3.  Pt brought in splint, it appears to fit well, pt reports it is too tight at time, therapist instructed pt to loosen straps prn. Supine over pool noodle for  For gentle pect stretch with arms in abduction, pillow under RUE. Supine bilateral shoulder abduction followed by closed chain shoulder flexion with therapist facilitating shoulder/ scapular positioning. Fluidotherapy to right hand and wrist x 9 mins due to pain and stiffness. No adverse reactions.Gentle passive stretch with right wrist in flexion/ extension and fingers with gentle distraction. Scrubbing tabletop with RUE for gentle weightbearing and desensitization. Seated weightbearing through right elbow with body on arm movements and trunk rotation with therapist facilitating posture and scapula, mod facillitation UE ranger for AA/ROM mid range shoulder flexion mod facilitation for proper positioning.                         OT Short Term Goals - 07/21/18 1452      OT SHORT TERM GOAL #1   Title  I with inital HEP.    Time  6    Period  Weeks  Status  Achieved    Target Date  07/11/18      OT SHORT TERM GOAL #2   Title  Pt will demonstrate  improved RUE functional use as evidenced by increasing box/ blocks score by 8 blocks.    Baseline  RUE 48, LUE 75    Time  6    Period  Weeks    Status  On-going      OT SHORT TERM GOAL #3   Title  Pt will demonstrate ability to retrieve a light weight object from overhead shelf at 120 shoulder flexion with no more than min compensations.    Time  6    Period  Weeks    Status  On-going   07/13/18:  95*     OT SHORT TERM GOAL #4   Title  Pt will perfrom all basic ADLS modified independently.    Time  6    Period  Weeks    Status  Achieved   07/13/18     OT SHORT TERM GOAL #5   Title  Pt will increase RUE grip strength to 35 lbs of greater for increased functional use.    Time  6    Period  Weeks    Status   On-going   07/13/18:  2lbs     OT SHORT TERM GOAL #6   Title  Pt will report using his RUE to assist with ADLs at least 50% of the time or greater.    Time  12    Period  Weeks    Status  Achieved   07/13/18: met per pt report       OT Long Term Goals - 05/27/18 1326      OT LONG TERM GOAL #1   Title  Pt will demonstrate ability to retrieve a 3 lbs weight from overhead shelf demonstrating good control with RUE.    Time  12    Period  Weeks    Status  New    Target Date  08/25/18      OT LONG TERM GOAL #2   Title  Pt will perform mod complex home managment modified independently.    Time  12    Period  Weeks    Status  New      OT LONG TERM GOAL #3   Title  Pt will demonstrate improved RUE fine motor coordination for ADLS as evidenced by completing 9 hole peg test in 38 secs or less.    Time  12    Period  Weeks    Status  New      OT LONG TERM GOAL #4   Title  Pt will report that he has returned to using his RUE as dominant hand at least 75% of the time for ADLs/IADLs..    Time  12    Period  Weeks    Status  New      OT LONG TERM GOAL #5   Title  Pt will demonstrate ability to write his name and address legibily using RUE.    Time  12    Period  Weeks    Status  New      Long Term Additional Goals   Additional Long Term Goals  Yes      OT LONG TERM GOAL #6   Title  Pt will perform simulated work activities modified indpendently demonstrating good safety awareness.    Time  12    Period  Weeks  Status  New            Plan - 08/20/18 1122    Clinical Impression Statement  Pt progressing slowly towards goals. Pt remains limited by shoulder and hand pain and stiffness.     Occupational performance deficits (Please refer to evaluation for details):  ADL's;IADL's;Work;Leisure;Social Participation    Body Structure / Function / Physical Skills  ADL;Dexterity;Flexibility;ROM;Strength;IADL;FMC;Coordination;Balance;UE functional use;GMC;Gait;Endurance;Decreased  knowledge of precautions;Sensation    Rehab Potential  Good    OT Frequency  2x / week    OT Duration  8 weeks    OT Treatment/Interventions  Self-care/ADL training;Cryotherapy;Paraffin;Therapeutic exercise;DME and/or AE instruction;Functional Mobility Training;Balance training;Splinting;Manual Therapy;Neuromuscular education;Fluidtherapy;Ultrasound;Aquatic Therapy;Electrical Stimulation;Moist Heat;Contrast Bath;Energy conservation;Passive range of motion;Therapeutic activities;Patient/family education    Plan  pain reduction strategies,  continue NMR and scapula depression and hand function/ROM    Consulted and Agree with Plan of Care  Patient    Family Member Consulted  dtr who interprets for him       Patient will benefit from skilled therapeutic intervention in order to improve the following deficits and impairments:  Body Structure / Function / Physical Skills  Visit Diagnosis: Hemiplegia and hemiparesis following cerebral infarction affecting right dominant side (HCC)  Muscle weakness (generalized)  Other lack of coordination  Other abnormalities of gait and mobility    Problem List Patient Active Problem List   Diagnosis Date Noted  . 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 Left pontine cerebrovascular accident ([place]) 05/11/2018  . Tobacco abuse   . Uncontrolled type 2 diabetes mellitus with hyperglycemia ([place])   . CVA (cerebral vascular accident) ([place]) 05/06/2018  . Hyperlipidemia 05/30/2017    RINE,KATHRYN 08/20/2018, 1:20 PM  [facility] [address] [place], [place], [address] Phone: [phone]   Fax:  [phone]  Name: Arthur Howard MRN: [id number] Date of Birth: May 27, 1965

## 2018-08-20 NOTE — Therapy (Signed)
[facility] [address] [place], [place], [address] Phone: [phone]   Fax:  [phone]  Physical Therapy Treatment  Patient Details  Name: Arthur Howard MRN: [id number] Date of Birth: 1965-06-13 Referring Provider (PT): Bretta Bang referred from hospital; f/u is with Venancio Poisson, NP   Encounter Date: 08/20/2018 CLINIC OPERATION CHANGES: [facility] is operating at a low capacity due to COVID-19.  The patient was brought into the clinic for evaluation and/or treatment following universal masking by staff, social distancing, and <10 people in the clinic.  The patient's COVID risk of complications score is 3.  PT End of Session - 08/20/18 1651    Visit Number  23    Number of Visits  30   per recert   Date for PT Re-Evaluation  09/25/18   per 4 week recert   Authorization Type  [employer]-60 visits combined PT and OT if on same day    Authorization - Visit Number  23    Authorization - Number of Visits  60    PT Start Time  1027    PT Stop Time  1228    PT Time Calculation (min)  40 min    Activity Tolerance  Patient tolerated treatment well;No increased pain    Behavior During Therapy  WFL for tasks assessed/performed       Past Medical History:  Diagnosis Date  . Diabetes mellitus without complication ([place])   . Hyperlipidemia     Past Surgical History:  Procedure Laterality Date  . ANKLE SURGERY      There were no vitals filed for this visit.  Subjective Assessment - 08/20/18 1151    Subjective  Right now the pain has decreased to 1, after OT.  Dry needling has helped his arm/shoulder pain.    Patient is accompained by:  Family member    Patient Stated Goals  Pt's goal is to improve R hand, arm, leg strength.    Currently in Pain?  Yes    Pain Score  1     Pain Location  Arm    Pain Orientation  Right    Pain Descriptors / Indicators  Aching    Pain Onset   More than a month ago    Pain Frequency  Constant    Aggravating Factors   malpositioning    Pain Relieving Factors  repositioning                       [facility] Adult PT Treatment/Exercise - 08/20/18 0001      Ambulation/Gait   Ambulation/Gait  Yes    Ambulation/Gait Assistance  6: Modified independent (Device/Increase time)    Ambulation/Gait Assistance Details  Indoor/outdoor compliant surfaces, obstacles no device    Ambulation Distance (Feet)  500 Feet   outdoors, then 500 ft indoors   Assistive device  None    Gait Pattern  Step-through pattern;Decreased arm swing - right;Decreased dorsiflexion - right    Ambulation Surface  Level;Unlevel   Grass, gravel, mulch-stepping over and around obstacles   Gait Comments  With indoor gait, cues provided for increased gait speed, as pt has improved timing/coordination of RLE and RUE with gait with faster speeds      High Level Balance   High Level Balance Activities  Braiding;Marching forwards;Tandem walking    High Level Balance Comments  Progressed to tandem marching.  SLS RLE and LLE, 3 reps x 10 seconds  with minimal UE support, EC      Exercises   Exercises  Other Exercises;Knee/Hip    Other Exercises   Core stability exercises in sitting on green balance disk:  anterior/posterior pelvic tilts 2 sets x 10 reps, then with upright posture:  marching in place x 10, then cues for upright posture, LAQ.  In tall kneeling:  cues for upright posture:  tall kneel to sit back on heels, tall kneeling side stepping R and L x 10 reps; then tall kneel to 1/2 kneel x 5 reps (cues for upright posture); then 1/2 kneel to stand through RLE x 8 reps.      Knee/Hip Exercises: Standing   Forward Step Up  Right;1 set;10 reps;Hand Hold: 1;Step Height: 6"   then 2nd set of 10:  14" step RLE step up x 8 reps     Knee/Hip Exercises: Supine   Bridges  Strengthening;Both;1 set;10 reps    Bridges with Clamshell  Strengthening;Both;1 set;10 reps     Single Leg Bridge  Right;Left;Strengthening;1 set;5 reps    Other Supine Knee/Hip Exercises  Bridging with marching x 10 reps               PT Short Term Goals - 08/19/18 1453      PT SHORT TERM GOAL #1   Title  = LTG        PT Long Term Goals - 08/19/18 1453      PT LONG TERM GOAL #1   Title  The patient will be independent with progression of HEP for discharge.    Baseline  08/18/18: met with current HEP    Time  4    Period  Weeks    Status  Revised    Target Date  09/25/18      PT LONG TERM GOAL #2   Title  The patient will demonstrate return to climbing an A frame ladder with use of RUE and independent ability to balance, step up with R LE for return to household and work activities.    Time  4    Period  Weeks    Status  Revised    Target Date  09/25/18      PT LONG TERM GOAL #3   Title  Pt will demonstrate improvement in overall function as indicated by FOTO score of 77    Baseline  67% functional intake    Time  4    Period  Weeks    Status  New    Target Date  09/25/18      PT LONG TERM GOAL #4   Title  The patient will be able to return to yard work activities (discusses wanting to mow the yard-- fairly level surface, however this may depend more on R shoulder pain than balance/gait).      Baseline  08/18/18: has started with mowing, however needs help to start mower and only able push with left UE. Only mowed for 10-15 minute increments so not to fatigue.     Time  4    Period  Weeks    Status  Revised    Target Date  09/25/18      PT LONG TERM GOAL #5   Title  The patient will be able to walk while carrying items (grocery bags, household items, etc).      Baseline  08/18/18: has been some, ~20-30% return to pre stroke activity    Time  4    Period  Weeks    Status  Revised    Target Date  09/25/18            Plan - 08/20/18 1654    Clinical Impression Statement  Skilled PT session today focused on postural re-education and strengthening  as well as high level balance and stregnthening activities in preparation for return to work activities.  Pt will continue to benefit from postural re-education/strengthening, dry needling, practice for job-related activities.    Personal Factors and Comorbidities  Comorbidity 2;Past/Current Experience    Comorbidities  hyperlipidemia, DM    Examination-Activity Limitations  Carry;Stairs;Locomotion Level;Transfers    Examination-Participation Restrictions  Community Activity;Yard Work;Other    Stability/Clinical Decision Making  Evolving/Moderate complexity    Rehab Potential  Good    PT Frequency  2x / week    PT Duration  8 weeks   anticipate D/C in 4   PT Treatment/Interventions  ADLs/Self Care Home Management;Electrical Stimulation;DME Instruction;Gait training;Stair training;Functional mobility training;Therapeutic activities;Therapeutic exercise;Orthotic Fit/Training;Patient/family education;Neuromuscular re-education;Balance training;Manual techniques;Spinal Manipulations;Aquatic Therapy;Cryotherapy;Moist Heat;Passive range of motion;Dry needling    PT Next Visit Plan  RUE dry needling as indicated (trying to get back on Audra); work on high level strengtheing/return to work activities (high step-ups, floor to stand, ladder, agility)    PT Home Exercise Plan  Access Code: [email or web address] , shoulder stretches: [email or web address]    Consulted and Agree with Plan of Care  Patient;Family member/caregiver    Family Member Consulted  Daughter       Patient will benefit from skilled therapeutic intervention in order to improve the following deficits and impairments:  Abnormal gait, Decreased balance, Decreased mobility, Difficulty walking, Decreased strength, Impaired flexibility, Impaired tone, Postural dysfunction, Pain, Increased muscle spasms, Decreased range of motion  Visit Diagnosis: Abnormal posture  Unsteadiness on feet  Muscle weakness (generalized)     Problem List Patient Active Problem  List   Diagnosis Date Noted  . 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 ([place]) 08/02/2018  . Left pontine cerebrovascular accident ([place]) 05/11/2018  . Tobacco abuse   . Uncontrolled type 2 diabetes mellitus with hyperglycemia ([place])   . CVA (cerebral vascular accident) ([place]) 05/06/2018  . Hyperlipidemia 05/30/2017    Pernie Grosso W. 08/20/2018, 4:58 PM Frazier Butt., PT  [facility] [address] [place], [place], [address] Phone: [phone]   Fax:  [phone]  Name: Arthur Howard MRN: [id number] Date of Birth: 1965-11-18

## 2018-08-23 ENCOUNTER — Ambulatory Visit: Payer: Commercial Indemnity | Admitting: Physical Medicine & Rehabilitation

## 2018-08-23 NOTE — Progress Notes (Signed)
NEUROLOGY CONSULTATION NOTE  [name] MRN: [id number] DOB: Oct 13, 1965  Referring provider: Alysia Penna, MD Primary care provider: Lelon Frohlich, MD  Reason for consult:  Cerebrovascular accident  HISTORY OF PRESENT ILLNESS: Arthur Howard is a 53 year old right-handed man with type 2 diabetes and hyperlipidemia who presents for stroke.  History supplemented by hospital and rehab records.  CT, CTA and MRI performed in hospital personally reviewed.  He is accompanied by his daughter who supplements history.  Arthur Howard was admitted to [facility] from 05/06/18 to 05/11/18 for stroke after waking up in the morning with some slurred speech and right upper and lower extremity weakness and numbness.  CT of head demonstrated small age indeterminate infarct involving the posterior limb of the right internal capsule (which subsequently was shown to be chronic on MRI).  CTA of head ane neck revealed no large vessel occlusion or stenosis.  MRI of brain demonstrated small acute left inferior ventral pontine infarct as well as chronic small vessel ischemic changges and old right basal ganglia lacunar infarct.  2D echo showed EF 6-65% with no source of embolus.  LDL was 133.  Hgb A1c was 11.3.  Telemetry revealed no arrhythmia.  He was started on ASA 50m and Plavix 760mfor secondary stroke prevention.  His Lipitor was increased from 4053mo 56m64mily.  Diabetes management was optimized.  He was discharged to inpatient rehab and subsequently discharged home with outpatient rehab.  He is now off Plavix but remains on ASA.  He still participates in outpatient physical therapy.  He has been experiencing right arm pain.  He also notes tingling in the right posterior aspect of his head.    PAST MEDICAL HISTORY: Past Medical History:  Diagnosis Date  . Diabetes mellitus without complication (HCC)[place]. Hyperlipidemia     PAST SURGICAL HISTORY: Past Surgical History:  Procedure  Laterality Date  . ANKLE SURGERY      MEDICATIONS: Current Outpatient Medications on File Prior to Visit  Medication Sig Dispense Refill  . acetaminophen (TYLENOL) 325 MG tablet Take 2 tablets (650 mg total) by mouth every 4 (four) hours as needed for mild pain (or temp > 37.5 C (99.5 F)).    . asMarland Kitchenirin EC 81 MG EC tablet Take 1 tablet (81 mg total) by mouth daily.    . atMarland Kitchenrvastatin (LIPITOR) 80 MG tablet Take 1 tablet (80 mg total) by mouth daily. 90 tablet 0  . blood glucose meter kit and supplies KIT Dispense based on patient and insurance preference. Use up to four times daily as directed. (FOR ICD-9 250.00, 250.01). 1 each 0  . clopidogrel (PLAVIX) 75 MG tablet Take 1 tablet (75 mg total) by mouth daily. 21 tablet 0  . diclofenac sodium (VOLTAREN) 1 % GEL Apply 2 g topically 4 (four) times daily. 1 Tube 3  . gabapentin (NEURONTIN) 300 MG capsule Take 1 capsule (300 mg total) by mouth 2 (two) times daily. 60 capsule 1  . glipiZIDE (GLUCOTROL) 10 MG tablet Take 1 tablet (10 mg total) by mouth 2 (two) times daily before a meal. 180 tablet 0  . metFORMIN (GLUCOPHAGE) 500 MG tablet Take 1 tablet (500 mg total) by mouth 2 (two) times daily with a meal. 180 tablet 3  . predniSONE (DELTASONE) 10 MG tablet Take 4 tablets by mouth every morning for 3 days, then 3 tablets every morning for 3 days, then 2 tablets every morning for 3 days, then 1  tablet by mouth every morning for 3 days then STOP 30 tablet 0  . traMADol (ULTRAM) 50 MG tablet Take 1 tablet (50 mg total) by mouth at bedtime. 20 tablet 0   No current facility-administered medications on file prior to visit.     ALLERGIES: No Known Allergies  FAMILY HISTORY: Family History  Problem Relation Age of Onset  . Diabetes Sister   . Diabetes Brother   . CVA Neg Hx    SOCIAL HISTORY: Social History   Socioeconomic History  . Marital status: Married    Spouse name: Not on file  . Number of children: Not on file  . Years of  education: Not on file  . Highest education level: Not on file  Occupational History  . Occupation: Sales executive    Comment: [employer]  Social Needs  . Financial resource strain: Not on file  . Food insecurity:    Worry: Not on file    Inability: Not on file  . Transportation needs:    Medical: Not on file    Non-medical: Not on file  Tobacco Use  . Smoking status: Former Research scientist (life sciences)  . Smokeless tobacco: Never Used  . Tobacco comment: never a daily smoker  Substance and Sexual Activity  . Alcohol use: No    Alcohol/week: 0.0 standard drinks  . Drug use: No  . Sexual activity: Not on file  Lifestyle  . Physical activity:    Days per week: Not on file    Minutes per session: Not on file  . Stress: Not on file  Relationships  . Social connections:    Talks on phone: Not on file    Gets together: Not on file    Attends religious service: Not on file    Active member of club or organization: Not on file    Attends meetings of clubs or organizations: Not on file    Relationship status: Not on file  . Intimate partner violence:    Fear of current or ex partner: Not on file    Emotionally abused: Not on file    Physically abused: Not on file    Forced sexual activity: Not on file  Other Topics Concern  . Not on file  Social History Narrative   Works with Sales executive. Lives in [place]. Moved from [place] 35 years ago. He is married. Has 7 children (all girls).     REVIEW OF SYSTEMS: Constitutional: No fevers, chills, or sweats, no generalized fatigue, change in appetite Eyes: No visual changes, double vision, eye pain Ear, nose and throat: No hearing loss, ear pain, nasal congestion, sore throat Cardiovascular: No chest pain, palpitations Respiratory:  No shortness of breath at rest or with exertion, wheezes GastrointestinaI: No nausea, vomiting, diarrhea, abdominal pain, fecal incontinence Genitourinary:  No dysuria, urinary retention or frequency Musculoskeletal:  Right arm pain  Integumentary: No rash, pruritus, skin lesions Neurological: as above Psychiatric: No depression, insomnia, anxiety Endocrine: No palpitations, fatigue, diaphoresis, mood swings, change in appetite, change in weight, increased thirst Hematologic/Lymphatic:  No purpura, petechiae. Allergic/Immunologic: no itchy/runny eyes, nasal congestion, recent allergic reactions, rashes  PHYSICAL EXAM: Blood pressure 136/76, pulse 60, temperature 98 F (36.7 C), height '5\' 5"'  (1.651 m), weight 153 lb 3.2 oz (69.5 kg), SpO2 98 %. General: No acute distress.  Patient appears well-groomed.   Head:  Normocephalic/atraumatic Eyes:  fundi examined but not visualized Neck: supple, no paraspinal tenderness, full range of motion Back: No paraspinal tenderness Heart: regular rate and rhythm Lungs:  Clear to auscultation bilaterally. Vascular: No carotid bruits. Neurological Exam: Mental status: alert and oriented to person, place, and time, recent and remote memory intact, fund of knowledge intact, attention and concentration intact, speech fluent and not dysarthric, language intact. Cranial nerves: CN I: not tested CN II: pupils equal, round and reactive to light, visual fields intact CN III, IV, VI:  full range of motion, no nystagmus, no ptosis CN V: facial sensation intact CN VII: upper and lower face symmetric CN VIII: hearing intact CN IX, X: gag intact, uvula midline CN XI: sternocleidomastoid and trapezius muscles intact CN XII: tongue midline Bulk & Tone: normal, no fasciculations. Motor:  5-/5 right upper extremity and right hip flexion.  Otherwise, 5/5 throughout Sensation:  Pinprick and vibration sensation intact.  Deep Tendon Reflexes:  3+ right upper and lower extremities, 2+ left sided, toes downgoing.   Finger to nose testing:  Without dysmetria.   Heel to shin:  Without dysmetria.   Gait:  Normal station and stride.  Able to turn and tandem walk. Romberg negative.  IMPRESSION: 1.   Right sided hemiplegia secondary to small left pontine infarct secondary to small vessel disease 2.  Type 2 diabetes mellitus 3.  Hyperlipidemia.   PLAN: Secondary stroke prevention management as per PCP: 1.  ASA 42m daily for secondary stroke prevention.   2.  Lipitor 880mdaily (LDL goal less than 70) 3.  Blood pressure management (goal less than 130/90) 4.  Glycemic control (Hgb A1c goal less than 7) 5.  Mediterranean diet 6.  Routine exercise 7.  Follow up in 6 months.  Thank you for allowing me to take part in the care of this patient.  AdMetta ClinesDO  CC: EsLelon FrohlichMD

## 2018-08-24 ENCOUNTER — Ambulatory Visit: Payer: Commercial Indemnity | Attending: Physician Assistant | Admitting: Occupational Therapy

## 2018-08-24 ENCOUNTER — Ambulatory Visit: Payer: Commercial Indemnity | Admitting: Physical Therapy

## 2018-08-24 ENCOUNTER — Ambulatory Visit (INDEPENDENT_AMBULATORY_CARE_PROVIDER_SITE_OTHER): Payer: Commercial Indemnity | Admitting: Neurology

## 2018-08-24 ENCOUNTER — Encounter: Payer: Self-pay | Admitting: Physical Therapy

## 2018-08-24 ENCOUNTER — Other Ambulatory Visit: Payer: Self-pay

## 2018-08-24 ENCOUNTER — Encounter: Payer: Self-pay | Admitting: Neurology

## 2018-08-24 VITALS — BP 136/76 | HR 60 | Temp 98.0°F | Ht 65.0 in | Wt 153.2 lb

## 2018-08-24 DIAGNOSIS — R2681 Unsteadiness on feet: Secondary | ICD-10-CM

## 2018-08-24 DIAGNOSIS — R293 Abnormal posture: Secondary | ICD-10-CM

## 2018-08-24 DIAGNOSIS — M25511 Pain in right shoulder: Secondary | ICD-10-CM | POA: Insufficient documentation

## 2018-08-24 DIAGNOSIS — M6281 Muscle weakness (generalized): Secondary | ICD-10-CM

## 2018-08-24 DIAGNOSIS — M79641 Pain in right hand: Secondary | ICD-10-CM | POA: Diagnosis present

## 2018-08-24 DIAGNOSIS — I6350 Cerebral infarction due to unspecified occlusion or stenosis of unspecified cerebral artery: Secondary | ICD-10-CM

## 2018-08-24 DIAGNOSIS — E7849 Other hyperlipidemia: Secondary | ICD-10-CM | POA: Diagnosis not present

## 2018-08-24 DIAGNOSIS — R2689 Other abnormalities of gait and mobility: Secondary | ICD-10-CM | POA: Diagnosis present

## 2018-08-24 DIAGNOSIS — I69351 Hemiplegia and hemiparesis following cerebral infarction affecting right dominant side: Secondary | ICD-10-CM | POA: Diagnosis not present

## 2018-08-24 DIAGNOSIS — R278 Other lack of coordination: Secondary | ICD-10-CM | POA: Diagnosis present

## 2018-08-24 DIAGNOSIS — E1165 Type 2 diabetes mellitus with hyperglycemia: Secondary | ICD-10-CM

## 2018-08-24 DIAGNOSIS — I639 Cerebral infarction, unspecified: Secondary | ICD-10-CM

## 2018-08-24 NOTE — Patient Instructions (Signed)
1.  Continue aspirin 81mg  daily 2.  Continue atorvastatin 80mg  daily 3.  Continue diabetes medications. 4.  Continue physical therapy with Dr. Letta Pate and therapist 5.  Mediterranean diet (see below) 6.  Follow up in 6 months.   Dieta mediterrnea Mediterranean Diet El trmino "dieta mediterrnea" hace referencia a elecciones de alimentacin y de estilo de vida basadas en las tradiciones de los pases ubicados en las costas del mar Mediterrneo. Se ha demostrado que esta dieta ayuda a prevenir determinadas afecciones y a [employer] que padecen enfermedades crnicas, como enfermedades renales y cardacas. Consejos para seguir Camera operator de vida  Cocine y coma en familia, cuando sea posible.  Beba suficiente lquido como para mantener la orina clara o de color amarillo plido.  [place]. Esto incluye lo siguiente: ? Ejercicios aerbicos, como nadar o correr. ? Actividades recreativas, como jardinera, caminatas o tareas domsticas.  Intente dormir de 7 a 8horas todas las noches.  Si el mdico se lo recomienda, consuma vino tinto con moderacin. Esto significa 1vaso por da para mujeres que no estn embarazadas y 2vasos por da para los hombres. Un vaso de vino equivale a 5oz (124ml). Leer las etiquetas de los alimentos   Verificar el tamao de la porcin de los alimentos envasados. En el caso del arroz y la pasta, el tamao de la porcin se refiere a la cantidad de alimento cocido, no seco.  Counselling psychologist de las grasas totales de los alimentos envasados. Evite los que contienen grasas saturadas o trans.  Lea la lista de ingredientes para determinar si los alimentos contienen azcares agregados, como jarabe de maz. De compras  En el supermercado, compre la [employer] de los alimentos en las reas cercanas a las paredes del edificio. Esto incluye lo siguiente: ? Lambert Mody y verduras frescas. ? Cereales, frijoles, frutos  secos y semillas. Algunos de estos productos pueden estar disponibles sueltos o en grandes cantidades (a granel). ? Marketing executive. ? Aves y [place]. ? Productos lcteos descremados.  Compre ingredientes integrales en lugar de alimentos preenvasados.  Compre frutas y verduras de estacin frescas en mercados de granjeros locales.  Compre frutas y verduras congeladas en bolsas hermticas.  Si no tiene acceso a Marketing executive de buena calidad, compre camarones congelados precocidos o pescado en lata, como atn, salmn o sardinas.  Compre pequeas cantidades de verduras crudas o cocidas, ensaladas o aceitunas en la seccin de ensaladas o de charcutera de la tienda.  Surta su despensa para tener siempre determinados productos a mano, por ejemplo, aceite de oliva, atn en lata, tomates en lata, arroz, pasta y frijoles. Coccin  Cocine con aceite de oliva extra virgen en lugar de usar mantequilla u otros aceites vegetales.  Use las carnes como guarnicin y las verduras o los cereales como plato principal. Esto significa consumir porciones pequeas de carne o agregarla a la pasta o a los guisos en pequeas cantidades.  Use frijoles o verduras en lugar de carne en platos comunes como chili o lasaa.  Experimente con diferentes mtodos de coccin. Intente asar las verduras en lugar de cocerlas al vapor o saltearlas.  Agregue verduras congeladas a sopas, guisos, pasta o arroz.  Agregue frutos secos o semillas para cubrir la porcin de grasas saludables en cada comida. Puede agregarlos a yogures, ensaladas o platos con verduras.  Nicholson verduras con aceite de [place], [place] de [place], ajo y Patent attorney. Planificacin de las comidas  Planifique 1comida vegetariana por da todas las semanas. Si es posible, intente agregar hasta 2.  Coma mariscos 2o ms veces por semana.  Tenga a mano bocadillos saludables, por ejemplo: ? Bastones de verduras con humus. ? Yogur  griego. ? Mezcla de frutos secos y frutas.  Consuma comidas equilibradas toda la semana. Esto incluye lo siguiente: ? Frutas: 2 a 3porciones por da ? Verduras: 4 a 5porciones por da ? Lcteos con bajo contenido de grasa: 2porciones por da ? Pescado, aves o carne magra: 1porcin por da ? Frijoles y legumbres: 2porciones o ms por semana ? Frutos secos y semillas: 1 a 2porciones por da ? Cereales integrales: 6 a 8porciones por da ? Aceite de oliva extra virgen: 3 a 4porciones por da  Limite las carnes rojas y los dulces a solo unas porciones al mes. Qu alimentos elijo?  Dieta mediterrnea ? Recomendados ? Cereales: Pastas integrales. Arroz integral. Gavin Potters burgol. Polenta. Cuscs. Pan integral. Avena. Quinua. ? Verduras: Alcachofas. Remolachas. Brcoli. Repollo. Zanahorias. Augustin Coupe. Judas verdes. Acelga. Col rizada. Espinaca. Cebollas. Puerro. Guisantes. Calabaza. Tomates. Pimientos. Rbanos. ? Frutas: Manzanas. Damascos. Aguacate. Frutos rojos. Bananas. Cerezas. Dtiles. Higos. Uvas. Limones. Meln. Naranjas. Duraznos. Ciruelas. Leroy. ? Carnes y otros alimentos ricos en protenas: Frijoles. Almendras. Semillas de girasol. Piones. Manes. Bacalao. Salmn. Vieiras. Camarones. Atn. Tilapia. Almejas. Ostras. Huevos. ? Lcteos: Leche con bajo contenido de [place]. Queso. Yogur griego. ? BebidasGrayce Sessions. Vino tinto. T de hierbas. ? Grasas y aceites: Aceite de oliva extra virgen. Aceite de aguacate. Aceite de pepitas de uva. ? Dulces y postres: Yogur griego con miel. Manzanas asadas. Peras asadas. Mezcla de frutos secos. ? Condimentos y otros alimentos: [place]. Cilantro. Coriandro. Comino. Menta. Perejil. Salvia. Westley Hummer. Estragn. Ajo. Organo. Tomillo. Pimienta. Shiner. Hummus. Salsa de tomate. Aceitunas. Hongos. ? Limite ? Cereales: Comidas con arroz o pasta preenvasada. Cereales preenvasados con azcar agregada. ? Verduras: Patatas fritas. ? Frutas:  Frutas enlatadas con almbar. ? Carnes y otros alimentos ricos en protenas: Carne de vaca. Cerdo. Cordero. Carne de ave con piel. Perros calientes. Tocino. ? Lcteos: Helados. [employer]. Leche entera. ? Bebidas: Jugos. Refrescos endulzados con azcar. Cerveza. Licores y bebidas espirituosas. ? Grasas y aceites: Mantequilla. Aceite de canola. Aceite vegetal. Grasa de carne de res. Platter. ? Dulces y postres: Galletas. Bizcochuelos. Pasteles. Caramelos. ? Condimentos y otros alimentos: Mayonesa. Salsas y adobos listos para consumir. ? Esta podra no ser [employer]. Hable con el nutricionista sobre las opciones de alimentos ms adecuadas para usted. Resumen  La dieta mediterrnea implica elecciones de alimentos y de estilo de vida.  Consuma una variedad de frutas y verduras frescas, frijoles, frutos secos, semillas y cereales integrales.  Limite la cantidad de carnes rojas y dulces.  Pregntele al mdico si puede beber vino tinto con moderacin. Esto significa 1vaso por da para mujeres que no estn embarazadas y 2vasos por da para los hombres. Un vaso de vino equivale a 5oz (135ml). Esta informacin no tiene Marine scientist el consejo del mdico. Asegrese de hacerle al mdico cualquier pregunta que tenga. Document Released: 10/27/2016 Document Revised: 10/27/2016 Elsevier Interactive Patient Education  [employer].

## 2018-08-24 NOTE — Therapy (Signed)
[facility] [address] [place], [place], [address] Phone: [phone]   Fax:  [phone]  Physical Therapy Treatment  Patient Details  Name: Arthur Howard MRN: [id number] Date of Birth: Nov 27, 1965 Referring Provider (PT): Bretta Bang referred from hospital; f/u is with Venancio Poisson, NP   Encounter Date: 08/24/2018 CLINIC OPERATION CHANGES: [facility]patient [facility] is open at lower capacity following universal masking, social distancing, and patient screening.  The patient's COVID risk of complications score is 3.  PT End of Session - 08/24/18 1456    Visit Number  24    Number of Visits  30   per recert   Date for PT Re-Evaluation  09/25/18   per 4 week recert   Authorization Type  Cigna-60 visits combined PT and OT if on same day    Authorization - Visit Number  24    Authorization - Number of Visits  60    Activity Tolerance  Patient tolerated treatment well;No increased pain    Behavior During Therapy  WFL for tasks assessed/performed       Past Medical History:  Diagnosis Date  . Diabetes mellitus without complication ([place])   . Hyperlipidemia     Past Surgical History:  Procedure Laterality Date  . ANKLE SURGERY      There were no vitals filed for this visit.  Subjective Assessment - 08/24/18 1103    Subjective  Right now, the pain in my arm is 1/10.  Been doing my exercises.  Only thing is my R foot gets tired after about 50 min-1 hr.    Patient is accompained by:  Family member    Patient Stated Goals  Pt's goal is to improve R hand, arm, leg strength.    Currently in Pain?  Yes    Pain Score  1     Pain Location  Hand    Pain Orientation  Right    Pain Descriptors / Indicators  Aching    Pain Type  Chronic pain    Pain Onset  More than a month ago    Pain Frequency  Constant    Aggravating Factors   use    Pain Relieving Factors  repositioning                        [facility] Adult PT Treatment/Exercise - 08/24/18 0001      High Level Balance   High Level Balance Comments  Standing on compliant surface on ramp incline:  sidestepping up and down x 3 reps each leg leading      Therapeutic Activites    Therapeutic Activities  Other Therapeutic Activities    Other Therapeutic Activities  A-frame Ladder negotiation, x 3 reps, 3-4 steps:  pt ascends with step-to pattern, descends in reverse, one step at a time (states he used to go up ladders one step/one leg at a time)      Exercises   Exercises  Other Exercises;Knee/Hip    Other Exercises   Core stability exercises in sitting on blue therrapy ball:  anterior/posterior pelvic tilts 2 sets x 10 reps, then lateral pelvic tilts x 10 reps;  with upright posture:  marching in place x 10, then cues for upright posture, LAQ.  In tall kneeling:  cues for upright posture:  tall kneel to 1/2 kneel x 5 reps (cues for upright posture); then 1/2 kneel to stand through RLE x 8 reps.  Seated on  ball:  head turns/nods x 8 reps each with EO and EC          Balance Exercises - 08/24/18 1123      Balance Exercises: Standing   Tandem Stance  Eyes open;Foam/compliant surface;2 reps;30 secs    SLS  Eyes open;Solid surface;Foam/compliant surface;Intermittent upper extremity support;5 reps   up to 10 seconds, cues for posture   SLS with Vectors  Foam/compliant surface;Other reps (comment);Limitations   10 reps, cone taps (single, double, triple)-alt legs   Stepping Strategy  Anterior;Posterior;Foam/compliant surface;10 reps   On ramp incline/decline-alternating legs   Marching Limitations  Marching in place x 10 reps on compliant surface incline/decline    Other Standing Exercises  Sit to stand, 2 sets x 10 reps, diagonal edge of mat, with RLE in posterior position; then forward lunges 2 sets x 10 reps          PT Short Term Goals - 08/19/18 1453      PT SHORT TERM GOAL #1   Title  =  LTG        PT Long Term Goals - 08/19/18 1453      PT LONG TERM GOAL #1   Title  The patient will be independent with progression of HEP for discharge.    Baseline  08/18/18: met with current HEP    Time  4    Period  Weeks    Status  Revised    Target Date  09/25/18      PT LONG TERM GOAL #2   Title  The patient will demonstrate return to climbing an A frame ladder with use of RUE and independent ability to balance, step up with R LE for return to household and work activities.    Time  4    Period  Weeks    Status  Revised    Target Date  09/25/18      PT LONG TERM GOAL #3   Title  Pt will demonstrate improvement in overall function as indicated by FOTO score of 77    Baseline  67% functional intake    Time  4    Period  Weeks    Status  New    Target Date  09/25/18      PT LONG TERM GOAL #4   Title  The patient will be able to return to yard work activities (discusses wanting to mow the yard-- fairly level surface, however this may depend more on R shoulder pain than balance/gait).      Baseline  08/18/18: has started with mowing, however needs help to start mower and only able push with left UE. Only mowed for 10-15 minute increments so not to fatigue.     Time  4    Period  Weeks    Status  Revised    Target Date  09/25/18      PT LONG TERM GOAL #5   Title  The patient will be able to walk while carrying items (grocery bags, household items, etc).      Baseline  08/18/18: has been some, ~20-30% return to pre stroke activity    Time  4    Period  Weeks    Status  Revised    Target Date  09/25/18            Plan - 08/24/18 1456    Clinical Impression Statement  Skilled PT session today focused on postural re-education and strengthening as well as high level  balance/strengthening activities.  Progressed today to SLS on foam without UE support, with pt having increased trunk sway and decreased time for SLS (when not able to use counter for support).  He ascends  ladder with step-to pattern, which he says is different from step-through pattern independently prior to CVA.  Pt will continue to benefit from postural re-education/strenghtening, dry needling (as able), practice for job-related activities.    Personal Factors and Comorbidities  Comorbidity 2;Past/Current Experience    Comorbidities  hyperlipidemia, DM    Examination-Activity Limitations  Carry;Stairs;Locomotion Level;Transfers    Examination-Participation Restrictions  Community Activity;Yard Work;Other    Stability/Clinical Decision Making  Evolving/Moderate complexity    Rehab Potential  Good    PT Frequency  2x / week    PT Duration  8 weeks   anticipate D/C in 4   PT Treatment/Interventions  ADLs/Self Care Home Management;Electrical Stimulation;DME Instruction;Gait training;Stair training;Functional mobility training;Therapeutic activities;Therapeutic exercise;Orthotic Fit/Training;Patient/family education;Neuromuscular re-education;Balance training;Manual techniques;Spinal Manipulations;Aquatic Therapy;Cryotherapy;Moist Heat;Passive range of motion;Dry needling    PT Next Visit Plan  RUE dry needling as indicated (trying to get back on Audra); work on high level strengtheing/return to work activities (high step-ups, floor to stand, ladder, agility)    PT Home Exercise Plan  Access Code: [email or web address] , shoulder stretches: [email or web address]    Consulted and Agree with Plan of Care  Patient;Family member/caregiver    Family Member Consulted  Daughter       Patient will benefit from skilled therapeutic intervention in order to improve the following deficits and impairments:  Abnormal gait, Decreased balance, Decreased mobility, Difficulty walking, Decreased strength, Impaired flexibility, Impaired tone, Postural dysfunction, Pain, Increased muscle spasms, Decreased range of motion  Visit Diagnosis: Muscle weakness (generalized)  Abnormal posture  Unsteadiness on feet     Problem List Patient  Active Problem List   Diagnosis Date Noted  . 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 ([place]) 08/02/2018  . Left pontine cerebrovascular accident ([place]) 05/11/2018  . Tobacco abuse   . Uncontrolled type 2 diabetes mellitus with hyperglycemia ([place])   . CVA (cerebral vascular accident) ([place]) 05/06/2018  . Hyperlipidemia 05/30/2017    Harinder Romas W. 08/24/2018, 3:00 PM Frazier Butt., PT [facility] [address] [place], [place], [address] Phone: [phone]   Fax:  [phone]  Name: Arthur Howard MRN: [id number] Date of Birth: Mar 04, 1966

## 2018-08-24 NOTE — Therapy (Signed)
[facility] [address] [place], [place], [address] Phone: [phone]   Fax:  [phone]  Occupational Therapy Treatment  Patient Details  Name: Arthur Howard MRN: [id number] Date of Birth: July 19, 1965 No data recorded  Encounter Date: 08/24/2018  OT End of Session - 08/24/18 1006    Visit Number  23    Number of Visits  33    Date for OT Re-Evaluation  08/25/18    Authorization Type  Cigna    Authorization Time Period  12 weeks, 60 visit limit, counts as 1 visit if PT/OTsame day    Authorization - Visit Number  88    Authorization - Number of Visits  76    OT Start Time  1003    OT Stop Time  1045    OT Time Calculation (min)  42 min       Past Medical History:  Diagnosis Date  . Diabetes mellitus without complication ([place])   . Hyperlipidemia     Past Surgical History:  Procedure Laterality Date  . ANKLE SURGERY      There were no vitals filed for this visit.  Subjective Assessment - 08/24/18 1006    Subjective   Pt reports his shoulder pain is better today    Pertinent History  CVA, DM, hyperlipidemia    Patient Stated Goals  recover use of RUE    Currently in Pain?  Yes    Pain Score  4     Pain Location  Hand    Pain Orientation  Right    Pain Descriptors / Indicators  Aching    Pain Type  Chronic pain    Pain Onset  More than a month ago    Pain Frequency  Constant    Aggravating Factors   use    Pain Relieving Factors  repositioning    Multiple Pain Sites  No                CLINIC OPERATION CHANGES: Outpatient [facility] is open at lower capacity following universal masking, social distancing, and patient screening.  The patient's COVID risk of complications score is 3 fluidotherapy x 10 mins with hotpack on right shoulder for pain and stiffness. A/ROM finger flexion/ extension and wrist passive stretch. Scrubbing tabletops with RUE and towel min v.c Sidelying scapular mobs  and gentle shoulder flexion, min-mod facilitation. Closed chain shoulder flexion with min facilitation. Wall slides x 10 reps min v.c UE ranger for shoulder flexion mid-high range and circumduction min facilitation/ v.c Closed chain shoulder abduction and internal external rotation, min v.c              OT Short Term Goals - 08/24/18 1009      OT SHORT TERM GOAL #1   Title  I with inital HEP.    Time  6    Period  Weeks    Status  Achieved    Target Date  07/11/18      OT SHORT TERM GOAL #2   Title  Pt will demonstrate  improved RUE functional use as evidenced by increasing box/ blocks score by 8 blocks.    Baseline  RUE 48, LUE 75    Time  6    Period  Weeks    Status  On-going      OT SHORT TERM GOAL #3   Title  Pt will demonstrate ability to retrieve a light weight object from overhead shelf at 120 shoulder  flexion with no more than min compensations.    Time  6    Period  Weeks    Status  On-going   07/13/18:  95*     OT SHORT TERM GOAL #4   Title  Pt will perfrom all basic ADLS modified independently.    Time  6    Period  Weeks    Status  Achieved   07/13/18     OT SHORT TERM GOAL #5   Title  Pt will increase RUE grip strength to 35 lbs of greater for increased functional use.    Time  6    Period  Weeks    Status  On-going   07/13/18:  2lbs     OT SHORT TERM GOAL #6   Title  Pt will report using his RUE to assist with ADLs at least 50% of the time or greater.    Time  12    Period  Weeks    Status  Achieved   07/13/18: met per pt report       OT Long Term Goals - 05/27/18 1326      OT LONG TERM GOAL #1   Title  Pt will demonstrate ability to retrieve a 3 lbs weight from overhead shelf demonstrating good control with RUE.    Time  12    Period  Weeks    Status  New    Target Date  08/25/18      OT LONG TERM GOAL #2   Title  Pt will perform mod complex home managment modified independently.    Time  12    Period  Weeks    Status  New       OT LONG TERM GOAL #3   Title  Pt will demonstrate improved RUE fine motor coordination for ADLS as evidenced by completing 9 hole peg test in 38 secs or less.    Time  12    Period  Weeks    Status  New      OT LONG TERM GOAL #4   Title  Pt will report that he has returned to using his RUE as dominant hand at least 75% of the time for ADLs/IADLs..    Time  12    Period  Weeks    Status  New      OT LONG TERM GOAL #5   Title  Pt will demonstrate ability to write his name and address legibily using RUE.    Time  12    Period  Weeks    Status  New      Long Term Additional Goals   Additional Long Term Goals  Yes      OT LONG TERM GOAL #6   Title  Pt will perform simulated work activities modified indpendently demonstrating good safety awareness.    Time  12    Period  Weeks    Status  New            Plan - 08/24/18 1007    Clinical Impression Statement  Pt is progressing towards goals. Pt's pain is better today for right shoulder.    OT Occupational Profile and History  Detailed Assessment- Review of Records and additional review of physical, cognitive, psychosocial history related to current functional performance    Occupational performance deficits (Please refer to evaluation for details):  ADL's;IADL's;Work;Leisure;Social Participation    Body Structure / Function / Physical Skills  ADL;Dexterity;Flexibility;ROM;Strength;IADL;FMC;Coordination;Balance;UE functional use;GMC;Gait;Endurance;Decreased knowledge of precautions;Sensation  Rehab Potential  Good    OT Frequency  2x / week    OT Duration  8 weeks    OT Treatment/Interventions  Self-care/ADL training;Cryotherapy;Paraffin;Therapeutic exercise;DME and/or AE instruction;Functional Mobility Training;Balance training;Splinting;Manual Therapy;Neuromuscular education;Fluidtherapy;Ultrasound;Aquatic Therapy;Electrical Stimulation;Moist Heat;Contrast Bath;Energy conservation;Passive range of motion;Therapeutic  activities;Patient/family education    Plan  pain reduction strategies,  continue NMR and scapula depression and hand function/ROM    Consulted and Agree with Plan of Care  Patient    Family Member Consulted  video interpreter though status       Patient will benefit from skilled therapeutic intervention in order to improve the following deficits and impairments:  Body Structure / Function / Physical Skills  Visit Diagnosis: Muscle weakness (generalized)  Hemiplegia and hemiparesis following cerebral infarction affecting right dominant side ([facility])  Other lack of coordination  Other abnormalities of gait and mobility  Abnormal posture    Problem List Patient Active Problem List   Diagnosis Date Noted  . 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 ([facility]) 08/02/2018  . Left pontine cerebrovascular accident ([place]) 05/11/2018  . Tobacco abuse   . Uncontrolled type 2 diabetes mellitus with hyperglycemia ([place])   . CVA (cerebral vascular accident) ([place]) 05/06/2018  . Hyperlipidemia 05/30/2017    Ahkeem Goede 08/24/2018, 4:26 PM  [facility] [address] [place], [place], [address] Phone: [phone]   Fax:  [phone]  Name: Arthur Howard MRN: [id number] Date of Birth: Nov 12, 1965

## 2018-08-25 ENCOUNTER — Ambulatory Visit (INDEPENDENT_AMBULATORY_CARE_PROVIDER_SITE_OTHER): Payer: Commercial Indemnity | Admitting: Internal Medicine

## 2018-08-25 DIAGNOSIS — I6300 Cerebral infarction due to thrombosis of unspecified precerebral artery: Secondary | ICD-10-CM | POA: Diagnosis not present

## 2018-08-25 DIAGNOSIS — E1165 Type 2 diabetes mellitus with hyperglycemia: Secondary | ICD-10-CM

## 2018-08-25 DIAGNOSIS — I6350 Cerebral infarction due to unspecified occlusion or stenosis of unspecified cerebral artery: Secondary | ICD-10-CM

## 2018-08-25 DIAGNOSIS — E7849 Other hyperlipidemia: Secondary | ICD-10-CM

## 2018-08-25 DIAGNOSIS — Z720 Tobacco use: Secondary | ICD-10-CM

## 2018-08-25 DIAGNOSIS — I639 Cerebral infarction, unspecified: Secondary | ICD-10-CM

## 2018-08-25 MED ORDER — GLIPIZIDE 10 MG PO TABS: 10.0000 mg | ORAL_TABLET | Freq: Two times a day (BID) | ORAL | 0 refills | Status: DC

## 2018-08-25 MED ORDER — ATORVASTATIN CALCIUM 80 MG PO TABS: 80.0000 mg | ORAL_TABLET | Freq: Every day | ORAL | 0 refills | Status: DC

## 2018-08-25 MED ORDER — METFORMIN HCL 500 MG PO TABS: 500.0000 mg | ORAL_TABLET | Freq: Two times a day (BID) | ORAL | 3 refills | Status: DC

## 2018-08-25 MED FILL — metFORMIN HCL 500 MG TABS: 500 | 90 days supply | Qty: 180 | Fill #0

## 2018-08-25 NOTE — Progress Notes (Signed)
Virtual Visit via Video Note  I connected with Arthur Howard on 08/25/18 at 11:00 AM EDT by a video enabled telemedicine application and verified that I am speaking with the correct person using two identifiers.  Location patient: home Location provider: work office Persons participating in the virtual visit: patient, provider  I discussed the limitations of evaluation and management by telemedicine and the availability of in person appointments. The patient expressed understanding and agreed to proceed.   HPI: This is a scheduled visit for follow-up of chronic medical conditions.  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  He is going routinely to outpatient therapy.  At time of his stroke he was diagnosed with hyperlipidemia as well as new onset type 2 diabetes that has been uncontrolled.  His most recent A1c was 11.3 in February 2020.  When he saw me I added metformin to his glipizide.  He tells me that his CBGs are in the 70-80 range in the morning and in the evenings are usually 130s to 140s, however he recently had a steroid injection to his right shoulder and after that his afternoon sugars have been 1 80-1 90 for the past week or so.  He also has a history of tobacco abuse, he has currently quit smoking.   ROS: Constitutional: Denies fever, chills, diaphoresis, appetite change and fatigue.  HEENT: Denies photophobia, eye pain, redness, hearing loss, ear pain, congestion, sore throat, rhinorrhea, sneezing, mouth sores, trouble swallowing, neck pain, neck stiffness and tinnitus.   Respiratory: Denies SOB, DOE, cough, chest tightness,  and wheezing.   Cardiovascular: Denies chest pain, palpitations and leg swelling.  Gastrointestinal: Denies nausea, vomiting, abdominal pain, diarrhea, constipation, blood in stool and abdominal distention.  Genitourinary: Denies dysuria, urgency, frequency, hematuria, flank pain and difficulty  urinating.  Endocrine: Denies: hot or cold intolerance, sweats, changes in hair or nails, polyuria, polydipsia. Musculoskeletal: Denies myalgias, back pain, joint swelling, arthralgias and gait problem.  Skin: Denies pallor, rash and wound.  Neurological: Denies dizziness, seizures, syncope, light-headedness, numbness and headaches.  Hematological: Denies adenopathy. Easy bruising, personal or family bleeding history  Psychiatric/Behavioral: Denies suicidal ideation, mood changes, confusion, nervousness, sleep disturbance and agitation   Past Medical History:  Diagnosis Date   Diabetes mellitus without complication ([place])    Hyperlipidemia     Past Surgical History:  Procedure Laterality Date   ANKLE SURGERY      Family History  Problem Relation Age of Onset   Heart disease Mother    Lung cancer Father    Diabetes Sister    Diabetes Brother    CVA Neg Hx     SOCIAL HX:   reports that he has quit smoking. He has never used smokeless tobacco. He reports that he does not drink alcohol or use drugs.   Current Outpatient Medications:    acetaminophen (TYLENOL) 325 MG tablet, Take 2 tablets (650 mg total) by mouth every 4 (four) hours as needed for mild pain (or temp > 37.5 C (99.5 F))., Disp: , Rfl:    aspirin EC 81 MG EC tablet, Take 1 tablet (81 mg total) by mouth daily., Disp: , Rfl:    atorvastatin (LIPITOR) 80 MG tablet, Take 1 tablet (80 mg total) by mouth daily., Disp: 90 tablet, Rfl: 0   blood glucose meter kit and supplies KIT, Dispense based on patient and insurance preference. Use up to four times daily as directed. (FOR ICD-9 250.00, 250.01).,  Disp: 1 each, Rfl: 0   diclofenac sodium (VOLTAREN) 1 % GEL, Apply 2 g topically 4 (four) times daily., Disp: 1 Tube, Rfl: 3   gabapentin (NEURONTIN) 300 MG capsule, Take 1 capsule (300 mg total) by mouth 2 (two) times daily., Disp: 60 capsule, Rfl: 1   glipiZIDE (GLUCOTROL) 10 MG tablet, Take 1 tablet (10 mg total)  by mouth 2 (two) times daily before a meal., Disp: 180 tablet, Rfl: 0   metFORMIN (GLUCOPHAGE) 500 MG tablet, Take 1 tablet (500 mg total) by mouth 2 (two) times daily with a meal., Disp: 180 tablet, Rfl: 3   traMADol (ULTRAM) 50 MG tablet, Take 1 tablet (50 mg total) by mouth at bedtime., Disp: 20 tablet, Rfl: 0  EXAM:   VITALS per patient if applicable: none reported  GENERAL: alert, oriented, appears well and in no acute distress  HEENT: atraumatic, conjunttiva clear, no obvious abnormalities on inspection of external nose and ears  NECK: normal movements of the head and neck  LUNGS: on inspection no signs of respiratory distress, breathing rate appears normal, no obvious gross increased work of breathing, gasping or wheezing  CV: no obvious cyanosis  MS: moves all visible extremities without noticeable abnormality  PSYCH/NEURO: pleasant and cooperative, no obvious depression or anxiety, speech and thought processing grossly intact  ASSESSMENT AND PLAN:   Other hyperlipidemia  -Refill atorvastatin, check lipids at next visit.  Uncontrolled type 2 diabetes mellitus with hyperglycemia (HCC) -CBGs seems improved with addition of metformin, he is due for an A1c which we will obtain at next in office visit.  Cerebrovascular accident (CVA) due to thrombosis of precerebral artery ([place]) -With residual right-sided hemiparesis, follows routinely with PMR and with physical therapy.  Tobacco abuse -He has quit smoking.      I discussed the assessment and treatment plan with the patient. The patient was provided an opportunity to ask questions and all were answered. The patient agreed with the plan and demonstrated an understanding of the instructions.   The patient was advised to call back or seek an in-person evaluation if the symptoms worsen or if the condition fails to improve as anticipated.    Lelon Frohlich, MD  [facility]

## 2018-08-26 ENCOUNTER — Ambulatory Visit: Payer: Commercial Indemnity | Admitting: Internal Medicine

## 2018-08-27 ENCOUNTER — Encounter: Payer: Self-pay | Admitting: Physical Therapy

## 2018-08-27 ENCOUNTER — Ambulatory Visit: Payer: Commercial Indemnity | Admitting: Occupational Therapy

## 2018-08-27 ENCOUNTER — Other Ambulatory Visit: Payer: Self-pay

## 2018-08-27 ENCOUNTER — Ambulatory Visit: Payer: Commercial Indemnity | Admitting: Physical Therapy

## 2018-08-27 DIAGNOSIS — R2681 Unsteadiness on feet: Secondary | ICD-10-CM

## 2018-08-27 DIAGNOSIS — I69351 Hemiplegia and hemiparesis following cerebral infarction affecting right dominant side: Secondary | ICD-10-CM

## 2018-08-27 DIAGNOSIS — R278 Other lack of coordination: Secondary | ICD-10-CM

## 2018-08-27 DIAGNOSIS — M6281 Muscle weakness (generalized): Secondary | ICD-10-CM

## 2018-08-27 DIAGNOSIS — R2689 Other abnormalities of gait and mobility: Secondary | ICD-10-CM

## 2018-08-27 DIAGNOSIS — R293 Abnormal posture: Secondary | ICD-10-CM

## 2018-08-27 NOTE — Therapy (Signed)
[facility] [address] [place], [place], [address] Phone: [phone]   Fax:  [phone]  Occupational Therapy Treatment  Patient Details  Name: Arthur Howard MRN: [id number] Date of Birth: [birth year] No data recorded  Encounter Date: 08/27/2018  OT End of Session - 08/27/18 1420    Visit Number  24    Number of Visits  33    Date for OT Re-Evaluation  08/25/18    Authorization Type  [employer]    Authorization Time Period  12 weeks, 60 visit limit, counts as 1 visit if PT/OTsame day    Authorization - Visit Number  23    Authorization - Number of Visits  47    OT Start Time  1350    OT Stop Time  7741    OT Time Calculation (min)  52 min       Past Medical History:  Diagnosis Date  . Diabetes mellitus without complication ([place])   . Hyperlipidemia     Past Surgical History:  Procedure Laterality Date  . ANKLE SURGERY      There were no vitals filed for this visit.  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  Yes    Pain Score  3     Pain Location  Shoulder   hand   Pain Orientation  Right    Pain Descriptors / Indicators  Aching    Pain Type  Chronic pain    Pain Onset  More than a month ago    Pain Frequency  Intermittent    Aggravating Factors   use     Pain Relieving Factors  heat               CLINIC OPERATION CHANGES: Outpatient Neuro Rehab is open at lower capacity following universal masking, social distancing, and patient screening.  The patient's COVID risk of complications score is 3 fluidotherapy x 10 mins with hotpack on right shoulder for pain and stiffness. A/ROM finger flexion/  extension and wrist passive stretch. Scrubbing tabletops with RUE and towel min v.c Sidelying scapular mobs and gentle shoulder flexion, min-mod facilitation. Closed chain shoulder flexion with min facilitation, mid range for correct positioning Wall slides x 10 reps min v.c, gentle wall pushups as tolerated UE ranger for shoulder flexion mid-high range and circumduction, abduction and external rotation min facilitation/ v.c amb while carrying a 3 lbs weight in left hand,  Gripper set at level 1 for sustained grip, picking up blocks, mod difficulty/ drops Wrist flexion/ extension with 1 lbs weight, min v.c for positioning. Graded clothes pins for sustined pinch all colors except black, min difficulty/ v.c for positioning.              OT Short Term Goals - 08/24/18 1009      OT SHORT TERM GOAL #1   Title  I with inital HEP.    Time  6    Period  Weeks    Status  Achieved    Target Date  07/11/18      OT SHORT TERM GOAL #2   Title  Pt will demonstrate  improved RUE functional use as  evidenced by increasing box/ blocks score by 8 blocks.    Baseline  RUE 48, LUE 75    Time  6    Period  Weeks    Status  On-going      OT SHORT TERM GOAL #3   Title  Pt will demonstrate ability to retrieve a light weight object from overhead shelf at 120 shoulder flexion with no more than min compensations.    Time  6    Period  Weeks    Status  On-going   07/13/18:  95*     OT SHORT TERM GOAL #4   Title  Pt will perfrom all basic ADLS modified independently.    Time  6    Period  Weeks    Status  Achieved   07/13/18     OT SHORT TERM GOAL #5   Title  Pt will increase RUE grip strength to 35 lbs of greater for increased functional use.    Time  6    Period  Weeks    Status  On-going   07/13/18:  2lbs     OT SHORT TERM GOAL #6   Title  Pt will report using his RUE to assist with ADLs at least 50% of the time or greater.    Time  12    Period  Weeks    Status  Achieved    07/13/18: met per pt report       OT Long Term Goals - 05/27/18 1326      OT LONG TERM GOAL #1   Title  Pt will demonstrate ability to retrieve a 3 lbs weight from overhead shelf demonstrating good control with RUE.    Time  12    Period  Weeks    Status  New    Target Date  08/25/18      OT LONG TERM GOAL #2   Title  Pt will perform mod complex home managment modified independently.    Time  12    Period  Weeks    Status  New      OT LONG TERM GOAL #3   Title  Pt will demonstrate improved RUE fine motor coordination for ADLS as evidenced by completing 9 hole peg test in 38 secs or less.    Time  12    Period  Weeks    Status  New      OT LONG TERM GOAL #4   Title  Pt will report that he has returned to using his RUE as dominant hand at least 75% of the time for ADLs/IADLs..    Time  12    Period  Weeks    Status  New      OT LONG TERM GOAL #5   Title  Pt will demonstrate ability to write his name and address legibily using RUE.    Time  12    Period  Weeks    Status  New      Long Term Additional Goals   Additional Long Term Goals  Yes      OT LONG TERM GOAL #6   Title  Pt will perform simulated work activities modified indpendently demonstrating good safety awareness.    Time  12    Period  Weeks    Status  New            Plan - 08/27/18 1436    Clinical Impression Statement  Pt is progressing towards goals He  demonstrates improved ROM and LUE functional use as well as deccreased pain.    Occupational performance deficits (Please refer to evaluation for details):  ADL's;IADL's;Work;Leisure;Social Participation    Body Structure / Function / Physical Skills  ADL;Dexterity;Flexibility;ROM;Strength;IADL;FMC;Coordination;Balance;UE functional use;GMC;Gait;Endurance;Decreased knowledge of precautions;Sensation    OT Frequency  2x / week    OT Duration  8 weeks    OT Treatment/Interventions  Self-care/ADL training;Cryotherapy;Paraffin;Therapeutic exercise;DME  and/or AE instruction;Functional Mobility Training;Balance training;Splinting;Manual Therapy;Neuromuscular education;Fluidtherapy;Ultrasound;Aquatic Therapy;Electrical Stimulation;Moist Heat;Contrast Bath;Energy conservation;Passive range of motion;Therapeutic activities;Patient/family education    Plan   continue NMR and scapula depression and hand function/ROM    Consulted and Agree with Plan of Care  Patient    Family Member Consulted  video interpreter though status       Patient will benefit from skilled therapeutic intervention in order to improve the following deficits and impairments:  Body Structure / Function / Physical Skills  Visit Diagnosis: Muscle weakness (generalized)  Abnormal posture  Hemiplegia and hemiparesis following cerebral infarction affecting right dominant side (HCC)  Other lack of coordination  Other abnormalities of gait and mobility    Problem List Patient Active Problem List   Diagnosis Date Noted  . 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 Left pontine cerebrovascular accident ([place]) 05/11/2018  . Tobacco abuse   . Uncontrolled type 2 diabetes mellitus with hyperglycemia ([place])   . CVA (cerebral vascular accident) ([place]) 05/06/2018  . Hyperlipidemia 05/30/2017    Derrek Puff 08/27/2018, 3:32 PM  [facility] [address] [place], [place], [address] Phone: [phone]   Fax:  [phone]  Name: Arthur Howard MRN: [id number] Date of Birth: 08-26-1965

## 2018-08-27 NOTE — Therapy (Signed)
[facility] [address] [place], [place], [address] Phone: [phone]   Fax:  [phone]  Physical Therapy Treatment  Patient Details  Name: MATHIEU SCHLOEMER MRN: [id number] Date of Birth: [birth year] Referring Provider (PT): Bretta Bang referred from hospital; f/u is with Venancio Poisson, NP   Encounter Date: 08/27/2018  CLINIC OPERATION CHANGES: Outpatient Neuro Rehab is open at lower capacity following universal masking, social distancing, and patient screening.  The patient's COVID risk of complications score is 3.   PT End of Session - 08/27/18 1359    Visit Number  25    Number of Visits  30   per recert   Date for PT Re-Evaluation  09/25/18   per 4 week recert   Authorization Type  [employer]-60 visits combined PT and OT if on same day    Authorization - Visit Number  25    Authorization - Number of Visits  60    PT Start Time  5974    PT Stop Time  1638    PT Time Calculation (min)  46 min    Activity Tolerance  Patient tolerated treatment well;No increased pain    Behavior During Therapy  WFL for tasks assessed/performed       Past Medical History:  Diagnosis Date  . Diabetes mellitus without complication ([place])   . Hyperlipidemia     Past Surgical History:  Procedure Laterality Date  . ANKLE SURGERY      There were no vitals filed for this visit.  Subjective Assessment - 08/27/18 1256    Subjective  Right now, the pain in my arm is 1/10.  Been doing my exercises.  Only thing is my R foot gets tired after about 50 min-1 hr.    Patient is accompained by:  Family member    Patient Stated Goals  Pt's goal is to improve R hand, arm, leg strength.    Currently in Pain?  Yes    Pain Score  3     Pain Location  Hand   arm   Pain Orientation  Right    Pain Descriptors / Indicators  Aching    Pain Type  Chronic pain    Pain Onset  More than a month ago    Pain Frequency  Constant     Aggravating Factors   use     Pain Relieving Factors  repositioning                 High level balance exercises:   -Braiding, Sidestep squats, tandem gait, tandem march, toe walking, jogging, all performed 2 reps x 20 ft. -Forward marching-cues for upright posture and looking ahead  Attempted to review posture exercises given on 08/11/2018 (see HEP), but pt has difficulty and some pain in R shoulder-requested pt hold on these exercises due to pain (OT may be addressing shoulder postural exercises)-unsure if patient is performing these at home -Seated anterior/posterior pelvic tilts, with cues for scapular retraction in sitting, chin tucks in sitting x 5 reps      [facility] Adult PT Treatment/Exercise - 08/27/18 0001      High Level Balance   High Level Balance Comments  Floor ladder negotiation:  forward, march/lift, hop-step, then step out/out, in/in with increased continuous pace/speed, no LOB (several instances needed to reset on out/out in/in sequence)      Therapeutic Activites    Therapeutic Activities  Other Therapeutic Activities    Other Therapeutic Activities  A-frame Ladder negotiation, x 3 reps, 3-4 steps:  pt ascends with step-to pattern x 1, then ascending with step through pattern, min guard x 2, descends in reverse, one step at a time           Balance Exercises - 08/27/18 1352      Balance Exercises: Standing   Step Ups  Forward;6 inch;Intermittent UE support   8" step, 3 sec hold   Other Standing Exercises  Forward lunges 10 reps each leg          PT Short Term Goals - 08/19/18 1453      PT SHORT TERM GOAL #1   Title  = LTG        PT Long Term Goals - 08/19/18 1453      PT LONG TERM GOAL #1   Title  The patient will be independent with progression of HEP for discharge.    Baseline  08/18/18: met with current HEP    Time  4    Period  Weeks    Status  Revised    Target Date  09/25/18      PT LONG TERM GOAL #2   Title  The patient will  demonstrate return to climbing an A frame ladder with use of RUE and independent ability to balance, step up with R LE for return to household and work activities.    Time  4    Period  Weeks    Status  Revised    Target Date  09/25/18      PT LONG TERM GOAL #3   Title  Pt will demonstrate improvement in overall function as indicated by FOTO score of 77    Baseline  67% functional intake    Time  4    Period  Weeks    Status  New    Target Date  09/25/18      PT LONG TERM GOAL #4   Title  The patient will be able to return to yard work activities (discusses wanting to mow the yard-- fairly level surface, however this may depend more on R shoulder pain than balance/gait).      Baseline  08/18/18: has started with mowing, however needs help to start mower and only able push with left UE. Only mowed for 10-15 minute increments so not to fatigue.     Time  4    Period  Weeks    Status  Revised    Target Date  09/25/18      PT LONG TERM GOAL #5   Title  The patient will be able to walk while carrying items (grocery bags, household items, etc).      Baseline  08/18/18: has been some, ~20-30% return to pre stroke activity    Time  4    Period  Weeks    Status  Revised    Target Date  09/25/18            Plan - 08/27/18 1359    Clinical Impression Statement  Focused session today on review of high level balance HEP and activities for coordination, SLS (including step ups, floor ladder negotiation, and A-frame ladder negoitation with alternating step pattern).  Pt reports taking groceries in at home, no LOB, using mostly L hand.  He is well on target to meet functional mobility goals, but is still limited at times by pain, posture (OT is address R hand pain).  Plan to hold PT next week and  plan to check mobility goals, then dry needling as needed.    Personal Factors and Comorbidities  Comorbidity 2;Past/Current Experience    Comorbidities  hyperlipidemia, DM    Examination-Activity  Limitations  Carry;Stairs;Locomotion Level;Transfers    Examination-Participation Restrictions  Community Activity;Yard Work;Other    Stability/Clinical Decision Making  Evolving/Moderate complexity    Rehab Potential  Good    PT Frequency  2x / week    PT Duration  8 weeks   anticipate D/C in 4   PT Treatment/Interventions  ADLs/Self Care Home Management;Electrical Stimulation;DME Instruction;Gait training;Stair training;Functional mobility training;Therapeutic activities;Therapeutic exercise;Orthotic Fit/Training;Patient/family education;Neuromuscular re-education;Balance training;Manual techniques;Spinal Manipulations;Aquatic Therapy;Cryotherapy;Moist Heat;Passive range of motion;Dry needling    PT Next Visit Plan  Plan to take out PT appt week of 08/30/2018, then check functional mobility goals; plan to see Audra for dry needling on 09/10/2018 to further assess for shoulder pain/postural exercises    PT Home Exercise Plan  Access Code: [email or web address] , shoulder stretches: [email or web address]    Consulted and Agree with Plan of Care  Patient    Family Member Consulted  Daughter       Patient will benefit from skilled therapeutic intervention in order to improve the following deficits and impairments:  Abnormal gait, Decreased balance, Decreased mobility, Difficulty walking, Decreased strength, Impaired flexibility, Impaired tone, Postural dysfunction, Pain, Increased muscle spasms, Decreased range of motion  Visit Diagnosis: Unsteadiness on feet  Other abnormalities of gait and mobility  Abnormal posture     Problem List Patient Active Problem List   Diagnosis Date Noted  . 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 ([place]) 08/02/2018  . Left pontine cerebrovascular accident ([place]) 05/11/2018  . Tobacco abuse   . Uncontrolled type 2 diabetes mellitus with hyperglycemia ([place])   . CVA (cerebral vascular accident) ([place]) 05/06/2018  . Hyperlipidemia 05/30/2017     Gertrue Willette W. 08/27/2018, 2:06 PM  Mady Haagensen, PT 08/27/18 2:10 PM Phone: [phone] Fax: [facility] [address] [place], [place], [address] Phone: [phone]   Fax:  [phone]  Name: JAHMEER PORCHE MRN: [id number] Date of Birth: 15-Aug-1965

## 2018-08-31 ENCOUNTER — Ambulatory Visit: Payer: Commercial Indemnity | Admitting: Occupational Therapy

## 2018-08-31 ENCOUNTER — Other Ambulatory Visit: Payer: Self-pay

## 2018-08-31 ENCOUNTER — Ambulatory Visit: Payer: Commercial Indemnity | Admitting: Physical Therapy

## 2018-08-31 DIAGNOSIS — R293 Abnormal posture: Secondary | ICD-10-CM

## 2018-08-31 DIAGNOSIS — M25511 Pain in right shoulder: Secondary | ICD-10-CM

## 2018-08-31 DIAGNOSIS — R278 Other lack of coordination: Secondary | ICD-10-CM

## 2018-08-31 DIAGNOSIS — M6281 Muscle weakness (generalized): Secondary | ICD-10-CM | POA: Diagnosis not present

## 2018-08-31 DIAGNOSIS — M79641 Pain in right hand: Secondary | ICD-10-CM

## 2018-08-31 DIAGNOSIS — I69351 Hemiplegia and hemiparesis following cerebral infarction affecting right dominant side: Secondary | ICD-10-CM

## 2018-08-31 NOTE — Therapy (Signed)
[facility] [address] [place], [place], [address] Phone: [phone]   Fax:  [phone]  Occupational Therapy Treatment  Patient Details  Name: Arthur Howard MRN: [id number] Date of Birth: [birth year] No data recorded  Encounter Date: 08/31/2018  OT End of Session - 08/31/18 1102    Visit Number  25    Number of Visits  33    Authorization Type  [employer]    Authorization Time Period  12 weeks, 60 visit limit, counts as 1 visit if PT/OTsame day    Authorization - Visit Number  24    Authorization - Number of Visits  60       Past Medical History:  Diagnosis Date  . Diabetes mellitus without complication ([place])   . Hyperlipidemia     Past Surgical History:  Procedure Laterality Date  . ANKLE SURGERY      There were no vitals filed for this visit.  Subjective Assessment - 08/31/18 1108    Currently in Pain?  Yes    Pain Score  4     Pain Location  Shoulder   hand   Pain Orientation  Right    Pain Descriptors / Indicators  Aching    Pain Type  Chronic pain    Pain Onset  More than a month ago    Pain Frequency  Intermittent    Aggravating Factors   use    Pain Relieving Factors  heat    Multiple Pain Sites  No           CLINIC OPERATION CHANGES: Outpatient Neuro Rehab is open at lower capacity following universal masking, social distancing, and patient screening.  The patient's COVID risk of complications score is 3 fluidotherapy x 22mns with hotpack on right shoulder for pain and stiffness. A/ROM finger flexion/ extension and wrist passive stretch. Scrubbing tabletops with RUE and towel min v.c Supine Closed chain shoulder flexion with min facilitation, mid range for correct positioning, AA/ROM shoulder abduction performed, pt experienced a cramp/ sharp pain so activity was discontinued. Wall slides x 10 reps min v.c,  UE ranger for shoulder flexion mid-high range and circumduction, abduction and  external rotation min facilitation/ v.c Functional reach into overhead cabinet with RUE, pt demonstrates mod compensations Therapist checked progress towards goals. .                         OT Short Term Goals - 08/31/18 1117      OT SHORT TERM GOAL #1   Title  I with inital HEP.    Time  6    Period  Weeks    Status  Achieved    Target Date  07/11/18      OT SHORT TERM GOAL #2   Title  Pt will demonstrate  improved RUE functional use as evidenced by increasing box/ blocks score by 8 blocks.    Baseline  RUE 48, LUE 75    Time  6    Period  Weeks    Status  Achieved   64 blocks     OT SHORT TERM GOAL #3   Title  Pt will demonstrate ability to retrieve a light weight object from overhead shelf at 120 shoulder flexion with no more than min compensations.    Time  6    Period  Weeks    Status  On-going   07/13/18:  95*     OT SHORT  TERM GOAL #4   Title  Pt will perfrom all basic ADLS modified independently.    Time  6    Period  Weeks    Status  Achieved   07/13/18     OT SHORT TERM GOAL #5   Title  Pt will increase RUE grip strength to 35 lbs of greater for increased functional use.    Time  6    Period  Weeks    Status  On-going  (Pended)    16 lbs, 20 lbs     OT SHORT TERM GOAL #6   Title  Pt will report using his RUE to assist with ADLs at least 50% of the time or greater.    Time  12    Period  Weeks    Status  Achieved   07/13/18: met per pt report       OT Long Term Goals - 08/31/18 1103      OT LONG TERM GOAL #1   Title  Pt will demonstrate ability to retrieve a 3 lbs weight from overhead shelf demonstrating good control with RUE.    Status  On-going      OT LONG TERM GOAL #2   Title  Pt will perform mod complex home managment modified independently.    Status  On-going      OT LONG TERM GOAL #3   Title  Pt will demonstrate improved RUE fine motor coordination for ADLS as evidenced by completing 9 hole peg test in 38 secs or  less.    Status  On-going      OT LONG TERM GOAL #4   Title  Pt will report that he has returned to using his RUE as dominant hand at least 75% of the time for ADLs/IADLs..    Status  On-going      OT LONG TERM GOAL #5   Title  Pt will demonstrate ability to write his name and address legibily using RUE.    Status  On-going      OT LONG TERM GOAL #6   Title  Pt will perform simulated work activities modified indpendently demonstrating good safety awareness.    Status  On-going            Plan - 08/31/18 1109    Clinical Impression Statement  Pt demonstrates good overall progress. He reports decreased hand and shoulder pain and demonstrates improved ability to use functionally.    Occupational performance deficits (Please refer to evaluation for details):  ADL's;IADL's;Work;Leisure;Social Participation    Body Structure / Function / Physical Skills  ADL;Dexterity;Flexibility;ROM;Strength;IADL;FMC;Coordination;Balance;UE functional use;GMC;Gait;Endurance;Decreased knowledge of precautions;Sensation    Rehab Potential  Good       Patient will benefit from skilled therapeutic intervention in order to improve the following deficits and impairments:  Body Structure / Function / Physical Skills  Visit Diagnosis: Abnormal posture  Muscle weakness (generalized)  Hemiplegia and hemiparesis following cerebral infarction affecting right dominant side ([place])  Other lack of coordination    Problem List Patient Active Problem List   Diagnosis Date Noted  . 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 Left pontine cerebrovascular accident ([place]) 05/11/2018  . Tobacco abuse   . Uncontrolled type 2 diabetes mellitus with hyperglycemia ([place])   . CVA (cerebral vascular accident) ([place]) 05/06/2018  . Hyperlipidemia 05/30/2017    Brigett Estell 08/31/2018, 12:33 PM  [facility] [address] [place],  [place], [address] Phone: [phone]   Fax:  [phone]  Name: Arthur Howard MRN: [id number] Date of Birth: 21-Jan-1966

## 2018-09-03 ENCOUNTER — Other Ambulatory Visit: Payer: Self-pay | Admitting: Physical Medicine & Rehabilitation

## 2018-09-03 ENCOUNTER — Telehealth: Payer: Self-pay | Admitting: *Deleted

## 2018-09-03 MED ORDER — TRAMADOL HCL 50 MG PO TABS: 50.0000 mg | ORAL_TABLET | Freq: Every day | ORAL | 0 refills | Status: DC

## 2018-09-03 MED FILL — traMADol HCL 50 MG TABS: 50 | 20 days supply | Qty: 20 | Fill #0

## 2018-09-03 NOTE — Telephone Encounter (Signed)
Arthur Howard daughter called twice and is asking if Dr Letta Pate can prescribe more Tramadol for her father as his pain is worse and he is not able to sleep.  Please advise

## 2018-09-03 NOTE — Telephone Encounter (Signed)
Notified and I told his daughter he will need to come in for follow up if he needs the medication again.

## 2018-09-07 ENCOUNTER — Ambulatory Visit: Payer: Commercial Indemnity | Admitting: Physical Therapy

## 2018-09-07 ENCOUNTER — Other Ambulatory Visit: Payer: Self-pay

## 2018-09-07 ENCOUNTER — Ambulatory Visit: Payer: Commercial Indemnity | Admitting: Occupational Therapy

## 2018-09-07 DIAGNOSIS — M79641 Pain in right hand: Secondary | ICD-10-CM

## 2018-09-07 DIAGNOSIS — M6281 Muscle weakness (generalized): Secondary | ICD-10-CM | POA: Diagnosis not present

## 2018-09-07 DIAGNOSIS — M25511 Pain in right shoulder: Secondary | ICD-10-CM

## 2018-09-07 DIAGNOSIS — R278 Other lack of coordination: Secondary | ICD-10-CM

## 2018-09-07 DIAGNOSIS — I69351 Hemiplegia and hemiparesis following cerebral infarction affecting right dominant side: Secondary | ICD-10-CM

## 2018-09-07 NOTE — Patient Instructions (Signed)
Dorsiflexion: Resisted    De frente al punto de agarre de una banda elstica, con el otro extremo alrededor del pie izquierdo, lleve la punta del pie hacia usted. Repita _10-15___ veces por rutina. Realice __2__ Albertina Sen[place] por sesin. Realice _0-1___ sesiones por da.  IN SEATED POSITION - HOLD BAND DOWN WITH LEFT FOOTStrengthening: Hip Flexion - Resisted    Con una banda elstica alrededor del tobillo izquierdo, de espaldas al punto de agarre de la banda. Lleve la pierna hacia adelante manteniendo la rodilla extendida. Repita _10-15___ veces por rutina. Realice __0__ Albertina [place] por sesin. Realice _0-7___ sesiones por da.  http://orth.exer.us/638   Copyright  VHI. All rights reserved.    Copyright  VHI. All rights reserved. http://orth.exer.us/688Strengthening: Hip Abduction - Resisted    Con una banda elstica alrededor de la pierna derecha, pierna opuesta hacia el punto de agarre de la banda, lleve la pierna hacia afuera del cuerpo. Repita _10-15___ veces por rutina. Realice __1__ Albertina [place] por sesin. Realice __2-1FXJOITGPQDIYM: Hip Extension - Resisted    Con una banda elstica alrededor del tobillo derecho, de frente al punto de agarre de la banda, lleve la pierna extendida [place] atrs. Repita _10-15___ veces por rutina. Realice 2____ rutinas por sesin. Realice __4-1__ sesiones por da.

## 2018-09-07 NOTE — Therapy (Signed)
[facility] [address] [place], [place], [address] Phone: [phone]   Fax:  [phone]  Occupational Therapy Treatment  Patient Details  Name: Arthur Howard MRN: [id number] Date of Birth: Jul 26, 1965 No data recorded  Encounter Date: 09/07/2018  OT End of Session - 09/07/18 1302    Visit Number  26    Number of Visits  33    Authorization Type  [employer]ign[employer]    Authorization Time Period  12 weeks, 60 visit limit, counts as 1 visit if PT/OTsame day    Authorization - Visit Number  26    Authorization - Number of Visits  75    OT Start Time  8563    OT Stop Time  1345    OT Time Calculation (min)  50 min    Activity Tolerance  Patient limited by pain    Behavior During Therapy  [facility] for tasks assessed/performed       Past Medical History:  Diagnosis Date  . Diabetes mellitus without complication ([place])   . Hyperlipidemia     Past Surgical History:  Procedure Laterality Date  . ANKLE SURGERY      There were no vitals filed for this visit.  Subjective Assessment - 09/07/18 1303    Subjective   Pt reports continued shoulder and hand pain    Limitations  non English speaking- MD gave pt shoulder injection and recommended limiting shoulder flexion to approx 90* fo 1 week, pt was diagnosed with frzen shoulder and shaoulder hand syndrome by Dr. Letta Pate,    Currently in Pain?  Yes    Pain Score  4     Pain Location  Shoulder   hand   Pain Orientation  Right    Pain Descriptors / Indicators  Aching    Pain Type  Acute pain    Pain Onset  More than a month ago    Pain Frequency  Intermittent    Aggravating Factors   malpositioning,    Pain Relieving Factors  heat    Effect of Pain on Daily Activities  limits daily activities             CLINIC OPERATION CHANGES: Outpatient Neuro Rehab is open at lower capacity following universal masking, social distancing, and patient screening.  The patient's COVID  risk of complications score is 3 fluidotherapy x 31mns with hotpack on right shoulder for pain and stiffness. A/ROM finger flexion/ extension and wrist passive stretch. [place]1 mhz, 0.8W/cm2, 20% x 8 mins to right upper arm, for pain and stiffness, no adverse reactions. Scrubbing tabletops with RUE and towel min v.c Sidelying scapular mobilization, and gentle shoulder flexion AA/ROM Shoulder extension with cane, followed by shoulder abduction, closed chain shoulder flexion, min facilitation/ v.c Wall slides, wall push ups x 10 reps min v.c,  UE ranger for shoulder flexion mid-high range and circumduction, abduction min facilitation/ v.c Pain reduced to 1/10 end of session.  .                          OT Short Term Goals - 08/31/18 1117      OT SHORT TERM GOAL #1   Title  I with inital HEP.    Time  6    Period  Weeks    Status  Achieved    Target Date  07/11/18      OT SHORT TERM GOAL #2   Title  Pt will  demonstrate  improved RUE functional use as evidenced by increasing box/ blocks score by 8 blocks.    Baseline  RUE 48, LUE 75    Time  6    Period  Weeks    Status  Achieved   64 blocks     OT SHORT TERM GOAL #3   Title  Pt will demonstrate ability to retrieve a light weight object from overhead shelf at 120 shoulder flexion with no more than min compensations.    Time  6    Period  Weeks    Status  On-going   mod compensations and intermittant discomfort     OT SHORT TERM GOAL #4   Title  Pt will perfrom all basic ADLS modified independently.    Time  6    Period  Weeks    Status  Achieved   07/13/18     OT SHORT TERM GOAL #5   Title  Pt will increase RUE grip strength to 35 lbs of greater for increased functional use.    Time  6    Period  Weeks    Status  On-going   16 lbs, 20 lbs     OT SHORT TERM GOAL #6   Title  Pt will report using his RUE to assist with ADLs at least 50% of the time or greater.    Time  12    Period  Weeks    Status   Achieved   07/13/18: met per pt report       OT Long Term Goals - 08/31/18 1103      OT LONG TERM GOAL #1   Title  Pt will demonstrate ability to retrieve a 3 lbs weight from overhead shelf demonstrating good control with RUE.    Status  On-going   unable due to decreased RUE contol     OT LONG TERM GOAL #2   Title  Pt will perform mod complex home managment modified independently.    Status  On-going   Pt has perfromed some home management however he is not back to prior level of function     OT LONG TERM GOAL #3   Title  Pt will demonstrate improved RUE fine motor coordination for ADLS as evidenced by completing 9 hole peg test in 38 secs or less.    Time  12    Period  Weeks    Status  On-going      OT LONG TERM GOAL #4   Title  Pt will report that he has returned to using his RUE as dominant hand at least 75% of the time for ADLs/IADLs..    Status  On-going   uses grossly 505 as non dominant assist     OT LONG TERM GOAL #5   Title  Pt will demonstrate ability to write his name and address legibily using RUE.    Status  On-going      OT LONG TERM GOAL #6   Title  Pt will perform simulated work activities modified indpendently demonstrating good safety awareness.    Status  On-going              Patient will benefit from skilled therapeutic intervention in order to improve the following deficits and impairments:           Visit Diagnosis: 1. Muscle weakness (generalized)   2. Hemiplegia and hemiparesis following cerebral infarction affecting right dominant side ([facility])   3. Other lack of coordination  4. Acute pain of right shoulder   5. Pain in right hand       Problem List Patient Active Problem List   Diagnosis Date Noted  . 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 ([place]) 08/02/2018  . Left pontine cerebrovascular accident ([place]) 05/11/2018  . Tobacco abuse   . Uncontrolled type 2 diabetes mellitus with hyperglycemia  ([place])   . CVA (cerebral vascular accident) ([place]) 05/06/2018  . Hyperlipidemia 05/30/2017    Kaylene Dawn 09/07/2018, 1:05 PM  [facility] [address] [place], [place], [address] Phone: [phone]   Fax:  [phone]  Name: Arthur Howard MRN: [id number] Date of Birth: [birth year]

## 2018-09-09 ENCOUNTER — Encounter: Payer: Self-pay | Admitting: Physical Therapy

## 2018-09-09 NOTE — Therapy (Signed)
[facility] [address] [place], [place], [address] Phone: [phone]   Fax:  [phone]  Physical Therapy Treatment  Patient Details  Name: Arthur Howard MRN: [id number] Date of Birth: [birth year] Referring Provider (PT): Bretta Bang referred from hospital; f/u is with Venancio Poisson, NP   Encounter Date: 09/07/2018  CLINIC OPERATION CHANGES: Outpatient Neuro Rehab is open at lower capacity following universal masking, social distancing, and patient screening.  The patient's COVID risk of complications score is 3.   PT End of Session - 09/09/18 0701    Visit Number  26    Number of Visits  30   per recert   Date for PT Re-Evaluation  09/25/18   per 4 week recert   Authorization Type  Cigna-60 visits combined PT and OT if on same day    Authorization - Visit Number  26    Authorization - Number of Visits  60    PT Start Time  1400    PT Stop Time  1444    PT Time Calculation (min)  44 min    Activity Tolerance  Patient tolerated treatment well;No increased pain    Behavior During Therapy  WFL for tasks assessed/performed       Past Medical History:  Diagnosis Date  . Diabetes mellitus without complication ([place])   . Hyperlipidemia     Past Surgical History:  Procedure Laterality Date  . ANKLE SURGERY      There were no vitals filed for this visit.  Subjective Assessment - 09/09/18 0659    Subjective  No changes since last visit.  No falls, no stumbles    Patient is accompained by:  Family member    Patient Stated Goals  Pt's goal is to improve R hand, arm, leg strength.    Currently in Pain?  Yes    Pain Score  1     Pain Location  Shoulder    Pain Orientation  Right    Pain Descriptors / Indicators  Aching    Pain Type  Acute pain    Pain Onset  More than a month ago    Aggravating Factors   malpositioning    Pain Relieving Factors  heat       Therapeutic Exercise Reviewed  pt's previous HEP exercises for lower body strength and balance, posture:  -Seated chin tucks x 5 reps, 2 sets; cues provided for correct technique  -Seated resisted R ankle dorsiflexion with green band x 10 reps -Standing hip flexion, abduction, extension, 10 reps each, both legs, green theraband (PT provides cues for upright posture, for slowed, correct technique.  For abduction, cues to avoid hip external rotation compensation)   Sidestepping in Squat with Resistance and Arms Forward - 10 reps - 2 sets - 1x daily - 6x weekly -Verbally reviewed and pt verb. understanding  Forward Step Touch - 10 reps -pt return demo  Walking Tandem Stance - 10 steps-pt return demo  Single Leg Stance on Foam Pad - 3 reps - 1 sets - 10 hold - 1x daily - 6x weekly -verbally reviewed, and pt verb. understanding  Braided Sidestepping - 10 ft, R and L-pt return demo understanding    SLS:  RLE 10 seconds, LLE 10 seconds                      PT Education - 09/09/18 0700    Education Details  Reviewed resisted band exercises  from earlier HEP (pt has not been doing consistently) and gave another green theraband; discussed plans for next several visits for dry needling    Person(s) Educated  Patient   through interpreter   Methods  Explanation;Verbal cues;Handout    Comprehension  Verbalized understanding;Returned demonstration;Verbal cues required       PT Short Term Goals - 08/19/18 1453      PT SHORT TERM GOAL #1   Title  = LTG        PT Long Term Goals - 09/09/18 7564      PT LONG TERM GOAL #1   Title  The patient will be independent with progression of HEP for discharge.    Baseline  08/18/18: met with current HEP    Time  4    Period  Weeks    Status  Achieved      PT LONG TERM GOAL #2   Title  The patient will demonstrate return to climbing an A frame ladder with use of RUE and independent ability to balance, step up with R LE for return to household and work  activities.    Baseline  09/09/2018:  performs in clinic with supervision, has not yet done at home    Time  4    Period  Weeks    Status  Partially Met      PT LONG TERM GOAL #3   Title  Pt will demonstrate improvement in overall function as indicated by FOTO score of 77    Baseline  67% functional intake; 57% 09/09/2018    Time  4    Period  Weeks    Status  Not Met      PT LONG TERM GOAL #4   Title  The patient will be able to return to yard work activities (discusses wanting to mow the yard-- fairly level surface, however this may depend more on R shoulder pain than balance/gait).      Baseline  08/18/18: has started with mowing, however needs help to start mower and only able push with left UE. Only mowed for 10-15 minute increments so not to fatigue.     Time  4    Period  Weeks    Status  Revised      PT LONG TERM GOAL #5   Title  The patient will be able to walk while carrying items (grocery bags, household items, etc).      Baseline  08/18/18: has been some, ~20-30% return to pre stroke activity    Time  4    Period  Weeks    Status  Partially Met            Plan - 09/09/18 0705    Clinical Impression Statement  Reviewed pt's lower extremity strengthening HEP and began to assess LTGs.  Pt needs reminder cues for technqiue for strengthening HEP, as he doesn't appear to be performing as much at home.  LTG 1 for HEP was previously met, but pt needs cues today with HPE.  LTG 2 partially met for ladder negotiation; LTG 3 not met (FOTO score as pt reported, as decreased, but he reports overall improved functional mobility); LTG 5 partially met.  Shoulder pain is limiting further carrying items and pushing/starting lawnmower.  Next visit PT will address potential for dry needling again to address shoulder pain.    Personal Factors and Comorbidities  Comorbidity 2;Past/Current Experience    Comorbidities  hyperlipidemia, DM    Examination-Activity Limitations  Carry;Stairs;Locomotion Level;Transfers    Examination-Participation Restrictions  Community Activity;Yard Work;Other    Stability/Clinical Decision Making  Evolving/Moderate complexity    Rehab Potential  Good    PT Frequency  2x / week    PT Duration  8 weeks   anticipate D/C in 4   PT Treatment/Interventions  ADLs/Self Care Home Management;Electrical Stimulation;DME Instruction;Gait training;Stair training;Functional mobility training;Therapeutic activities;Therapeutic exercise;Orthotic Fit/Training;Patient/family education;Neuromuscular re-education;Balance training;Manual techniques;Spinal Manipulations;Aquatic Therapy;Cryotherapy;Moist Heat;Passive range of motion;Dry needling    PT Next Visit Plan  plan to see Letta Moynahan for dry needling on 09/10/2018 to further assess for shoulder pain/postural exercises (he had difficulty with some of the exercises you gave him for shoulder/trunk when I checked several weeks ago)    PT Home Exercise Plan  Access Code: [email or web address] , shoulder stretches: [email or web address]    Consulted and Agree with Plan of Care  Patient    Family Member Consulted  Daughter       Patient will benefit from skilled therapeutic intervention in order to improve the following deficits and impairments:  Abnormal gait, Decreased balance, Decreased mobility, Difficulty walking, Decreased strength, Impaired flexibility, Impaired tone, Postural dysfunction, Pain, Increased muscle spasms, Decreased range of motion  Visit Diagnosis: 1. Muscle weakness (generalized)        Problem List Patient Active Problem List   Diagnosis Date Noted  . 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 ([place]) 08/02/2018  . Left pontine cerebrovascular accident ([place]) 05/11/2018  . Tobacco abuse   . Uncontrolled type 2 diabetes mellitus with hyperglycemia ([place])   . CVA (cerebral vascular accident) ([place]) 05/06/2018  . Hyperlipidemia 05/30/2017    Ison Wichmann W. 09/09/2018, 7:11  AM Frazier Butt., PT [facility] [address] [place], [place], [address] Phone: [phone]   Fax:  [phone]  Name: Arthur Howard MRN: [id number] Date of Birth: 20-Sep-1965

## 2018-09-10 ENCOUNTER — Encounter: Payer: Self-pay | Admitting: Physical Therapy

## 2018-09-10 ENCOUNTER — Other Ambulatory Visit: Payer: Self-pay

## 2018-09-10 ENCOUNTER — Ambulatory Visit: Payer: Commercial Indemnity | Admitting: Physical Therapy

## 2018-09-10 DIAGNOSIS — M6281 Muscle weakness (generalized): Secondary | ICD-10-CM | POA: Diagnosis not present

## 2018-09-10 DIAGNOSIS — I69351 Hemiplegia and hemiparesis following cerebral infarction affecting right dominant side: Secondary | ICD-10-CM

## 2018-09-10 DIAGNOSIS — R293 Abnormal posture: Secondary | ICD-10-CM

## 2018-09-10 NOTE — Patient Instructions (Signed)
Mirror Therapy for the Right Arm:  Sit in a chair with a long mirror between your legs.  Have your right arm behind the mirror resting comfortably.  Looking in the mirror - pay attention to the arm in the mirror.  Move your left arm in a variety of movements.  Up and down, out and in, bend and extend elbow, lift light weights, hold a glass, fork, squeeze a ball, toss a ball up and down.  Anything you would do with your right arm!  Perform for about 10 minutes and work up to 30 minutes a day.

## 2018-09-10 NOTE — Therapy (Signed)
[facility] [address] [place], [place], [address] Phone: [phone]   Fax:  [phone]  Physical Therapy Treatment  Patient Details  Name: Arthur Howard MRN: [id number] Date of Birth: 16-May-1965 Referring Provider (PT): Bretta Bang referred from hospital; f/u is with Venancio Poisson, NP   Encounter Date: 09/10/2018  PT End of Session - 09/10/18 1108    Visit Number  27    Number of Visits  30   per recert   Date for PT Re-Evaluation  09/25/18   per 4 week recert   Authorization Type  [employer]-60 visits combined PT and OT if on same day    Authorization - Visit Number  29    Authorization - Number of Visits  60    PT Start Time  8338    PT Stop Time  1045    PT Time Calculation (min)  57 min    Activity Tolerance  Patient tolerated treatment well;No increased pain    Behavior During Therapy  WFL for tasks assessed/performed       Past Medical History:  Diagnosis Date  . Diabetes mellitus without complication ([place])   . Hyperlipidemia     Past Surgical History:  Procedure Laterality Date  . ANKLE SURGERY      There were no vitals filed for this visit.  Subjective Assessment - 09/10/18 0951    Subjective  Pain is about a 4/10 this morning; still the most painful at night.  Reports it starts hurting as soon as he lies down in supine.  Has performed recommended positioning.  When he lies prone it feels better until he turns his head to the left.  Tries to not take the pain medicine if he doesn't have to.    Patient is accompained by:  Family member    Patient Stated Goals  Pt's goal is to improve R hand, arm, leg strength.    Currently in Pain?  Yes    Pain Score  4     Pain Location  Arm    Pain Orientation  Right    Pain Descriptors / Indicators  Aching    Pain Type  Neuropathic pain    Pain Onset  More than a month ago                       [facility] Adult PT  Treatment/Exercise - 09/10/18 1103      Neuro Re-ed    Neuro Re-ed Details   Seated on mat following TDN educated pt and daughter on neuroscience of pain and use of mirror therapy for RUE pain and activation training.  Pt return demonstrated use of mirror therapy while performing LUE shoulder flexion, elbow flexion <> extension, pronation/supination, ball squeeze, ball toss while attending to "right"arm in the mirror.  Provided mirror therapy for home      Exercises   Exercises  Neck;Shoulder      Shoulder Exercises: Stretch   External Rotation Stretch  1 rep;60 seconds   RUE on wall, rotating away from RUE   Other Shoulder Stretches  Anterior chest - pec major and minor passive stretches in supine, 2 sets x 60 seconds following TDN      Neck Exercises: Stretches   Upper Trapezius Stretch  Right;1 rep;60 seconds    Upper Trapezius Stretch Limitations  PROM in supine after TDN       Trigger Point Dry Needling - 09/10/18 2505  Consent Given?  Yes    Muscles Treated Head and Neck  Upper trapezius    Muscles Treated Upper Quadrant  Pectoralis major;Pectoralis minor;Infraspinatus    Dry Needling Comments  Performed in supine and in prone.  For R upper trap utilized 3 needles: 0.3 x 60; for pec major/minor utilized 2 needles 0.3 x 60; for infraspinatus utilized 3 needles 0.3 x 25    Upper Trapezius Response  Twitch reponse elicited    Pectoralis Major Response  Twitch response elicited    Pectoralis Minor Response  Twitch response elicited    Infraspinatus Response  Twitch response elicited           PT Education - 09/10/18 1101    Education Details  Mirror therapy for [employer]) Educated  Patient;Child(ren)    Methods  Explanation;Demonstration;Handout    Comprehension  Verbalized understanding;Returned demonstration       PT Short Term Goals - 08/19/18 1453      PT SHORT TERM GOAL #1   Title  = LTG        PT Long Term Goals - 09/09/18 4196      PT LONG TERM  GOAL #1   Title  The patient will be independent with progression of HEP for discharge.    Baseline  08/18/18: met with current HEP    Time  4    Period  Weeks    Status  Achieved      PT LONG TERM GOAL #2   Title  The patient will demonstrate return to climbing an A frame ladder with use of RUE and independent ability to balance, step up with R LE for return to household and work activities.    Baseline  09/09/2018:  performs in clinic with supervision, has not yet done at home    Time  4    Period  Weeks    Status  Partially Met      PT LONG TERM GOAL #3   Title  Pt will demonstrate improvement in overall function as indicated by FOTO score of 77    Baseline  67% functional intake; 57% 09/09/2018    Time  4    Period  Weeks    Status  Not Met      PT LONG TERM GOAL #4   Title  The patient will be able to return to yard work activities (discusses wanting to mow the yard-- fairly level surface, however this may depend more on R shoulder pain than balance/gait).      Baseline  08/18/18: has started with mowing, however needs help to start mower and only able push with left UE. Only mowed for 10-15 minute increments so not to fatigue.     Time  4    Period  Weeks    Status  Revised      PT LONG TERM GOAL #5   Title  The patient will be able to walk while carrying items (grocery bags, household items, etc).      Baseline  08/18/18: has been some, ~20-30% return to pre stroke activity    Time  4    Period  Weeks    Status  Partially Met            Plan - 09/10/18 1109    Clinical Impression Statement  Treatment session focused on continued use of trigger point dry needling to address increased mm tension and pain in neck, shoulder and scapula regions on  R to improve available ROM and decrease pain.  Also incorporated passive and active stretches following TDN and use of mirror therapy for NMR.  Pt tolerated well and reported a decrease in pain by end of session.  Will continue to  address in order to progress towards LTG.    Personal Factors and Comorbidities  Comorbidity 2;Past/Current Experience    Comorbidities  hyperlipidemia, DM    Examination-Activity Limitations  Carry;Stairs;Locomotion Level;Transfers    Examination-Participation Restrictions  Community Activity;Yard Work;Other    Stability/Clinical Decision Making  Evolving/Moderate complexity    Rehab Potential  Good    PT Frequency  2x / week    PT Duration  8 weeks   anticipate D/C in 4   PT Treatment/Interventions  ADLs/Self Care Home Management;Electrical Stimulation;DME Instruction;Gait training;Stair training;Functional mobility training;Therapeutic activities;Therapeutic exercise;Orthotic Fit/Training;Patient/family education;Neuromuscular re-education;Balance training;Manual techniques;Spinal Manipulations;Aquatic Therapy;Cryotherapy;Moist Heat;Passive range of motion;Dry needling    PT Next Visit Plan  Did he work on mirror therapy?  Continue TDN and review shoulder exercises.  Activate mm within new available range    PT Home Exercise Plan  Access Code: [email or web address] , shoulder stretches: [email or web address]    Consulted and Agree with Plan of Care  Patient    Family Member Consulted  Daughter       Patient will benefit from skilled therapeutic intervention in order to improve the following deficits and impairments:  Abnormal gait, Decreased balance, Decreased mobility, Difficulty walking, Decreased strength, Impaired flexibility, Impaired tone, Postural dysfunction, Pain, Increased muscle spasms, Decreased range of motion  Visit Diagnosis: 1. Muscle weakness (generalized)   2. Hemiplegia and hemiparesis following cerebral infarction affecting right dominant side ([place])   3. Abnormal posture        Problem List Patient Active Problem List   Diagnosis Date Noted  . 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 ([place]) 08/02/2018  . Left pontine cerebrovascular accident ([place])  05/11/2018  . Tobacco abuse   . Uncontrolled type 2 diabetes mellitus with hyperglycemia ([place])   . CVA (cerebral vascular accident) ([place]) 05/06/2018  . Hyperlipidemia 05/30/2017    Rico Junker, PT, DPT 09/10/18    11:14 AM    [facility] [address] [place], [place], [address] Phone: [phone]   Fax:  [phone]  Name: Arthur Howard MRN: [id number] Date of Birth: Mar 30, 1965

## 2018-09-15 ENCOUNTER — Ambulatory Visit: Payer: Commercial Indemnity | Admitting: Physical Therapy

## 2018-09-15 ENCOUNTER — Ambulatory Visit: Payer: Commercial Indemnity | Admitting: Occupational Therapy

## 2018-09-15 ENCOUNTER — Other Ambulatory Visit: Payer: Self-pay

## 2018-09-15 DIAGNOSIS — M6281 Muscle weakness (generalized): Secondary | ICD-10-CM

## 2018-09-15 DIAGNOSIS — R293 Abnormal posture: Secondary | ICD-10-CM

## 2018-09-15 DIAGNOSIS — I69351 Hemiplegia and hemiparesis following cerebral infarction affecting right dominant side: Secondary | ICD-10-CM

## 2018-09-15 DIAGNOSIS — R278 Other lack of coordination: Secondary | ICD-10-CM

## 2018-09-15 DIAGNOSIS — R2689 Other abnormalities of gait and mobility: Secondary | ICD-10-CM

## 2018-09-15 NOTE — Therapy (Signed)
[facility] [address] [place], [place], [address] Phone: [phone]   Fax:  [phone]  Occupational Therapy Treatment  Patient Details  Name: Arthur Howard MRN: [id number] Date of Birth: January 09, 1966 No data recorded  Encounter Date: 09/15/2018  OT End of Session - 09/15/18 1914    Visit Number  27    Number of Visits  33    Authorization Type  [employer]    Authorization Time Period  12 weeks, 60 visit limit, counts as 1 visit if PT/OTsame day    Authorization - Visit Number  27    Authorization - Number of Visits  60    OT Start Time  2297    OT Stop Time  1345    OT Time Calculation (min)  42 min    Activity Tolerance  Patient limited by pain    Behavior During Therapy  WFL for tasks assessed/performed       Past Medical History:  Diagnosis Date  . Diabetes mellitus without complication ([place])   . Hyperlipidemia     Past Surgical History:  Procedure Laterality Date  . ANKLE SURGERY      There were no vitals filed for this visit.  Subjective Assessment - 09/15/18 1919    Subjective   Pt reports continued shoulder and hand pain    Limitations  non English speaking- MD gave pt shoulder injection and recommended limiting shoulder flexion to approx 90* fo 1 week, pt was diagnosed with frzen shoulder and shaoulder hand syndrome by Dr. Letta Pate,    Currently in Pain?  Yes    Pain Score  2     Pain Location  Shoulder   hand   Pain Orientation  Right    Pain Descriptors / Indicators  Aching    Pain Type  Chronic pain    Pain Onset  More than a month ago    Pain Frequency  Constant    Aggravating Factors   malpositioning    Pain Relieving Factors  repositioning                CLINIC OPERATION CHANGES: [facility] is open at lower capacity following universal masking, social distancing, and patient screening.  The patient's COVID risk of complications score is 3  [place] 1 mhz, 0.8W/cm2  continuous x 8 mins to right upper arm, for pain and stiffness,with hotpack on hand, no adverse reactions. Scrubbing tabletops with RUE and towel min v.c Sidelying scapular mobilization, and gentle shoulder flexion AA/ROM closed chain shoulder flexion, chest press and triceps extension in supine with min facilitation/ v.c for shoulder/ scapular positioning Wall slides, wall push ups x 10 reps min v.c,  UE ranger for shoulder flexion mid-high range and circumduction, abduction and external rotation min facilitation/ v.c Unfortunately pt's RUE functional use is limited by pain and stiffness. He is encouraged to use hand more at home for light tasks.             OT Short Term Goals - 08/31/18 1117      OT SHORT TERM GOAL #1   Title  I with inital HEP.    Time  6    Period  Weeks    Status  Achieved    Target Date  07/11/18      OT SHORT TERM GOAL #2   Title  Pt will demonstrate  improved RUE functional use as evidenced by increasing box/ blocks score by 8 blocks.  Baseline  RUE 48, LUE 75    Time  6    Period  Weeks    Status  Achieved   64 blocks     OT SHORT TERM GOAL #3   Title  Pt will demonstrate ability to retrieve a light weight object from overhead shelf at 120 shoulder flexion with no more than min compensations.    Time  6    Period  Weeks    Status  On-going   mod compensations and intermittant discomfort     OT SHORT TERM GOAL #4   Title  Pt will perfrom all basic ADLS modified independently.    Time  6    Period  Weeks    Status  Achieved   07/13/18     OT SHORT TERM GOAL #5   Title  Pt will increase RUE grip strength to 35 lbs of greater for increased functional use.    Time  6    Period  Weeks    Status  On-going   16 lbs, 20 lbs     OT SHORT TERM GOAL #6   Title  Pt will report using his RUE to assist with ADLs at least 50% of the time or greater.    Time  12    Period  Weeks    Status  Achieved   07/13/18: met per pt report       OT  Long Term Goals - 08/31/18 1103      OT LONG TERM GOAL #1   Title  Pt will demonstrate ability to retrieve a 3 lbs weight from overhead shelf demonstrating good control with RUE.    Status  On-going   unable due to decreased RUE contol     OT LONG TERM GOAL #2   Title  Pt will perform mod complex home managment modified independently.    Status  On-going   Pt has perfromed some home management however he is not back to prior level of function     OT LONG TERM GOAL #3   Title  Pt will demonstrate improved RUE fine motor coordination for ADLS as evidenced by completing 9 hole peg test in 38 secs or less.    Time  12    Period  Weeks    Status  On-going      OT LONG TERM GOAL #4   Title  Pt will report that he has returned to using his RUE as dominant hand at least 75% of the time for ADLs/IADLs..    Status  On-going   uses grossly 505 as non dominant assist     OT LONG TERM GOAL #5   Title  Pt will demonstrate ability to write his name and address legibily using RUE.    Status  On-going      OT LONG TERM GOAL #6   Title  Pt will perform simulated work activities modified indpendently demonstrating good safety awareness.    Status  On-going            Plan - 09/15/18 1917    Clinical Impression Statement  Pt is progressing towards goals with decreasing pain and slowly increasing ROM.    OT Occupational Profile and History  Detailed Assessment- Review of Records and additional review of physical, cognitive, psychosocial history related to current functional performance    Occupational performance deficits (Please refer to evaluation for details):  ADL's;IADL's;Work;Leisure;Social Participation    Body Structure / Function / Physical Skills  ADL;Dexterity;Flexibility;ROM;Strength;IADL;FMC;Coordination;Balance;UE functional use;GMC;Gait;Endurance;Decreased knowledge of precautions;Sensation    Rehab Potential  Good    OT Frequency  2x / week    OT Duration  8 weeks    OT  Treatment/Interventions  Self-care/ADL training;Cryotherapy;Paraffin;Therapeutic exercise;DME and/or AE instruction;Functional Mobility Training;Balance training;Splinting;Manual Therapy;Neuromuscular education;Fluidtherapy;Ultrasound;Aquatic Therapy;Electrical Stimulation;Moist Heat;Contrast Bath;Energy conservation;Passive range of motion;Therapeutic activities;Patient/family education    Plan   continue NMR and scapula depression and hand function/ROM    Consulted and Agree with Plan of Care  Patient    Family Member Consulted  video interpreter though status       Patient will benefit from skilled therapeutic intervention in order to improve the following deficits and impairments:   Body Structure / Function / Physical Skills: ADL, Dexterity, Flexibility, ROM, Strength, IADL, FMC, Coordination, Balance, UE functional use, GMC, Gait, Endurance, Decreased knowledge of precautions, Sensation       Visit Diagnosis: 1. Muscle weakness (generalized)   2. Hemiplegia and hemiparesis following cerebral infarction affecting right dominant side ([facility])   3. Other lack of coordination   4. Other abnormalities of gait and mobility   5. Abnormal posture       Problem List Patient Active Problem List   Diagnosis Date Noted  . 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 ([place]) 08/02/2018  . Left pontine cerebrovascular accident ([place]) 05/11/2018  . Tobacco abuse   . Uncontrolled type 2 diabetes mellitus with hyperglycemia ([place])   . CVA (cerebral vascular accident) ([place]) 05/06/2018  . Hyperlipidemia 05/30/2017    Cortez Steelman 09/15/2018, 7:20 PM  [facility] [address] [place], [place], [address] Phone: [phone]   Fax:  [phone]  Name: Arthur Howard MRN: [id number] Date of Birth: 03-05-1966

## 2018-09-15 NOTE — Patient Instructions (Signed)
Access Code: [email or web address]  URL: [email or web address]  Date: 09/15/2018  Prepared by: Misty Stanley   Exercises Sidelying Mid Thoracic Rotation - 10 reps - 1 sets - 2x daily - 7x weekly Supine Scapular Retraction - 10 reps - 3 sets - 1x daily - 7x weekly                  Supine Lower Trapezius Strengthening - 10 reps - 3 sets - 1x daily - 7x weekly Standing Shoulder External Rotation Stretch at Wall - 2 sets - 30 second hold - 1x daily - 7x weekly Standing Shoulder Extension with [place] - 10 reps - 2 sets - 2x daily - 7x weekly

## 2018-09-15 NOTE — Therapy (Signed)
[facility] [address] [place], [place], [address] Phone: [phone]   Fax:  [phone]  Physical Therapy Treatment  Patient Details  Name: Arthur Howard MRN: [id number] Date of Birth: [birth year] Referring Provider (PT): Bretta Bang referred from hospital; f/u is with Venancio Poisson, NP   Encounter Date: 09/15/2018   CLINIC OPERATION CHANGES: Outpatient Neuro Rehab is open at lower capacity following universal masking, social distancing, and patient screening.  The patient's COVID risk of complications score is 3.   PT End of Session - 09/15/18 0945    Visit Number  28    Number of Visits  30   per recert   Date for PT Re-Evaluation  09/25/18   per 4 week recert   Authorization Type  [employer]-60 visits combined PT and OT if on same day    Authorization - Visit Number  30    Authorization - Number of Visits  60    PT Start Time  0800    PT Stop Time  0845    PT Time Calculation (min)  45 min    Activity Tolerance  Patient tolerated treatment well;No increased pain    Behavior During Therapy  WFL for tasks assessed/performed       Past Medical History:  Diagnosis Date  . Diabetes mellitus without complication ([place])   . Hyperlipidemia     Past Surgical History:  Procedure Laterality Date  . ANKLE SURGERY      There were no vitals filed for this visit.  Subjective Assessment - 09/15/18 0812    Subjective  Felt a little better after the dry needling but the next day the "inflammation returned".  Still a 4/10 during the day, greater pain at night.  Using heat helps.  Still very stiff in the morning.  Would like to work on stretches today.    Patient is accompained by:  Family member    Patient Stated Goals  Pt's goal is to improve R hand, arm, leg strength.    Currently in Pain?  Yes    Pain Score  4     Pain Location  Arm    Pain Orientation  Right    Pain Descriptors / Indicators   Tightness    Pain Onset  More than a month ago                       [facility] Adult PT Treatment/Exercise - 09/15/18 4917      Neuro Re-ed    Neuro Re-ed Details   Continued use of seated mirror therapy with vision of L hand blocked and having RUE perform various UE movements: finger opposition, hand opening and closing, squeezing a ball, low tossing a ball, pronation and supination, elbow flexion<>extension, shoulder flexion <>extension, ABD<>ADD, holding a 1lb weight and performing shoulder flexion and overhead press, holding a pen and air tracing each letter of the alphabet.      Exercises   Exercises  Neck;Shoulder      Shoulder Exercises: Prone   Retraction  Strengthening;Right;10 reps    Retraction Limitations  first in prone and then in supine due to increased pain in prone and requiring therapist assistance to perform.  Pt able to perform independently in supine isometrically    Extension  Strengthening;Right;10 reps    Extension Limitations  first in prone and then in supine due to increased pain in prone and requiring therapist assistance to perform.  Pt  able to perform independently in supine isometrically      Shoulder Exercises: Stretch   Corner Stretch  2 reps;10 seconds    Corner Stretch Limitations  single RUE doorway stretch into ER; unable to hold or tolerate >10 seconds today.  Returned to mat to perform stretch in supine    External Rotation Stretch  Other (comment)    Other Shoulder Stretches  Continued to perform anterior chest stretch in supine with RUE abducted and supported on mat and performing trunk rotations to R and then back to the left - rotating trunk and knees to L while keeping RUE ABD and ER x 10 reps.  Transitioned to prone with RUE slightly ABD and attempted to use LUE to push into prone ER but pt unable to tolerate today      Modalities   Modalities  Moist Heat      Moist Heat Therapy   Number Minutes Moist Heat  20 Minutes    Moist Heat  Location  Shoulder   while performing mirror therapy and stretches            PT Education - 09/15/18 0945    Education Details  Will revise shoulder exercises to perform in AM and PM    Person(s) Educated  Patient    Methods  Explanation;Demonstration    Comprehension  Need further instruction       PT Short Term Goals - 08/19/18 1453      PT SHORT TERM GOAL #1   Title  = LTG        PT Long Term Goals - 09/09/18 2440      PT LONG TERM GOAL #1   Title  The patient will be independent with progression of HEP for discharge.    Baseline  08/18/18: met with current HEP    Time  4    Period  Weeks    Status  Achieved      PT LONG TERM GOAL #2   Title  The patient will demonstrate return to climbing an A frame ladder with use of RUE and independent ability to balance, step up with R LE for return to household and work activities.    Baseline  09/09/2018:  performs in clinic with supervision, has not yet done at home    Time  4    Period  Weeks    Status  Partially Met      PT LONG TERM GOAL #3   Title  Pt will demonstrate improvement in overall function as indicated by FOTO score of 77    Baseline  67% functional intake; 57% 09/09/2018    Time  4    Period  Weeks    Status  Not Met      PT LONG TERM GOAL #4   Title  The patient will be able to return to yard work activities (discusses wanting to mow the yard-- fairly level surface, however this may depend more on R shoulder pain than balance/gait).      Baseline  08/18/18: has started with mowing, however needs help to start mower and only able push with left UE. Only mowed for 10-15 minute increments so not to fatigue.     Time  4    Period  Weeks    Status  Revised      PT LONG TERM GOAL #5   Title  The patient will be able to walk while carrying items (grocery bags, household items, etc).  Baseline  08/18/18: has been some, ~20-30% return to pre stroke activity    Time  4    Period  Weeks    Status  Partially  Met            Plan - 09/15/18 0946    Clinical Impression Statement  Pt did not feel dry needling provided enough relief or improvement in ROM to perform today; instead focused on reviewing neck and shoulder stretches and exercises to improve pain/tension, ROM and strength combined with heat.  Pt unable to tolerate prone or doorway stretches today but able to perform trunk rotation and supine activation.  Will contiue to review and assess most appropriate and beneficial exercises for pt to perform at home prior to D/C.    Personal Factors and Comorbidities  Comorbidity 2;Past/Current Experience    Comorbidities  hyperlipidemia, DM    Examination-Activity Limitations  Carry;Stairs;Locomotion Level;Transfers    Examination-Participation Restrictions  Community Activity;Yard Work;Other    Stability/Clinical Decision Making  Evolving/Moderate complexity    Rehab Potential  Good    PT Frequency  2x / week    PT Duration  8 weeks   anticipate D/C in 4   PT Treatment/Interventions  ADLs/Self Care Home Management;Electrical Stimulation;DME Instruction;Gait training;Stair training;Functional mobility training;Therapeutic activities;Therapeutic exercise;Orthotic Fit/Training;Patient/family education;Neuromuscular re-education;Balance training;Manual techniques;Spinal Manipulations;Aquatic Therapy;Cryotherapy;Moist Heat;Passive range of motion;Dry needling    PT Next Visit Plan  Final visit; review final recommendations for shoulder exercises, D/C    PT Home Exercise Plan  Access Code: [email or web address] , shoulder stretches: [email or web address]    Consulted and Agree with Plan of Care  Patient       Patient will benefit from skilled therapeutic intervention in order to improve the following deficits and impairments:  Abnormal gait, Decreased balance, Decreased mobility, Difficulty walking, Decreased strength, Impaired flexibility, Impaired tone, Postural dysfunction, Pain, Increased muscle spasms, Decreased range of  motion  Visit Diagnosis: 1. Muscle weakness (generalized)   2. Hemiplegia and hemiparesis following cerebral infarction affecting right dominant side ([place])   3. Abnormal posture        Problem List Patient Active Problem List   Diagnosis Date Noted  . 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 Left pontine cerebrovascular accident ([place]) 05/11/2018  . Tobacco abuse   . Uncontrolled type 2 diabetes mellitus with hyperglycemia ([place])   . CVA (cerebral vascular accident) ([place]) 05/06/2018  . Hyperlipidemia 05/30/2017    Rico Junker, PT, DPT 09/15/18    9:51 AM    [facility] [address] [place], [place], [address] Phone: [phone]   Fax:  [phone]  Name: Arthur Howard MRN: [id number] Date of Birth: 19-Oct-1965

## 2018-09-16 ENCOUNTER — Encounter: Payer: Commercial Indemnity | Attending: Registered Nurse | Admitting: Physical Medicine & Rehabilitation

## 2018-09-16 ENCOUNTER — Encounter: Payer: Self-pay | Admitting: Physical Medicine & Rehabilitation

## 2018-09-16 VITALS — BP 127/77 | HR 65 | Temp 97.7°F | Ht 62.0 in | Wt 153.0 lb

## 2018-09-16 DIAGNOSIS — E7849 Other hyperlipidemia: Secondary | ICD-10-CM | POA: Diagnosis present

## 2018-09-16 DIAGNOSIS — E1165 Type 2 diabetes mellitus with hyperglycemia: Secondary | ICD-10-CM | POA: Insufficient documentation

## 2018-09-16 DIAGNOSIS — Z79899 Other long term (current) drug therapy: Secondary | ICD-10-CM

## 2018-09-16 DIAGNOSIS — M8909 Algoneurodystrophy, multiple sites: Secondary | ICD-10-CM

## 2018-09-16 DIAGNOSIS — Z5181 Encounter for therapeutic drug level monitoring: Secondary | ICD-10-CM | POA: Diagnosis not present

## 2018-09-16 DIAGNOSIS — M7501 Adhesive capsulitis of right shoulder: Secondary | ICD-10-CM

## 2018-09-16 DIAGNOSIS — M7918 Myalgia, other site: Secondary | ICD-10-CM

## 2018-09-16 DIAGNOSIS — I69351 Hemiplegia and hemiparesis following cerebral infarction affecting right dominant side: Secondary | ICD-10-CM | POA: Diagnosis not present

## 2018-09-16 DIAGNOSIS — I6350 Cerebral infarction due to unspecified occlusion or stenosis of unspecified cerebral artery: Secondary | ICD-10-CM | POA: Diagnosis present

## 2018-09-16 MED ORDER — TRAMADOL HCL 50 MG PO TABS: 50.0000 mg | ORAL_TABLET | Freq: Every day | ORAL | 1 refills | Status: DC

## 2018-09-16 NOTE — Progress Notes (Signed)
Subjective:    Patient ID: Arthur Howard, male    DOB: November 09, 1965, 53 y.o.   MRN: [id number]  HPI   53 year old Hispanic male with history of diabetes hyperlipidemia and hypertension who suffered a left pontine infarct in February 2020 which resulted in an inpatient rehabilitation stay.  His date of discharge was 05/21/2018.  He did not receive home health services but instead attended outpatient rehab since 05/27/2018.  Patient has regained independent ambulation.  He still has significant limitations in his right upper extremity strength  CC Right shoulder = hand pain Injections 07/16/18 Right glenohumeral inj- very helpful 5/28 R levator scap Trigger point- mildly helpful  Meds  Medrol dosepack 06/2018 minimal improvement Tramadol helpful at night for hand pain. Continues with gabapentin 300 mg twice daily Pain Inventory Average Pain 6 Pain Right Now 3 My pain is sharp, burning and aching  In the last 24 hours, has pain interfered with the following? General activity 8 Relation with others 3 Enjoyment of life 3 What TIME of day is your pain at its worst? night Sleep (in general) Fair  Pain is worse with: sitting, standing and some activites Pain improves with: medication Relief from Meds: 6  Mobility walk without assistance ability to climb steps?  yes do you drive?  no  Function not employed: date last employed .  Neuro/Psych No problems in this area  Prior Studies Any changes since last visit?  no  Physicians involved in your care Any changes since last visit?  no   Family History  Problem Relation Age of Onset  . Heart disease Mother   . Lung cancer Father   . Diabetes Sister   . Diabetes Brother   . CVA Neg Hx    Social History   Socioeconomic History  . Marital status: Married    Spouse name: Verdis Frederickson  . Number of children: 7  . Years of education: Not on file  . Highest education level: 6th grade  Occupational History  . Occupation: Sales executive     Comment: [employer]  Social Needs  . Financial resource strain: Not on file  . Food insecurity    Worry: Not on file    Inability: Not on file  . Transportation needs    Medical: Not on file    Non-medical: Not on file  Tobacco Use  . Smoking status: Former Research scientist (life sciences)  . Smokeless tobacco: Never Used  . Tobacco comment: never a daily smoker  Substance and Sexual Activity  . Alcohol use: No    Alcohol/week: 0.0 standard drinks  . Drug use: No  . Sexual activity: Not on file  Lifestyle  . Physical activity    Days per week: Not on file    Minutes per session: Not on file  . Stress: Not on file  Relationships  . Social Herbalist on phone: Not on file    Gets together: Not on file    Attends religious service: Not on file    Active member of club or organization: Not on file    Attends meetings of clubs or organizations: Not on file    Relationship status: Not on file  Other Topics Concern  . Not on file  Social History Narrative   Works with Sales executive. Lives in [place]. Moved from [place] 35 years ago. He is married. Has 7 children (all girls).       Patient is right-handed. He lives with his wife in a one level home.  He walks daily for exercise.   Past Surgical History:  Procedure Laterality Date  . ANKLE SURGERY     Past Medical History:  Diagnosis Date  . Diabetes mellitus without complication ([place])   . Hyperlipidemia    BP 127/77   Pulse 65   Temp 97.7 F (36.5 C)   Ht 5\' 2"  (1.575 m)   Wt 153 lb (69.4 kg)   SpO2 97%   BMI 27.98 kg/m   Opioid Risk Score:   Fall Risk Score:  `1  Depression screen PHQ 2/9  Depression screen [facility] 2/9 08/19/2018 08/02/2018 07/05/2018 05/26/2018 05/30/2017 09/20/2016 09/18/2016  Decreased Interest 1 1 0 0 0 0 0  Down, Depressed, Hopeless 1 1 0 0 0 0 0  PHQ - 2 Score 2 2 0 0 0 0 0     Review of Systems  Constitutional: Negative.   HENT: Negative.   Eyes: Negative.   Respiratory: Negative.   Cardiovascular: Negative.    Gastrointestinal: Negative.   Endocrine: Negative.   Genitourinary: Negative.   Musculoskeletal: Negative.   Skin: Negative.   Allergic/Immunologic: Negative.   Neurological: Negative.   Hematological: Negative.   Psychiatric/Behavioral: Negative.   All other systems reviewed and are negative.      Objective:   Physical Exam Vitals signs and nursing note reviewed.  Constitutional:      Appearance: Normal appearance.  HENT:     Mouth/Throat:     Mouth: Mucous membranes are moist.  Eyes:     Extraocular Movements: Extraocular movements intact.     Conjunctiva/sclera: Conjunctivae normal.     Pupils: Pupils are equal, round, and reactive to light.  Musculoskeletal:     Comments: Tenderness to palpation in the right trapezius area as well as in the right levator scapular area. Positive impingement sign but also pain with external rotation and forward flexion of the right shoulder. There is mild dorsum of the hand edema.  There is also extensor contracture of the right hand and only able to flex at around 45 degrees at the MCP PIP and DIP area  Neurological:     Mental Status: He is alert.     Comments: Motor strength is 3- at the right deltoid bicep tricep to minus at the finger flexors and finger extensors. No hypersensitivity to touch over the fingers Tone no evidence of spasticity in the right upper extremity.  Psychiatric:        Mood and Affect: Mood normal.           Assessment & Plan:  1.  Left pontine infarct with right hemiparesis mainly affecting the upper extremity. Overall has made a good functional improvement particularly with mobility.  The right upper extremity still has limited function.  He is approximately 3 months post stroke and will likely have some significant limitations ongoing with his right upper extremity. He has several pain causing conditions affecting the right upper limb. 1.  Myofascial pain right shoulder girdle primarily affecting  trapezius and levator scapula.  He has had short-term relief with trigger point injection.  We will continue physical and Occupational Therapy.  We will continue medication management and may add tizanidine. 2.  Shoulder-hand syndrome symptoms have mainly subsided still has some vasomotor edema right upper extremity with some contracture at the hand we discussed referral for stellate ganglion block which she does not wish to pursue at the current time.  Certainly he is not hypersensitivity to touch so it does not appear to be  severe in terms of symptomatology. Continue tramadol nightly given that he is taking this on a daily basis we will do a controlled substance agreement as well as urine toxicology.  Reviewed PDMP 3.  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  Good results with intra-articular shoulder injection will repeat today on the right side.  Shoulder injection right Right glenohumeral   Indication:RIght Shoulder pain not relieved by medication management and other conservative care.  Informed consent was obtained after describing risks and benefits of the procedure with the patient, this includes bleeding, bruising, infection and medication side effects. The patient wishes to proceed and has given written consent. Patient was placed in a seated position. The Right shoulder was marked and prepped with betadine in the subacromial area. A 25-gauge 1-1/2 inch needle was inserted into the subacromial area. After negative draw back for blood, a solution containing 1 mL of 6 mg per ML betamethasone and 4 mL of 1% lidocaine was injected. A band aid was applied. The patient tolerated the procedure well. Post procedure instructions were given.

## 2018-09-16 NOTE — Patient Instructions (Signed)
Shoulder joint injection today May need to do One more in 1 month

## 2018-09-17 ENCOUNTER — Telehealth: Payer: Self-pay | Admitting: *Deleted

## 2018-09-17 ENCOUNTER — Ambulatory Visit: Payer: Commercial Indemnity | Admitting: Occupational Therapy

## 2018-09-17 ENCOUNTER — Ambulatory Visit: Payer: Commercial Indemnity | Admitting: Physical Therapy

## 2018-09-17 ENCOUNTER — Encounter: Payer: Self-pay | Admitting: Physical Therapy

## 2018-09-17 ENCOUNTER — Other Ambulatory Visit: Payer: Self-pay

## 2018-09-17 DIAGNOSIS — R278 Other lack of coordination: Secondary | ICD-10-CM

## 2018-09-17 DIAGNOSIS — R2681 Unsteadiness on feet: Secondary | ICD-10-CM

## 2018-09-17 DIAGNOSIS — M6281 Muscle weakness (generalized): Secondary | ICD-10-CM

## 2018-09-17 DIAGNOSIS — M79641 Pain in right hand: Secondary | ICD-10-CM

## 2018-09-17 DIAGNOSIS — I69351 Hemiplegia and hemiparesis following cerebral infarction affecting right dominant side: Secondary | ICD-10-CM

## 2018-09-17 DIAGNOSIS — R2689 Other abnormalities of gait and mobility: Secondary | ICD-10-CM

## 2018-09-17 DIAGNOSIS — R293 Abnormal posture: Secondary | ICD-10-CM

## 2018-09-17 DIAGNOSIS — M25511 Pain in right shoulder: Secondary | ICD-10-CM

## 2018-09-17 MED ORDER — TIZANIDINE HCL 4 MG PO TABS: 4.0000 mg | ORAL_TABLET | Freq: Every day | ORAL | 1 refills | Status: DC

## 2018-09-17 MED FILL — tiZANidine HCL 4 MG TABS: 4 | 30 days supply | Qty: 30 | Fill #0

## 2018-09-17 NOTE — Therapy (Signed)
[facility] [address] [place], [place], [address] Phone: [phone]   Fax:  [phone]  Occupational Therapy Treatment  Patient Details  Name: Arthur Howard MRN: [id number] Date of Birth: 07/17/1965 No data recorded  Encounter Date: 09/17/2018  OT End of Session - 09/17/18 1331    Visit Number  28    Number of Visits  33    Authorization Type  [employer]    Authorization Time Period  12 weeks, 60 visit limit, counts as 1 visit if PT/OTsame day    Authorization - Visit Number  28    Authorization - Number of Visits  74    OT Start Time  5465    OT Stop Time  1255    OT Time Calculation (min)  50 min    Activity Tolerance  Patient limited by pain    Behavior During Therapy  [facility] for tasks assessed/performed       Past Medical History:  Diagnosis Date  . Diabetes mellitus without complication ([place])   . Hyperlipidemia     Past Surgical History:  Procedure Laterality Date  . ANKLE SURGERY      There were no vitals filed for this visit.  Subjective Assessment - 09/17/18 1334    Subjective   Pt reports continued mild  shoulder and hand pain, pt reports that he had a cortizone injection yesterday    Limitations    Currently in Pain?  Yes    Pain Score  3     Pain Location  Arm   hand   Pain Orientation  Right    Pain Descriptors / Indicators  Aching    Pain Onset  More than a month ago    Pain Frequency  Intermittent    Aggravating Factors   malpositioning    Pain Relieving Factors  reposistioning, medication             CLINIC OPERATION CHANGES: Outpatient Neuro Rehab is open at lower capacity following universal masking, social distancing, and patient screening.  The patient's COVID risk of complications score is 3 Scrubbing tabletops with RUE and towel min v.c Sidelying scapular mobilization, and gentle shoulder flexion AA/ROM closed chain shoulder flexion, chest press and triceps extension in  supine with min facilitation/ v.c for shoulder/ scapular positioning Wall slides, wall push ups x 10 reps min v.c,  UE ranger for shoulder flexion mid-high range and circumduction, abduction and external rotation min facilitation/ v.c Therapist started checking progress towards goals as pt will be leaving for [place] the end of next week. See goals for progress. Pt demonstrates improved fine motor coordination today and he met his 9 hole peg test goal. Gripper set at level 1 to pick up 1 inch blocks for sustained grip. Mid reaching to place/ remove graded clothespins from vertical antennae, min v.c/ facilitation  Arm bike x 5 mins level 1 for reciprocal movement                   OT Short Term Goals - 09/17/18 1231      OT SHORT TERM GOAL #1   Title  I with inital HEP.    Time  6    Period  Weeks    Status  Achieved    Target Date  07/11/18      OT SHORT TERM GOAL #2   Title  Pt will demonstrate  improved RUE functional use as evidenced by increasing box/ blocks score by  8 blocks.    Baseline  RUE 48, LUE 75    Time  6    Period  Weeks    Status  Achieved   64 blocks 09/17/18-60 blocks     OT SHORT TERM GOAL #3   Title  Pt will demonstrate ability to retrieve a light weight object from overhead shelf at 120 shoulder flexion with no more than min compensations.    Time  6    Period  Weeks    Status  On-going   mod compensations and intermittant discomfort     OT SHORT TERM GOAL #4   Title  Pt will perfrom all basic ADLS modified independently.    Time  6    Period  Weeks    Status  Achieved   07/13/18     OT SHORT TERM GOAL #5   Title  Pt will increase RUE grip strength to 35 lbs of greater for increased functional use.    Time  6    Period  Weeks    Status  Not Met   20,21 lbs     OT SHORT TERM GOAL #6   Title  Pt will report using his RUE to assist with ADLs at least 50% of the time or greater.    Time  12    Period  Weeks    Status  Achieved    07/13/18: met per pt report       OT Long Term Goals - 09/17/18 1231      OT LONG TERM GOAL #1   Title  Pt will demonstrate ability to retrieve a 3 lbs weight from overhead shelf demonstrating good control with RUE.    Status  On-going   unable due to decreased RUE contol     OT LONG TERM GOAL #2   Title  Pt will perform mod complex home managment modified independently.    Status  Partially Met   Pt has performed some home management however he is not back to prior level of function     OT LONG TERM GOAL #3   Title  Pt will demonstrate improved RUE fine motor coordination for ADLS as evidenced by completing 9 hole peg test in 38 secs or less.    Time  12    Period  Weeks    Status  Achieved   29.65 secs     OT LONG TERM GOAL #4   Title  Pt will report that he has returned to using his RUE as dominant hand at least 75% of the time for ADLs/IADLs..    Status  Not Met   uses RUE as a non dominant assist 50% of the time     OT LONG TERM GOAL #5   Title  Pt will demonstrate ability to write his name and address legibily using RUE.    Status  Achieved      OT LONG TERM GOAL #6   Title  Pt will perform simulated work activities modified indpendently demonstrating good safety awareness.    Status  On-going            Plan - 09/17/18 1332    Clinical Impression Statement  Pt is progressing towards goals. Therapist started checking progress towards goals as pt reports he is going to [place] for 2 months starting next week.    OT Occupational Profile and History  Detailed Assessment- Review of Records and additional review of physical, cognitive, psychosocial history related to  current functional performance    Occupational performance deficits (Please refer to evaluation for details):  ADL's;IADL's;Work;Leisure;Social Participation    Body Structure / Function / Physical Skills  ADL;Dexterity;Flexibility;ROM;Strength;IADL;FMC;Coordination;Balance;UE functional  use;GMC;Gait;Endurance;Decreased knowledge of precautions;Sensation    Rehab Potential  Good    OT Frequency  2x / week    OT Duration  8 weeks    OT Treatment/Interventions  Self-care/ADL training;Cryotherapy;Paraffin;Therapeutic exercise;DME and/or AE instruction;Functional Mobility Training;Balance training;Splinting;Manual Therapy;Neuromuscular education;Fluidtherapy;Ultrasound;Aquatic Therapy;Electrical Stimulation;Moist Heat;Contrast Bath;Energy conservation;Passive range of motion;Therapeutic activities;Patient/family education    Plan   continue NMR and scapula depression and hand function/ROM    Consulted and Agree with Plan of Care  Patient    Family Member Consulted  video interpreter though status       Patient will benefit from skilled therapeutic intervention in order to improve the following deficits and impairments:   Body Structure / Function / Physical Skills: ADL, Dexterity, Flexibility, ROM, Strength, IADL, FMC, Coordination, Balance, UE functional use, GMC, Gait, Endurance, Decreased knowledge of precautions, Sensation       Visit Diagnosis: 1. Muscle weakness (generalized)   2. Hemiplegia and hemiparesis following cerebral infarction affecting right dominant side ([place])   3. Abnormal posture   4. Other lack of coordination   5. Pain in right hand   6. Acute pain of right shoulder   7. Other abnormalities of gait and mobility       Problem List Patient Active Problem List   Diagnosis Date Noted  . 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 ([place]) 08/02/2018  . Left pontine cerebrovascular accident ([place]) 05/11/2018  . Tobacco abuse   . Uncontrolled type 2 diabetes mellitus with hyperglycemia ([place])   . CVA (cerebral vascular accident) ([place]) 05/06/2018  . Hyperlipidemia 05/30/2017    Dorris Vangorder 09/17/2018, 1:36 PM  [facility] [address] [place], [place],  [address] Phone: [phone]   Fax:  [phone]  Name: Arthur Howard MRN: [id number] Date of Birth: [birth year]

## 2018-09-17 NOTE — Patient Instructions (Addendum)
Dorsiflexion: Resisted    De frente al punto de agarre de una banda elstica, con el otro extremo alrededor del pie izquierdo, lleve la punta del pie hacia usted. Repita _10-15___ veces por rutina. Realice __8__ Albertina [place] por sesin. Realice _2-5___ sesiones por da.  IN SEATED POSITION - HOLD BAND DOWN WITH LEFT FOOT  Strengthening: Hip Flexion - Resisted    Con una banda elstica alrededor del tobillo izquierdo, de espaldas al punto de agarre de la banda. Lleve la pierna hacia adelante manteniendo la rodilla extendida. Repita _10-15___ veces por rutina. Realice __7__ Albertina [place] por sesin. Realice _4-9___ sesiones por da.   Strengthening: Hip Extension - Resisted    Con una banda elstica alrededor del tobillo derecho, de frente al punto de agarre de la banda, lleve la pierna extendida [place] atrs. Repita _10-15___ veces por rutina. Realice 2____ rutinas por sesin. Realice __3-5__ sesiones por da.  Axial Extension (Chin Tuck)    Pull chin in and lengthen back of neck. Hold _3___ seconds while counting out loud. Repeat _10___ times. Do __1-2__ sessions per day.    Access Code: [email or web address]  URL: [email or web address]  Date: 09/17/2018  Prepared by: Misty Stanley   Exercises Sidestepping in Squat with Resistance and Arms Forward - 10 reps - 2 sets - 1x daily - 6x weekly Supine Lower Trunk Rotation - 10 reps - 1 sets - 1x daily - 7x weekly Tandem Stance with Head Rotation on Foam Pad - 10 reps - 3 sets - 1x daily - 7x weekly Single Leg Stance on Foam Pad - 10 reps - 1 sets - 10 second hold - 1x daily - 6x weekly

## 2018-09-17 NOTE — Telephone Encounter (Signed)
Arthur Howard daughter called about his muscle relaxer. She said she went to the pharmacy and they did not have it.

## 2018-09-17 NOTE — Therapy (Signed)
[facility] [address] [place], [place], [address] Phone: [phone]   Fax:  [phone]  Physical Therapy Treatment and D/C Summary  Patient Details  Name: Arthur Howard MRN: [id number] Date of Birth: Apr 07, 1965 Referring Provider (PT): Bretta Bang referred from hospital; f/u is with Venancio Poisson, NP   Encounter Date: 09/17/2018   CLINIC OPERATION CHANGES: Outpatient Neuro Rehab is open at lower capacity following universal masking, social distancing, and patient screening.  The patient's COVID risk of complications score is 3.   PT End of Session - 09/17/18 1350    Visit Number  29    Number of Visits  30   per recert   Date for PT Re-Evaluation  09/25/18   per 4 week recert   Authorization Type  C[employer]-60 visits combined PT and OT if on same day    Authorization - Visit Number  31    Authorization - Number of Visits  60    PT Start Time  1103    PT Stop Time  1343    PT Time Calculation (min)  38 min    Activity Tolerance  Patient tolerated treatment well    Behavior During Therapy  WFL for tasks assessed/performed       Past Medical History:  Diagnosis Date  . Diabetes mellitus without complication ([place])   . Hyperlipidemia     Past Surgical History:  Procedure Laterality Date  . ANKLE SURGERY      There were no vitals filed for this visit.  Subjective Assessment - 09/17/18 1306    Subjective  Arm is feeling better since receiving another injection and new medication.  Pt going to [place] next week for 2 months.    Patient is accompained by:  Family member    Patient Stated Goals  Pt's goal is to improve R hand, arm, leg strength.    Currently in Pain?  No/denies    Pain Onset  More than a month ago       Dorsiflexion: Resisted    De frente al punto de agarre de una banda elstica, con el otro extremo alrededor del pie izquierdo, lleve la punta del pie hacia usted. Repita  _10-15___ veces por rutina. Realice __1__ Albertina [place] por sesin. Realice _5-9___ sesiones por da.  IN SEATED POSITION - HOLD BAND DOWN WITH LEFT FOOT  Strengthening: Hip Flexion - Resisted    Con una banda elstica alrededor del tobillo izquierdo, de espaldas al punto de agarre de la banda. Lleve la pierna hacia adelante manteniendo la rodilla extendida. Repita _10-15___ veces por rutina. Realice __4__ Albertina [place] por sesin. Realice _5-8___ sesiones por da.   Strengthening: Hip Extension - Resisted    Con una banda elstica alrededor del tobillo derecho, de frente al punto de agarre de la banda, lleve la pierna extendida [place] atrs. Repita _10-15___ veces por rutina. Realice 2____ rutinas por sesin. Realice __5-9__ sesiones por da.  Axial Extension (Chin Tuck)    Pull chin in and lengthen back of neck. Hold _3___ seconds while counting out loud. Repeat _10___ times. Do __1-2__ sessions per day.    Access Code: [email or web address]  URL: [email or web address]  Date: 09/17/2018  Prepared by: Misty Stanley   Exercises Sidestepping in Squat with Resistance and Arms Forward - 10 reps - 2 sets - 1x daily - 6x weekly Supine Lower Trunk Rotation - 10 reps - 1 sets - 1x daily - 7x weekly Tandem Stance with Head Rotation on Foam  Pad - 10 reps - 3 sets - 1x daily - 7x weekly Single Leg Stance on Foam Pad - 10 reps - 1 sets - 10 second hold - 1x daily - 6x weekly    Removed open chain hip ABD with theraband, hip stretches and tandem gait due to no longer appropriate or needed.  Upgraded balance exercises to include head turns with SLS and tandem stance with eyes closed.    PT Education - 09/17/18 1350    Education Details  final HEP, D/C today    Person(s) Educated  Patient    Methods  Explanation;Demonstration;Handout    Comprehension  Verbalized understanding;Returned demonstration       PT Short Term Goals - 08/19/18 1453      PT SHORT TERM GOAL #1   Title  = LTG         PT Long Term Goals - 09/17/18 1351      PT LONG TERM GOAL #1   Title  The patient will be independent with progression of HEP for discharge.    Baseline  08/18/18: met with current HEP    Time  4    Period  Weeks    Status  Achieved      PT LONG TERM GOAL #2   Title  The patient will demonstrate return to climbing an A frame ladder with use of RUE and independent ability to balance, step up with R LE for return to household and work activities.    Baseline  09/09/2018:  performs in clinic with supervision, has not yet done at home    Time  4    Period  Weeks    Status  Partially Met      PT LONG TERM GOAL #3   Title  Pt will demonstrate improvement in overall function as indicated by FOTO score of 77    Baseline  67% functional intake; 57% 09/09/2018    Time  4    Period  Weeks    Status  Not Met      PT LONG TERM GOAL #4   Title  The patient will be able to return to yard work activities (discusses wanting to mow the yard-- fairly level surface, however this may depend more on R shoulder pain than balance/gait).      Baseline  08/18/18: has started with mowing, however needs help to start mower and only able push with left UE. Only mowed for 10-15 minute increments so not to fatigue.     Time  4    Period  Weeks    Status  Partially Met      PT LONG TERM GOAL #5   Title  The patient will be able to walk while carrying items (grocery bags, household items, etc).      Baseline  08/18/18: has been some, ~20-30% return to pre stroke activity    Time  4    Period  Weeks    Status  Partially Met            Plan - 09/17/18 1351    Clinical Impression Statement  Pt reporting significant improvement in UE ROM and pain following injection and new medication from physician.  Due to improvement in pain no dry needling performed today; instead reviewed final HEP to prepare for pt being out of the country x 2 months.  HEP revised and condensed to most important exercises to perform for  LE strengthening and balance.  Pt return demonstrated  all exercises.  Pt has made excellent progress and demonstrates significant improvement in LE strength, gait and balance.  Pt has met 1 LTG and partially met remaining LTG with greatest barrier to progress being UE pain which would improve during a therapy session but did not carry over session to session.  Pt agreeable to D/C today but would like to return when he returns to the [place] and continue with therapy if needed.    Personal Factors and Comorbidities  Comorbidity 2;Past/Current Experience    Comorbidities  hyperlipidemia, DM    Examination-Activity Limitations  Carry;Stairs;Locomotion Level;Transfers    Examination-Participation Restrictions  Community Activity;Yard Work;Other    Stability/Clinical Decision Making  Evolving/Moderate complexity    Rehab Potential  Good    PT Frequency  2x / week    PT Duration  8 weeks   anticipate D/C in 4   PT Treatment/Interventions  ADLs/Self Care Home Management;Electrical Stimulation;DME Instruction;Gait training;Stair training;Functional mobility training;Therapeutic activities;Therapeutic exercise;Orthotic Fit/Training;Patient/family education;Neuromuscular re-education;Balance training;Manual techniques;Spinal Manipulations;Aquatic Therapy;Cryotherapy;Moist Heat;Passive range of motion;Dry needling    Consulted and Agree with Plan of Care  Patient       Patient will benefit from skilled therapeutic intervention in order to improve the following deficits and impairments:  Abnormal gait, Decreased balance, Decreased mobility, Difficulty walking, Decreased strength, Impaired flexibility, Impaired tone, Postural dysfunction, Pain, Increased muscle spasms, Decreased range of motion  Visit Diagnosis: 1. Muscle weakness (generalized)   2. Hemiplegia and hemiparesis following cerebral infarction affecting right dominant side ([place])   3. Abnormal posture   4. Other abnormalities of gait and  mobility   5. Unsteadiness on feet        Problem List Patient Active Problem List   Diagnosis Date Noted  . 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 Left pontine cerebrovascular accident ([place]) 05/11/2018  . Tobacco abuse   . Uncontrolled type 2 diabetes mellitus with hyperglycemia ([place])   . CVA (cerebral vascular accident) ([place]) 05/06/2018  . Hyperlipidemia 05/30/2017    PHYSICAL THERAPY DISCHARGE SUMMARY  Visits from Start of Care: 28  Current functional level related to goals / functional outcomes: See impression statement and LTG achievement above   Remaining deficits: UE pain, weakness, decreased ROM   Education / Equipment: HEP  Plan: Patient agrees to discharge.  Patient goals were partially met. Patient is being discharged due to being pleased with the current functional level.  ?????    And Travel to [place] x 2 months.  Pt to seek new referral upon return if needed.  Rico Junker, PT, DPT 09/17/18    1:59 PM   [facility] [address] [place], [place], [address] Phone: [phone]   Fax:  [phone]  Name: Arthur Howard MRN: [id number] Date of Birth: 09/05/1965

## 2018-09-17 NOTE — Telephone Encounter (Signed)
They are closer to [facility] outpt so I have resent it to [facility] oupt pharmacy.

## 2018-09-20 ENCOUNTER — Telehealth: Payer: Self-pay | Admitting: *Deleted

## 2018-09-20 LAB — TOXASSURE SELECT,+ANTIDEPR,UR

## 2018-09-20 NOTE — Telephone Encounter (Signed)
Urine drug screen for this encounter is consistent for prescribed medication 

## 2018-09-21 ENCOUNTER — Other Ambulatory Visit: Payer: Self-pay

## 2018-09-21 ENCOUNTER — Ambulatory Visit: Payer: Commercial Indemnity | Admitting: Occupational Therapy

## 2018-09-21 DIAGNOSIS — R293 Abnormal posture: Secondary | ICD-10-CM

## 2018-09-21 DIAGNOSIS — M79641 Pain in right hand: Secondary | ICD-10-CM

## 2018-09-21 DIAGNOSIS — M6281 Muscle weakness (generalized): Secondary | ICD-10-CM

## 2018-09-21 DIAGNOSIS — I69351 Hemiplegia and hemiparesis following cerebral infarction affecting right dominant side: Secondary | ICD-10-CM

## 2018-09-21 DIAGNOSIS — R278 Other lack of coordination: Secondary | ICD-10-CM

## 2018-09-21 DIAGNOSIS — M25511 Pain in right shoulder: Secondary | ICD-10-CM

## 2018-09-21 DIAGNOSIS — R2689 Other abnormalities of gait and mobility: Secondary | ICD-10-CM

## 2018-09-21 NOTE — Therapy (Signed)
[facility] [address] [place], [place], [address] Phone: [phone]   Fax:  [phone]  Occupational Therapy Treatment  Patient Details  Name: Arthur Howard MRN: [id number] Date of Birth: 02/08/1966 No data recorded  Encounter Date: 09/21/2018  OT End of Session - 09/21/18 1137    Visit Number  29    Number of Visits  33    Authorization Type  Cigna    Authorization Time Period  12 weeks, 60 visit limit, counts as 1 visit if PT/OTsame day    Authorization - Visit Number  29    Authorization - Number of Visits  60    OT Start Time  1003    OT Stop Time  1045    OT Time Calculation (min)  42 min    Activity Tolerance  Patient limited by pain    Behavior During Therapy  WFL for tasks assessed/performed       Past Medical History:  Diagnosis Date  . Diabetes mellitus without complication ([place])   . Hyperlipidemia     Past Surgical History:  Procedure Laterality Date  . ANKLE SURGERY      There were no vitals filed for this visit.  Subjective Assessment - 09/21/18 1134    Subjective   Pt reports continued mild  shoulder and hand pain,    Currently in Pain?  Yes    Pain Score  3     Pain Location  Arm   hand   Pain Orientation  Right    Pain Descriptors / Indicators  Aching    Pain Type  Chronic pain    Pain Onset  More than a month ago    Aggravating Factors   malpositioning    Pain Relieving Factors  repositioning, stretching                 CLINIC OPERATION CHANGES: Outpatient Neuro Rehab is open at lower capacity following universal masking, social distancing, and patient screening.  The patient's COVID risk of complications score is 3  Supine upper trunk rotation and abduction of bilateral UE, for pect stetch closed chain shoulder flexion, chest press and  in supine with min facilitation/ v.c for shoulder/ scapular positioning Wall slides, wall push ups x 10 reps min v.c,  UE  ranger for shoulder flexion mid-high range and circumduction, abduction and external rotation min facilitation/ v.c Therapist finished checking progress towards goals as pt will be leaving for [place] the end of this  week. See goals for progress.  Pt/ dtr were instructed in updated HEP, pt returned demonstration Arm bike x 5 mins level 1 for reciprocal movement              OT Education - 09/21/18 1136    Education Details  updated HEP    Person(s) Educated  Patient;Child(ren)    Methods  Explanation;Demonstration;Verbal cues;Handout    Comprehension  Verbalized understanding;Returned demonstration;Verbal cues required       OT Short Term Goals - 09/21/18 1024      OT SHORT TERM GOAL #1   Title  I with inital HEP.    Time  6    Period  Weeks    Status  Achieved    Target Date  07/11/18      OT SHORT TERM GOAL #2   Title  Pt will demonstrate  improved RUE functional use as evidenced by increasing box/ blocks score by 8 blocks.    Baseline  RUE 48, LUE 75    Time  6    Period  Weeks    Status  Achieved   64 blocks 09/17/18-60 blocks     OT SHORT TERM GOAL #3   Title  Pt will demonstrate ability to retrieve a light weight object from overhead shelf at 120 shoulder flexion with no more than min compensations.    Time  6    Period  Weeks    Status  Not Met   105-110, mod compensation     OT SHORT TERM GOAL #4   Title  Pt will perfrom all basic ADLS modified independently.    Time  6    Period  Weeks    Status  Achieved   07/13/18     OT SHORT TERM GOAL #5   Title  Pt will increase RUE grip strength to 35 lbs of greater for increased functional use.    Time  6    Period  Weeks    Status  Not Met   20,21 lbs     OT SHORT TERM GOAL #6   Title  Pt will report using his RUE to assist with ADLs at least 50% of the time or greater.    Time  12    Period  Weeks    Status  Achieved   07/13/18: met per pt report       OT Long Term Goals - 09/21/18 1027       OT LONG TERM GOAL #1   Title  Pt will demonstrate ability to retrieve a 3 lbs weight from overhead shelf demonstrating good control with RUE.    Status  Not Met   unable due to decreased RUE control     OT LONG TERM GOAL #2   Title  Pt will perform mod complex home managment modified independently.    Status  Partially Met   Pt has performed some home management however he is not back to prior level of function     OT LONG TERM GOAL #3   Title  Pt will demonstrate improved RUE fine motor coordination for ADLS as evidenced by completing 9 hole peg test in 38 secs or less.    Time  12    Period  Weeks    Status  Achieved   29.65 secs     OT LONG TERM GOAL #4   Title  Pt will report that he has returned to using his RUE as dominant hand at least 75% of the time for ADLs/IADLs..    Status  Not Met   uses RUE as a non dominant assist 50% of the time     OT LONG TERM GOAL #5   Title  Pt will demonstrate ability to write his name and address legibily using RUE.    Status  Achieved      OT LONG TERM GOAL #6   Title  Pt will perform simulated work activities modified indpendently demonstrating good safety awareness.    Status  Deferred   due to pain and RUE weakness           Plan - 09/21/18 1138    Clinical Impression Statement  Pt demonstrates good overall progress. He did not fully achieve all goals due to pain. Pt is d/cing from therapy as he is going to [place] for 2 months.    OT Occupational Profile and History  Detailed Assessment- Review of Records and additional review of physical, cognitive,  psychosocial history related to current functional performance    Occupational performance deficits (Please refer to evaluation for details):  ADL's;IADL's;Work;Leisure;Social Participation    Body Structure / Function / Physical Skills  ADL;Dexterity;Flexibility;ROM;Strength;IADL;FMC;Coordination;Balance;UE functional use;GMC;Gait;Endurance;Decreased knowledge of precautions;Sensation     Rehab Potential  Good    OT Frequency  2x / week    OT Duration  8 weeks    OT Treatment/Interventions  Self-care/ADL training;Cryotherapy;Paraffin;Therapeutic exercise;DME and/or AE instruction;Functional Mobility Training;Balance training;Splinting;Manual Therapy;Neuromuscular education;Fluidtherapy;Ultrasound;Aquatic Therapy;Electrical Stimulation;Moist Heat;Contrast Bath;Energy conservation;Passive range of motion;Therapeutic activities;Patient/family education    Plan  d/c OT    Consulted and Agree with Plan of Care  Patient    Family Member Consulted  dtr present       Patient will benefit from skilled therapeutic intervention in order to improve the following deficits and impairments:   Body Structure / Function / Physical Skills: ADL, Dexterity, Flexibility, ROM, Strength, IADL, FMC, Coordination, Balance, UE functional use, GMC, Gait, Endurance, Decreased knowledge of precautions, Sensation     OCCUPATIONAL THERAPY DISCHARGE SUMMARY   Current functional level related to goals / functional outcomes: Pt made good overall progress however he did not fully achieve goals due to pain.   Remaining deficits: Muscle weakness, pain, decreased coordination, decreased ROM   Education / Equipment: Pt/ dtr were educated in updated HEP. Pt demonstrates understanding.Pt was made aware that he will need a new referral to return to therapy after he returns from [place].  Plan: Patient agrees to discharge.  Patient goals were partially met. Patient is being discharged due to the patient's request.  ?????       Visit Diagnosis: 1. Muscle weakness (generalized)   2. Hemiplegia and hemiparesis following cerebral infarction affecting right dominant side ([place])   3. Abnormal posture   4. Other lack of coordination   5. Pain in right hand   6. Acute pain of right shoulder   7. Other abnormalities of gait and mobility       Problem List Patient Active Problem List   Diagnosis Date Noted   . 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 ([place]) 08/02/2018  . Left pontine cerebrovascular accident ([place]) 05/11/2018  . Tobacco abuse   . Uncontrolled type 2 diabetes mellitus with hyperglycemia ([place])   . CVA (cerebral vascular accident) ([place]) 05/06/2018  . Hyperlipidemia 05/30/2017    Trong Gosling 09/21/2018, 11:40 AM Theone Murdoch, OTR/L [phone] Phone: [phone] 11:45 AM 09/21/18 [facility] [address] [place], [place], [address] Phone: [phone]   Fax:  [phone]  Name: Arthur Howard MRN: [id number] Date of Birth: 1965/08/31

## 2018-09-21 NOTE — Patient Instructions (Addendum)
Shoulder Push-Up (Prone on Elbows)    Con los codos apoyados debajo de los hombros, levantase sobre los codos tan alto como le sea posible. Boyd caderas en contacto con la superficie y la espalda arqueada. Sostenga __5_ segundos. Repita 10___ veces. Realice __[id number] Squeeze: Off Edge - Prone    Eleve el codo, llevando el omoplato hacia la columna. Realice _43__ veces, _2__ veces por da.    http://ss.exer.us/300   Copyright  VHI. All rights reserved.  _ sesiones por Training and development officer.  Copyright  VHI. All rights reserved.  Wall Push-Up: Double Arm    De pie ___ pies de la pared con ambas manos sobre la misma. Realice una flexin de pecho. Repita _10__ veces por rutina. Descanse __5_ segundos despus de cada rutina. Realice _7__ rutinas por sesin.  http://plyo.exer.us/182   Copyright  VHI. All rights reserved. SHOULDER: Flexion At Wall    Slide both arms up wall while leaning gently into wall. Maintain upright posture and tuck in stomach. Hold _5__ seconds. 10___ reps per set, __2_ sets per day, __5_ days per week  Copyright  VHI. All rights reserved.

## 2018-09-22 ENCOUNTER — Telehealth: Payer: Self-pay | Admitting: Internal Medicine

## 2018-09-22 DIAGNOSIS — E7849 Other hyperlipidemia: Secondary | ICD-10-CM

## 2018-09-22 DIAGNOSIS — E1165 Type 2 diabetes mellitus with hyperglycemia: Secondary | ICD-10-CM

## 2018-09-22 MED ORDER — ATORVASTATIN CALCIUM 80 MG PO TABS: 80.0000 mg | ORAL_TABLET | Freq: Every day | ORAL | 0 refills | Status: DC

## 2018-09-22 MED ORDER — GLIPIZIDE 10 MG PO TABS: 10.0000 mg | ORAL_TABLET | Freq: Two times a day (BID) | ORAL | 0 refills | Status: DC

## 2018-09-22 MED FILL — ATORVASTATIN 80 MG TABLET: 80 | 90 days supply | Qty: 90 | Fill #0

## 2018-09-22 MED FILL — glipiZIDE 10 MG TABS: 10 | 90 days supply | Qty: 180 | Fill #0

## 2018-09-22 NOTE — Telephone Encounter (Signed)
Medication Refill - Medication: atorvastatin (LIPITOR) 80 MG tablet,   glipiZIDE (GLUCOTROL) 10 MG tablet   Pt will be out of town for 2 months starting Friday 09/24/2018.  Please refill as soon as possible.   Preferred Pharmacy:  [facility], [place] - [address]. [phone] (Phone) [phone] (Fax)     Pt was advised that RX refills may take up to 3 business days. We ask that you follow-up with your pharmacy.

## 2018-09-22 NOTE — Telephone Encounter (Signed)
Refills sent

## 2018-11-25 ENCOUNTER — Other Ambulatory Visit: Payer: Self-pay

## 2018-11-25 ENCOUNTER — Encounter: Payer: Commercial Indemnity | Attending: Registered Nurse | Admitting: Physical Medicine & Rehabilitation

## 2018-11-25 VITALS — BP 143/81 | HR 75 | Temp 97.5°F | Ht 64.0 in | Wt 153.0 lb

## 2018-11-25 DIAGNOSIS — M7501 Adhesive capsulitis of right shoulder: Secondary | ICD-10-CM

## 2018-11-25 DIAGNOSIS — E1165 Type 2 diabetes mellitus with hyperglycemia: Secondary | ICD-10-CM | POA: Insufficient documentation

## 2018-11-25 DIAGNOSIS — I6350 Cerebral infarction due to unspecified occlusion or stenosis of unspecified cerebral artery: Secondary | ICD-10-CM | POA: Diagnosis present

## 2018-11-25 DIAGNOSIS — E7849 Other hyperlipidemia: Secondary | ICD-10-CM | POA: Insufficient documentation

## 2018-11-25 NOTE — Progress Notes (Signed)
Shoulder injection Right glenohumeral  With  ultrasound guidance)  Indication:Right Shoulder pain not relieved by medication management and other conservative care.  Informed consent was obtained after describing risks and benefits of the procedure with the patient, this includes bleeding, bruising, infection and medication side effects. The patient wishes to proceed and has given written consent. Patient was placed in a seated position. The RIght shoulder was marked and prepped with betadine in the subacromial area. A 25-gauge 1-1/2 inch needle was inserted into the subacromial area. After negative draw back for blood, a solution containing 1 mL of 6 mg per ML betamethasone and 4 mL of 1%Marcaine was injected. A band aid was applied. The patient tolerated the procedure well. Post procedure instructions were given.

## 2018-12-21 ENCOUNTER — Encounter: Payer: Self-pay | Admitting: Internal Medicine

## 2018-12-21 ENCOUNTER — Other Ambulatory Visit: Payer: Self-pay

## 2018-12-21 ENCOUNTER — Ambulatory Visit (INDEPENDENT_AMBULATORY_CARE_PROVIDER_SITE_OTHER): Payer: Commercial Indemnity | Admitting: Internal Medicine

## 2018-12-21 VITALS — BP 110/68 | HR 68 | Temp 97.5°F | Wt 151.7 lb

## 2018-12-21 DIAGNOSIS — Z23 Encounter for immunization: Secondary | ICD-10-CM | POA: Diagnosis not present

## 2018-12-21 DIAGNOSIS — Z720 Tobacco use: Secondary | ICD-10-CM

## 2018-12-21 DIAGNOSIS — I639 Cerebral infarction, unspecified: Secondary | ICD-10-CM

## 2018-12-21 DIAGNOSIS — E7849 Other hyperlipidemia: Secondary | ICD-10-CM

## 2018-12-21 DIAGNOSIS — E1165 Type 2 diabetes mellitus with hyperglycemia: Secondary | ICD-10-CM

## 2018-12-21 DIAGNOSIS — I6350 Cerebral infarction due to unspecified occlusion or stenosis of unspecified cerebral artery: Secondary | ICD-10-CM | POA: Diagnosis not present

## 2018-12-21 DIAGNOSIS — I69351 Hemiplegia and hemiparesis following cerebral infarction affecting right dominant side: Secondary | ICD-10-CM

## 2018-12-21 LAB — POCT GLYCOSYLATED HEMOGLOBIN (HGB A1C): Hemoglobin A1C: 9.4 % — AB (ref 4.0–5.6)

## 2018-12-21 MED ORDER — METFORMIN HCL 1000 MG PO TABS: 1000.0000 mg | ORAL_TABLET | Freq: Two times a day (BID) | ORAL | 1 refills | Status: DC

## 2018-12-21 MED ORDER — ATORVASTATIN CALCIUM 80 MG PO TABS: 80.0000 mg | ORAL_TABLET | Freq: Every day | ORAL | 1 refills | Status: DC

## 2018-12-21 MED ORDER — GABAPENTIN 300 MG PO CAPS: 300.0000 mg | ORAL_CAPSULE | Freq: Two times a day (BID) | ORAL | 1 refills | Status: DC

## 2018-12-21 MED ORDER — GLIPIZIDE 10 MG PO TABS: 10.0000 mg | ORAL_TABLET | Freq: Two times a day (BID) | ORAL | 1 refills | Status: DC

## 2018-12-21 MED FILL — GABAPENTIN 300 MG CAPSULE: 300 | 90 days supply | Qty: 180 | Fill #0

## 2018-12-21 MED FILL — glipiZIDE 10 MG TABS: 10 | 90 days supply | Qty: 180 | Fill #0

## 2018-12-21 MED FILL — metFORMIN HCL 1000 MG TABS: 1000 | 90 days supply | Qty: 180 | Fill #0

## 2018-12-21 MED FILL — ATORVASTATIN 80 MG TABLET: 80 | 90 days supply | Qty: 90 | Fill #0

## 2018-12-21 NOTE — Progress Notes (Signed)
Established Patient Office Visit     CC/Reason for Visit: Follow-up chronic conditions  HPI: Arthur Howard is a 53 y.o. male who is coming in today for the above mentioned reasons. 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 Letta Pate with [facility].  He was a smoker but quit at the time of his CVA.  In regards to his diabetes he is now on glipizide 10 mg and metformin 500 mg twice a day.  His A1c on diagnosis in February was 11.3 and it is down to 9.4 today.  He has no acute complaints other than continued pain of his right arm and shoulder.  He has had several cortisone injections and was told that his sugars will fluctuate because of this.   Past Medical/Surgical History: Past Medical History:  Diagnosis Date  . Diabetes mellitus without complication ([place])   . Hyperlipidemia     Past Surgical History:  Procedure Laterality Date  . ANKLE SURGERY      Social History:  reports that he has quit smoking. He has never used smokeless tobacco. He reports that he does not drink alcohol or use drugs.  Allergies: No Known Allergies  Family History:  Family History  Problem Relation Age of Onset  . Heart disease Mother   . Lung cancer Father   . Diabetes Sister   . Diabetes Brother   . CVA Neg Hx      Current Outpatient Medications:  .  acetaminophen (TYLENOL) 325 MG tablet, Take 2 tablets (650 mg total) by mouth every 4 (four) hours as needed for mild pain (or temp > 37.5 C (99.5 F))., Disp: , Rfl:  .  aspirin EC 81 MG EC tablet, Take 1 tablet (81 mg total) by mouth daily., Disp: , Rfl:  .  atorvastatin (LIPITOR) 80 MG tablet, Take 1 tablet (80 mg total) by mouth daily., Disp: 90 tablet, Rfl: 1 .  blood glucose meter kit and supplies KIT, Dispense based on patient and insurance preference. Use up to  four times daily as directed. (FOR ICD-9 250.00, 250.01)., Disp: 1 each, Rfl: 0 .  diclofenac sodium (VOLTAREN) 1 % GEL, Apply 2 g topically 4 (four) times daily., Disp: 1 Tube, Rfl: 3 .  gabapentin (NEURONTIN) 300 MG capsule, Take 1 capsule (300 mg total) by mouth 2 (two) times daily., Disp: 180 capsule, Rfl: 1 .  glipiZIDE (GLUCOTROL) 10 MG tablet, Take 1 tablet (10 mg total) by mouth 2 (two) times daily before a meal., Disp: 180 tablet, Rfl: 1 .  metFORMIN (GLUCOPHAGE) 1000 MG tablet, Take 1 tablet (1,000 mg total) by mouth 2 (two) times daily with a meal., Disp: 180 tablet, Rfl: 1 .  tiZANidine (ZANAFLEX) 4 MG tablet, Take 1 tablet (4 mg total) by mouth at bedtime., Disp: 30 tablet, Rfl: 1 .  traMADol (ULTRAM) 50 MG tablet, Take 1 tablet (50 mg total) by mouth at bedtime., Disp: 30 tablet, Rfl: 1  Review of Systems:  Constitutional: Denies fever, chills, diaphoresis, appetite change and fatigue.  HEENT: Denies photophobia, eye pain, redness, hearing loss, ear pain, congestion, sore throat, rhinorrhea, sneezing, mouth sores, trouble swallowing and tinnitus.   Respiratory: Denies SOB, DOE, cough, chest tightness,  and wheezing.   Cardiovascular: Denies chest pain, palpitations and leg swelling.  Gastrointestinal: Denies nausea, vomiting, abdominal pain, diarrhea, constipation, blood in stool  and abdominal distention.  Genitourinary: Denies dysuria, urgency, frequency, hematuria, flank pain and difficulty urinating.  Endocrine: Denies: hot or cold intolerance, sweats, changes in hair or nails, polyuria, polydipsia. Musculoskeletal: Denies myalgias, back pain, joint swelling, arthralgias and gait problem.  Skin: Denies pallor, rash and wound.  Neurological: Denies dizziness, seizures, syncope, weakness, light-headedness, numbness and headaches.  Hematological: Denies adenopathy. Easy bruising, personal or family bleeding history  Psychiatric/Behavioral: Denies suicidal ideation, mood changes,  confusion, nervousness, sleep disturbance and agitation    Physical Exam: Vitals:   12/21/18 0809  BP: 110/68  Pulse: 68  Temp: (!) 97.5 F (36.4 C)  TempSrc: Temporal  SpO2: 97%  Weight: 151 lb 11.2 oz (68.8 kg)    Body mass index is 26.04 kg/m.   Constitutional: NAD, calm, comfortable Eyes: PERRL, lids and conjunctivae normal ENMT: Mucous membranes are moist.  Neck: normal, supple, no masses, no thyromegaly Respiratory: clear to auscultation bilaterally, no wheezing, no crackles. Normal respiratory effort. No accessory muscle use.  Cardiovascular: Regular rate and rhythm, no murmurs / rubs / gallops. No extremity edema. 2+ pedal pulses. No carotid bruits.  Abdomen: no tenderness, no masses palpated. No hepatosplenomegaly. Bowel sounds positive.  Musculoskeletal: no clubbing / cyanosis. No joint deformity upper and lower extremities. Good ROM, no contractures. Normal muscle tone.  Skin: no rashes, lesions, ulcers. No induration Neurologic: CN 2-12 grossly intact.  Right-sided hemiparesis Psychiatric: Normal judgment and insight. Alert and oriented x 3. Normal mood.    Impression and Plan:  Left pontine cerebrovascular accident [facility]) 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, unspecified hemiplegia type ([place]) -Most recent discharge from rehab, follows with PM and R routinely. -Refill gabapentin.  Uncontrolled type 2 diabetes mellitus with hyperglycemia (HCC) -A1c has improved from 11.3 on diagnosis to 9.4 today. -We have reviewed diet, he seems to be doing pretty well with decreased carbohydrate intake. -Will increase metformin from 500 twice daily to 1000 mg BID, continue glipizide 10 mg.  Follow-up in 3 months. -Suspect multiple cortisone injections are affecting his glucose control.  Other hyperlipidemia  -Last LDL was 133 in February, not at goal. -On high intensity statin, Lipitor 80 mg. -Check fasting lipids next visit (he is not  fasting today).  Tobacco abuse -He quit smoking after his stroke in February.    Patient Instructions  -Nice seeing you today!!  -INCREASE metformin to 1000 mg twice daily.  -RESTART gabapentin twice daily.  -Flu vaccine today.  -REFERRAL for ophthalmology placed (eye doctor).  -Schedule follow up in 3 months.     Lelon Frohlich, MD [facility]

## 2018-12-21 NOTE — Patient Instructions (Addendum)
-  Nice seeing you today!!  -INCREASE metformin to 1000 mg twice daily.  -RESTART gabapentin twice daily.  -Flu vaccine today.  -REFERRAL for ophthalmology placed (eye doctor).  -Schedule follow up in 3 months.

## 2018-12-31 ENCOUNTER — Telehealth: Payer: Self-pay | Admitting: Internal Medicine

## 2018-12-31 NOTE — Telephone Encounter (Signed)
Patient's daughter - Radwan Lone is calling regarding metFORMIN (GLUCOPHAGE) 1000 MG tablet [id number]  She heard there is a recall on the medication. Would like to be sure it is not the one her father is taking? Please advise [phone]- [phone]

## 2019-01-04 NOTE — Telephone Encounter (Signed)
ATC, unable to leave a voice mail. CRM created.  She will need to check with his pharmacy regarding this. We have no way of knowing what brand of medication he was given. Please have them contact the pharmacy.

## 2019-01-05 NOTE — Telephone Encounter (Signed)
Left detailed message on machine for patient to call his pharmacy.

## 2019-02-11 ENCOUNTER — Encounter: Payer: Self-pay | Admitting: Physical Medicine & Rehabilitation

## 2019-02-11 ENCOUNTER — Telehealth: Payer: Self-pay

## 2019-02-11 NOTE — Telephone Encounter (Signed)
Patients daughter called requesting a letter stating that patient is unable to return to work. Wants it emailed to [email or web address]

## 2019-02-22 NOTE — Progress Notes (Signed)
Virtual Visit via Video Note The purpose of this virtual visit is to provide medical care while limiting exposure to the novel coronavirus.    Consent was obtained for video visit:  Yes.   Answered questions that patient had about telehealth interaction:  Yes.   I discussed the limitations, risks, security and privacy concerns of performing an evaluation and management service by telemedicine. I also discussed with the patient that there may be a patient responsible charge related to this service. The patient expressed understanding and agreed to proceed.  Pt location: Home Physician Location: office Name of referring provider:  Isaac Bliss, [place] [facility]* I connected with Arthur Howard at patients initiation/request on 02/24/2019 at  8:50 AM EST by video enabled telemedicine application and verified that I am speaking with the correct person using two identifiers. Pt MRN:  [id number] Pt DOB:  [birth year] Video Participants:  Arthur Howard; his daughter   History of Present Illness:  [name] is a 53 year old right-handed man with type 2 diabetes and hyperlipidemia who follows up for stroke.    UPDATE: Current medications:  ASA 33m daily; atorvastatin 829mdaily; metformin; glipizide; gabapentin 30019mwice daily; tizanidine 4mg78m bedtime (ran out); tramadol  Hgb A1c from 12/21/2018 was 9.4 (down from 11.3 nine months ago).  He finished occupational therapy a few months ago.  He still performs home exercises daily.  He still has pain from the right occipital region radiating down the rside of his neck and down in the arm He is able to use his right arm and hand, but it is painful.  He can dress himself but has trouble with any activity that involves raising the arm will increase pain.    HISTORY: [facility] Howard [facility] 05/11/18 for stroke after waking up in the morning with some slurred speech and right upper and lower extremity weakness  and numbness.  CT of head demonstrated small age indeterminate infarct involving the posterior limb of the right internal capsule (which subsequently was shown to be chronic on MRI).  CTA of head ane neck revealed no large vessel occlusion or stenosis.  MRI of brain demonstrated small acute left inferior ventral pontine infarct as well as chronic small vessel ischemic changges and old right basal ganglia lacunar infarct.  2D echo showed EF 6-65% with no source of embolus.  LDL was 133.  Hgb A1c was 11.3.  Telemetry revealed no arrhythmia.  He was started on ASA 81mg53m Plavix 75mg 23msecondary stroke prevention.  His Lipitor was increased from 40mg t61mmg da63m  Diabetes management was optimized.  He was discharged to inpatient rehab and subsequently discharged home with outpatient rehab.  He is now off Plavix but remains on ASA.  He still participates in outpatient physical therapy.  He has been experiencing right arm pain.  He also notes tingling in the right posterior aspect of his head.     Past Medical History: Past Medical History:  Diagnosis Date  . Diabetes mellitus without complication (HCC)   .[place]     Medications: Outpatient Encounter Medications as of 02/24/2019  Medication Sig  . acetaminophen (TYLENOL) 325 MG tablet Take 2 tablets (650 mg total) by mouth every 4 (four) hours as needed for mild pain (or temp > 37.5 C (99.5 F)).  . aspiriMarland Kitchen EC 81 MG EC tablet Take 1 tablet (81 mg total) by mouth daily.  . atorvaMarland Kitchentatin (LIPITOR) 80 MG tablet  Take 1 tablet (80 mg total) by mouth daily.  . blood glucose meter kit and supplies KIT Dispense based on patient and insurance preference. Use up to four times daily as directed. (FOR ICD-9 250.00, 250.01).  Marland Kitchen diclofenac sodium (VOLTAREN) 1 % GEL Apply 2 g topically 4 (four) times daily.  Marland Kitchen gabapentin (NEURONTIN) 300 MG capsule Take 1 capsule (300 mg total) by mouth 2 (two) times daily.  Marland Kitchen glipiZIDE (GLUCOTROL) 10 MG tablet Take 1  tablet (10 mg total) by mouth 2 (two) times daily before a meal.  . metFORMIN (GLUCOPHAGE) 1000 MG tablet Take 1 tablet (1,000 mg total) by mouth 2 (two) times daily with a meal.  . tiZANidine (ZANAFLEX) 4 MG tablet Take 1 tablet (4 mg total) by mouth at bedtime.  . traMADol (ULTRAM) 50 MG tablet Take 1 tablet (50 mg total) by mouth at bedtime.   No facility-administered encounter medications on file as of 02/24/2019.     Allergies: No Known Allergies  Family History: Family History  Problem Relation Age of Onset  . Heart disease Mother   . Lung cancer Father   . Diabetes Sister   . Diabetes Brother   . CVA Neg Hx     Social History: Social History   Socioeconomic History  . Marital status: Married    Spouse name: Verdis Frederickson  . Number of children: 7  . Years of education: Not on file  . Highest education level: 6th grade  Occupational History  . Occupation: Sales executive    Comment: [employer]  Social Needs  . Financial resource strain: Not on file  . Food insecurity    Worry: Not on file    Inability: Not on file  . Transportation needs    Medical: Not on file    Non-medical: Not on file  Tobacco Use  . Smoking status: Former Research scientist (life sciences)  . Smokeless tobacco: Never Used  . Tobacco comment: never a daily smoker  Substance and Sexual Activity  . Alcohol use: No    Alcohol/week: 0.0 standard drinks  . Drug use: No  . Sexual activity: Not on file  Lifestyle  . Physical activity    Days per week: Not on file    Minutes per session: Not on file  . Stress: Not on file  Relationships  . Social Herbalist on phone: Not on file    Gets together: Not on file    Attends religious service: Not on file    Active member of club or organization: Not on file    Attends meetings of clubs or organizations: Not on file    Relationship status: Not on file  . Intimate partner violence    Fear of current or ex partner: Not on file    Emotionally abused: Not on file    Physically  abused: Not on file    Forced sexual activity: Not on file  Other Topics Concern  . Not on file  Social History Narrative   Works with Sales executive. Lives in [place]. Moved from [place] 35 years ago. He is married. Has 7 children (all girls).       Patient is right-handed. He lives with his wife in a one level home. He walks daily for exercise.    Observations/Objective:   Height _0  (1.626 m), weight 154 lb (69.9 kg). No acute distress.  Alert and oriented.  Speech fluent and not dysarthric.  Language intact.  Eyes orthophoric on primary gaze.  Face symmetric.  Assessment and Plan:   1.  Right-sided hemiplegia as late effect of small left pontine infarct secondary to small vessel disease. 2.  Right sided neck and arm pain secondary to stroke.  Still affecting function despite OT.  3.  Type 2 diabetes mellitus 4.  Hyperlipidemia  For post-stroke central pain and myofascial pain: 1.  Increase gabapentin to 3107m three times daily 2.  Refill tizanidine 48mat bedtime 3.  I will refer to physical medicine and rehab to see if there are other therapeutic options that they can offer.  Secondary stroke prevention management as per PCP: 1.  ASA 811maily for secondary stroke prevention.   2.  Lipitor 24m81mily (LDL goal less than 70) 3.  Blood pressure management (goal less than 130/90) 4.  Glycemic control (Hgb A1c goal less than 7) 5.  Mediterranean diet 6.  Routine exercise 7.  Follow up in 6 months.  Follow Up Instructions:    -I discussed the assessment and treatment plan with the patient. The patient was provided an opportunity to ask questions and all were answered. The patient agreed with the plan and demonstrated an understanding of the instructions.   The patient was advised to call back or seek an in-person evaluation if the symptoms worsen or if the condition fails to improve as anticipated.    Total Time spent in visit with the patient was:  15 minutes   Arthur Howard Major

## 2019-02-24 ENCOUNTER — Telehealth (INDEPENDENT_AMBULATORY_CARE_PROVIDER_SITE_OTHER): Payer: Commercial Indemnity | Admitting: Neurology

## 2019-02-24 ENCOUNTER — Other Ambulatory Visit: Payer: Self-pay

## 2019-02-24 ENCOUNTER — Encounter: Payer: Self-pay | Admitting: Neurology

## 2019-02-24 ENCOUNTER — Encounter: Payer: Commercial Indemnity | Admitting: Physical Medicine & Rehabilitation

## 2019-02-24 VITALS — Ht 64.0 in | Wt 154.0 lb

## 2019-02-24 DIAGNOSIS — M79601 Pain in right arm: Secondary | ICD-10-CM

## 2019-02-24 DIAGNOSIS — I6350 Cerebral infarction due to unspecified occlusion or stenosis of unspecified cerebral artery: Secondary | ICD-10-CM

## 2019-02-24 DIAGNOSIS — I69351 Hemiplegia and hemiparesis following cerebral infarction affecting right dominant side: Secondary | ICD-10-CM

## 2019-02-24 DIAGNOSIS — E7849 Other hyperlipidemia: Secondary | ICD-10-CM

## 2019-02-24 DIAGNOSIS — E1165 Type 2 diabetes mellitus with hyperglycemia: Secondary | ICD-10-CM

## 2019-02-24 DIAGNOSIS — M542 Cervicalgia: Secondary | ICD-10-CM

## 2019-02-24 DIAGNOSIS — I639 Cerebral infarction, unspecified: Secondary | ICD-10-CM

## 2019-02-24 MED ORDER — GABAPENTIN 300 MG PO CAPS: 300.0000 mg | ORAL_CAPSULE | Freq: Three times a day (TID) | ORAL | 5 refills | Status: DC

## 2019-02-24 MED ORDER — TIZANIDINE HCL 4 MG PO TABS: 4.0000 mg | ORAL_TABLET | Freq: Every day | ORAL | 5 refills | Status: DC

## 2019-02-24 MED FILL — tiZANidine HCL 4 MG TABS: 4 | 30 days supply | Qty: 30 | Fill #0

## 2019-02-24 NOTE — Addendum Note (Signed)
Addended by: Ranae Plumber on: 02/24/2019 10:04 AM   Modules accepted: Orders

## 2019-03-03 ENCOUNTER — Other Ambulatory Visit: Payer: Self-pay

## 2019-03-03 ENCOUNTER — Encounter: Payer: Self-pay | Admitting: Physical Medicine & Rehabilitation

## 2019-03-03 ENCOUNTER — Encounter: Payer: Commercial Indemnity | Attending: Registered Nurse | Admitting: Physical Medicine & Rehabilitation

## 2019-03-03 VITALS — BP 130/77 | HR 69 | Temp 97.8°F | Ht 62.0 in | Wt 151.0 lb

## 2019-03-03 DIAGNOSIS — E7849 Other hyperlipidemia: Secondary | ICD-10-CM | POA: Diagnosis present

## 2019-03-03 DIAGNOSIS — M7501 Adhesive capsulitis of right shoulder: Secondary | ICD-10-CM | POA: Diagnosis not present

## 2019-03-03 DIAGNOSIS — I6350 Cerebral infarction due to unspecified occlusion or stenosis of unspecified cerebral artery: Secondary | ICD-10-CM | POA: Diagnosis present

## 2019-03-03 DIAGNOSIS — E1165 Type 2 diabetes mellitus with hyperglycemia: Secondary | ICD-10-CM | POA: Insufficient documentation

## 2019-03-03 MED ORDER — TRAMADOL HCL 50 MG PO TABS: 50.0000 mg | ORAL_TABLET | Freq: Every day | ORAL | 1 refills | Status: DC

## 2019-03-03 MED FILL — traMADol HCL 50 MG TABS: 50 | 30 days supply | Qty: 30 | Fill #0

## 2019-03-03 NOTE — Patient Instructions (Signed)
Complex Regional Pain Syndrome Complex regional pain syndrome (CRPS) is a nerve disorder that causes long-term (chronic) pain. This is usually in a hand, arm, foot, or leg. CRPS usually occurs after an injury or trauma, such as a fracture or sprain. There are two types of CRPS:  Type 1. This type occurs after an injury with no known damage to a nerve.  Type 2. This type occurs after an injury that damages a nerve. There are three stages of the condition:  Stage 1. This stage, called the acute stage, may last for up to 3 months.  Stage 2. This stage, called the dystrophic stage, may last for 3-12 months.  Stage 3. This stage, called the atrophic stage, may start after one year. CRPS ranges from mild to severe. For most people, CRPS is mild and recovery happens over time. For others, CRPS lasts for a very long time and makes everyday tasks hard to do. What are the causes? The exact cause of this condition is not known. It is usually triggered by an injury. What increases the risk? You are more likely to develop this condition if:  You are male.  You have any of the following injuries: ? A wrist fracture that involves a lower arm bone (distal radius fracture). ? Ankle dislocation or fracture. ? A long surgery time. ? Possible nerve injury during surgery. What are the signs or symptoms? Signs and symptoms in the affected hand, arm, foot, or leg are different for each stage. Signs and symptoms of stage 1 include:  Burning pain.  A prickling, tingling feeling (pins and needles sensation).  Extremely sensitive skin.  Swelling.  Joint stiffness.  Warmth and redness.  Excessive sweating.  Hair and nail growth that is faster than normal. Signs and symptoms of stage 2 include:  Spreading of pain to the whole arm or leg.  Increased skin sensitivity.  Increased swelling and stiffness.  Coolness of the skin.  Blue discoloration of skin.  Loss of skin wrinkles.  Brittle  fingernails. Signs and symptoms of stage 3 include:  Pain that spreads to other areas of the body but becomes less severe.  More stiffness, leading to loss of motion.  Skin that is pale, dry, shiny, or tightly stretched. How is this diagnosed? This condition may be diagnosed based on:  Your signs and symptoms.  A physical exam. There is no test to diagnose CRPS, but you may have tests:  To check for bone changes that might indicate CRPS. These tests may include an MRI or bone scan.  To rule out other possible causes of your symptoms. How is this treated? Early treatment may prevent CRPS from advancing past stage 1. There is not one treatment that works for everyone. Treatment options may include:  Medicines, which may include: ? NSAIDs, such as ibuprofen. ? Steroids. ? Blood pressure drugs. ? Antidepressants. ? Anti-seizure drugs. ? Pain relievers.  Exercise.  Occupational therapy (OT) and physical therapy (PT).  Biofeedback.  Mental health counseling.  Numbing injections.  Spinal surgery to implant a spinal cord stimulator or a pain pump. Follow these instructions at home: Medicines  Take over-the-counter and prescription medicines only as told by your health care provider.  Do not drive or use heavy machinery while taking prescription pain medicine.  If you are taking prescription pain medicine, take actions to prevent or treat constipation. Your health care provider may recommend that you: ? Drink enough fluid to keep your urine pale yellow. ? Eat foods that   are high in fiber, such as fresh fruits and vegetables, whole grains, and beans. ? Limit foods that are high in fat and processed sugars, such as fried or sweet foods. ? Take an over-the-counter or prescription medicine for constipation. General instructions  Do not use any products that contain nicotine or tobacco, such as cigarettes and e-cigarettes. If you need help quitting, ask your health care  provider.  Maintain a healthy weight.  Return to your normal activities as told by your health care provider. Ask your health care provider what activities are safe for you.  Follow an exercise program as directed by your health care provider.  Keep all follow-up visits as told by your health care provider. This is important. Contact a health care provider if:  Your symptoms change.  Your symptoms get worse.  You develop anxiety or depression. Summary  Complex regional pain syndrome (CRPS) is a nerve disorder that causes long-term (chronic) pain, usually in a hand, arm, leg, or foot.  CRPS usually occurs after an injury or trauma, such as a fracture or sprain.  CRPS ranges from mild to severe. Early treatment may prevent CRPS from advancing to more severe stages. This information is not intended to replace advice given to you by your health care provider. Make sure you discuss any questions you have with your health care provider. Document Released: 02/28/2002 Document Revised: 02/20/2017 Document Reviewed: 02/02/2017 Elsevier Patient Education  2020 [employer].

## 2019-03-03 NOTE — Progress Notes (Signed)
Shoulder injection Right glenohumeral  With  ultrasound guidance)  Indication:Right Shoulder pain not relieved by medication management and other conservative care.  Informed consent was obtained after describing risks and benefits of the procedure with the patient, this includes bleeding, bruising, infection and medication side effects. The patient wishes to proceed and has given written consent. Patient was placed in a seated position. The RIght shoulder was marked and prepped with betadine in the subacromial area. A 25-gauge 1-1/2 inch needle was inserted into the subacromial area. After negative draw back for blood, a solution containing 1 mL of 6 mg per ML betamethasone and 4 mL of 1%Marcaine was injected. A band aid was applied. The patient tolerated the procedure well. Post procedure instructions were given.  If this injection only gives short-term or incomplete relief of his symptoms, may need to consider other potential treatments including stellate ganglion block versus suprascapular nerve block In the meantime we will continue tramadol 50 mg 1 or 2/day as needed

## 2019-03-08 MED FILL — ATORVASTATIN 80 MG TABLET: 80 | 90 days supply | Qty: 90 | Fill #0

## 2019-03-22 ENCOUNTER — Other Ambulatory Visit: Payer: Self-pay

## 2019-03-22 ENCOUNTER — Encounter: Payer: Self-pay | Admitting: Internal Medicine

## 2019-03-22 ENCOUNTER — Ambulatory Visit (INDEPENDENT_AMBULATORY_CARE_PROVIDER_SITE_OTHER): Payer: Commercial Indemnity | Admitting: Internal Medicine

## 2019-03-22 VITALS — BP 110/70 | HR 62 | Temp 97.3°F | Wt 153.3 lb

## 2019-03-22 DIAGNOSIS — I639 Cerebral infarction, unspecified: Secondary | ICD-10-CM

## 2019-03-22 DIAGNOSIS — Z720 Tobacco use: Secondary | ICD-10-CM

## 2019-03-22 DIAGNOSIS — I6350 Cerebral infarction due to unspecified occlusion or stenosis of unspecified cerebral artery: Secondary | ICD-10-CM

## 2019-03-22 DIAGNOSIS — I69351 Hemiplegia and hemiparesis following cerebral infarction affecting right dominant side: Secondary | ICD-10-CM | POA: Diagnosis not present

## 2019-03-22 DIAGNOSIS — E7849 Other hyperlipidemia: Secondary | ICD-10-CM | POA: Diagnosis not present

## 2019-03-22 DIAGNOSIS — E1142 Type 2 diabetes mellitus with diabetic polyneuropathy: Secondary | ICD-10-CM

## 2019-03-22 DIAGNOSIS — E1165 Type 2 diabetes mellitus with hyperglycemia: Secondary | ICD-10-CM | POA: Diagnosis not present

## 2019-03-22 DIAGNOSIS — Z1211 Encounter for screening for malignant neoplasm of colon: Secondary | ICD-10-CM

## 2019-03-22 LAB — LIPID PANEL
Cholesterol: 183 mg/dL (ref 0–200)
HDL: 41.6 mg/dL (ref >39.00)
LDL Cholesterol: 105 mg/dL — ABNORMAL HIGH (ref 0–99)
NonHDL: 141.56
Total CHOL/HDL Ratio: 4
Triglycerides: 181 mg/dL — ABNORMAL HIGH (ref 0.0–149.0)
VLDL: 36.2 mg/dL (ref 0.0–40.0)

## 2019-03-22 LAB — POCT GLYCOSYLATED HEMOGLOBIN (HGB A1C): Hemoglobin A1C: 11.7 % — AB (ref 4.0–5.6)

## 2019-03-22 NOTE — Patient Instructions (Signed)
-  Nice seeing you today!!  -Lab work today; will notify you once results are available.  -Make sure you are taking metformin 1000 mg twice daily.  -Will see you back in 3 months.

## 2019-03-22 NOTE — Progress Notes (Signed)
Established Patient Office Visit     This visit occurred during the SARS-CoV-2 public health emergency.  Safety protocols were in place, including screening questions prior to the visit, additional usage of staff PPE, and extensive cleaning of exam room while observing appropriate contact time as indicated for disinfecting solutions.    CC/Reason for Visit: 69-monthfollow-up  HPI: VQUENCY TOBERis a 53y.o. male who is coming in today for the above mentioned reasons. 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 KLetta Patewith PM and R for spastic hemiplegia mainly of his right arm.  He was a smoker but quit at the time of his CVA.  He stopped taking his Metformin because of stomach upset about 4 weeks ago, did not notify [place] this change.  Other than continued pain in his right shoulder and upper arm, he has no acute complaints.  He is due to have his lipids rechecked.  Past Medical/Surgical History: Past Medical History:  Diagnosis Date  . Diabetes mellitus without complication ([place]   . Hyperlipidemia     Past Surgical History:  Procedure Laterality Date  . ANKLE SURGERY      Social History:  reports that he has quit smoking. He has never used smokeless tobacco. He reports that he does not drink alcohol or use drugs.  Allergies: No Known Allergies  Family History:  Family History  Problem Relation Age of Onset  . Heart disease Mother   . Lung cancer Father   . Diabetes Sister   . Diabetes Brother   . CVA Neg Hx      Current Outpatient Medications:  .  acetaminophen (TYLENOL) 325 MG tablet, Take 2 tablets (650 mg total) by mouth every 4 (four) hours as needed for mild pain (or temp > 37.5 C (99.5 F))., Disp: , Rfl:  .  aspirin EC 81 MG EC tablet, Take 1 tablet (81 mg total) by mouth daily., Disp: , Rfl:  .  atorvastatin (LIPITOR)  80 MG tablet, Take 1 tablet (80 mg total) by mouth daily., Disp: 90 tablet, Rfl: 1 .  blood glucose meter kit and supplies KIT, Dispense based on patient and insurance preference. Use up to four times daily as directed. (FOR ICD-9 250.00, 250.01)., Disp: 1 each, Rfl: 0 .  diclofenac sodium (VOLTAREN) 1 % GEL, Apply 2 g topically 4 (four) times daily., Disp: 1 Tube, Rfl: 3 .  gabapentin (NEURONTIN) 300 MG capsule, Take 1 capsule (300 mg total) by mouth 3 (three) times daily., Disp: 90 capsule, Rfl: 5 .  glipiZIDE (GLUCOTROL) 10 MG tablet, Take 1 tablet (10 mg total) by mouth 2 (two) times daily before a meal., Disp: 180 tablet, Rfl: 1 .  metFORMIN (GLUCOPHAGE) 1000 MG tablet, Take 1 tablet (1,000 mg total) by mouth 2 (two) times daily with a meal., Disp: 180 tablet, Rfl: 1 .  tiZANidine (ZANAFLEX) 4 MG tablet, Take 1 tablet (4 mg total) by mouth at bedtime., Disp: 30 tablet, Rfl: 5 .  traMADol (ULTRAM) 50 MG tablet, Take 1 tablet (50 mg total) by mouth at bedtime., Disp: 30 tablet, Rfl: 1  Review of Systems:  Constitutional: Denies fever, chills, diaphoresis, appetite change and fatigue.  HEENT: Denies photophobia, eye pain, redness, hearing loss, ear pain, congestion, sore throat, rhinorrhea, sneezing, mouth sores, trouble swallowing, neck pain, neck stiffness and tinnitus.   Respiratory: Denies SOB, DOE,  cough, chest tightness,  and wheezing.   Cardiovascular: Denies chest pain, palpitations and leg swelling.  Gastrointestinal: Denies nausea, vomiting, abdominal pain, diarrhea, constipation, blood in stool and abdominal distention.  Genitourinary: Denies dysuria, urgency, frequency, hematuria, flank pain and difficulty urinating.  Endocrine: Denies: hot or cold intolerance, sweats, changes in hair or nails, polyuria, polydipsia. Musculoskeletal: Denies myalgias, back pain, joint swelling, arthralgias and gait problem.  Skin: Denies pallor, rash and wound.  Neurological: Denies dizziness,  seizures, syncope, weakness, light-headedness, numbness and headaches.  Hematological: Denies adenopathy. Easy bruising, personal or family bleeding history  Psychiatric/Behavioral: Denies suicidal ideation, mood changes, confusion, nervousness, sleep disturbance and agitation    Physical Exam: Vitals:   03/22/19 0857  BP: 110/70  Pulse: 62  Temp: (!) 97.3 F (36.3 C)  TempSrc: Temporal  SpO2: 97%  Weight: 153 lb 4.8 oz (69.5 kg)    Body mass index is 28.04 kg/m.   Constitutional: NAD, calm, comfortable Eyes: PERRL, lids and conjunctivae normal ENMT: Mucous membranes are moist.  Respiratory: clear to auscultation bilaterally, no wheezing, no crackles. Normal respiratory effort. No accessory muscle use.  Cardiovascular: Regular rate and rhythm, no murmurs / rubs / gallops. No extremity edema. 2+ pedal pulses.   Abdomen: no tenderness, no masses palpated. No hepatosplenomegaly. Bowel sounds positive.  Musculoskeletal: no clubbing / cyanosis. No joint deformity upper and lower extremities. Good ROM, no contractures. Normal muscle tone.  Skin: no rashes, lesions, ulcers. No induration Psychiatric: Normal judgment and insight. Alert and oriented x 3. Normal mood.    Impression and Plan:  Uncontrolled type 2 diabetes mellitus with hyperglycemia ([place])  -A1c is up to 11.7 from 9.43 months ago, not unexpected after discontinuing Metformin. -He is to continue glipizide 10 mg daily, will resume Metformin 1000 mg twice daily. -He will come in for follow-up in 3 months, if A1c still not below 7 (which I doubt it will be), will need to augment his therapy, consider Trulicity or Ozempic.  Other hyperlipidemia  - Plan: Lipid panel -LDL is not at goal of less than 70 at 133, he is on maximal atorvastatin therapy, will have repeat lipids done today.  Left pontine cerebrovascular accident [facility]) 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, unspecified hemiplegia  type ([place]) -Noted, follows with PM and R.  Tobacco abuse -Quit after CVA.  Diabetic polyneuropathy associated with type 2 diabetes mellitus (HCC) -Continue gabapentin.    Patient Instructions  -Nice seeing you today!!  -Lab work today; will notify you once results are available.  -Make sure you are taking metformin 1000 mg twice daily.  -Will see you back in 3 months.       Lelon Frohlich, MD [facility]

## 2019-03-30 ENCOUNTER — Encounter: Payer: Self-pay | Admitting: Gastroenterology

## 2019-04-01 ENCOUNTER — Telehealth: Payer: Self-pay | Admitting: Internal Medicine

## 2019-04-01 NOTE — Telephone Encounter (Signed)
metFORMIN (GLUCOPHAGE) 1000 MG tablet    Patient's daughter states this medication is causing patient to not have an appetite. She is requesting call back to discuss possible alternative medications.

## 2019-04-01 NOTE — Telephone Encounter (Signed)
Spoke with daughter and the patient will try a little longer.  Patient will also take OTC for acid reflux. Patient will schedule an appointment if needed.

## 2019-04-01 NOTE — Telephone Encounter (Signed)
Metformin is the best option for him: cheap and very effective. I would recommend continuing for now.

## 2019-04-05 ENCOUNTER — Telehealth: Payer: Self-pay | Admitting: Neurology

## 2019-04-05 NOTE — Telephone Encounter (Signed)
Patient's spouse called to check on the status of the forms for short-term disability that were sent in to Dr. Tomi Likens.

## 2019-04-05 NOTE — Telephone Encounter (Signed)
Have you seen these forms? 

## 2019-04-06 NOTE — Telephone Encounter (Signed)
I never received these forms and not sure why short term disability.  He would need to make an in office visit for evaluation and to discuss.

## 2019-04-06 NOTE — Telephone Encounter (Signed)
I never received these forms and not sure why it is ne

## 2019-04-06 NOTE — Telephone Encounter (Signed)
Pt called

## 2019-04-07 NOTE — Telephone Encounter (Signed)
Patient scheduled for a virtual visit on 04/13/19 at 10:50 with Dr. Tomi Likens.

## 2019-04-12 NOTE — Progress Notes (Signed)
Virtual Visit via Video Note The purpose of this virtual visit is to provide medical care while limiting exposure to the novel coronavirus.    Consent was obtained for video visit:  Yes.   Answered questions that patient had about telehealth interaction:  Yes.   I discussed the limitations, risks, security and privacy concerns of performing an evaluation and management service by telemedicine. I also discussed with the patient that there may be a patient responsible charge related to this service. The patient expressed understanding and agreed to proceed.  Pt location: Home Physician Location: office Name of referring provider:  Isaac Howard, Arthur Howard* I connected with Arthur Howard at patients initiation/request on 04/13/2019 at 10:50 AM EST by video enabled telemedicine application and verified that I am speaking with the correct person using two identifiers. Pt MRN:  [id number] Pt DOB:  [birth year] Video Participants:  Arthur Howard; his daughter   History of Present Illness:  Arthur Howard is a 52 year oldright-handed man with type 2 diabetes and hyperlipidemia who follows up for stroke.    UPDATE: Current medications:  ASA 72m daily; atorvastatin 858mdaily; metformin; glipizide; gabapentin 30038mhree times daily; tizanidine 4mg17m bedtime; tramadol  He still performs home exercises daily. Gabapentin was increased almost 2 months ago to 300mg19mee times daily.  He still has burning pain from the right occipital region radiating down the right siide of his neck and down in the arm He is able to use his right arm and hand, but it is painful.  He can dress himself but has trouble with any activity that involves raising the arm will increase pain.  He takes tizanidine which helps.  He also receives injections into the shoulder performed by Dr. KirstLetta PateSTORY: [facility] for stroke after waking up in the  morning withsome slurred speech andright upper and lower extremity weakness and numbness. CT of head demonstrated small age indeterminate infarct involving the posterior limb of the right internal capsule (which subsequently was shown to be chronic on MRI). CTA of head ane neck revealed no large vessel occlusion or stenosis. MRI of brain demonstrated small acute left inferior ventral pontine infarct as well as chronic small vessel ischemic changges and old right basal ganglia lacunar infarct. 2D echo showed EF 6-65% with no source of embolus. LDL was 133. Hgb A1c was 11.3. Telemetry revealed no arrhythmia. He was started on ASA 81mg 17mPlavix 75mg f53mecondary stroke prevention. His Lipitor was increased from 40mg to35mg dai16mDiabetes management was optimized. He was discharged to inpatient rehab and subsequently discharged home with outpatient rehab. He is now off Plavix but remains on ASA.  He still participates in outpatient physical therapy. He has been experiencing right arm pain. He also notes tingling in the right posterior aspect of his head.   Past Medical History: Past Medical History:  Diagnosis Date  . Diabetes mellitus without complication (HCC)   . [place]     Medications: Outpatient Encounter Medications as of 04/13/2019  Medication Sig  . acetaminophen (TYLENOL) 325 MG tablet Take 2 tablets (650 mg total) by mouth every 4 (four) hours as needed for mild pain (or temp > 37.5 C (99.5 F)).  . aspirinMarland KitchenEC 81 MG EC tablet Take 1 tablet (81 mg total) by mouth daily.  . atorvasMarland Kitchenatin (LIPITOR) 80 MG tablet Take 1 tablet (80 mg total) by mouth daily.  . blood glucose  meter kit and supplies KIT Dispense based on patient and insurance preference. Use up to four times daily as directed. (FOR ICD-9 250.00, 250.01).  Marland Kitchen diclofenac sodium (VOLTAREN) 1 % GEL Apply 2 g topically 4 (four) times daily.  Marland Kitchen gabapentin (NEURONTIN) 300 MG capsule Take 1 capsule (300 mg total)  by mouth 3 (three) times daily.  Marland Kitchen glipiZIDE (GLUCOTROL) 10 MG tablet Take 1 tablet (10 mg total) by mouth 2 (two) times daily before a meal.  . metFORMIN (GLUCOPHAGE) 1000 MG tablet Take 1 tablet (1,000 mg total) by mouth 2 (two) times daily with a meal.  . tiZANidine (ZANAFLEX) 4 MG tablet Take 1 tablet (4 mg total) by mouth at bedtime.  . traMADol (ULTRAM) 50 MG tablet Take 1 tablet (50 mg total) by mouth at bedtime.   No facility-administered encounter medications on file as of 04/13/2019.    Allergies: No Known Allergies  Family History: Family History  Problem Relation Age of Onset  . Heart disease Mother   . Lung cancer Father   . Diabetes Sister   . Diabetes Brother   . CVA Neg Hx     Social History: Social History   Socioeconomic History  . Marital status: Married    Spouse name: Arthur Howard  . Number of children: 7  . Years of education: Not on file  . Highest education level: 6th grade  Occupational History  . Occupation: Sales executive    Comment: [employer]  Tobacco Use  . Smoking status: Former Research scientist (life sciences)  . Smokeless tobacco: Never Used  . Tobacco comment: never a daily smoker  Substance and Sexual Activity  . Alcohol use: No    Alcohol/week: 0.0 standard drinks  . Drug use: No  . Sexual activity: Not on file  Other Topics Concern  . Not on file  Social History Narrative   Works with Sales executive. Lives in [place]. Moved from [place] 35 years ago. He is married. Has 7 children (all girls).       Patient is right-handed. He lives with his wife in a one level home. He walks daily for exercise.   Social Determinants of Health   Financial Resource Strain:   . Difficulty of Paying Living Expenses: Not on file  Food Insecurity:   . Worried About Charity fundraiser in the Last Year: Not on file  . Ran Out of Food in the Last Year: Not on file  Transportation Needs:   . Lack of Transportation (Medical): Not on file  . Lack of Transportation (Non-Medical): Not on file   Physical Activity:   . Days of Exercise per Week: Not on file  . Minutes of Exercise per Session: Not on file  Stress:   . Feeling of Stress : Not on file  Social Connections:   . Frequency of Communication with Friends and Family: Not on file  . Frequency of Social Gatherings with Friends and Family: Not on file  . Attends Religious Services: Not on file  . Active Member of Clubs or Organizations: Not on file  . Attends Archivist Meetings: Not on file  . Marital Status: Not on file  Intimate Partner Violence:   . Fear of Current or Ex-Partner: Not on file  . Emotionally Abused: Not on file  . Physically Abused: Not on file  . Sexually Abused: Not on file    Observations/Objective:   Height '5\' 4"'  (1.626 m), weight 153 lb (69.4 kg).   Assessment and Plan:   1.  Right sided hemiplegia as late effect of small left pontine infarct secondary to small vessel disease.  2.  Right upper extremity complex regional pain syndrome 3.  Type 2 diabetes mellitus 4.  Hyperlipidemia  1.  Titrate gabapentin up to 622m three times daily 2.  Follow up in June as scheduled.  Follow Up Instructions:    -I discussed the assessment and treatment plan with the patient. The patient was provided an opportunity to ask questions and all were answered. The patient agreed with the plan and demonstrated an understanding of the instructions.   The patient was advised to call back or seek an in-person evaluation if the symptoms worsen or if the condition fails to improve as anticipated.    ADudley Major DO

## 2019-04-13 ENCOUNTER — Other Ambulatory Visit: Payer: Self-pay

## 2019-04-13 ENCOUNTER — Encounter: Payer: Self-pay | Admitting: Neurology

## 2019-04-13 ENCOUNTER — Telehealth (INDEPENDENT_AMBULATORY_CARE_PROVIDER_SITE_OTHER): Payer: Commercial Indemnity | Admitting: Neurology

## 2019-04-13 VITALS — Ht 64.0 in | Wt 153.0 lb

## 2019-04-13 DIAGNOSIS — E119 Type 2 diabetes mellitus without complications: Secondary | ICD-10-CM

## 2019-04-13 DIAGNOSIS — M79601 Pain in right arm: Secondary | ICD-10-CM

## 2019-04-13 DIAGNOSIS — G894 Chronic pain syndrome: Secondary | ICD-10-CM | POA: Diagnosis not present

## 2019-04-13 DIAGNOSIS — I69351 Hemiplegia and hemiparesis following cerebral infarction affecting right dominant side: Secondary | ICD-10-CM | POA: Diagnosis not present

## 2019-04-13 DIAGNOSIS — I6350 Cerebral infarction due to unspecified occlusion or stenosis of unspecified cerebral artery: Secondary | ICD-10-CM

## 2019-04-13 DIAGNOSIS — Z7982 Long term (current) use of aspirin: Secondary | ICD-10-CM

## 2019-04-13 DIAGNOSIS — E785 Hyperlipidemia, unspecified: Secondary | ICD-10-CM

## 2019-04-13 DIAGNOSIS — Z7902 Long term (current) use of antithrombotics/antiplatelets: Secondary | ICD-10-CM

## 2019-04-13 DIAGNOSIS — I639 Cerebral infarction, unspecified: Secondary | ICD-10-CM

## 2019-04-13 MED ORDER — GABAPENTIN 300 MG PO CAPS: ORAL_CAPSULE | ORAL | 0 refills | Status: DC

## 2019-04-13 MED FILL — GABAPENTIN 300 MG CAPSULE: 300 | 180 days supply | Qty: 180 | Fill #0

## 2019-04-14 ENCOUNTER — Telehealth: Payer: Self-pay

## 2019-04-14 ENCOUNTER — Telehealth: Payer: Self-pay | Admitting: Neurology

## 2019-04-14 NOTE — Telephone Encounter (Signed)
Unable to fill out form per Dr. Tomi Likens, must get forms completed by Dr.Hernandez.

## 2019-04-14 NOTE — Telephone Encounter (Signed)
Patient's daughter called to confirm Dr. Tomi Likens received the fax she sent from [employer] for life insurance that he'll need completed.

## 2019-04-15 ENCOUNTER — Other Ambulatory Visit: Payer: Self-pay

## 2019-04-15 ENCOUNTER — Encounter: Payer: Commercial Indemnity | Attending: Registered Nurse | Admitting: Physical Medicine & Rehabilitation

## 2019-04-15 ENCOUNTER — Encounter: Payer: Self-pay | Admitting: Physical Medicine & Rehabilitation

## 2019-04-15 ENCOUNTER — Telehealth: Payer: Self-pay | Admitting: Gastroenterology

## 2019-04-15 ENCOUNTER — Ambulatory Visit (AMBULATORY_SURGERY_CENTER): Payer: Self-pay

## 2019-04-15 VITALS — BP 148/88 | HR 65 | Temp 98.0°F | Ht 64.0 in | Wt 157.0 lb

## 2019-04-15 VITALS — Temp 98.0°F | Ht 62.0 in | Wt 157.8 lb

## 2019-04-15 DIAGNOSIS — M7501 Adhesive capsulitis of right shoulder: Secondary | ICD-10-CM | POA: Diagnosis not present

## 2019-04-15 DIAGNOSIS — I69351 Hemiplegia and hemiparesis following cerebral infarction affecting right dominant side: Secondary | ICD-10-CM | POA: Diagnosis not present

## 2019-04-15 DIAGNOSIS — E7849 Other hyperlipidemia: Secondary | ICD-10-CM | POA: Diagnosis present

## 2019-04-15 DIAGNOSIS — Z01818 Encounter for other preprocedural examination: Secondary | ICD-10-CM

## 2019-04-15 DIAGNOSIS — E1165 Type 2 diabetes mellitus with hyperglycemia: Secondary | ICD-10-CM | POA: Insufficient documentation

## 2019-04-15 DIAGNOSIS — Z1211 Encounter for screening for malignant neoplasm of colon: Secondary | ICD-10-CM

## 2019-04-15 DIAGNOSIS — I6350 Cerebral infarction due to unspecified occlusion or stenosis of unspecified cerebral artery: Secondary | ICD-10-CM | POA: Diagnosis not present

## 2019-04-15 MED ORDER — NA SULFATE-K SULFATE-MG SULF 17.5-3.13-1.6 GM/177ML PO SOLN: 1.0000 | Freq: Once | ORAL | 0 refills | Status: AC

## 2019-04-15 MED ORDER — TRAMADOL HCL 50 MG PO TABS: 50.0000 mg | ORAL_TABLET | Freq: Every day | ORAL | 1 refills | Status: DC

## 2019-04-15 MED FILL — traMADol HCL 50 MG TABS: 50 | 30 days supply | Qty: 30 | Fill #0

## 2019-04-15 MED FILL — SUPREP BOWEL PREP KIT: 17.5-3.13-1 | 1 days supply | Qty: 354 | Fill #0

## 2019-04-15 NOTE — Progress Notes (Signed)
No egg or soy allergy known to patient  No issues with past sedation with any surgeries  or procedures, no intubation problems  No diet pills per patient No home 02 use per patient  No blood thinners per patient  Pt denies issues with constipation  No A fib or A flutter  EMMI video sent to pt's e mail   Daughter interpreting,   Due to the COVID-19 pandemic we are asking patients to follow these guidelines. Please only bring one care partner. Please be aware that your care partner may wait in the car in the parking lot or if they feel like they will be too hot to wait in the car, they may wait in the lobby on the 4th floor. All care partners are required to wear a mask the entire time (we do not have any that we can provide them), they need to practice social distancing, and we will do a Covid check for all patient's and care partners when you arrive. Also we will check their temperature and your temperature. If the care partner waits in their car they need to stay in the parking lot the entire time and we will call them on their cell phone when the patient is ready for discharge so they can bring the car to the front of the building. Also all patient's will need to wear a mask into building.

## 2019-04-15 NOTE — Progress Notes (Signed)
Subjective:    Patient ID: Arthur Howard, male    DOB: 1965/11/04, 54 y.o.   MRN: [id number]  HPI  54 year old male with history of left pontine infarct causing chronic right hemiparesis onset in February 202020.  He is back to modified independent level but has not been able to return to work.  He does do manual labor.  Pain in shoulder better after 2nd US guided glenohumeral injection, still is limited in terms of shoulder range of motion as well as right upper extremity strength.  Per pt increased Gabapentin has not been very helpful  Tizanidine and tramadol are helpful  Pain Inventory Average Pain 7 Pain Right Now 3 My pain is sharp  In the last 24 hours, has pain interfered with the following? General activity 7 Relation with others 1 Enjoyment of life 7 What TIME of day is your pain at its worst? all Sleep (in general) Fair  Pain is worse with: unsure Pain improves with: medication Relief from Meds: 6  Mobility ability to climb steps?  yes do you drive?  no  Function not employed: date last employed 2020  Neuro/Psych trouble walking  Prior Studies Any changes since last visit?  no  Physicians involved in your care Any changes since last visit?  no   Family History  Problem Relation Age of Onset  . Heart disease Mother   . Lung cancer Father   . Diabetes Sister   . Diabetes Brother   . CVA Neg Hx    Social History   Socioeconomic History  . Marital status: Married    Spouse name: Verdis Frederickson  . Number of children: 7  . Years of education: Not on file  . Highest education level: 6th grade  Occupational History  . Occupation: Sales executive    Comment: [employer]  Tobacco Use  . Smoking status: Former Research scientist (life sciences)  . Smokeless tobacco: Never Used  . Tobacco comment: never a daily smoker  Substance and Sexual Activity  . Alcohol use: No    Alcohol/week: 0.0 standard drinks  . Drug use: No  . Sexual activity: Not on file  Other Topics Concern  . Not on file    Social History Narrative   Works with Sales executive. Lives in [place]. Moved from [place] 35 years ago. He is married. Has 7 children (all girls).       Patient is right-handed. He lives with his wife in a one level home. He walks daily for exercise.   Social Determinants of Health   Financial Resource Strain:   . Difficulty of Paying Living Expenses: Not on file  Food Insecurity:   . Worried About Charity fundraiser in the Last Year: Not on file  . Ran Out of Food in the Last Year: Not on file  Transportation Needs:   . Lack of Transportation (Medical): Not on file  . Lack of Transportation (Non-Medical): Not on file  Physical Activity:   . Days of Exercise per Week: Not on file  . Minutes of Exercise per Session: Not on file  Stress:   . Feeling of Stress : Not on file  Social Connections:   . Frequency of Communication with Friends and Family: Not on file  . Frequency of Social Gatherings with Friends and Family: Not on file  . Attends Religious Services: Not on file  . Active Member of Clubs or Organizations: Not on file  . Attends Archivist Meetings: Not on file  . Marital Status: Not  on file   Past Surgical History:  Procedure Laterality Date  . ANKLE SURGERY     Past Medical History:  Diagnosis Date  . Diabetes mellitus without complication ([place])   . Hyperlipidemia    There were no vitals taken for this visit.  Opioid Risk Score:   Fall Risk Score:  `1  Depression screen PHQ 2/9  Depression screen [facility] 2/9 08/19/2018 08/02/2018 07/05/2018 05/26/2018 05/30/2017 09/20/2016 09/18/2016  Decreased Interest 1 1 0 0 0 0 0  Down, Depressed, Hopeless 1 1 0 0 0 0 0  PHQ - 2 Score 2 2 0 0 0 0 0    Review of Systems  Constitutional: Negative.   HENT: Negative.   Eyes: Negative.   Respiratory: Negative.   Cardiovascular: Negative.   Gastrointestinal: Negative.   Endocrine: Negative.   Genitourinary: Negative.   Musculoskeletal: Positive for arthralgias.  Skin:  Negative.   Allergic/Immunologic: Negative.   Neurological: Negative.   Hematological: Negative.   Psychiatric/Behavioral: Negative.   All other systems reviewed and are negative.      Objective:   Physical Exam Constitutional:      Appearance: Normal appearance. He is normal weight.  Eyes:     Extraocular Movements: Extraocular movements intact.     Conjunctiva/sclera: Conjunctivae normal.     Pupils: Pupils are equal, round, and reactive to light.  Skin:    General: Skin is warm and dry.  Neurological:     Mental Status: He is alert and oriented to person, place, and time.  Psychiatric:        Mood and Affect: Mood normal.        Behavior: Behavior normal.    The patient has reduced right shoulder range of motion with external rotation around 20 degrees abduction around 90 degrees and forward flexion around the 90 degrees. He does have pain with external rotation at end range as well as with abduction. No pain with neck range of motion no pain to palpation in the cervical spine area Right lower extremity strength is 4/5 in the hip flexor knee extensor ankle dorsiflexor Right upper extremity strength 3 - at the deltoid 4 at the bicep tricep 4 - finger flexors and extensors Fine motor is reduced right upper upper extremity with finger to thumb opposition.  Also dysdiadochokinesis affecting rapid alternating supination pronation of the right upper extremity. Gait is without evidence of toe drag or knee instability.       Assessment & Plan:  #1.  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 2.  Adhesive capsulitis right shoulder overall improved after the second ultrasound-guided injection.  We discussed that this injection can be repeated but would not repeat for at least 2 more months.  In terms of pain  associated with his right upper extremity posterior shoulder pain we will continue tramadol 50 mg at night and tizanidine as written per Dr. Tomi Likens.

## 2019-04-22 ENCOUNTER — Telehealth: Payer: Self-pay | Admitting: Internal Medicine

## 2019-04-22 NOTE — Telephone Encounter (Signed)
Pts daughter is calling wanting to know if Lanny Cramp has sent in a form to be completed by Dr. Jerilee Hoh.  They would like to have a call back verifying that it has been received.

## 2019-04-22 NOTE — Telephone Encounter (Signed)
Informed daughter that we have not yet received the forms.  Daughter will have them re faxed.

## 2019-04-25 ENCOUNTER — Encounter: Payer: Self-pay | Admitting: Gastroenterology

## 2019-04-26 ENCOUNTER — Ambulatory Visit (INDEPENDENT_AMBULATORY_CARE_PROVIDER_SITE_OTHER): Payer: Commercial Indemnity

## 2019-04-26 ENCOUNTER — Other Ambulatory Visit: Payer: Self-pay | Admitting: Gastroenterology

## 2019-04-26 DIAGNOSIS — Z1159 Encounter for screening for other viral diseases: Secondary | ICD-10-CM

## 2019-04-26 LAB — SARS CORONAVIRUS 2 (TAT 6-24 HRS): SARS Coronavirus 2: NEGATIVE

## 2019-04-29 ENCOUNTER — Encounter: Payer: Self-pay | Admitting: Gastroenterology

## 2019-04-29 ENCOUNTER — Ambulatory Visit (AMBULATORY_SURGERY_CENTER): Payer: Self-pay | Admitting: Gastroenterology

## 2019-04-29 ENCOUNTER — Other Ambulatory Visit: Payer: Self-pay

## 2019-04-29 VITALS — BP 101/40 | HR 68 | Temp 97.3°F | Resp 14 | Ht 62.0 in | Wt 157.8 lb

## 2019-04-29 DIAGNOSIS — K5140 Inflammatory polyps of colon without complications: Secondary | ICD-10-CM

## 2019-04-29 DIAGNOSIS — D122 Benign neoplasm of ascending colon: Secondary | ICD-10-CM

## 2019-04-29 DIAGNOSIS — D125 Benign neoplasm of sigmoid colon: Secondary | ICD-10-CM

## 2019-04-29 DIAGNOSIS — Z1211 Encounter for screening for malignant neoplasm of colon: Secondary | ICD-10-CM

## 2019-04-29 DIAGNOSIS — K388 Other specified diseases of appendix: Secondary | ICD-10-CM

## 2019-04-29 MED ORDER — SODIUM CHLORIDE 0.9 % IV SOLN: 500.0000 mL | Freq: Once | INTRAVENOUS | Status: DC

## 2019-04-29 NOTE — Op Note (Signed)
[place] Patient Name: Arthur Howard Procedure Date: 04/29/2019 8:26 AM MRN: [id number] Endoscopist: Milus Banister , MD Age: 54 Referring MD:  Date of Birth: 12/03/1965 Gender: Male Account #: [id number] Procedure:                Colonoscopy Indications:              Screening for colorectal malignant neoplasm Medicines:                Monitored Anesthesia Care Procedure:                Pre-Anesthesia Assessment:                           - Prior to the procedure, a History and Physical                            was performed, and patient medications and                            allergies were reviewed. The patient's tolerance of                            previous anesthesia was also reviewed. The risks                            and benefits of the procedure and the sedation                            options and risks were discussed with the patient.                            All questions were answered, and informed consent                            was obtained. Prior Anticoagulants: The patient has                            taken no previous anticoagulant or antiplatelet                            agents. ASA Grade Assessment: II - A patient with                            mild systemic disease. After reviewing the risks                            and benefits, the patient was deemed in                            satisfactory condition to undergo the procedure.                           After obtaining informed consent, the colonoscope  was passed under direct vision. Throughout the                            procedure, the patient's blood pressure, pulse, and                            oxygen saturations were monitored continuously. The                            Colonoscope was introduced through the anus and                            advanced to the the cecum, identified by                            appendiceal orifice and  ileocecal valve. The                            colonoscopy was performed without difficulty. The                            patient tolerated the procedure well. The quality                            of the bowel preparation was good. The ileocecal                            valve, appendiceal orifice, and rectum were                            photographed. Scope In: 8:30:50 AM Scope Out: 8:47:41 AM Scope Withdrawal Time: 0 hours 14 minutes 35 seconds  Total Procedure Duration: 0 hours 16 minutes 51 seconds  Findings:                 A 4 mm polyp was found in the sigmoid colon. The                            polyp was pedunculated. The polyp was removed with                            a cold snare. Resection and retrieval were complete.                           Edematous, nodular appendiceal orifice. Biopsies                            were taken with a cold forceps for histology.                           The exam was otherwise without abnormality on                            direct and retroflexion views. Complications:  No immediate complications. Estimated blood loss:                            None. Estimated Blood Loss:     Estimated blood loss: none. Impression:               - One 4 mm polyp in the sigmoid colon, removed with                            a cold snare. Resected and retrieved.                           - Edematous, nodular appendiceal orifice. Biopsied.                           - The examination was otherwise normal on direct                            and retroflexion views. Recommendation:           - Patient has a contact number available for                            emergencies. The signs and symptoms of potential                            delayed complications were discussed with the                            patient. Return to normal activities tomorrow.                            Written discharge instructions were provided to the                             patient.                           - Resume previous diet.                           - Continue present medications.                           - Await pathology results. May need further                            evaluation of the abnormal appendiceal orifice                            pending path results (CT vs. elective appendectomy?) Milus Banister, MD 04/29/2019 8:52:39 AM This report has been signed electronically.

## 2019-04-29 NOTE — Progress Notes (Signed)
Temp by JB and vitals by CW

## 2019-04-29 NOTE — Patient Instructions (Signed)
YOU HAD AN ENDOSCOPIC PROCEDURE TODAY AT THE [facility]:   Refer to the procedure report that was given to you for any specific questions about what was found during the examination.  If the procedure report does not answer your questions, please call your gastroenterologist to clarify.  If you requested that your care partner not be given the details of your procedure findings, then the procedure report has been included in a sealed envelope for you to review at your convenience later.  **Handout given on polyps**   YOU SHOULD EXPECT: Some feelings of bloating in the abdomen. Passage of more gas than usual.  Walking can help get rid of the air that was put into your GI tract during the procedure and reduce the bloating. If you had a lower endoscopy (such as a colonoscopy or flexible sigmoidoscopy) you may notice spotting of blood in your stool or on the toilet paper. If you underwent a bowel prep for your procedure, you may not have a normal bowel movement for a few days.  Please Note:  You might notice some irritation and congestion in your nose or some drainage.  This is from the oxygen used during your procedure.  There is no need for concern and it should clear up in a day or so.  SYMPTOMS TO REPORT IMMEDIATELY:   Following lower endoscopy (colonoscopy or flexible sigmoidoscopy):  Excessive amounts of blood in the stool  Significant tenderness or worsening of abdominal pains  Swelling of the abdomen that is new, acute  Fever of 100F or higher   For urgent or emergent issues, a gastroenterologist can be reached at any hour by calling [phone].   DIET:  We do recommend a small meal at first, but then you may proceed to your regular diet.  Drink plenty of fluids but you should avoid alcoholic beverages for 24 hours.  ACTIVITY:  You should plan to take it easy for the rest of today and you should NOT DRIVE or use heavy machinery until tomorrow (because of the sedation  medicines used during the test).    FOLLOW UP: Our staff will call the number listed on your records 48-72 hours following your procedure to check on you and address any questions or concerns that you may have regarding the information given to you following your procedure. If we do not reach you, we will leave a message.  We will attempt to reach you two times.  During this call, we will ask if you have developed any symptoms of COVID 19. If you develop any symptoms (ie: fever, flu-like symptoms, shortness of breath, cough etc.) before then, please call [phone].  If you test positive for Covid 19 in the 2 weeks post procedure, please call and report this information to us.    If any biopsies were taken you will be contacted by phone or by letter within the next 1-3 weeks.  Please call us at [phone] if you have not heard about the biopsies in 3 weeks.    SIGNATURES/CONFIDENTIALITY: You and/or your care partner have signed paperwork which will be entered into your electronic medical record.  These signatures attest to the fact that that the information above on your After Visit Summary has been reviewed and is understood.  Full responsibility of the confidentiality of this discharge information lies with you and/or your care-partner. 

## 2019-04-29 NOTE — Progress Notes (Signed)
Called to room to assist during endoscopic procedure.  Patient ID and intended procedure confirmed with present staff. Received instructions for my participation in the procedure from the performing physician.  

## 2019-04-29 NOTE — Progress Notes (Signed)
To PACU, VSS. Report to [email or web address].tb 

## 2019-05-02 MED FILL — metFORMIN HCL 1000 MG TABS: 1000 | 90 days supply | Qty: 180 | Fill #1

## 2019-05-03 ENCOUNTER — Telehealth: Payer: Self-pay | Admitting: *Deleted

## 2019-05-03 NOTE — Telephone Encounter (Signed)
  Follow up Call-  Call back number 04/29/2019  Post procedure Call Back phone  # [phone]  Permission to leave phone message Yes  Some recent data might be hidden     Patient questions:  Do you have a fever, pain , or abdominal swelling? No. Pain Score  0 *  Have you tolerated food without any problems? Yes.  -c/o not having full appetite back yet. Encouraged to try to eat small meals more frequently to see if this resolves. Encouraged to call back if appetite continues to be poor.   Have you been able to return to your normal activities? Yes.    Do you have any questions about your discharge instructions: Diet   No. Medications  No. Follow up visit  No.  Do you have questions or concerns about your Care? No.  Actions: * If pain score is 4 or above: No action needed, pain <4.  1. Have you developed a fever since your procedure? no  2.   Have you had an respiratory symptoms (SOB or cough) since your procedure? no  3.   Have you tested positive for COVID 19 since your procedure no  4.   Have you had any family members/close contacts diagnosed with the COVID 19 since your procedure?  no   If yes to any of these questions please route to Joylene John, RN and Alphonsa Gin, Therapist, sports.

## 2019-05-17 ENCOUNTER — Telehealth: Payer: Self-pay | Admitting: Gastroenterology

## 2019-05-17 DIAGNOSIS — R933 Abnormal findings on diagnostic imaging of other parts of digestive tract: Secondary | ICD-10-CM

## 2019-05-17 NOTE — Telephone Encounter (Signed)
Pt's daughter Lattie Haw inquired about path results.

## 2019-05-17 NOTE — Telephone Encounter (Signed)
Dr Ardis Hughs have you reviewed path from 2/5?

## 2019-05-18 NOTE — Telephone Encounter (Signed)
The patient has been notified of this information and all questions answered.  The pt has been advised to call with any further questions.

## 2019-05-18 NOTE — Telephone Encounter (Signed)
BUN and Creat will need to pick up contrast from [facility] and have labs the same day at lesast 2 days prior   You are scheduled on 05/27/19 at 1230 pm. You should arrive 15 minutes prior to your appointment time for registration. Please follow the written instructions below on the day of your exam:  Left message on machine to call back

## 2019-05-18 NOTE — Telephone Encounter (Signed)
Patty, Please call his daughter and apologize about the delay.  The polyp I removed was not precancerous. He needs recall colonoscopy in 10 years. The biopsies I took from protuberent appendiceal orifice showed 'edema' only.  He needs CT scan abd/pelvis with IV and oral contrast. To continue workup of protuberant appendiceal orifice.  Thanks  Megan, no result note is necessary.  Thanks.

## 2019-05-20 NOTE — Telephone Encounter (Signed)
Pt daughter called in and wanted to know if pt can try a different medication other than the Metformin. She said that pt has no appetite and it makes him feel sick.

## 2019-05-24 ENCOUNTER — Telehealth: Payer: Self-pay | Admitting: Internal Medicine

## 2019-05-24 NOTE — Telephone Encounter (Signed)
Done

## 2019-05-24 NOTE — Telephone Encounter (Signed)
[name] is this patients daughter. She wanted to let Dr. Jerilee Hoh know that her dad's diabetic medication is making him sick to the point, where he has no appetite. Is there another medication that he can change to that doesn't make him sick?  Please advise

## 2019-05-25 ENCOUNTER — Other Ambulatory Visit (INDEPENDENT_AMBULATORY_CARE_PROVIDER_SITE_OTHER): Payer: Commercial Indemnity

## 2019-05-25 DIAGNOSIS — R933 Abnormal findings on diagnostic imaging of other parts of digestive tract: Secondary | ICD-10-CM

## 2019-05-25 LAB — BUN: BUN: 11 mg/dL (ref 6–23)

## 2019-05-25 LAB — CREATININE, SERUM: Creatinine, Ser: 0.89 mg/dL (ref 0.40–1.50)

## 2019-05-25 NOTE — Telephone Encounter (Signed)
Attempted to call daughter but unable to leave a message.  Patient will need to schedule an office visit.

## 2019-05-26 ENCOUNTER — Telehealth: Payer: Self-pay | Admitting: Gastroenterology

## 2019-05-27 ENCOUNTER — Ambulatory Visit (HOSPITAL_COMMUNITY): Payer: Commercial Indemnity

## 2019-05-27 NOTE — Telephone Encounter (Signed)
done

## 2019-06-01 ENCOUNTER — Telehealth: Payer: Self-pay | Admitting: Gastroenterology

## 2019-06-01 NOTE — Telephone Encounter (Signed)
Katelyn from [facility] called and stated that the order is not signed.  She also said that this will be a new patient that will need to be registered and requested pt's demographics faxed to [phone].

## 2019-06-01 NOTE — Telephone Encounter (Signed)
I have sent the order to [facility] as requested.  I did ask that the report get faxed to our office for review.

## 2019-06-01 NOTE — Telephone Encounter (Signed)
Pt's insurance called to have imaging orders faxed to [facility]: [phone].

## 2019-06-01 NOTE — Telephone Encounter (Signed)
Order faxed and demographics sent as well

## 2019-06-02 NOTE — Telephone Encounter (Signed)
Left message on machine for patient to return our call 

## 2019-06-03 ENCOUNTER — Other Ambulatory Visit: Payer: Self-pay

## 2019-06-03 ENCOUNTER — Telehealth (INDEPENDENT_AMBULATORY_CARE_PROVIDER_SITE_OTHER): Payer: Commercial Indemnity | Admitting: Adult Health

## 2019-06-03 ENCOUNTER — Encounter: Payer: Self-pay | Admitting: Adult Health

## 2019-06-03 DIAGNOSIS — M545 Low back pain, unspecified: Secondary | ICD-10-CM

## 2019-06-03 DIAGNOSIS — M79651 Pain in right thigh: Secondary | ICD-10-CM | POA: Diagnosis not present

## 2019-06-03 NOTE — Progress Notes (Signed)
Virtual Visit via Video Note  I connected with@ on 06/03/19 at  3:00 PM EST by a video enabled telemedicine application and verified that I am speaking with the correct person using two identifiers.  Location patient: home Location provider:work or home office Persons participating in the virtual visit: patient, provider. daughter  I discussed the limitations of evaluation and management by telemedicine and the availability of in person appointments. The patient expressed understanding and agreed to proceed.   HPI:  54 year old male who  has a past medical history of Diabetes mellitus without complication ([place]) (3818), Hyperlipidemia, and Stroke ([place]) (05/06/2018).   His daughter is present who does the interpretation.   He is a patient of Dr. Jerilee Hoh who I am seeing today for chronic issue.  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  He was seen by physical medicine and rehab.  Since that time he is experienced right lower back and right thigh pain but this pain has become worse over the last few weeks.  He also reports that he fell at work about 16 years ago and has had lower back pain on and off since that time.  He reports the pain is described as "a stabbing" and nothing helps relieve except for laying down.  Does not provide difficulty with ambulation or ADLs.  He has not had any falls or trauma that could have caused the increased pain.  His prescribed medications including tramadol, gabapentin, and Tylenol do not help relieve the pain and he has also tried muscle relaxers in the past that did not relieve the pain.  ROS: See pertinent positives and negatives per HPI.  Past Medical History:  Diagnosis Date  . Diabetes mellitus without complication ([place]) 2993  . Hyperlipidemia   . Stroke [facility]) 05/06/2018   R sided weakness    Past Surgical History:  Procedure Laterality Date  . ANKLE SURGERY      Family History  Problem  Relation Age of Onset  . Heart disease Mother   . Lung cancer Father   . Diabetes Sister   . Diabetes Brother   . CVA Neg Hx   . Colon cancer Neg Hx   . Colon polyps Neg Hx   . Esophageal cancer Neg Hx   . Rectal cancer Neg Hx   . Stomach cancer Neg Hx        Current Outpatient Medications:  .  acetaminophen (TYLENOL) 325 MG tablet, Take 2 tablets (650 mg total) by mouth every 4 (four) hours as needed for mild pain (or temp > 37.5 C (99.5 F))., Disp: , Rfl:  .  aspirin EC 81 MG EC tablet, Take 1 tablet (81 mg total) by mouth daily., Disp: , Rfl:  .  atorvastatin (LIPITOR) 80 MG tablet, Take 1 tablet (80 mg total) by mouth daily., Disp: 90 tablet, Rfl: 1 .  blood glucose meter kit and supplies KIT, Dispense based on patient and insurance preference. Use up to four times daily as directed. (FOR ICD-9 250.00, 250.01)., Disp: 1 each, Rfl: 0 .  gabapentin (NEURONTIN) 300 MG capsule, Take 1 capsule in AM, 1 capsule afternoon and 2 capsules at night for 1 week, then 2 capsules in AM, 1 capsule afternoon and 2 capsules at night for 1 week, then 2 capsules three times daily., Disp: 180 capsule, Rfl: 0 .  glipiZIDE (GLUCOTROL) 10 MG tablet, Take 1 tablet (10 mg total) by mouth 2 (two) times daily  before a meal., Disp: 180 tablet, Rfl: 1 .  metFORMIN (GLUCOPHAGE) 1000 MG tablet, Take 1 tablet (1,000 mg total) by mouth 2 (two) times daily with a meal., Disp: 180 tablet, Rfl: 1 .  traMADol (ULTRAM) 50 MG tablet, Take 1 tablet (50 mg total) by mouth at bedtime., Disp: 30 tablet, Rfl: 1  EXAM:  VITALS per patient if applicable:  GENERAL: alert, oriented, appears well and in no acute distress  HEENT: atraumatic, conjunttiva clear, no obvious abnormalities on inspection of external nose and ears  NECK: normal movements of the head and neck  LUNGS: on inspection no signs of respiratory distress, breathing rate appears normal, no obvious gross SOB, gasping or wheezing  CV: no obvious  cyanosis  MS: moves all visible extremities without noticeable abnormality  PSYCH/NEURO: pleasant and cooperative, no obvious depression or anxiety, speech and thought processing grossly intact  ASSESSMENT AND PLAN:  Discussed the following assessment and plan:  He and his daughter were advised that I am not able to prescribe stronger pain medication than he is already on.  Unfortunately, cannot evaluate him for this issue via video visit.  Since this is a chronic issue I am going to have him follow-up next week in the office for further evaluation and possibly x-rays.  He and his daughter are okay with this plan   I discussed the assessment and treatment plan with the patient. The patient was provided an opportunity to ask questions and all were answered. The patient agreed with the plan and demonstrated an understanding of the instructions.   The patient was advised to call back or seek an in-person evaluation if the symptoms worsen or if the condition fails to improve as anticipated.   Dorothyann Peng, NP

## 2019-06-06 ENCOUNTER — Ambulatory Visit (HOSPITAL_COMMUNITY): Payer: Commercial Indemnity

## 2019-06-07 ENCOUNTER — Other Ambulatory Visit: Payer: Self-pay

## 2019-06-07 ENCOUNTER — Encounter: Payer: Self-pay | Admitting: Physical Medicine & Rehabilitation

## 2019-06-07 ENCOUNTER — Encounter: Payer: Commercial Indemnity | Attending: Registered Nurse | Admitting: Physical Medicine & Rehabilitation

## 2019-06-07 VITALS — BP 124/80 | HR 72 | Temp 97.5°F | Ht 62.0 in | Wt 160.0 lb

## 2019-06-07 DIAGNOSIS — I6350 Cerebral infarction due to unspecified occlusion or stenosis of unspecified cerebral artery: Secondary | ICD-10-CM | POA: Insufficient documentation

## 2019-06-07 DIAGNOSIS — I69351 Hemiplegia and hemiparesis following cerebral infarction affecting right dominant side: Secondary | ICD-10-CM | POA: Diagnosis not present

## 2019-06-07 DIAGNOSIS — E7849 Other hyperlipidemia: Secondary | ICD-10-CM | POA: Insufficient documentation

## 2019-06-07 DIAGNOSIS — E1165 Type 2 diabetes mellitus with hyperglycemia: Secondary | ICD-10-CM | POA: Insufficient documentation

## 2019-06-07 MED ORDER — TRAMADOL HCL 50 MG PO TABS: 50.0000 mg | ORAL_TABLET | Freq: Every day | ORAL | 1 refills | Status: DC

## 2019-06-07 MED FILL — traMADol HCL 50 MG TABS: 50 | 30 days supply | Qty: 30 | Fill #0

## 2019-06-07 NOTE — Progress Notes (Signed)
Shoulder injection Right glenohumeral  With  ultrasound guidance)  Indication:Right Shoulder pain not relieved by medication management and other conservative care.  Informed consent was obtained after describing risks and benefits of the procedure with the patient, this includes bleeding, bruising, infection and medication side effects. The patient wishes to proceed and has given written consent. Patient was placed in a seated position. The RIght shoulder was marked and prepped with betadine in the subacromial area. A 25-gauge 1-1/2 inch needle was inserted into the subacromial area. After negative draw back for blood, a solution containing 1 mL of 6 mg per ML betamethasone and 4 mL of 1%Marcaine was injected. A band aid was applied. The patient tolerated the procedure well. Post procedure instructions were given.  If this injection only gives short-term or incomplete relief of his symptoms, may need to consider other potential treatments including stellate ganglion block versus suprascapular nerve block In the meantime we will continue tramadol 50 mg 1 or 2/day as needed

## 2019-06-07 NOTE — Telephone Encounter (Signed)
Spoke with daughter and an appointment scheduled for 06/08/19.

## 2019-06-08 ENCOUNTER — Ambulatory Visit (INDEPENDENT_AMBULATORY_CARE_PROVIDER_SITE_OTHER): Payer: Self-pay | Admitting: Internal Medicine

## 2019-06-08 ENCOUNTER — Encounter: Payer: Self-pay | Admitting: Internal Medicine

## 2019-06-08 VITALS — BP 110/70 | HR 97 | Temp 98.0°F | Wt 158.3 lb

## 2019-06-08 DIAGNOSIS — I6350 Cerebral infarction due to unspecified occlusion or stenosis of unspecified cerebral artery: Secondary | ICD-10-CM

## 2019-06-08 DIAGNOSIS — I639 Cerebral infarction, unspecified: Secondary | ICD-10-CM

## 2019-06-08 DIAGNOSIS — E1165 Type 2 diabetes mellitus with hyperglycemia: Secondary | ICD-10-CM

## 2019-06-08 DIAGNOSIS — M5431 Sciatica, right side: Secondary | ICD-10-CM

## 2019-06-08 DIAGNOSIS — E7849 Other hyperlipidemia: Secondary | ICD-10-CM

## 2019-06-08 LAB — POCT GLYCOSYLATED HEMOGLOBIN (HGB A1C): Hemoglobin A1C: 9.4 % — AB (ref 4.0–5.6)

## 2019-06-08 MED ORDER — ATORVASTATIN CALCIUM 80 MG PO TABS: 80.0000 mg | ORAL_TABLET | Freq: Every day | ORAL | 1 refills | Status: DC

## 2019-06-08 MED FILL — ATORVASTATIN 80 MG TABLET: 80 | 90 days supply | Qty: 90 | Fill #0

## 2019-06-08 NOTE — Progress Notes (Signed)
Established Patient Office Visit     This visit occurred during the SARS-CoV-2 public health emergency.  Safety protocols were in place, including screening questions prior to the visit, additional usage of staff PPE, and extensive cleaning of exam room while observing appropriate contact time as indicated for disinfecting solutions.    CC/Reason for Visit: 14-monthfollow-up chronic medical conditions  HPI: Arthur GLASSCOis a 54y.o. male who is coming in today for the above mentioned reasons. 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  He just received an injection to his right shoulder yesterday.  Feels improved in this regards.  He has been having pain in his right lower back, buttocks and posterior and lateral thigh.  It is worse after sitting down for prolonged periods of time.  He has been taking his Metformin daily.     Past Medical/Surgical History: Past Medical History:  Diagnosis Date  . Diabetes mellitus without complication ([place] [id number] . Hyperlipidemia   . Stroke ([facility] 05/06/2018   R sided weakness    Past Surgical History:  Procedure Laterality Date  . ANKLE SURGERY      Social History:  reports that he has been smoking cigarettes. He has never used smokeless tobacco. He reports that he does not drink alcohol or use drugs.  Allergies: No Known Allergies  Family History:  Family History  Problem Relation Age of Onset  . Heart disease Mother   . Lung cancer Father   . Diabetes Sister   . Diabetes Brother   . CVA Neg Hx   . Colon cancer Neg Hx   . Colon polyps Neg Hx   . Esophageal cancer Neg Hx   . Rectal cancer Neg Hx   . Stomach cancer Neg Hx      Current Outpatient Medications:  .   acetaminophen (TYLENOL) 325 MG tablet, Take 2 tablets (650 mg total) by mouth every 4 (four) hours as needed for mild pain (or temp > 37.5 C (99.5 F))., Disp: , Rfl:  .  aspirin EC 81 MG EC tablet, Take 1 tablet (81 mg total) by mouth daily., Disp: , Rfl:  .  atorvastatin (LIPITOR) 80 MG tablet, Take 1 tablet (80 mg total) by mouth daily., Disp: 90 tablet, Rfl: 1 .  blood glucose meter kit and supplies KIT, Dispense based on patient and insurance preference. Use up to four times daily as directed. (FOR ICD-9 250.00, 250.01)., Disp: 1 each, Rfl: 0 .  gabapentin (NEURONTIN) 300 MG capsule, Take 1 capsule in AM, 1 capsule afternoon and 2 capsules at night for 1 week, then 2 capsules in AM, 1 capsule afternoon and 2 capsules at night for 1 week, then 2 capsules three times daily., Disp: 180 capsule, Rfl: 0 .  glipiZIDE (GLUCOTROL) 10 MG tablet, Take 1 tablet (10 mg total) by mouth 2 (two) times daily before a meal., Disp: 180 tablet, Rfl: 1 .  traMADol (ULTRAM) 50 MG tablet, Take 1 tablet (50 mg total) by mouth at bedtime., Disp: 30 tablet, Rfl: 1 .  metFORMIN (GLUCOPHAGE) 1000 MG tablet, Take 1 tablet (1,000 mg total) by mouth 2 (two) times daily with a meal. (Patient not taking: Reported on 06/08/2019), Disp: 180 tablet, Rfl: 1  Review  of Systems:  Constitutional: Denies fever, chills, diaphoresis, appetite change and fatigue.  HEENT: Denies photophobia, eye pain, redness, hearing loss, ear pain, congestion, sore throat, rhinorrhea, sneezing, mouth sores, trouble swallowing, neck pain, neck stiffness and tinnitus.   Respiratory: Denies SOB, DOE, cough, chest tightness,  and wheezing.   Cardiovascular: Denies chest pain, palpitations and leg swelling.  Gastrointestinal: Denies nausea, vomiting, abdominal pain, diarrhea, constipation, blood in stool and abdominal distention.  Genitourinary: Denies dysuria, urgency, frequency, hematuria, flank pain and difficulty urinating.  Endocrine: Denies: hot or cold  intolerance, sweats, changes in hair or nails, polyuria, polydipsia. Musculoskeletal: Denies joint swelling. Skin: Denies pallor, rash and wound.  Neurological: Denies dizziness, seizures, syncope, weakness, light-headedness, numbness and headaches.  Hematological: Denies adenopathy. Easy bruising, personal or family bleeding history  Psychiatric/Behavioral: Denies suicidal ideation, mood changes, confusion, nervousness, sleep disturbance and agitation    Physical Exam: Vitals:   06/08/19 1027  BP: 110/70  Pulse: 97  Temp: 98 F (36.7 C)  TempSrc: Temporal  SpO2: 97%  Weight: 158 lb 4.8 oz (71.8 kg)    Body mass index is 28.95 kg/m.   Constitutional: NAD, calm, comfortable Eyes: PERRL, lids and conjunctivae normal ENMT: Mucous membranes are moist.  Back: Slight tenderness to palpation of the right paraspinal muscles at the area of the lower back, no swelling, negative straight leg raise Psychiatric: Normal judgment and insight. Alert and oriented x 3. Normal mood.    Impression and Plan:  Uncontrolled type 2 diabetes mellitus with hyperglycemia ([place])  - Plan: POCT glycosylated hemoglobin (Hb A1C), Microalbumin / creatinine urine ratio -A1c in office today is 9.4 which is improved from 11.7 in December. -He agrees to try Metformin twice daily, will return in 3 months.  If we are not able to get his A1c below 7 we will add Trulicity/Ozempic to his regimen.  Other hyperlipidemia  - Plan: atorvastatin (LIPITOR) 80 MG tablet -Last LDL was 105 which is still not at goal despite maximal atorvastatin therapy. -I will refer him to the lipid clinic.  Right sided sciatica -Icing, local massage therapy, back exercises have been provided today.  Left pontine cerebrovascular accident [facility]) -Noted    Patient Instructions  -Nice seeing you today!!  -Tomese el metformin 2 veces al dia.  -Haga los ejercicios para la espalda todos los dias.  -Regrese en 3  meses.   Citica Sciatica  La citica es el dolor, debilidad, hormigueo o prdida de la sensibilidad (adormecimiento) a lo largo del nervio citico. El nervio citico comienza en la parte inferior de la espalda y desciende por la parte posterior de cada pierna. Suele desaparecer por s sola o con tratamiento. A veces, la citica puede volver a aparecer (ser recurrente). Cules son las causas? Esta afeccin se produce cuando el nervio citico se comprime o se ejerce presin sobre l. Esto puede ser el resultado de:  Un disco que sobresale demasiado entre los huesos de la columna vertebral (hernia de disco).  Los cambios que se producen [employer] discos vertebrales al Veterinary surgeon.  Una afeccin en un msculo de las nalgas.  Un crecimiento seo adicional cerca del nervio citico.  Una rotura (fractura) de la zona que est entre los huesos de la cadera (pelvis).  Embarazo.  Tumor. Esto es poco frecuente. Qu incrementa el riesgo? Es ms probable que tengan esta afeccin las personas que:  Therapist, occupational deportes que ponen presin o tensin sobre la columna vertebral.  Tienen poca fuerza y facilidad de movimiento (flexibilidad).  Han tenido una lesin en la espalda en el pasado.  Han tenido una ciruga en la espalda.  Permanecen sentadas durante largos perodos.  Realizan actividades que implican agacharse o levantar objetos una y [place].  Tienen mucho sobrepeso (es obeso). Cules son los signos o los sntomas? Los sntomas pueden variar de leves a muy graves. Pueden incluir los siguientes:  Cualquiera de los siguientes problemas en la parte inferior de la espalda, piernas, cadera o nalgas: ? Hormigueo leve, prdida de la sensibilidad o dolor sordo. ? Sensacin de ardor. ? Dolor agudo.  Prdida de la sensibilidad en la parte posterior de la pantorrilla o la planta del pie.  Debilidad en las piernas.  Dolor muy intenso en la espalda que dificulta el movimiento. Estos sntomas  pueden empeorar al toser, Brewing technologist o rer. Tambin pueden empeorar al sentarse o estar de pie durante largos perodos. Cmo se trata? A menudo, esta afeccin mejora sin tratamiento. Sin embargo, el tratamiento puede incluir:  Quarry manager de [place] fsica o reducirla cuando siente dolor.  Hacer ejercicios y estiramientos.  Aplicar hielo o calor sobre la zona afectada.  Medicamentos para lo siguiente: ? Aliviar el dolor y la inflamacin. ? Relajar los msculos.  Inyecciones de medicamentos que ayudan a Best boy, la irritacin y la hinchazn.  Ciruga. Siga estas instrucciones en su casa: Medicamentos  [employer] de venta libre y los recetados solamente como se lo haya indicado el mdico.  Consulte a su mdico si el medicamento que le recetaron: ? Hace que sea necesario que evite conducir o usar maquinaria pesada. ? Puede causarle dificultad para defecar (estreimiento). Es posible que deba tomar estas medidas para prevenir o tratar los problemas para defecar:  Electronics engineer suficiente lquido para Contractor pis (la orina) de color amarillo plido.  Tomar medicamentos recetados o de [employer].  Comer alimentos ricos en fibra. Entre ellos, frijoles, cereales integrales y frutas y verduras frescas.  Limitar los alimentos con alto contenido de grasa y Location manager. Estos incluyen alimentos fritos o dulces. Control del dolor      Si se lo indican, aplique hielo en la zona afectada. ? Ponga el hielo en una bolsa plstica. ? Coloque una [employer] piel y Therapist, nutritional. ? Coloque el hielo durante 84mnutos, 2 a 3veces por da.  Si se lo indican, aplique calor en la zona afectada. Use la fuente de calor que el mdico le indique, por ejemplo, una compresa de calor hmedo o una almohadilla trmica. ? Coloque una tGenuine Partspiel y la fuente de cFreight forwarder ? Aplique calor durante 20 a 386mutos. ? Retire la fuente de calor si la piel se pone de color rojo brillante. Esto  es muy importante si no puede seEducation officer, environmentalcalor o fro. Puede correr un riesgo mayor de sufrir quemaduras. Actividad   Retome sus actividades habituales como se lo haya indicado el mdico. Pregntele al mdico qu actividades son seguras para usted.  Evite las acThe Northwestern Mutualntomas.  Descanse por breves perodos duAgricultural consultant? Cuando descanse durante perodos ms largos, haga alguna actividad fsica o un estiramiento entre los perodos de descanso. ? Evite estar sentado durante largos perodos sin moverse. Levntese y [place] menos una vez cada hora.  Haga ejercicios y estrese con regularidad, como se lo indic el mdico.  No levante nada que pese ms de 10libras (4.5kg) mientras tenga sntomas de citica. ? Aunque no tenga sntomas, evite levantar objetos pesados. ? EvEvent organiser  objetos pesados de forma repetida.  Al levantar objetos, hgalo siempre de una forma que sea segura para su cuerpo. Para esto, debe hacer lo siguiente: ? [place]. ? Mantenga el objeto cerca del cuerpo. ? No gire el cuerpo. Instrucciones generales  Mantenga un peso saludable.  Use calzado cmodo, que le d soporte al pie. Evite usar tacones.  Evite dormir sobre un colchn que sea demasiado blando o demasiado duro. Es posible que sienta menos dolor si duerme en un colchn con apoyo suficientemente firme para la espalda.  Concurra a todas las visitas de seguimiento como se lo haya indicado el mdico. Esto es importante. Comunquese con un mdico si:  Tiene un dolor con estas caractersticas: ? Lo despierta cuando est dormido. ? Empeora al estar recostado. ? Es Event organiser que tena en el pasado. ? Dura ms de 4semanas.  Pierde peso sin proponrselo. Solicite ayuda inmediatamente si:  No puede controlar la orina (miccin) ni la evacuacin de la materia fecal (defecacin).  Tiene debilidad en alguna de estas zonas, y la debilidad empeora: ? La parte  inferior de la espalda. ? La zona que se encuentra entre los [employer] caderas. ? Las nalgas. ? Las piernas.  Siente irritacin o inflamacin en la espalda.  Tiene sensacin de ardor al [employer]. Resumen  La citica es el dolor, debilidad, hormigueo o prdida de la sensibilidad (adormecimiento) a lo largo del nervio citico.  Esta afeccin se produce cuando el nervio citico se comprime o se ejerce presin sobre l.  La citica puede Engineer, drilling, hormigueo o prdida de la sensibilidad (adormecimiento) en la parte inferior de la espalda, las piernas, las caderas y las nalgas.  El tratamiento a menudo incluye reposo, ejercicio, medicamentos y Midwife hielo o calor en la zona afectada. Esta informacin no tiene Marine scientist el consejo del mdico. Asegrese de hacerle al mdico cualquier pregunta que tenga. Document Revised: 05/05/2018 Document Reviewed: 05/05/2018 Elsevier Patient Education  2020 [place], MD [facility]

## 2019-06-08 NOTE — Patient Instructions (Signed)
-Nice seeing you today!!  -Tomese el metformin 2 veces al dia.  -Haga los ejercicios para la espalda todos los dias.  -Regrese en 3 meses.   Citica Sciatica  La citica es el dolor, debilidad, hormigueo o prdida de la sensibilidad (adormecimiento) a lo largo del nervio citico. El nervio citico comienza en la parte inferior de la espalda y desciende por la parte posterior de cada pierna. Suele desaparecer por s sola o con tratamiento. A veces, la citica puede volver a aparecer (ser recurrente). Cules son las causas? Esta afeccin se produce cuando el nervio citico se comprime o se ejerce presin sobre l. Esto puede ser el resultado de:  Un disco que sobresale demasiado entre los huesos de la columna vertebral (hernia de disco).  Los cambios que se producen [employer] discos vertebrales al Veterinary surgeon.  Una afeccin en un msculo de las nalgas.  Un crecimiento seo adicional cerca del nervio citico.  Una rotura (fractura) de la zona que est entre los huesos de la cadera (pelvis).  Embarazo.  Tumor. Esto es poco frecuente. Qu incrementa el riesgo? Es ms probable que tengan esta afeccin las personas que:  Therapist, occupational deportes que ponen presin o tensin sobre la columna vertebral.  Tienen poca fuerza y facilidad de movimiento (flexibilidad).  Han tenido una lesin en la espalda en el pasado.  Han tenido una ciruga en la espalda.  Permanecen sentadas durante largos perodos.  Realizan actividades que implican agacharse o levantar objetos una y [place].  Tienen mucho sobrepeso (es obeso). Cules son los signos o los sntomas? Los sntomas pueden variar de leves a muy graves. Pueden incluir los siguientes:  Cualquiera de los siguientes problemas en la parte inferior de la espalda, piernas, cadera o nalgas: ? Hormigueo leve, prdida de la sensibilidad o dolor sordo. ? Sensacin de ardor. ? Dolor agudo.  Prdida de la sensibilidad en la parte posterior de la  pantorrilla o la planta del pie.  Debilidad en las piernas.  Dolor muy intenso en la espalda que dificulta el movimiento. Estos sntomas pueden empeorar al toser, Brewing technologist o rer. Tambin pueden empeorar al sentarse o estar de pie durante largos perodos. Cmo se trata? A menudo, esta afeccin mejora sin tratamiento. Sin embargo, el tratamiento puede incluir:  Quarry manager de [place] fsica o reducirla cuando siente dolor.  Hacer ejercicios y estiramientos.  Aplicar hielo o calor sobre la zona afectada.  Medicamentos para lo siguiente: ? Aliviar el dolor y la inflamacin. ? Relajar los msculos.  Inyecciones de medicamentos que ayudan a Best boy, la irritacin y la hinchazn.  Ciruga. Siga estas instrucciones en su casa: Medicamentos  [employer] de venta libre y los recetados solamente como se lo haya indicado el mdico.  Consulte a su mdico si el medicamento que le recetaron: ? Hace que sea necesario que evite conducir o usar maquinaria pesada. ? Puede causarle dificultad para defecar (estreimiento). Es posible que deba tomar estas medidas para prevenir o tratar los problemas para defecar:  Electronics engineer suficiente lquido para Contractor pis (la orina) de color amarillo plido.  Tomar medicamentos recetados o de [employer].  Comer alimentos ricos en fibra. Entre ellos, frijoles, cereales integrales y frutas y verduras frescas.  Limitar los alimentos con alto contenido de grasa y Location manager. Estos incluyen alimentos fritos o dulces. Control del dolor      Si se lo indican, aplique hielo en la zona afectada. ? Ponga el hielo en una bolsa plstica. ? Coloque una  toalla entre la piel y la bolsa. ? Coloque el hielo durante 72minutos, 2 a 3veces por da.  Si se lo indican, aplique calor en la zona afectada. Use la fuente de calor que el mdico le indique, por ejemplo, una compresa de calor hmedo o una almohadilla trmica. ? Coloque una [employer] piel  y la fuente de Freight forwarder. ? Aplique calor durante 20 a 14minutos. ? Retire la fuente de calor si la piel se pone de color rojo brillante. Esto es muy importante si no puede Education officer, environmental, calor o fro. Puede correr un riesgo mayor de sufrir quemaduras. Actividad   Retome sus actividades habituales como se lo haya indicado el mdico. Pregntele al mdico qu actividades son seguras para usted.  Evite las [employer] sntomas.  Descanse por breves perodos Agricultural consultant. ? Cuando descanse durante perodos ms largos, haga alguna actividad fsica o un estiramiento entre los perodos de descanso. ? Evite estar sentado durante largos perodos sin moverse. Levntese y [place] al menos una vez cada hora.  Haga ejercicios y estrese con regularidad, como se lo indic el mdico.  No levante nada que pese ms de 10libras (4.5kg) mientras tenga sntomas de citica. ? Aunque no tenga sntomas, evite levantar objetos pesados. ? Evite levantar objetos pesados de forma repetida.  Al levantar objetos, hgalo siempre de una forma que sea segura para su cuerpo. Para esto, debe hacer lo siguiente: ? [place]. ? Mantenga el objeto cerca del cuerpo. ? No gire el cuerpo. Instrucciones generales  Mantenga un peso saludable.  Use calzado cmodo, que le d soporte al pie. Evite usar tacones.  Evite dormir sobre un colchn que sea demasiado blando o demasiado duro. Es posible que sienta menos dolor si duerme en un colchn con apoyo suficientemente firme para la espalda.  Concurra a todas las visitas de seguimiento como se lo haya indicado el mdico. Esto es importante. Comunquese con un mdico si:  Tiene un dolor con estas caractersticas: ? Lo despierta cuando est dormido. ? Empeora al estar recostado. ? Es Event organiser que tena en el pasado. ? Dura ms de 4semanas.  Pierde peso sin proponrselo. Solicite ayuda inmediatamente si:  No puede controlar la orina  (miccin) ni la evacuacin de la materia fecal (defecacin).  Tiene debilidad en alguna de estas zonas, y la debilidad empeora: ? La parte inferior de la espalda. ? La zona que se encuentra entre los [employer] caderas. ? Las nalgas. ? Las piernas.  Siente irritacin o inflamacin en la espalda.  Tiene sensacin de ardor al [employer]. Resumen  La citica es el dolor, debilidad, hormigueo o prdida de la sensibilidad (adormecimiento) a lo largo del nervio citico.  Esta afeccin se produce cuando el nervio citico se comprime o se ejerce presin sobre l.  La citica puede Engineer, drilling, hormigueo o prdida de la sensibilidad (adormecimiento) en la parte inferior de la espalda, las piernas, las caderas y las nalgas.  El tratamiento a menudo incluye reposo, ejercicio, medicamentos y Midwife hielo o calor en la zona afectada. Esta informacin no tiene Marine scientist el consejo del mdico. Asegrese de hacerle al mdico cualquier pregunta que tenga. Document Revised: 05/05/2018 Document Reviewed: 05/05/2018 Elsevier Patient Education  [place].

## 2019-06-13 ENCOUNTER — Telehealth: Payer: Self-pay | Admitting: Gastroenterology

## 2019-06-13 NOTE — Telephone Encounter (Signed)
Left message for pt's daughter to call back.

## 2019-06-13 NOTE — Telephone Encounter (Signed)
Received CT report from [facility] medical [facility] facility.  Exam date June 10, 2019.  Conclusion "within the limitations due to motion artifact, the appendix appears normal in caliber without periappendiceal inflammatory changes.  No focal lesions identified"     Can you please let him know that the protuberant appendix appears normal on CT scan.  My biopsies from that during his colonoscopy showed only edema.  He should call if he has significant right lower quadrant pains otherwise no further testing needs to be done for this.  Thanks

## 2019-06-14 ENCOUNTER — Ambulatory Visit: Payer: Commercial Indemnity | Admitting: Physical Medicine & Rehabilitation

## 2019-06-14 NOTE — Telephone Encounter (Signed)
Spoke with pts daughter and she is aware. 

## 2019-06-28 ENCOUNTER — Ambulatory Visit: Payer: Commercial Indemnity | Admitting: Interventional Cardiology

## 2019-07-15 ENCOUNTER — Other Ambulatory Visit: Payer: Self-pay | Admitting: Internal Medicine

## 2019-07-15 DIAGNOSIS — E1165 Type 2 diabetes mellitus with hyperglycemia: Secondary | ICD-10-CM

## 2019-07-18 ENCOUNTER — Encounter: Payer: Self-pay | Admitting: Internal Medicine

## 2019-07-18 DIAGNOSIS — E1165 Type 2 diabetes mellitus with hyperglycemia: Secondary | ICD-10-CM

## 2019-07-19 ENCOUNTER — Encounter: Payer: Self-pay | Admitting: Physical Medicine & Rehabilitation

## 2019-07-19 ENCOUNTER — Encounter: Payer: Self-pay | Attending: Registered Nurse | Admitting: Physical Medicine & Rehabilitation

## 2019-07-19 ENCOUNTER — Other Ambulatory Visit: Payer: Self-pay

## 2019-07-19 VITALS — BP 156/82 | HR 64 | Temp 98.7°F | Ht 62.0 in | Wt 156.8 lb

## 2019-07-19 DIAGNOSIS — E1165 Type 2 diabetes mellitus with hyperglycemia: Secondary | ICD-10-CM | POA: Insufficient documentation

## 2019-07-19 DIAGNOSIS — I639 Cerebral infarction, unspecified: Secondary | ICD-10-CM

## 2019-07-19 DIAGNOSIS — I6350 Cerebral infarction due to unspecified occlusion or stenosis of unspecified cerebral artery: Secondary | ICD-10-CM | POA: Insufficient documentation

## 2019-07-19 DIAGNOSIS — I69351 Hemiplegia and hemiparesis following cerebral infarction affecting right dominant side: Secondary | ICD-10-CM

## 2019-07-19 DIAGNOSIS — E7849 Other hyperlipidemia: Secondary | ICD-10-CM | POA: Insufficient documentation

## 2019-07-19 MED ORDER — METFORMIN HCL 1000 MG PO TABS: ORAL_TABLET | ORAL | 1 refills | Status: DC

## 2019-07-19 MED FILL — metFORMIN HCL 1000 MG TABS: 1000 | 90 days supply | Qty: 180 | Fill #0

## 2019-07-19 NOTE — Progress Notes (Signed)
Subjective:    Patient ID: Arthur Howard, male    DOB: 1965-10-18, 54 y.o.   MRN: [id number] Patient primarily Spanish-speaking, [place] language interpreter is present in room HPI 54 year old male with history of tobacco abuse and diabetes who developed right-sided weakness and facial droop on 04/26/2018. 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  The patient has had right hemiplegic shoulder pain that has responded quite well to glenohumeral injections performed under ultrasound guidance.  He was diagnosed with adhesive capsulitis.  He still has limited range of motion but pain has improved greatly.  He is no longer requiring tramadol  In terms of right lower extremity he does get some cramps in the back of his thigh mainly when he first stands up.  No pain in the back.  No new weakness in the right lower extremity Pain Inventory Average Pain 0 Pain Right Now 0 My pain is na  In the last 24 hours, has pain interfered with the following? General activity 3 Relation with others 4 Enjoyment of life 7 What TIME of day is your pain at its worst? na Sleep (in general) Good  Pain is worse with: na Pain improves with: na Relief from Meds: na  Mobility how many minutes can you walk? 30-40 ability to climb steps?  yes do you drive?  yes  Function Do you have any goals in this area?  no  Neuro/Psych No problems in this area  Prior Studies Any changes since last visit?  no  Physicians involved in your care Any changes since last visit?  no   Family History  Problem Relation Age of Onset  . Heart disease Mother   . Lung cancer Father   . Diabetes Sister   . Diabetes Brother   . CVA Neg Hx   . Colon cancer Neg Hx   . Colon polyps Neg Hx   . Esophageal cancer Neg Hx   . Rectal cancer Neg Hx   . Stomach cancer Neg Hx    Social History   Socioeconomic History  . Marital status: Married    Spouse name: Verdis Frederickson  . Number of children: 7  . Years of  education: Not on file  . Highest education level: 6th grade  Occupational History  . Occupation: Sales executive    Comment: [employer]  Tobacco Use  . Smoking status: Light Tobacco Smoker    Types: Cigarettes    Last attempt to quit: 05/13/2018    Years since quitting: 1.1  . Smokeless tobacco: Never Used  . Tobacco comment: never a daily smoker  Substance and Sexual Activity  . Alcohol use: No    Alcohol/week: 0.0 standard drinks  . Drug use: No  . Sexual activity: Not on file  Other Topics Concern  . Not on file  Social History Narrative   Works with Sales executive. Lives in [place]. Moved from [place] 35 years ago. He is married. Has 7 children (all girls).       Patient is right-handed. He lives with his wife in a one level home. He walks daily for exercise.   Social Determinants of Health   Financial Resource Strain:   . Difficulty of Paying Living Expenses:   Food Insecurity:   . Worried About Charity fundraiser in the Last Year:   . Arboriculturist in the Last Year:   Transportation Needs:   . Film/video editor (Medical):   Marland Kitchen Lack of Transportation (Non-Medical):   Physical Activity:   .  Days of Exercise per Week:   . Minutes of Exercise per Session:   Stress:   . Feeling of Stress :   Social Connections:   . Frequency of Communication with Friends and Family:   . Frequency of Social Gatherings with Friends and Family:   . Attends Religious Services:   . Active Member of Clubs or Organizations:   . Attends Archivist Meetings:   Marland Kitchen Marital Status:    Past Surgical History:  Procedure Laterality Date  . ANKLE SURGERY     Past Medical History:  Diagnosis Date  . Diabetes mellitus without complication ([place]) 123456  . Hyperlipidemia   . Stroke ([place]) 05/06/2018   R sided weakness   BP (!) 156/82   Pulse 64   Temp 98.7 F (37.1 C)   Ht 5\' 2"  (1.575 m)   Wt 156 lb 12.8 oz (71.1 kg)   SpO2 97%   BMI 28.68 kg/m   Opioid Risk Score:   Fall Risk Score:   `1  Depression screen PHQ 2/9  Depression screen [facility] 2/9 08/19/2018 08/02/2018 07/05/2018 05/26/2018 05/30/2017 09/20/2016 09/18/2016  Decreased Interest 1 1 0 0 0 0 0  Down, Depressed, Hopeless 1 1 0 0 0 0 0  PHQ - 2 Score 2 2 0 0 0 0 0    Review of Systems  All other systems reviewed and are negative.      Objective:   Physical Exam Vitals and nursing note reviewed.  Constitutional:      Appearance: Normal appearance.  HENT:     Head: Normocephalic and atraumatic.  Eyes:     Extraocular Movements: Extraocular movements intact.     Conjunctiva/sclera: Conjunctivae normal.     Pupils: Pupils are equal, round, and reactive to light.  Musculoskeletal:     Comments: Right mid belly hamstring pain with stretching, extension of the knee.  No evidence of right knee effusion.  Neurological:     Mental Status: He is alert and oriented to person, place, and time. Mental status is at baseline.     Comments: Motor strength is 3 - at the right deltoid 4 at the biceps 3 at the triceps 4 - at finger flexors and extensors More at the right hip flexor knee extensor ankle dorsiflexor   Psychiatric:        Mood and Affect: Mood normal.        Behavior: Behavior normal.           Assessment & Plan:  #1.  Postop shoulder pain right side history of adhesive capsulitis improved after ultrasound-guided injection.  We discussed that these injections need to be done at least 3 months apart.  It is difficult to predict the exact duration of response.  The patient feels comfortable calling me back if his pain recurs.  He is to continue his home exercise program.  He does have tramadol at home but is currently not taking this. 2.  Increased tone right hamstring discussed stretching I printed up instructions for him. This was discussed with the patient in the presence of the Spanish language interpreter

## 2019-07-19 NOTE — Patient Instructions (Signed)
Rehabilitacin para la distensin de los isquiotibiales Hamstring Strain Rehab Pregunte al mdico qu ejercicios son seguros para usted. Haga los ejercicios exactamente como se lo haya indicado el mdico y gradelos como se lo hayan indicado. Es normal sentir un estiramiento leve, tironeo, opresin o Tree surgeon al [employer] [place]. Detngase de inmediato si siente un dolor repentino o Printmaker. No comience a hacer estos ejercicios hasta que se lo indique el mdico. Ejercicios de elongacin y amplitud de movimiento Estos ejercicios precalientan los msculos y las articulaciones, y [place] y la flexibilidad de los muslos. Estos ejercicios tambin ayudan a Best boy, el adormecimiento y el hormigueo. Hable con el mdico sobre estas restricciones. Extensin de rodilla, sentado  1. Sintese con el taln izquierdo/derecho apoyado en una silla, mesita o taburete para los pies. No tenga nada debajo de la rodilla para sostenerla. 2. Relaje los msculos de la pierna, dejando que la gravedad extienda la rodilla (extensin). Debe sentir un estiramiento detrs de la rodilla izquierda/derecha. 3. Si el mdico lo autoriza, puede Animal nutritionist de __________ [employer], justo encima de la rtula, para lograr un estiramiento ms profundo. 4. Mantenga esta posicin durante __________ segundos. Repita __________ veces. Realice este ejercicio __________ veces al da. Estiramiento sentado A veces, este ejercicio se denomina estiramiento de isquiotibiales y aductores. 1. Sintese en el piso con las piernas abiertas y estiradas. Mantenga las rodillas estiradas durante este ejercicio. 2. Con la cabeza y la espalda en lnea recta, doble la cintura para llegar hasta el pie izquierdo (posicinA). Debe sentir un estiramiento en la parte interna del muslo derecho (aductores). 3. Mantenga esta posicin durante __________ segundos. Vuelva lentamente a la posicin recta. 4. Con la cabeza y la  espalda en lnea recta, doble la cintura para estirarse hacia adelante (posicinB). Debe sentir un estiramiento en la parte posterior de los muslos o las rodillas (isquiotibiales). 5. Mantenga esta posicin durante __________ segundos. Vuelva lentamente a la posicin recta. 6. Con la cabeza y la espalda en lnea recta, doble la cintura para llegar hasta el pie derecho (posicinC). Debe sentir un estiramiento en la parte interna del muslo izquierdo (aductores). 7. Mantenga esta posicin durante __________ segundos. Vuelva lentamente a la posicin recta. Repita __________ veces. Realice este ejercicio __________ veces al da. Estiramiento de isquiotibiales, en decbito supino  1. Acustese boca arriba (posicin supina). 2. Ate un cinturn o una toalla en la regin metatarsiana del pie izquierdo/derecho. La regin metatarsiana del pie es la superficie sobre la que caminamos, justo debajo de los dedos. 3. Estire la rodilla izquierda/derecha y, lentamente, tire del cinturn o la toalla para levantar la pierna. ? No flexione la rodilla izquierda/derecha mientras realiza este ejercicio. ? Mantenga la otra pierna estirada contra el suelo. ? Levante la pierna izquierda/derecha hasta sentir un leve estiramiento detrs de la rodilla o el muslo izquierdo/derecho (isquiotibiales). 4. Mantenga esta posicin durante __________ segundos. 5. Regrese lentamente la pierna a la posicin inicial. Repita __________ veces. Realice este ejercicio __________ veces al da. Ejercicios de fortalecimiento Estos ejercicios fortalecen los muslos y les otorgan resistencia. La resistencia es la capacidad de usar los msculos durante un tiempo prolongado, incluso despus de que se cansen. Elevaciones con la pierna extendida, en decbito prono Este ejercicio fortalece los msculos que [employer] caderas (extensores de la cadera). 1. Recustese boca abajo sobre una superficie firme (posicin prona). 2. Tensione los msculos de  las nalgas para levantar la pierna izquierda/derecha unas 4pulgadas (10cm). Mantenga  la rodilla estirada mientras eleva la pierna. Si no llega a esta altura sin arquear la espalda, coloque una almohada debajo de la cadera. 3. Mantenga esta posicin durante __________ segundos. 4. Baje lentamente la pierna hasta la posicin inicial. 5. Relaje los msculos por completo antes de comenzar la siguiente repeticin. Repita __________ veces. Realice este ejercicio __________ veces al da. Puente Este ejercicio fortalece los msculos de las nalgas y la parte trasera de los muslos (extensores de la cadera). 1. Recustese boca arriba sobre una superficie firme con las rodillas flexionadas y los pies completamente apoyados en el suelo. 2. Tensione los msculos de las nalgas y levante la cadera del suelo hasta que el tronco quede a la altura de los muslos. ? Debe sentir el trabajo muscular en las nalgas y la parte de atrs de los muslos. ? No arquee la espalda. 3. Mantenga esta posicin durante __________ segundos. 4. Baje lentamente la cadera a la posicin inicial. 5. Relaje los msculos de las nalgas por completo entre repeticiones. 6. Si se lo indica el mdico, mantenga la pelvis levantada del suelo y aleje los pies lo ms que pueda, sin perder el control. Mantenga esta posicin durante __________ segundos y luego vuelva a acercar los pies. Repita __________ veces. Realice este ejercicio __________ veces al da. Caminata lateral con banda Este es un ejercicio en el que se camina hacia los lados (lateral), con tensin causada por una banda para ejercicios. El ejercicio fortalece los msculos de la cadera (abductores de la cadera). 1. Prese en un pasillo largo. 2. tese una banda para ejercicios alrededor [employer], justo por encima de las rodillas. 3. Flexione con [place] y lleve la cadera hacia abajo y [place], para que el peso est sobre los talones. 4. Desplcese hacia el  costado por todo el largo del pasillo, [place] los dedos del pie hacia adelante y [place] la banda tensionada. 5. Repita el ejercicio comenzando con la otra pierna. Repita __________ veces. Realice este ejercicio __________ veces al da. Pararse sobre una pierna con estiramiento Este ejercicio tambin se denomina estiramiento excntrico de los isquiotibiales. 1. Prese sobre el pie izquierdo/derecho. Sin despegar el dedo [place] del suelo, intente mantener el arco levantado. 2. Lentamente, agchese lo ms que pueda sin perder el equilibrio. Bajar el muslo bajo tensin se denomina estiramiento excntrico. 3. Mantenga esta posicin durante __________ segundos. Repita __________ veces. Realice este ejercicio __________ veces al da. Plancha, en decbito prono Este ejercicio fortalece los msculos del abdomen y la zona del torso. 1. Acustese boca abajo en el suelo (posicin prona)y apyese sobre los codos para levantarse. Las manos deben estar colocadas frente a usted y los codos debajo de los hombros. [employer] pies similares a una posicin hacia arriba para que los dedos del pie estn apoyados en el suelo. 2. Contraiga los msculos abdominales y eleve el cuerpo del suelo. ? No arquee la espalda. ? No contenga la respiracin. 3. Mantenga esta posicin durante __________ segundos. Repita __________ veces. Realice este ejercicio __________ veces al da. Esta informacin no tiene Marine scientist el consejo del mdico. Asegrese de hacerle al mdico cualquier pregunta que tenga. Document Revised: 04/08/2018 Document Reviewed: 04/08/2018 Elsevier Patient Education  [place].

## 2019-07-20 ENCOUNTER — Ambulatory Visit: Payer: Self-pay | Admitting: Interventional Cardiology

## 2019-07-28 ENCOUNTER — Ambulatory Visit: Payer: Medicaid Other | Attending: Internal Medicine

## 2019-07-28 DIAGNOSIS — Z23 Encounter for immunization: Secondary | ICD-10-CM

## 2019-07-28 NOTE — Progress Notes (Signed)
   Covid-19 Vaccination Clinic  Name:  Arthur Howard    MRN: [id number] DOB: [birth year]  07/28/2019  Arthur Howard was observed post Covid-19 immunization for 15 minutes without incident. He was provided with Vaccine Information Sheet and instruction to access the V-Safe system.   Arthur Howard was instructed to call 911 with any severe reactions post vaccine: Marland Kitchen Difficulty breathing  . Swelling of face and throat  . A fast heartbeat  . A bad rash all over body  . Dizziness and weakness   Immunizations Administered    Name Date Dose VIS Date Route   Pfizer COVID-19 Vaccine 07/28/2019 12:25 PM 0.3 mL 05/18/2018 Intramuscular   Manufacturer: [place]   Lot: [id number]   [place]: [id number]

## 2019-08-08 ENCOUNTER — Encounter: Payer: Self-pay | Admitting: General Practice

## 2019-08-11 ENCOUNTER — Ambulatory Visit: Payer: Self-pay | Admitting: Interventional Cardiology

## 2019-08-23 ENCOUNTER — Ambulatory Visit: Payer: Medicaid Other | Attending: Internal Medicine

## 2019-08-23 DIAGNOSIS — Z23 Encounter for immunization: Secondary | ICD-10-CM

## 2019-08-23 NOTE — Progress Notes (Signed)
   Covid-19 Vaccination Clinic  Name:  Arthur Howard    MRN: [id number] DOB: [birth year]  08/23/2019  [name] was observed post Covid-19 immunization for 15 minutes without incident. He was provided with Vaccine Information Sheet and instruction to access the V-Safe system.   [name] was instructed to call 911 with any severe reactions post vaccine: Marland Kitchen Difficulty breathing  . Swelling of face and throat  . A fast heartbeat  . A bad rash all over body  . Dizziness and weakness   Immunizations Administered    Name Date Dose VIS Date Route   Pfizer COVID-19 Vaccine 08/23/2019 12:38 PM 0.3 mL 05/18/2018 Intramuscular   Manufacturer: [place]   Lot: [id number]   [place]: [id number]

## 2019-08-23 NOTE — Progress Notes (Signed)
Cardiology Office Note:    Date:  08/25/2019   ID:  Arthur Howard, DOB 09-Nov-1965, MRN [id number]  PCP:  Isaac Bliss, Rayford Halsted, MD  Cardiologist:  Larae Grooms, MD   Referring MD: Isaac Bliss, Estel*   Chief Complaint  Patient presents with  . Coronary Artery Disease  . Chest Pain    History of Present Illness:    Arthur Howard is a 54 y.o. male with a hx of  CVA, hyperlipidemia, and DM II who is referred for atypical chest pain.  Her physician is Yevette Edwards.  Arthur Howard is accompanied by an interpreter, Barnie Del.  He has had a stroke in 2020.  He has occasional left lateral chest discomfort.  It occurs randomly.  It happens every several days averaging about once per week.  It can last minutes.  It is not consistently precipitated by activity.  He denies palpitations.  No episodes of syncope.  No significant tachycardia.  No family history of vascular disease.  He has had a prior left brain CVA.  Risk factors include poorly controlled diabetes, hyperlipidemia, but otherwise unremarkable.  He is disabled after stroke due to right hemiparesis.  Past Medical History:  Diagnosis Date  . Diabetes mellitus without complication ([place]) 3662  . Hyperlipidemia   . Stroke [facility]) 05/06/2018   R sided weakness    Past Surgical History:  Procedure Laterality Date  . ANKLE SURGERY      Current Medications: Current Meds  Medication Sig  . acetaminophen (TYLENOL) 325 MG tablet Take 2 tablets (650 mg total) by mouth every 4 (four) hours as needed for mild pain (or temp > 37.5 C (99.5 F)).  Marland Kitchen aspirin EC 81 MG EC tablet Take 1 tablet (81 mg total) by mouth daily.  Marland Kitchen atorvastatin (LIPITOR) 80 MG tablet Take 1 tablet (80 mg total) by mouth daily.  . blood glucose meter kit and supplies KIT Dispense based on patient and insurance preference. Use up to four times daily as directed. (FOR ICD-9 250.00, 250.01).  Marland Kitchen glipiZIDE (GLUCOTROL) 10 MG tablet Take 1 tablet  (10 mg total) by mouth 2 (two) times daily before a meal.  . metFORMIN (GLUCOPHAGE) 1000 MG tablet TAKE 1 TABLET (1,000 MG TOTAL) BY MOUTH 2 TIMES DAILY WITH A MEAL.  Marland Kitchen traMADol (ULTRAM) 50 MG tablet Take 1 tablet (50 mg total) by mouth at bedtime.     Allergies:   Patient has no known allergies.   Social History   Socioeconomic History  . Marital status: Married    Spouse name: Verdis Frederickson  . Number of children: 7  . Years of education: Not on file  . Highest education level: 6th grade  Occupational History  . Occupation: Sales executive    Comment: [employer]  Tobacco Use  . Smoking status: Former Smoker    Types: Cigarettes  . Smokeless tobacco: Never Used  . Tobacco comment: never a daily smoker  Substance and Sexual Activity  . Alcohol use: No    Alcohol/week: 0.0 standard drinks  . Drug use: No  . Sexual activity: Not on file  Other Topics Concern  . Not on file  Social History Narrative   Works with Sales executive. Lives in [place]. Moved from [place] 35 years ago. He is married. Has 7 children (all girls).       Patient is right-handed. He lives with his wife in a one level home. He walks daily for exercise.   Social Determinants of Health   Financial  Resource Strain:   . Difficulty of Paying Living Expenses:   Food Insecurity:   . Worried About Charity fundraiser in the Last Year:   . Arboriculturist in the Last Year:   Transportation Needs:   . Film/video editor (Medical):   Marland Kitchen Lack of Transportation (Non-Medical):   Physical Activity:   . Days of Exercise per Week:   . Minutes of Exercise per Session:   Stress:   . Feeling of Stress :   Social Connections:   . Frequency of Communication with Friends and Family:   . Frequency of Social Gatherings with Friends and Family:   . Attends Religious Services:   . Active Member of Clubs or Organizations:   . Attends Archivist Meetings:   Marland Kitchen Marital Status:      Family History: The patient's family history  includes Diabetes in his brother and sister; Heart disease in his mother; Lung cancer in his father. There is no history of CVA, Colon cancer, Colon polyps, Esophageal cancer, Rectal cancer, or Stomach cancer.  ROS:   Please see the history of present illness.    Diabetes control has not been great although he is now on therapy and has a primary care physician.  He is compliant with his current medical regimen.  He is experiencing no side effects from the medications.  All other systems reviewed and are negative.  EKGs/Labs/Other Studies Reviewed:    The following studies were reviewed today: CT neck 04/2018: Aortic arch: Very minimal atherosclerotic calcification. No meaningful atherosclerosis. No aneurysm or dissection. Branching pattern is normal.  EKG:  EKG normal sinus rhythm with normal EKG appearance.  Heart rate 63.  Recent Labs: 05/25/2019: BUN 11; Creatinine, Ser 0.89  Recent Lipid Panel    Component Value Date/Time   CHOL 183 03/22/2019 0925   CHOL 217 (H) 05/30/2017 1157   TRIG 181.0 (H) 03/22/2019 0925   HDL 41.60 03/22/2019 0925   HDL 47 05/30/2017 1157   CHOLHDL 4 03/22/2019 0925   VLDL 36.2 03/22/2019 0925   LDLCALC 105 (H) 03/22/2019 0925   LDLCALC 112 (H) 05/30/2017 1157    Physical Exam:    VS:  BP 120/68   Pulse 63   Ht _0  (1.575 m)   Wt 158 lb 6.4 oz (71.8 kg)   SpO2 98%   BMI 28.97 kg/m     Wt Readings from Last 3 Encounters:  08/25/19 158 lb 6.4 oz (71.8 kg)  07/19/19 156 lb 12.8 oz (71.1 kg)  06/08/19 158 lb 4.8 oz (71.8 kg)     GEN: Young appearing. No acute distress HEENT: Normal NECK: Right subclavicular bruit.  No JVD. LYMPHATICS: No lymphadenopathy CARDIAC:  RRR without murmur, gallop, or edema. VASCULAR:  Normal Pulses. No bruits. RESPIRATORY:  Clear to auscultation without rales, wheezing or rhonchi  ABDOMEN: Soft, non-tender, non-distended, No pulsatile mass, MUSCULOSKELETAL: No deformity  SKIN: Warm and dry NEUROLOGIC:  Alert  and oriented x 3 PSYCHIATRIC:  Normal affect   ASSESSMENT:    1. Cerebrovascular accident (CVA) due to thrombosis of precerebral artery ([place])   2. Other hyperlipidemia   3. Tobacco abuse   4. Uncontrolled type 2 diabetes mellitus with hyperglycemia ([place])   5. Educated about COVID-19 virus infection   6. Precordial pain    PLAN:    In order of problems listed above:  1. Data reviewed. 2. LDL target should be less than 70.  This was discussed with the  patient. 3. Denies tobacco abuse. 4. Discussed getting hemoglobin A1c less than 7 as the target. 5. COVID-19 vaccine has been received. 6. The precordial chest pain is atypical but in light of risk factors and persistence of complaints, coronary CTA will be performed to exclude significant obstructive CAD.  Overall education and awareness concerning /secondary risk prevention was discussed in detail: LDL less than 70, hemoglobin A1c less than 7, blood pressure target less than 130/80 mmHg, >150 minutes of moderate aerobic activity per week, avoidance of smoking, weight control (via diet and exercise), and continued surveillance/management of/for obstructive sleep apnea.   Medication Adjustments/Labs and Tests Ordered: Current medicines are reviewed at length with the patient today.  Concerns regarding medicines are outlined above.  Orders Placed This Encounter  Procedures  . CT CORONARY MORPH W/CTA COR W/SCORE W/CA W/CM &/OR WO/CM  . CT CORONARY FRACTIONAL FLOW RESERVE DATA PREP  . CT CORONARY FRACTIONAL FLOW RESERVE FLUID ANALYSIS  . Basic metabolic panel  . EKG 12-Lead   Meds ordered this encounter  Medications  . metoprolol tartrate (LOPRESSOR) 100 MG tablet    Sig: Take one tablet by mouth 2 hours prior to your CT    Dispense:  1 tablet    Refill:  0    Patient Instructions  Medication Instructions:  Your physician recommends that you continue on your current medications as directed. Please refer to the Current Medication  list given to you today.  *If you need a refill on your cardiac medications before your next appointment, please call your pharmacy*   Lab Work: None If you have labs (blood work) drawn today and your tests are completely normal, you will receive your results only by: Marland Kitchen MyChart Message (if you have MyChart) OR . A paper copy in the mail If you have any lab test that is abnormal or we need to change your treatment, we will call you to review the results.   Testing/Procedures: Your physician recommends that you have a Coronary CT performed.    Follow-Up: At [facility], you and your health needs are our priority.  As part of our continuing mission to provide you with exceptional heart care, we have created designated Provider Care Teams.  These Care Teams include your primary Cardiologist (physician) and Advanced Practice Providers (APPs -  Physician Assistants and Nurse Practitioners) who all work together to provide you with the care you need, when you need it.  We recommend signing up for the patient portal called "MyChart".  Sign up information is provided on this After Visit Summary.  MyChart is used to connect with patients for Virtual Visits (Telemedicine).  Patients are able to view lab/test results, encounter notes, upcoming appointments, etc.  Non-urgent messages can be sent to your provider as well.   To learn more about what you can do with MyChart, go to [email or web address].    Your next appointment:   As needed  The format for your next appointment:   In Person  Provider:   You may see Dr. Daneen Schick or one of the following Advanced Practice Providers on your designated Care Team:    Truitt Merle, NP  Cecilie Kicks, NP  Kathyrn Drown, NP    Other Instructions  Your cardiac CT will be scheduled at one of the below locations:   [facility] [address] [place], [place] [address] [phone]  [place] [address] [place], [place] [address] [phone]  If scheduled at [facility], please arrive at the [facility] main entrance of [facility] 30 minutes prior to test start time. Proceed to the [facility] Radiology Department (first floor) to check-in and test prep.  If scheduled at [facility], please arrive 15 mins early for check-in and test prep.  Please follow these instructions carefully (unless otherwise directed):  Hold all erectile dysfunction medications at least 3 days (72 hrs) prior to test.  On the Night Before the Test: . Be sure to Drink plenty of water. . Do not consume any caffeinated/decaffeinated beverages or chocolate 12 hours prior to your test. . Do not take any antihistamines 12 hours prior to your test. . If you take Metformin do not take 24 hours prior to test.  On the Day of the Test: . Drink plenty of water. Do not drink any water within one hour of the test. . Do not eat any food 4 hours prior to the test. . You may take your regular medications prior to the test.  . Take metoprolol (Lopressor) two hours prior to test. . HOLD Furosemide/Hydrochlorothiazide morning of the test.        After the Test: . Drink plenty of water. . After receiving IV contrast, you may experience a mild flushed feeling. This is normal. . On occasion, you may experience a mild rash up to 24 hours after the test. This is not dangerous. If this occurs, you can take Benadryl 25 mg and increase your fluid intake. . If you experience trouble breathing, this can be serious. If it is severe call 911 IMMEDIATELY. If it is mild, please call our office. . If you take any of these medications: Glipizide/Metformin, Avandament, Glucavance, please do not take 48 hours after completing test unless otherwise instructed.   Once we have confirmed authorization from your insurance company, we will call you to set up a date  and time for your test.   For non-scheduling related questions, please contact the cardiac imaging nurse navigator should you have any questions/concerns: Marchia Bond, Cardiac Imaging Nurse Navigator Burley Saver, Interim Cardiac Imaging Nurse [place] and Vascular Services Direct Office Dial: [phone]   For scheduling needs, including cancellations and rescheduling, please call [phone].        Signed, Sinclair Grooms, MD  08/25/2019 12:05 PM    [facility] [place]

## 2019-08-25 ENCOUNTER — Ambulatory Visit (INDEPENDENT_AMBULATORY_CARE_PROVIDER_SITE_OTHER): Payer: Self-pay | Admitting: Interventional Cardiology

## 2019-08-25 ENCOUNTER — Encounter: Payer: Self-pay | Admitting: Interventional Cardiology

## 2019-08-25 ENCOUNTER — Other Ambulatory Visit: Payer: Self-pay

## 2019-08-25 VITALS — BP 120/68 | HR 63 | Ht 62.0 in | Wt 158.4 lb

## 2019-08-25 DIAGNOSIS — E1165 Type 2 diabetes mellitus with hyperglycemia: Secondary | ICD-10-CM

## 2019-08-25 DIAGNOSIS — E7849 Other hyperlipidemia: Secondary | ICD-10-CM

## 2019-08-25 DIAGNOSIS — Z720 Tobacco use: Secondary | ICD-10-CM

## 2019-08-25 DIAGNOSIS — Z7189 Other specified counseling: Secondary | ICD-10-CM

## 2019-08-25 DIAGNOSIS — R072 Precordial pain: Secondary | ICD-10-CM

## 2019-08-25 DIAGNOSIS — I6300 Cerebral infarction due to thrombosis of unspecified precerebral artery: Secondary | ICD-10-CM

## 2019-08-25 MED ORDER — METOPROLOL TARTRATE 100 MG PO TABS
ORAL_TABLET | ORAL | 0 refills | Status: DC
Start: 2019-08-25 — End: 2020-01-25

## 2019-08-25 MED FILL — METOPROLOL TARTRATE 100 MG: 100 | 1 days supply | Qty: 1 | Fill #0

## 2019-08-25 NOTE — Patient Instructions (Addendum)
Medication Instructions:  Your physician recommends that you continue on your current medications as directed. Please refer to the Current Medication list given to you today.  *If you need a refill on your cardiac medications before your next appointment, please call your pharmacy*   Lab Work: None If you have labs (blood work) drawn today and your tests are completely normal, you will receive your results only by: Marland Kitchen MyChart Message (if you have MyChart) OR . A paper copy in the mail If you have any lab test that is abnormal or we need to change your treatment, we will call you to review the results.   Testing/Procedures: Your physician recommends that you have a Coronary CT performed.    Follow-Up: At [facility], you and your health needs are our priority.  As part of our continuing mission to provide you with exceptional heart care, we have created designated Provider Care Teams.  These Care Teams include your primary Cardiologist (physician) and Advanced Practice Providers (APPs -  Physician Assistants and Nurse Practitioners) who all work together to provide you with the care you need, when you need it.  We recommend signing up for the patient portal called "MyChart".  Sign up information is provided on this After Visit Summary.  MyChart is used to connect with patients for Virtual Visits (Telemedicine).  Patients are able to view lab/test results, encounter notes, upcoming appointments, etc.  Non-urgent messages can be sent to your provider as well.   To learn more about what you can do with MyChart, go to [email or web address].    Your next appointment:   As needed  The format for your next appointment:   In Person  Provider:   You may see Dr. Daneen Schick or one of the following Advanced Practice Providers on your designated Care Team:    Truitt Merle, NP  Cecilie Kicks, NP  Kathyrn Drown, NP    Other Instructions  Your cardiac CT will be scheduled at one of the  below locations:   [facility] [address] [place], [place] [address] [phone]  [place] [address] [place], [place] [address] [phone]  If scheduled at [facility], please arrive at the [facility] main entrance of [facility] 30 minutes prior to test start time. Proceed to the [facility] Radiology Department (first floor) to check-in and test prep.  If scheduled at [facility], please arrive 15 mins early for check-in and test prep.  Please follow these instructions carefully (unless otherwise directed):  Hold all erectile dysfunction medications at least 3 days (72 hrs) prior to test.  On the Night Before the Test: . Be sure to Drink plenty of water. . Do not consume any caffeinated/decaffeinated beverages or chocolate 12 hours prior to your test. . Do not take any antihistamines 12 hours prior to your test. . If you take Metformin do not take 24 hours prior to test.  On the Day of the Test: . Drink plenty of water. Do not drink any water within one hour of the test. . Do not eat any food 4 hours prior to the test. . You may take your regular medications prior to the test.  . Take metoprolol (Lopressor) two hours prior to test. . HOLD Furosemide/Hydrochlorothiazide morning of the test.        After the Test: . Drink plenty of water. . After receiving IV contrast, you may experience  a mild flushed feeling. This is normal. . On occasion, you may experience a mild rash up to 24 hours after the test. This is not dangerous. If this occurs, you can take Benadryl 25 mg and increase your fluid intake. . If you experience trouble breathing, this can be serious. If it is severe call 911 IMMEDIATELY. If it is mild, please call our office. . If you take any of these medications: Glipizide/Metformin, Avandament, Glucavance, please do not take 48 hours after  completing test unless otherwise instructed.   Once we have confirmed authorization from your insurance company, we will call you to set up a date and time for your test.   For non-scheduling related questions, please contact the cardiac imaging nurse navigator should you have any questions/concerns: Marchia Bond, Cardiac Imaging Nurse Navigator Burley Saver, Interim Cardiac Imaging Nurse [place] and Vascular Services Direct Office Dial: [phone]   For scheduling needs, including cancellations and rescheduling, please call [phone].

## 2019-08-25 NOTE — Progress Notes (Signed)
NEUROLOGY FOLLOW UP OFFICE NOTE  Arthur Howard [id number]  HISTORY OF PRESENT ILLNESS: Arthur Howard is a 19year oldright-handed man with type 2 diabetes and hyperlipidemia whofollows up for stroke.   UPDATE: Current medications: ASA 32m daily; atorvastatin 850mdaily; metformin; glipizide at bedtime; tramadol  Still seeing Dr. KiLetta Pateor pain.  No longer on gabapentin.  Takes tizanidine as needed.    HISTORY: [name] for stroke after waking up in the morning withsome slurred speech andright upper and lower extremity weakness and numbness. CT of head demonstrated small age indeterminate infarct involving the posterior limb of the right internal capsule (which subsequently was shown to be chronic on MRI). CTA of head ane neck revealed no large vessel occlusion or stenosis. MRI of brain demonstrated small acute left inferior ventral pontine infarct as well as chronic small vessel ischemic changges and old right basal ganglia lacunar infarct. 2D echo showed EF 6-65% with no source of embolus. LDL was 133. Hgb A1c was 11.3. Telemetry revealed no arrhythmia. He was started on ASA 8125mnd Plavix 11m76mr secondary stroke prevention. His Lipitor was increased from 40mg61m80mg 55my. Diabetes management was optimized. He was discharged to inpatient rehab and subsequently discharged home with outpatient rehab. He is now off Plavix but remains on ASA.  PAST MEDICAL HISTORY: Past Medical History:  Diagnosis Date  . Diabetes mellitus without complication ([facility]) [place]  7989perlipidemia   . Stroke ([facility]) [facility]   R sided weakness    MEDICATIONS: Current Outpatient Medications on File Prior to Visit  Medication Sig Dispense Refill  . acetaminophen (TYLENOL) 325 MG tablet Take 2 tablets (650 mg total) by mouth every 4 (four) hours as needed for mild pain (or temp > 37.5 C (99.5 F)).    . aspiMarland Kitchenin EC 81 MG  EC tablet Take 1 tablet (81 mg total) by mouth daily.    . atorMarland Kitchenastatin (LIPITOR) 80 MG tablet Take 1 tablet (80 mg total) by mouth daily. 90 tablet 1  . blood glucose meter kit and supplies KIT Dispense based on patient and insurance preference. Use up to four times daily as directed. (FOR ICD-9 250.00, 250.01). 1 each 0  . gabapentin (NEURONTIN) 300 MG capsule Take 1 capsule in AM, 1 capsule afternoon and 2 capsules at night for 1 week, then 2 capsules in AM, 1 capsule afternoon and 2 capsules at night for 1 week, then 2 capsules three times daily. 180 capsule 0  . glipiZIDE (GLUCOTROL) 10 MG tablet Take 1 tablet (10 mg total) by mouth 2 (two) times daily before a meal. 180 tablet 1  . metFORMIN (GLUCOPHAGE) 1000 MG tablet TAKE 1 TABLET (1,000 MG TOTAL) BY MOUTH 2 TIMES DAILY WITH A MEAL. 180 tablet 1  . traMADol (ULTRAM) 50 MG tablet Take 1 tablet (50 mg total) by mouth at bedtime. (Patient not taking: Reported on 07/19/2019) 30 tablet 1   No current facility-administered medications on file prior to visit.    ALLERGIES: No Known Allergies  FAMILY HISTORY: Family History  Problem Relation Age of Onset  . Heart disease Mother   . Lung cancer Father   . Diabetes Sister   . Diabetes Brother   . CVA Neg Hx   . Colon cancer Neg Hx   . Colon polyps Neg Hx   . Esophageal cancer Neg Hx   . Rectal cancer Neg Hx   . Stomach cancer Neg Hx  SOCIAL HISTORY: Social History   Socioeconomic History  . Marital status: Married    Spouse name: Arthur Howard  . Number of children: 7  . Years of education: Not on file  . Highest education level: 6th grade  Occupational History  . Occupation: Sales executive    Comment: [employer]  Tobacco Use  . Smoking status: Light Tobacco Smoker    Types: Cigarettes    Last attempt to quit: 05/13/2018    Years since quitting: 1.2  . Smokeless tobacco: Never Used  . Tobacco comment: never a daily smoker  Substance and Sexual Activity  . Alcohol use: No     Alcohol/week: 0.0 standard drinks  . Drug use: No  . Sexual activity: Not on file  Other Topics Concern  . Not on file  Social History Narrative   Works with Sales executive. Lives in [place]. Moved from [place] 35 years ago. He is married. Has 7 children (all girls).       Patient is right-handed. He lives with his wife in a one level home. He walks daily for exercise.   Social Determinants of Health   Financial Resource Strain:   . Difficulty of Paying Living Expenses:   Food Insecurity:   . Worried About Charity fundraiser in the Last Year:   . Arboriculturist in the Last Year:   Transportation Needs:   . Film/video editor (Medical):   Marland Kitchen Lack of Transportation (Non-Medical):   Physical Activity:   . Days of Exercise per Week:   . Minutes of Exercise per Session:   Stress:   . Feeling of Stress :   Social Connections:   . Frequency of Communication with Friends and Family:   . Frequency of Social Gatherings with Friends and Family:   . Attends Religious Services:   . Active Member of Clubs or Organizations:   . Attends Archivist Meetings:   Marland Kitchen Marital Status:   Intimate Partner Violence:   . Fear of Current or Ex-Partner:   . Emotionally Abused:   Marland Kitchen Physically Abused:   . Sexually Abused:     PHYSICAL EXAM: Blood pressure 130/79, pulse 70, height _0  (1.626 m), weight 157 lb 12.8 oz (71.6 kg), SpO2 98 %. General: No acute distress.  Patient appears well-groomed.   Head:  Normocephalic/atraumatic Eyes:  Fundi examined but not visualized Neck: supple, no paraspinal tenderness, full range of motion Heart:  Regular rate and rhythm Lungs:  Clear to auscultation bilaterally Back: No paraspinal tenderness Neurological Exam: alert and oriented to person, place, and time. Attention span and concentration intact, recent and remote memory intact, fund of knowledge intact.  Speech fluent and not dysarthric, language intact.  CN II-XII intact. Bulk normal,  increased tone in right upper and lower extremity, muscle strength 5-/5 right upper extremity, otherwise muscle strength 5/5 throughout.  Sensation to pinprick and vibration intact.  Deep tendon reflexes 3+ right upper and lower extremities, 2+ left upper and lower extremities, toes downgoing.  Finger to nose and heel to shin testing intact.  Gait normal, Romberg negative.  IMPRESSION: 1.  Right sided hemiplegia as late effect of small left pontine infarct secondary to small vessel disease 2.  Right upper extremity complex regional pain syndrome 3.  Type 2 diabetes mellitus 4.  Hyperlipidemia  PLAN: Secondary stroke prevention as managed by PCP: 1.  ASA 18m daily for secondary stroke prevention 2.  Lipitor 864mdaily (LDL goal less than 70) 3.  Blood pressure  management (goal less than 130/90) 4.  Glycemic control (Hgb A1c goal less than 7) 5.  Mediterranean diet Follow up as needed  Metta Clines, DO  CC: Lelon Frohlich, MD

## 2019-08-26 ENCOUNTER — Ambulatory Visit (INDEPENDENT_AMBULATORY_CARE_PROVIDER_SITE_OTHER): Payer: Self-pay | Admitting: Neurology

## 2019-08-26 ENCOUNTER — Encounter: Payer: Self-pay | Admitting: Neurology

## 2019-08-26 VITALS — BP 130/79 | HR 70 | Ht 64.0 in | Wt 157.8 lb

## 2019-08-26 DIAGNOSIS — G894 Chronic pain syndrome: Secondary | ICD-10-CM

## 2019-08-26 DIAGNOSIS — I639 Cerebral infarction, unspecified: Secondary | ICD-10-CM

## 2019-08-26 DIAGNOSIS — I6300 Cerebral infarction due to thrombosis of unspecified precerebral artery: Secondary | ICD-10-CM

## 2019-08-26 DIAGNOSIS — E7849 Other hyperlipidemia: Secondary | ICD-10-CM

## 2019-08-26 DIAGNOSIS — I6350 Cerebral infarction due to unspecified occlusion or stenosis of unspecified cerebral artery: Secondary | ICD-10-CM

## 2019-08-26 DIAGNOSIS — E119 Type 2 diabetes mellitus without complications: Secondary | ICD-10-CM

## 2019-08-26 DIAGNOSIS — I69351 Hemiplegia and hemiparesis following cerebral infarction affecting right dominant side: Secondary | ICD-10-CM

## 2019-08-26 NOTE — Patient Instructions (Addendum)
1.  Continue aspirin 81mg  daily 2.  Continue atorvastatin 80mg  daily 3.  Continue medications for blood pressure and diabetes 4.  Follow up as needed.

## 2019-11-16 ENCOUNTER — Telehealth: Payer: Self-pay | Admitting: Interventional Cardiology

## 2019-11-16 NOTE — Telephone Encounter (Signed)
Scheduling has tried multiple times to reach pt/daughter to get pt scheduled for Coronary CT.  Called daughter, Lattie Haw and left message to call back and schedule CT or let us know that pt has decided not to have test done.

## 2019-11-29 NOTE — Telephone Encounter (Signed)
Called and left message on daughter's phone again about calling back to schedule Coronary CT or let us know if the pt has decided not to go through with testing.

## 2020-01-16 ENCOUNTER — Telehealth: Payer: Self-pay | Admitting: Interventional Cardiology

## 2020-01-16 NOTE — Telephone Encounter (Signed)
Pt c/o of Chest Pain: STAT if CP now or developed within 24 hours  1. Are you having CP right now? Yes, sharp pain that feels like something is grabbing his heart  2. Are you experiencing any other symptoms (ex. SOB, nausea, vomiting, sweating)? no  3. How long have you been experiencing CP? A couple of days ago  4. Is your CP continuous or coming and going? continuous  5. Have you taken Nitroglycerin? no ?  Patient has an appt with Melina Copa on Friday at 11:15am

## 2020-01-16 NOTE — Telephone Encounter (Signed)
Spoke with the patients daughter, Lattie Haw who was interpreting for her father. She states he has had sharp/stabbing chest pain for the past two days. It is currently a 3/10 but he has been grabbing his chest. He denies any SOB or diaphoresis at this time. Patient does not have a current BP or HR. Recent Coronary CT was canceled by patient due to insurance issues. Guy Franco to take her father to the ED to be evaluated as soon as possible. She verbalized understanding and is agreeable to plan.

## 2020-01-20 ENCOUNTER — Ambulatory Visit: Payer: Self-pay | Admitting: Physician Assistant

## 2020-01-24 ENCOUNTER — Emergency Department (HOSPITAL_COMMUNITY)
Admission: EM | Admit: 2020-01-24 | Discharge: 2020-01-24 | Disposition: A | Discharge status: Home / Self Care | Payer: Medicaid Other | Attending: Emergency Medicine | Admitting: Emergency Medicine

## 2020-01-24 ENCOUNTER — Encounter (HOSPITAL_COMMUNITY): Payer: Self-pay

## 2020-01-24 ENCOUNTER — Other Ambulatory Visit: Payer: Self-pay

## 2020-01-24 ENCOUNTER — Emergency Department (HOSPITAL_COMMUNITY): Payer: Medicaid Other

## 2020-01-24 DIAGNOSIS — Z7984 Long term (current) use of oral hypoglycemic drugs: Secondary | ICD-10-CM | POA: Diagnosis not present

## 2020-01-24 DIAGNOSIS — Z8673 Personal history of transient ischemic attack (TIA), and cerebral infarction without residual deficits: Secondary | ICD-10-CM | POA: Insufficient documentation

## 2020-01-24 DIAGNOSIS — Z87891 Personal history of nicotine dependence: Secondary | ICD-10-CM | POA: Insufficient documentation

## 2020-01-24 DIAGNOSIS — Z7982 Long term (current) use of aspirin: Secondary | ICD-10-CM | POA: Diagnosis not present

## 2020-01-24 DIAGNOSIS — E119 Type 2 diabetes mellitus without complications: Secondary | ICD-10-CM | POA: Insufficient documentation

## 2020-01-24 DIAGNOSIS — R079 Chest pain, unspecified: Secondary | ICD-10-CM | POA: Diagnosis present

## 2020-01-24 LAB — CBC
HCT: 46 % (ref 39.0–52.0)
Hemoglobin: 15.6 g/dL (ref 13.0–17.0)
MCH: 31.1 pg (ref 26.0–34.0)
MCHC: 33.9 g/dL (ref 30.0–36.0)
MCV: 91.6 fL (ref 80.0–100.0)
Platelets: 316 10*3/uL (ref 150–400)
RBC: 5.02 MIL/uL (ref 4.22–5.81)
RDW: 12.6 % (ref 11.5–15.5)
WBC: 7.9 10*3/uL (ref 4.0–10.5)
nRBC: 0 % (ref 0.0–0.2)

## 2020-01-24 LAB — BASIC METABOLIC PANEL
Anion gap: 10 (ref 5–15)
BUN: 17 mg/dL (ref 6–20)
CO2: 24 mmol/L (ref 22–32)
Calcium: 8.6 mg/dL — ABNORMAL LOW (ref 8.9–10.3)
Chloride: 100 mmol/L (ref 98–111)
Creatinine, Ser: 0.84 mg/dL (ref 0.61–1.24)
GFR, Estimated: >60 mL/min (ref >60)
Glucose, Bld: 341 mg/dL — ABNORMAL HIGH (ref 70–99)
Potassium: 4.7 mmol/L (ref 3.5–5.1)
Sodium: 134 mmol/L — ABNORMAL LOW (ref 135–145)

## 2020-01-24 LAB — TROPONIN I (HIGH SENSITIVITY)
Troponin I (High Sensitivity): 4 ng/L (ref <18)
Troponin I (High Sensitivity): <2 ng/L (ref <18)

## 2020-01-24 NOTE — Discharge Instructions (Addendum)
Please follow-up with your primary care provider regarding today's encounter.  Additionally, please call your cardiologist to see if you can move up your appointment given your symptoms.  You are currently scheduled for 02/14/2020.  Perhaps they can see you sooner.  In the interim, please take ibuprofen 600 mg every 6 hours as needed for your chest discomfort.  Return to the ED or seek immediate medical attention should you experience any new or worsening symptoms.  This includes any palpitations, syncope, shortness of breath, nausea or vomiting, worsening chest pain, fevers or chills, or other symptoms.  Haga un seguimiento con su proveedor de atencin primaria sobre el encuentro de hoy. Adems, llame a su cardilogo para ver si puede adelantar su cita debido a sus sntomas. Actualmente est programado para el 23/01/2020. Quizs puedan verte antes.  Mientras tanto, tome ibuprofeno 600 mg cada 6 horas segn sea necesario para su malestar en el pecho.  Regrese al servicio de urgencias o busque atencin mdica inmediata si experimenta algn sntoma nuevo o que empeora.  Esto incluye palpitaciones, sncope, dificultad para respirar, nuseas o vmitos, empeoramiento del dolor en el pecho, fiebre o escalofros u otros sntomas.

## 2020-01-24 NOTE — ED Provider Notes (Signed)
[facility] EMERGENCY DEPARTMENT Provider Note   CSN: [id number] Arrival date & time: 01/24/20  7106     History Chief Complaint  Patient presents with  . Chest Pain    Arthur Howard is a 54 y.o. male with PMH significant for HTN, HLD, type II DM, and CVA on 81 mg aspirin daily who presents to the ED with a 2-week history of progressively worsening left-sided chest pain.  On my examination, patient states that his chest pain has been constant for the past few days, intermittent for 2 weeks.  He denies any obvious precipitating illness, infection, or injury.  He states that the chest pain is very well localized inferiolateral to his left breast.  It does not radiate.  He describes it as sharp, but also "heaviness" when he lays down.  His symptoms are worse when he is lying flat.  Patient denies any other aggravating or alleviating factors.  He denies any exertional component.  Currently is chest pain is 3 out of 10.  He states that he had a stroke last year due to his elevated cholesterol.  He is followed closely by his primary care provider and is taking all of his medications, as directed.  He has an appointment scheduled with Dr. Irish Lack, [facility], in a couple weeks.  However, given his worsening chest pain symptoms in the context of multiple risk factors, he wanted to come to the ED for evaluation.  He is accompanied by his daughter who speaks [place] well and insisted on providing the interpretive services.  Patient denies any recent unilateral extremity swelling or edema, pleuritic symptoms, recent surgeries or immobilizations, shortness of breath, cough, abdominal pain, nausea or vomiting, diaphoresis, fevers or chills, numbness or weakness, back pain, or other symptoms.  He has not taken any for symptoms.  HPI   Past Medical History:  Diagnosis Date  . Diabetes mellitus without complication ([place]) 2694  . Hyperlipidemia   . Stroke [facility]) 05/06/2018   R sided  weakness    Patient Active Problem List   Diagnosis Date Noted  . 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 Left pontine cerebrovascular accident ([place]) 05/11/2018  . Tobacco abuse   . Uncontrolled type 2 diabetes mellitus with hyperglycemia ([place])   . CVA (cerebral vascular accident) ([place]) 05/06/2018  . Hyperlipidemia 05/30/2017    Past Surgical History:  Procedure Laterality Date  . ANKLE SURGERY         Family History  Problem Relation Age of Onset  . Heart disease Mother   . Lung cancer Father   . Diabetes Sister   . Diabetes Brother   . CVA Neg Hx   . Colon cancer Neg Hx   . Colon polyps Neg Hx   . Esophageal cancer Neg Hx   . Rectal cancer Neg Hx   . Stomach cancer Neg Hx     Social History   Tobacco Use  . Smoking status: Former Smoker    Types: Cigarettes  . Smokeless tobacco: Never Used  . Tobacco comment: never a daily smoker  Vaping Use  . Vaping Use: Never used  Substance Use Topics  . Alcohol use: No    Alcohol/week: 0.0 standard drinks  . Drug use: No    Home Medications Prior to Admission medications   Medication Sig Start Date End Date Taking? Authorizing Provider  acetaminophen (TYLENOL) 325 MG tablet Take 2 tablets (650 mg total) by mouth  every 4 (four) hours as needed for mild pain (or temp > 37.5 C (99.5 F)). 05/21/18   Angiulli, Lavon Paganini, PA-C  aspirin EC 81 MG EC tablet Take 1 tablet (81 mg total) by mouth daily. 05/21/18   Angiulli, Lavon Paganini, PA-C  atorvastatin (LIPITOR) 80 MG tablet Take 1 tablet (80 mg total) by mouth daily. 06/08/19   Erline Hau, MD  blood glucose meter kit and supplies KIT Dispense based on patient and insurance preference. Use up to four times daily as directed. (FOR ICD-9 250.00, 250.01). 05/21/18   Angiulli, Lavon Paganini, PA-C  glipiZIDE (GLUCOTROL) 10 MG tablet Take 1 tablet (10 mg total) by mouth 2 (two) times daily before a meal. 12/21/18   Isaac Bliss,  Rayford Halsted, MD  metFORMIN (GLUCOPHAGE) 1000 MG tablet TAKE 1 TABLET (1,000 MG TOTAL) BY MOUTH 2 TIMES DAILY WITH A MEAL. 07/19/19   Isaac Bliss, Rayford Halsted, MD  metoprolol tartrate (LOPRESSOR) 100 MG tablet Take one tablet by mouth 2 hours prior to your CT 08/25/19   Belva Crome, MD  traMADol (ULTRAM) 50 MG tablet Take 1 tablet (50 mg total) by mouth at bedtime. 06/07/19   Kirsteins, Luanna Salk, MD    Allergies    Patient has no known allergies.  Review of Systems   Review of Systems  All other systems reviewed and are negative.   Physical Exam Updated Vital Signs BP (!) 147/72   Pulse (!) 57   Temp 98.3 F (36.8 C) (Oral)   Resp 17   SpO2 99%   Physical Exam Vitals and nursing note reviewed. Exam conducted with a chaperone present.  Constitutional:      Appearance: Normal appearance.  HENT:     Head: Normocephalic and atraumatic.  Eyes:     General: No scleral icterus.    Conjunctiva/sclera: Conjunctivae normal.  Cardiovascular:     Rate and Rhythm: Normal rate and regular rhythm.     Pulses: Normal pulses.     Heart sounds: Normal heart sounds.  Pulmonary:     Effort: Pulmonary effort is normal. No respiratory distress.     Breath sounds: Normal breath sounds. No wheezing or rales.  Abdominal:     General: Abdomen is flat. There is no distension.     Palpations: Abdomen is soft.     Tenderness: There is no abdominal tenderness. There is no guarding.  Musculoskeletal:     Cervical back: Normal range of motion. No rigidity.     Right lower leg: No edema.     Left lower leg: No edema.  Skin:    General: Skin is dry.     Capillary Refill: Capillary refill takes less than 2 seconds.  Neurological:     General: No focal deficit present.     Mental Status: He is alert and oriented to person, place, and time.     GCS: GCS eye subscore is 4. GCS verbal subscore is 5. GCS motor subscore is 6.  Psychiatric:        Mood and Affect: Mood normal.        Behavior: Behavior  normal.        Thought Content: Thought content normal.     ED Results / Procedures / Treatments   Labs (all labs ordered are listed, but only abnormal results are displayed) Labs Reviewed  BASIC METABOLIC PANEL - Abnormal; Notable for the following components:      Result Value   Sodium 134 (*)  Glucose, Bld 341 (*)    Calcium 8.6 (*)    All other components within normal limits  CBC  TROPONIN I (HIGH SENSITIVITY)  TROPONIN I (HIGH SENSITIVITY)    EKG EKG Interpretation  Date/Time:  Tuesday January 24 2020 07:59:04 EDT Ventricular Rate:  58 PR Interval:  138 QRS Duration: 84 QT Interval:  384 QTC Calculation: 376 R Axis:   65 Text Interpretation: Sinus bradycardia Otherwise normal ECG No significant change since last tracing Confirmed by Gareth Morgan [phone]) on 01/24/2020 8:15:45 AM   Radiology DG Chest 2 View  Result Date: 01/24/2020 CLINICAL DATA:  Chest pain and shortness of breath. EXAM: CHEST - 2 VIEW COMPARISON:  None. FINDINGS: The heart size and mediastinal contours are within normal limits. Both lungs are clear. The visualized skeletal structures are unremarkable. IMPRESSION: No active cardiopulmonary disease. Electronically Signed   By: Kerby Moors M.D.   On: 01/24/2020 08:32    Procedures Procedures (including critical care time)  Medications Ordered in ED Medications - No data to display  ED Course  I have reviewed the triage vital signs and the nursing notes.  Pertinent labs & imaging results that were available during my care of the patient were reviewed by me and considered in my medical decision making (see chart for details).    MDM Rules/Calculators/A&P HEAR Score: 3                        Labs Troponin: 4 >> 2. BMP: Hyperglycemia to 341. CBC: No anemia or leukocytosis concerning for infection.  EKG is personally reviewed and demonstrates a mild bradycardia 58, but otherwise NSR.  Plain films obtained of chest x-ray reviewed and  demonstrate no acute cardiopulmonary disease.  Patient is presenting for chest pain.  Comprehensive work-up obtained to assess for cause of symptoms.  EKG without evidence of STEMI.  Troponin is negative, lowering concern for NSTEMI.  While he certainly has risk factors, patient has a low Heart Score (3) which lowers suspicion for ACS.  I have low suspicion for dissection given normal pulses in extremities and normal mediastinum on CXR.  I reviewed DG Chest and there is no evidence of pneumothorax or consolidation concerning for pneumonia.  There is also no pleural effusion on x-ray or history suggestive of possible esophageal rupture.  Patient is low risk for PE per Wells Criteria.  I discussed the patient the exact etiology of their chest pain is unclear and warrants follow up with a primary care provider. Also discussed that while their risk for ACS is low, it is not completely eliminated and I discussed with patient specific warning signs and return precautions.  At time of discharge, patient is feeling fine and plans to take NSAIDs for his chest pain.  His history of near constant chest discomfort that is 3-5 out of 10 and worse with lying flat is somewhat concerning for pericarditis, despite lack of any diffuse ST elevation on EKG.  His troponins were reassuring.  He denies any syncope or other associated symptoms with his chest pain.  No fevers or chills.  Discussed case with Dr. Billy Fischer and we will have patient call the office of Dr. Irish Lack to see if he can move it up given his symptoms.  He is currently scheduled for 02/14/2020.  ED return precautions discussed.  Patient and daughter voiced understanding and are agreeable to the plan.  Daughter was utilized as Astronomer for entirety of history, assessment, and plan.  Final Clinical Impression(s) / ED Diagnoses Final diagnoses:  Nonspecific chest pain    Rx / DC Orders ED Discharge Orders    None       Corena Herter,  PA-C 01/24/20 1256    Gareth Morgan, MD 01/24/20 2255

## 2020-01-24 NOTE — ED Triage Notes (Signed)
Pt presents with Left side CP x2 weeks, increase pain this am.

## 2020-01-24 NOTE — Progress Notes (Signed)
CARDIOLOGY OFFICE NOTE  Date:  01/25/2020    [name] Date of Birth: 1966-03-17 Medical Record #[id number]  PCP:  Isaac Bliss, Rayford Halsted, MD  Cardiologist:  Tamala Julian  Chief Complaint  Patient presents with  . Follow-up    Post ER visit - seen for Dr. Tamala Julian    History of Present Illness: Arthur Howard is a 54 y.o. male who presents today for a post ER visit. Seen for Dr. Tamala Julian.   He has a history of CVA, HLD and poorly contolled DM II,   Seen back this past summer by Dr. Tamala Julian following CVA for atypical chest pain. Coronary CT was ordered but he did not follow thru.   He is disabled after stroke due to right hemiparesis.  In the ER yesterday with chest pain - thus added to my schedule for today. EKG ok.   Comes in today. Here with his daughter who is interpreting for him. She gives the history. He had had a stabbing pain over his heart - had had for 2 weeks - comes and goes - no trigger noted - more constant the past 2 to 3 days. She wonders if he was worrying - one child is needing surgery - may have had some extra angst and thus that made his chest hurt. No pain today. Not short of breath. He is active - despite the prior stroke - walks most day - no chest pain with walking. Seems to have had more at rest. Blood sugar was high yesterday.   Past Medical History:  Diagnosis Date  . Diabetes mellitus without complication ([place]) 1962  . Hyperlipidemia   . Stroke [facility]) 05/06/2018   R sided weakness    Past Surgical History:  Procedure Laterality Date  . ANKLE SURGERY       Medications: Current Meds  Medication Sig  . aspirin EC 81 MG EC tablet Take 1 tablet (81 mg total) by mouth daily.  Marland Kitchen atorvastatin (LIPITOR) 80 MG tablet Take 1 tablet (80 mg total) by mouth daily.  Marland Kitchen glipiZIDE (GLUCOTROL) 10 MG tablet Take 1 tablet (10 mg total) by mouth 2 (two) times daily before a meal.  . metFORMIN (GLUCOPHAGE) 1000 MG tablet TAKE 1 TABLET (1,000 MG TOTAL) BY  MOUTH 2 TIMES DAILY WITH A MEAL.     Allergies: No Known Allergies  Social History: The patient  reports that he has quit smoking. His smoking use included cigarettes. He has never used smokeless tobacco. He reports that he does not drink alcohol and does not use drugs.   Family History: The patient's family history includes Diabetes in his brother and sister; Heart disease in his mother; Lung cancer in his father.   Review of Systems: Please see the history of present illness.   All other systems are reviewed and negative.   Physical Exam: VS:  BP 104/62   Pulse 63   Ht 5\' 4"  (1.626 m)   Wt 152 lb (68.9 kg)   SpO2 98%   BMI 26.09 kg/m  .  BMI Body mass index is 26.09 kg/m.  Wt Readings from Last 3 Encounters:  01/25/20 152 lb (68.9 kg)  08/26/19 157 lb 12.8 oz (71.6 kg)  08/25/19 158 lb 6.4 oz (71.8 kg)    General: Alert and in no acute distress.   Cardiac: Regular rate and rhythm. No murmurs, rubs, or gallops. No edema.  Respiratory:  Lungs are clear to auscultation bilaterally with normal work of breathing.  GI: Soft and nontender.  MS: No deformity or atrophy. Gait and ROM intact.  Skin: Warm and dry. Color is normal.  Neuro:  Strength and sensation are intact and no gross focal deficits noted.  Psych: Alert, appropriate and with normal affect.   LABORATORY DATA:  EKG:  EKG is not ordered today.  EKG from yesterday in the ER showed sinus brady - HR 58 - no acute changes.   Lab Results  Component Value Date   WBC 7.9 01/24/2020   HGB 15.6 01/24/2020   HCT 46.0 01/24/2020   PLT 316 01/24/2020   GLUCOSE 341 (H) 01/24/2020   CHOL 183 03/22/2019   TRIG 181.0 (H) 03/22/2019   HDL 41.60 03/22/2019   LDLCALC 105 (H) 03/22/2019   ALT 44 05/12/2018   AST 38 05/12/2018   NA 134 (L) 01/24/2020   K 4.7 01/24/2020   CL 100 01/24/2020   CREATININE 0.84 01/24/2020   BUN 17 01/24/2020   CO2 24 01/24/2020   TSH 1.891 05/06/2018   PSA 0.4 11/10/2015   INR 0.99  05/06/2018   HGBA1C 9.4 (A) 06/08/2019   MICROALBUR 0.6 11/10/2015     BNP (last 3 results) No results for input(s): BNP in the last 8760 hours.  ProBNP (last 3 results) No results for input(s): PROBNP in the last 8760 hours.   Other Studies Reviewed Today:  ECHO IMPRESSIONS 04/2018 1. The left ventricle has normal systolic function with an ejection  fraction of 60-65%. The cavity size was normal. Left ventricular diastolic  parameters were normal.  2. The right ventricle has normal systolic function. The cavity was  normal. There is no increase in right ventricular wall thickness.  3. The mitral valve is normal in structure.  4. The tricuspid valve is normal in structure.  5. The aortic valve is tricuspid Mild sclerosis of the aortic valve.  6. The pulmonic valve was normal in structure.  7. Right atrial pressure is estimated at 3 mmHg.   ASSESSMENT & PLAN:    1. Chest pain - atypical but with lots of risk factors - Dr. Tamala Julian had recommended coronary CT this past summer - he is agreeable to proceeding on with this study. Further disposition to follow.   2. HLD - on statin therapy - would favor continuing.   3. Uncontrolled DM - per PCP  4. Tobacco abuse - not smoking.   5. Prior CVA - despite prior stroke - staying pretty active.   Current medicines are reviewed with the patient today.  The patient does not have concerns regarding medicines other than what has been noted above.  The following changes have been made:  See above.  Labs/ tests ordered today include:   No orders of the defined types were placed in this encounter.    Disposition:   Further disposition to follow.    Patient is agreeable to this plan and will call if any problems develop in the interim.   SignedTruitt Merle, NP  01/25/2020 10:23 AM  [place] [address] [place], [place]  [address] Phone: [phone] Fax: [phone]

## 2020-01-24 NOTE — ED Notes (Signed)
Patient verbalizes understanding of discharge instructions. Opportunity for questioning and answers were provided. Pt discharged from ED ambulatory.  

## 2020-01-25 ENCOUNTER — Encounter: Payer: Self-pay | Admitting: Nurse Practitioner

## 2020-01-25 ENCOUNTER — Other Ambulatory Visit: Payer: Self-pay | Admitting: Internal Medicine

## 2020-01-25 ENCOUNTER — Ambulatory Visit (INDEPENDENT_AMBULATORY_CARE_PROVIDER_SITE_OTHER): Payer: Medicaid Other | Admitting: Nurse Practitioner

## 2020-01-25 VITALS — BP 104/62 | HR 63 | Ht 64.0 in | Wt 152.0 lb

## 2020-01-25 DIAGNOSIS — E1165 Type 2 diabetes mellitus with hyperglycemia: Secondary | ICD-10-CM | POA: Diagnosis not present

## 2020-01-25 DIAGNOSIS — R072 Precordial pain: Secondary | ICD-10-CM | POA: Diagnosis not present

## 2020-01-25 DIAGNOSIS — Z720 Tobacco use: Secondary | ICD-10-CM

## 2020-01-25 DIAGNOSIS — E7849 Other hyperlipidemia: Secondary | ICD-10-CM

## 2020-01-25 MED FILL — ATORVASTATIN 80 MG TABLET: 80 | 90 days supply | Qty: 90 | Fill #0

## 2020-01-25 NOTE — Patient Instructions (Addendum)
After Visit Summary:  We will be checking the following labs today - NONE   Medication Instructions:    Continue with your current medicines.    If you need a refill on your cardiac medications before your next appointment, please call your pharmacy.     Testing/Procedures To Be Arranged:  Lets get the coronary CT scheduled that Dr. Tamala Julian had ordered this summer.   Follow-Up:   Let's see how your test turns out and then we will decide about follow up.     At [facility], you and your health needs are our priority.  As part of our continuing mission to provide you with exceptional heart care, we have created designated Provider Care Teams.  These Care Teams include your primary Cardiologist (physician) and Advanced Practice Providers (APPs -  Physician Assistants and Nurse Practitioners) who all work together to provide you with the care you need, when you need it.  Special Instructions:  . Stay safe, wash your hands for at least 20 seconds and wear a mask when needed.  . It was good to talk with you today.   Your cardiac CT will be scheduled at:   [facility] [address] [place], [place] [address] [phone]   If scheduled at [facility], please arrive at the [facility] main entrance of [facility] 30 minutes prior to test start time. Proceed to the [facility] Radiology Department (first floor) to check-in and test prep.  Please follow these instructions carefully (unless otherwise directed):  Hold all erectile dysfunction medications at least 3 days (72 hrs) prior to test.  On the Night Before the Test: . Be sure to Drink plenty of water. . Do not consume any caffeinated/decaffeinated beverages or chocolate 12 hours prior to your test. . Do not take any antihistamines 12 hours prior to your test.  On the Day of the Test: . Drink plenty of water. Do not drink any water within one hour of the test. . Do not eat any food 4  hours prior to the test. . You may take your regular medications prior to the test.  . Take your regular AM dose metoprolol (Lopressor) two hours prior to test.       After the Test: . Drink plenty of water. . After receiving IV contrast, you may experience a mild flushed feeling. This is normal. . On occasion, you may experience a mild rash up to 24 hours after the test. This is not dangerous. If this occurs, you can take Benadryl 25 mg and increase your fluid intake. . If you experience trouble breathing, this can be serious. If it is severe call 911 IMMEDIATELY. If it is mild, please call our office. . If you take any of these medications: Glipizide/Metformin, Avandament, Glucavance, please do not take 48 hours after completing test unless otherwise instructed.   Once we have confirmed authorization from your insurance company, we will call you to set up a date and time for your test. Based on how quickly your insurance processes prior authorizations requests, please allow up to 4 weeks to be contacted for scheduling your Cardiac CT appointment. Be advised that routine Cardiac CT appointments could be scheduled as many as 8 weeks after your provider has ordered it.  For non-scheduling related questions, please contact the cardiac imaging nurse navigator should you have any questions/concerns: Marchia Bond, Cardiac Imaging Nurse Navigator Burley Saver, Interim Cardiac Imaging Nurse Navigator [facility] Direct Office Dial: [phone]   For scheduling needs, including cancellations and rescheduling, please call Vivien Rota at [phone], option 3.      Call the [place] office at [phone] if you have any questions, problems or concerns.

## 2020-01-26 MED FILL — METFORMIN HCL 1000 MG TABS: 1000 | 90 days supply | Qty: 180 | Fill #0

## 2020-02-01 ENCOUNTER — Telehealth: Payer: Self-pay | Admitting: Interventional Cardiology

## 2020-02-01 NOTE — Telephone Encounter (Signed)
Patient's daughter calling to schedule CT that was put in back in June. Please advise.

## 2020-02-02 NOTE — Telephone Encounter (Signed)
Will route to CT scheduler.

## 2020-02-03 ENCOUNTER — Telehealth: Payer: Self-pay | Admitting: Interventional Cardiology

## 2020-02-03 NOTE — Telephone Encounter (Signed)
Patient's daughter called in to check and see if we have received pre authorization from insurance in regards to the CT scan that needs to be scheduled. Please call/advise.   Thank you!

## 2020-02-03 NOTE — Telephone Encounter (Signed)
Will send call to our Pre Cert dept to follow up with the pt.

## 2020-02-08 ENCOUNTER — Ambulatory Visit: Payer: Medicaid Other | Admitting: Cardiology

## 2020-02-14 ENCOUNTER — Ambulatory Visit: Payer: Medicaid Other | Admitting: Nurse Practitioner

## 2020-03-31 IMAGING — CT CT HEAD W/O CM
3 series · 15 of 47 positions shown, 18 images · non-contrast
Comparison: None.

CLINICAL DATA: Right-sided weakness and dysarthria

EXAM:
CT HEAD WITHOUT CONTRAST
TECHNIQUE: Contiguous axial images were obtained from the base of the skull
through the vertex without intravenous contrast.

[Series 3: head 5.0 h30s · axial · 0.44mm/px · z∈[-70,+55]mm · 9 of 31 slices shown, 12 images]
[im 3/31  brain]
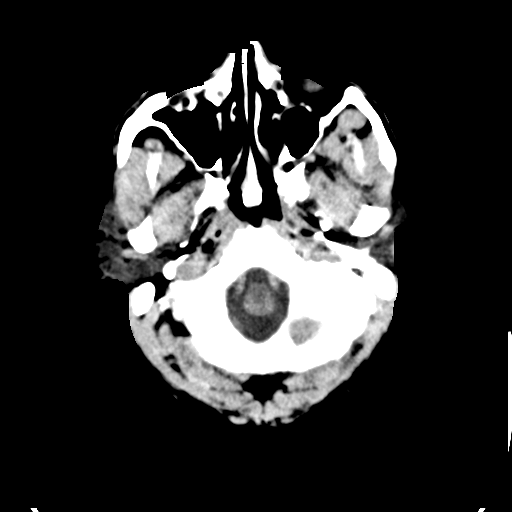
[im 3/31  bone]
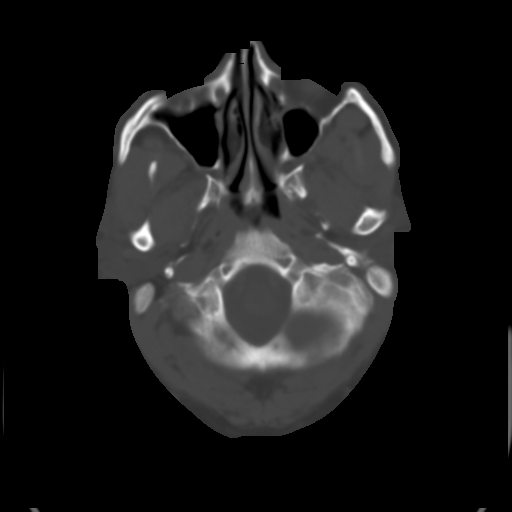
[im 6/31  brain]
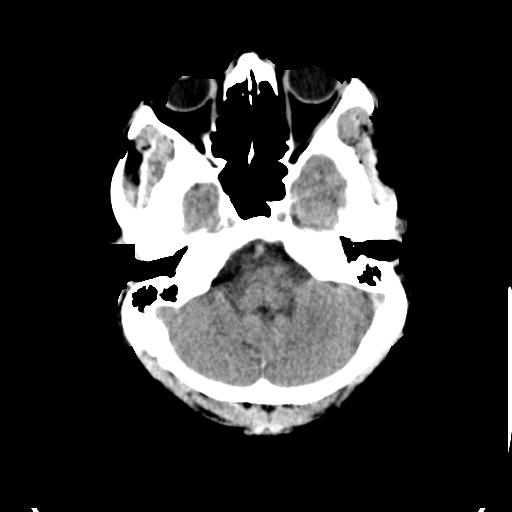
[im 9/31  brain]
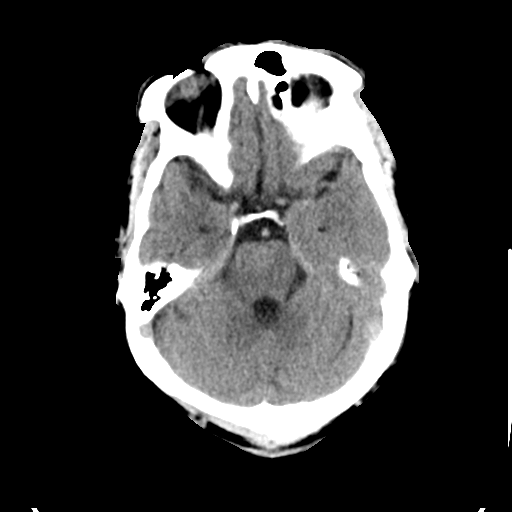
[im 12/31  brain]
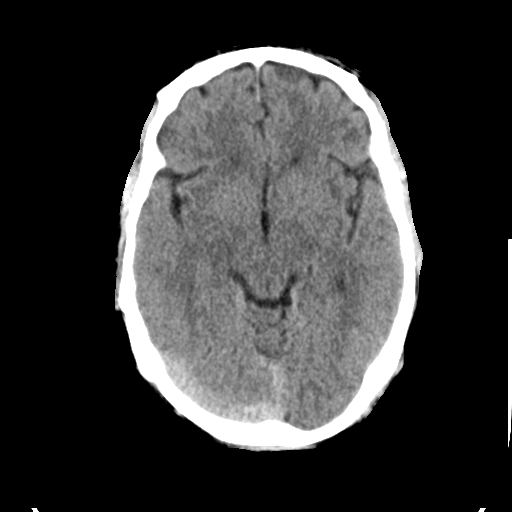
[im 16/31  brain]
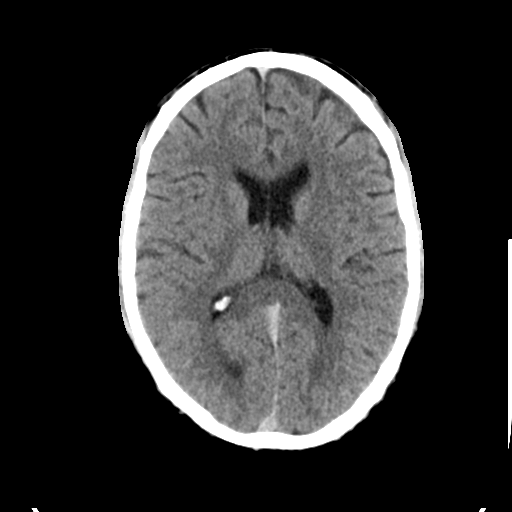
[im 16/31  bone]
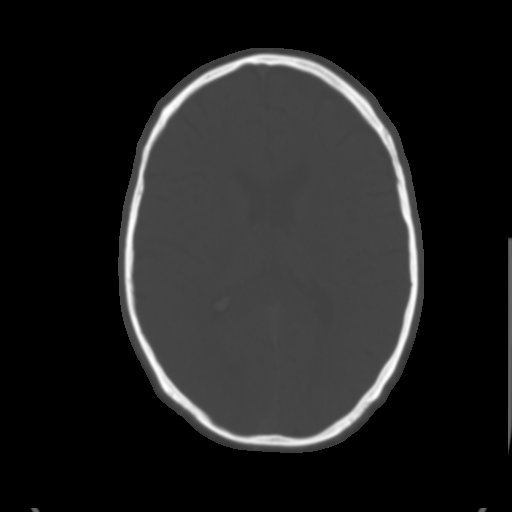
[im 19/31  brain]
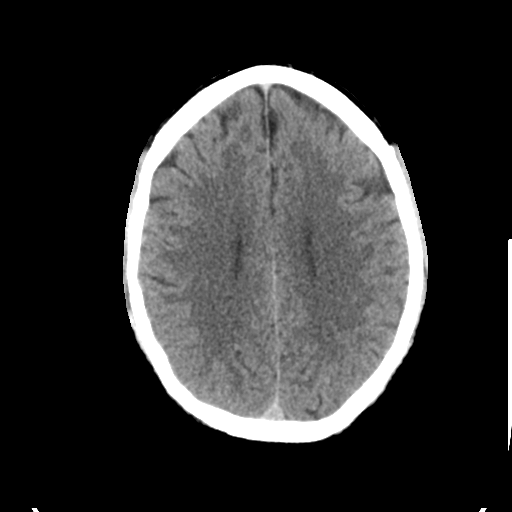
[im 22/31  brain]
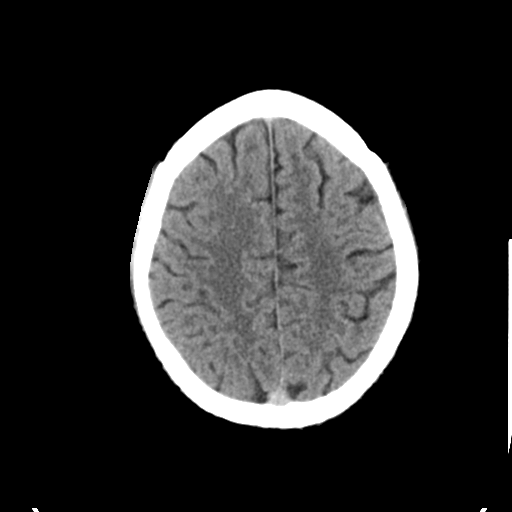
[im 25/31  brain]
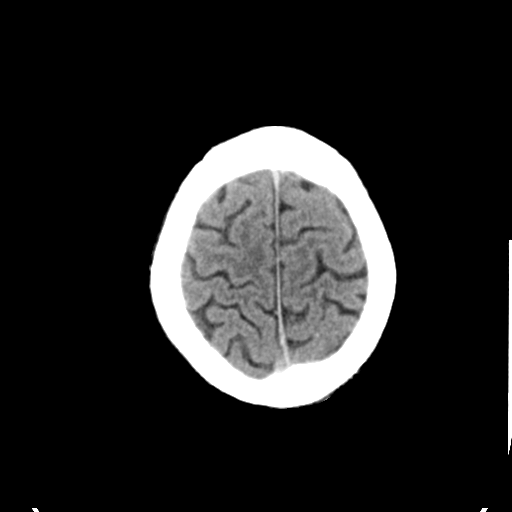
[im 28/31  brain]
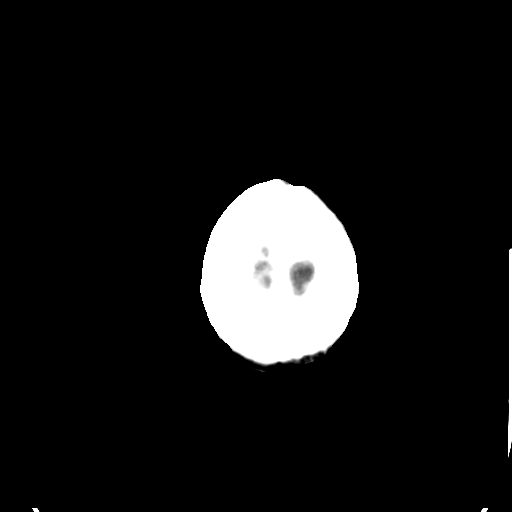
[im 28/31  bone]
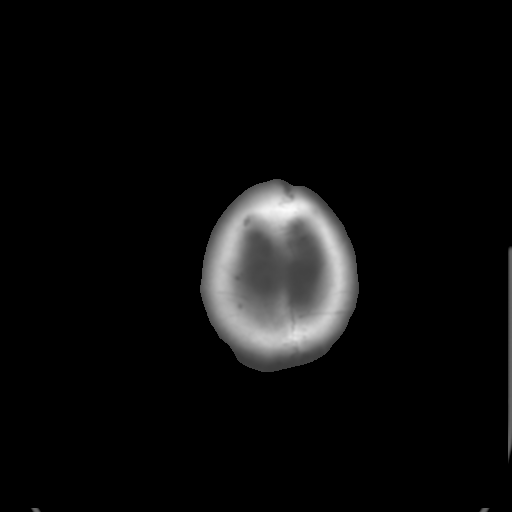

[Series 5: head 3.0 mpr cor · coronal · 0.33mm/px · 3 of 71 slices shown]
[im 24/71  brain]
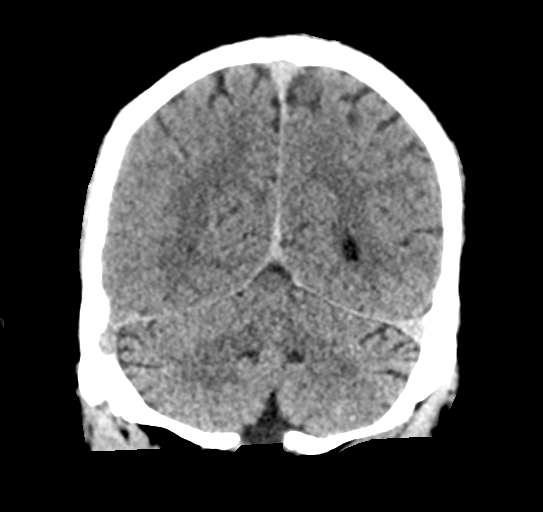
[im 32/71  brain]
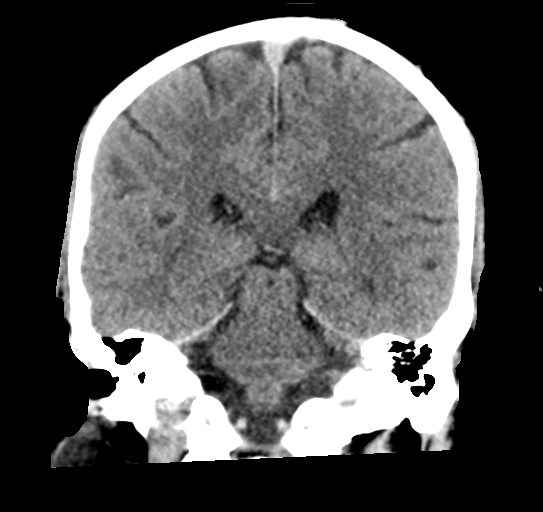
[im 39/71  brain]
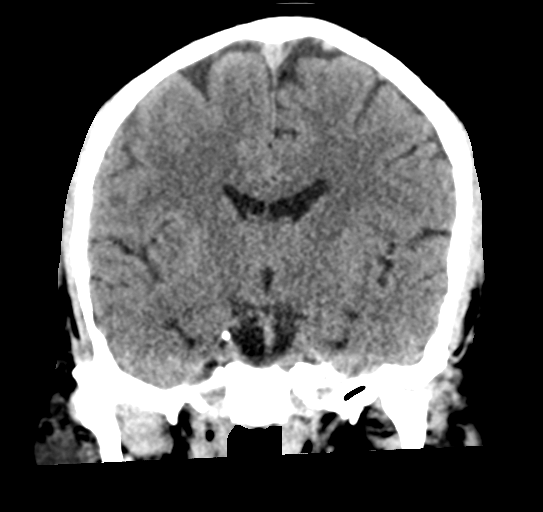

[Series 6: head 3.0 mpr sag · sagittal · 0.30mm/px · 3 of 57 slices shown]
[im 19/57  brain]
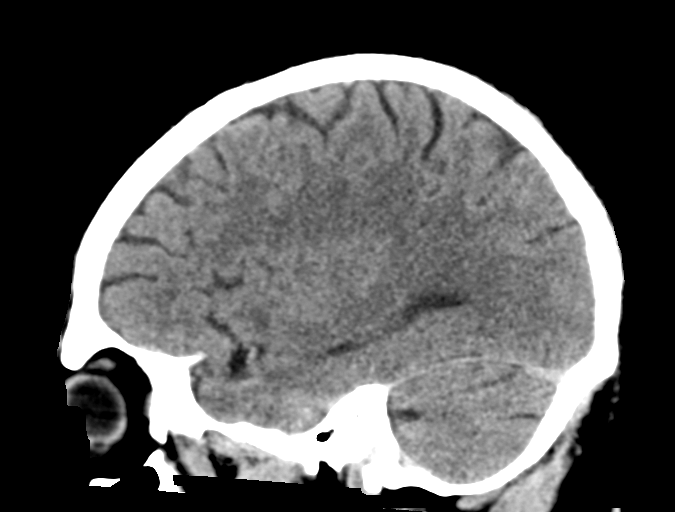
[im 29/57  brain]
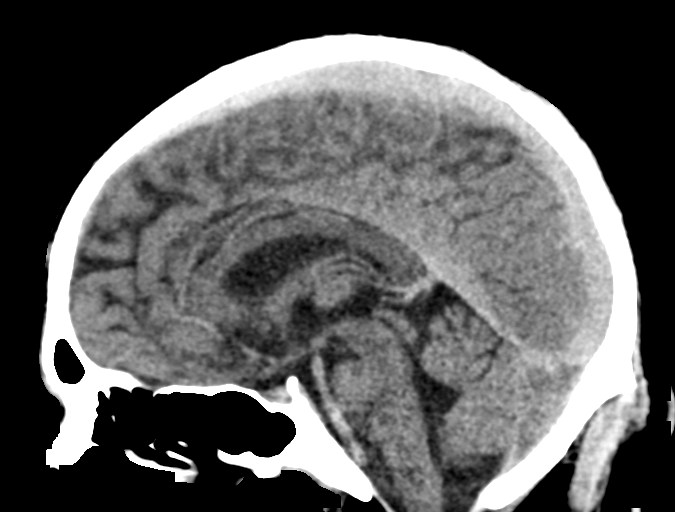
[im 38/57  brain]
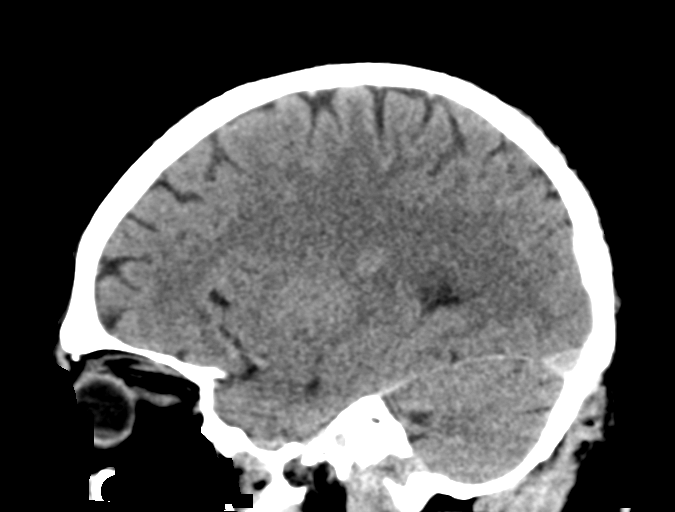

[15 of 47 positions shown; findings below may reference images not displayed]

FINDINGS: Brain: Ventricles are normal in size and configuration. There is no
intracranial mass, hemorrhage, extra-axial fluid collection, or
midline shift. There is focal decreased attenuation along a portion
of the genu and posterior limb of the right internal capsule
consistent with age uncertain and possible recent small infarct in
this area. Elsewhere brain parenchyma appears unremarkable.

Vascular: No hyperdense vessel. There is calcification in each
carotid siphon region.

Skull: Bony calvarium appears intact.

Sinuses/Orbits: There is mucosal thickening involving multiple
ethmoid air cells bilaterally. There is a small retention cyst in
the superomedial right maxillary antrum. Orbits appear symmetric
bilaterally.

Other: Mastoid air cells are clear.
IMPRESSION: Small age uncertain infarct involving a portion of the genu and
posterior limb of the right internal capsule. Brain parenchyma
elsewhere appears unremarkable. No evident mass or hemorrhage.

There are foci of arterial vascular calcification. There are foci of
paranasal sinus disease.

## 2020-04-25 ENCOUNTER — Telehealth (HOSPITAL_COMMUNITY): Payer: Self-pay | Admitting: Emergency Medicine

## 2020-04-25 NOTE — Telephone Encounter (Signed)
Attempted to call patient regarding upcoming cardiac CT appointment. °Left message on voicemail with name and callback number °Zakeya Junker RN Navigator Cardiac Imaging ° Heart and Vascular Services °[phone] Office °[phone] Cell ° °

## 2020-04-26 ENCOUNTER — Other Ambulatory Visit: Payer: Medicaid Other

## 2020-04-26 ENCOUNTER — Telehealth (HOSPITAL_COMMUNITY): Payer: Self-pay | Admitting: *Deleted

## 2020-04-26 ENCOUNTER — Other Ambulatory Visit: Payer: Self-pay

## 2020-04-26 DIAGNOSIS — R072 Precordial pain: Secondary | ICD-10-CM

## 2020-04-26 LAB — BASIC METABOLIC PANEL
BUN/Creatinine Ratio: 19 (ref 9–20)
BUN: 14 mg/dL (ref 6–24)
CO2: 22 mmol/L (ref 20–29)
Calcium: 9.1 mg/dL (ref 8.7–10.2)
Chloride: 101 mmol/L (ref 96–106)
Creatinine, Ser: 0.74 mg/dL — ABNORMAL LOW (ref 0.76–1.27)
GFR calc Af Amer: 119 mL/min/{1.73_m2} (ref >59)
GFR calc non Af Amer: 103 mL/min/{1.73_m2} (ref >59)
Glucose: 375 mg/dL — ABNORMAL HIGH (ref 65–99)
Potassium: 4.7 mmol/L (ref 3.5–5.2)
Sodium: 136 mmol/L (ref 134–144)

## 2020-04-26 NOTE — Telephone Encounter (Signed)
Pt's daughter returning call regarding upcoming cardiac imaging study; pt verbalizes understanding of appt date/time, parking situation and where to check in, pre-test NPO status; name and call back number provided for further questions should they arise  Gordy Clement RN Navigator Cardiac [place] [phone] office [phone] cell

## 2020-04-27 ENCOUNTER — Ambulatory Visit (HOSPITAL_COMMUNITY)
Admission: RE | Admit: 2020-04-27 | Discharge: 2020-04-27 | Disposition: A | Discharge status: Home / Self Care | Payer: Medicaid Other | Source: Ambulatory Visit | Attending: Interventional Cardiology | Admitting: Interventional Cardiology

## 2020-04-27 ENCOUNTER — Encounter (HOSPITAL_COMMUNITY): Payer: Self-pay

## 2020-04-27 DIAGNOSIS — R072 Precordial pain: Secondary | ICD-10-CM

## 2020-04-27 MED ORDER — IOHEXOL 350 MG/ML SOLN
160.0000 mL | Freq: Once | INTRAVENOUS | Status: AC | PRN
  Administered 2020-04-27: 160 mL via INTRAVENOUS

## 2020-04-27 MED ORDER — NITROGLYCERIN 0.4 MG SL SUBL
SUBLINGUAL_TABLET | SUBLINGUAL | Status: AC
  Filled 2020-04-27: qty 2

## 2020-04-27 MED ORDER — NITROGLYCERIN 0.4 MG SL SUBL
0.8000 mg | SUBLINGUAL_TABLET | Freq: Once | SUBLINGUAL | Status: AC
  Administered 2020-04-27: 0.8 mg via SUBLINGUAL

## 2020-05-01 ENCOUNTER — Encounter: Payer: Self-pay | Admitting: Emergency Medicine

## 2020-05-01 ENCOUNTER — Other Ambulatory Visit: Payer: Self-pay | Admitting: Emergency Medicine

## 2020-05-01 ENCOUNTER — Ambulatory Visit: Payer: No Typology Code available for payment source | Admitting: Emergency Medicine

## 2020-05-01 ENCOUNTER — Other Ambulatory Visit: Payer: Self-pay

## 2020-05-01 VITALS — BP 138/81 | HR 66 | Temp 98.2°F | Resp 16 | Wt 153.0 lb

## 2020-05-01 DIAGNOSIS — Z23 Encounter for immunization: Secondary | ICD-10-CM | POA: Diagnosis not present

## 2020-05-01 DIAGNOSIS — E785 Hyperlipidemia, unspecified: Secondary | ICD-10-CM | POA: Diagnosis not present

## 2020-05-01 DIAGNOSIS — E1165 Type 2 diabetes mellitus with hyperglycemia: Secondary | ICD-10-CM | POA: Diagnosis not present

## 2020-05-01 DIAGNOSIS — E1169 Type 2 diabetes mellitus with other specified complication: Secondary | ICD-10-CM | POA: Diagnosis not present

## 2020-05-01 DIAGNOSIS — Z8673 Personal history of transient ischemic attack (TIA), and cerebral infarction without residual deficits: Secondary | ICD-10-CM | POA: Diagnosis not present

## 2020-05-01 DIAGNOSIS — Z7689 Persons encountering health services in other specified circumstances: Secondary | ICD-10-CM

## 2020-05-01 LAB — COMPREHENSIVE METABOLIC PANEL
ALT: 24 IU/L (ref 0–44)
AST: 17 IU/L (ref 0–40)
Albumin/Globulin Ratio: 1.9 (ref 1.2–2.2)
Albumin: 4.4 g/dL (ref 3.8–4.9)
Alkaline Phosphatase: 105 IU/L (ref 44–121)
BUN/Creatinine Ratio: 17 (ref 9–20)
BUN: 15 mg/dL (ref 6–24)
Bilirubin Total: 0.3 mg/dL (ref 0.0–1.2)
CO2: 23 mmol/L (ref 20–29)
Calcium: 9.3 mg/dL (ref 8.7–10.2)
Chloride: 101 mmol/L (ref 96–106)
Creatinine, Ser: 0.89 mg/dL (ref 0.76–1.27)
GFR calc Af Amer: 110 mL/min/{1.73_m2} (ref >59)
GFR calc non Af Amer: 96 mL/min/{1.73_m2} (ref >59)
Globulin, Total: 2.3 g/dL (ref 1.5–4.5)
Glucose: 209 mg/dL — ABNORMAL HIGH (ref 65–99)
Potassium: 4.8 mmol/L (ref 3.5–5.2)
Sodium: 138 mmol/L (ref 134–144)
Total Protein: 6.7 g/dL (ref 6.0–8.5)

## 2020-05-01 LAB — LIPID PANEL
Chol/HDL Ratio: 3.8 ratio (ref 0.0–5.0)
Cholesterol, Total: 169 mg/dL (ref 100–199)
HDL: 45 mg/dL (ref >39)
LDL Chol Calc (NIH): 91 mg/dL (ref 0–99)
Triglycerides: 196 mg/dL — ABNORMAL HIGH (ref 0–149)
VLDL Cholesterol Cal: 33 mg/dL (ref 5–40)

## 2020-05-01 LAB — POCT GLYCOSYLATED HEMOGLOBIN (HGB A1C): Hemoglobin A1C: 8.5 % — AB (ref 4.0–5.6)

## 2020-05-01 LAB — GLUCOSE, POCT (MANUAL RESULT ENTRY): POC Glucose: 208 mg/dl — AB (ref 70–99)

## 2020-05-01 MED ORDER — SITAGLIPTIN PHOSPHATE 50 MG PO TABS: 50.0000 mg | ORAL_TABLET | Freq: Every day | ORAL | 3 refills | Status: DC

## 2020-05-01 MED FILL — JANUVIA 50 MG TABLET: 50 | 90 days supply | Qty: 90 | Fill #0

## 2020-05-01 NOTE — Progress Notes (Signed)
[name] 55 y.o.   Chief Complaint  Patient presents with  . Establish Care  . Diabetes    HISTORY OF PRESENT ILLNESS: This is a 55 y.o. male here to establish care with me.  First visit. Has history of diabetes and high cholesterol. #1 diabetes: On Metformin 1000 mg twice a day and glipizide 10 mg twice a day. #2 dyslipidemia: On atorvastatin 80 mg daily. Also takes 1 baby aspirin a day. Status post stroke in February 2019 with residual right-sided hemiparesis. Ex smoker. Fully vaccinated against COVID with a booster. No other complaints or medical concerns today.  HPI   Prior to Admission medications   Medication Sig Start Date End Date Taking? Authorizing Provider  aspirin EC 81 MG EC tablet Take 1 tablet (81 mg total) by mouth daily. 05/21/18  Yes Angiulli, Lavon Paganini, PA-C  atorvastatin (LIPITOR) 80 MG tablet TAKE 1 TABLET (80 MG TOTAL) BY MOUTH DAILY. 01/25/20  Yes Isaac Bliss, Rayford Halsted, MD  glipiZIDE (GLUCOTROL) 10 MG tablet Take 1 tablet (10 mg total) by mouth 2 (two) times daily before a meal. 12/21/18  Yes Isaac Bliss, Rayford Halsted, MD  metFORMIN (GLUCOPHAGE) 1000 MG tablet TAKE 1 TABLET (1,000 MG TOTAL) BY MOUTH 2 TIMES DAILY WITH A MEAL. 07/19/19  Yes Erline Hau, MD    No Known Allergies  Patient Active Problem List   Diagnosis Date Noted  . 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 Left pontine cerebrovascular accident ([place]) 05/11/2018  . Tobacco abuse   . Uncontrolled type 2 diabetes mellitus with hyperglycemia ([place])   . CVA (cerebral vascular accident) ([place]) 05/06/2018  . Hyperlipidemia 05/30/2017    Past Medical History:  Diagnosis Date  . Diabetes mellitus without complication ([place]) 8938  . Hyperlipidemia   . Stroke [facility]) 05/06/2018   R sided weakness    Past Surgical History:  Procedure Laterality Date  . ANKLE SURGERY      Social History   Socioeconomic History  .  Marital status: Married    Spouse name: Verdis Frederickson  . Number of children: 7  . Years of education: Not on file  . Highest education level: 6th grade  Occupational History  . Occupation: Sales executive    Comment: [employer]  Tobacco Use  . Smoking status: Former Smoker    Types: Cigarettes  . Smokeless tobacco: Never Used  . Tobacco comment: never a daily smoker  Vaping Use  . Vaping Use: Never used  Substance and Sexual Activity  . Alcohol use: No    Alcohol/week: 0.0 standard drinks  . Drug use: No  . Sexual activity: Not on file  Other Topics Concern  . Not on file  Social History Narrative   Works with Sales executive. Lives in [place]. Moved from [place] 35 years ago. He is married. Has 7 children (all girls).       Patient is right-handed. He lives with his wife in a one level home. He walks daily for exercise.   Social Determinants of Health   Financial Resource Strain: Not on file  Food Insecurity: Not on file  Transportation Needs: Not on file  Physical Activity: Not on file  Stress: Not on file  Social Connections: Not on file  Intimate Partner Violence: Not on file    Family History  Problem Relation Age of Onset  . Heart disease Mother   . Lung cancer Father   . Diabetes Sister   .  Diabetes Brother   . CVA Neg Hx   . Colon cancer Neg Hx   . Colon polyps Neg Hx   . Esophageal cancer Neg Hx   . Rectal cancer Neg Hx   . Stomach cancer Neg Hx      Review of Systems  Constitutional: Negative.  Negative for chills and fever.  HENT: Negative.  Negative for congestion and sore throat.   Respiratory: Negative.  Negative for cough and shortness of breath.   Cardiovascular: Negative.  Negative for chest pain and palpitations.  Gastrointestinal: Negative.  Negative for abdominal pain, nausea and vomiting.  Genitourinary: Negative.  Negative for dysuria.  Musculoskeletal: Negative.  Negative for myalgias and neck pain.  Skin: Negative.  Negative for rash.  Neurological:  Negative.  Negative for dizziness and headaches.  All other systems reviewed and are negative.    Today's Vitals   05/01/20 0824  BP: 138/81  Pulse: 66  Resp: 16  Temp: 98.2 F (36.8 C)  TempSrc: Temporal  SpO2: 97%  Weight: 153 lb (69.4 kg)   Body mass index is 26.26 kg/m.   Physical Exam Vitals reviewed.  Constitutional:      Appearance: Normal appearance.  HENT:     Head: Normocephalic.  Eyes:     Extraocular Movements: Extraocular movements intact.     Pupils: Pupils are equal, round, and reactive to light.  Cardiovascular:     Rate and Rhythm: Normal rate and regular rhythm.     Pulses: Normal pulses.     Heart sounds: Normal heart sounds.  Pulmonary:     Effort: Pulmonary effort is normal.     Breath sounds: Normal breath sounds.  Musculoskeletal:        General: Normal range of motion.     Cervical back: Normal range of motion and neck supple.  Skin:    General: Skin is warm and dry.     Capillary Refill: Capillary refill takes less than 2 seconds.  Neurological:     Mental Status: He is alert and oriented to person, place, and time.     Motor: Weakness (Residual right arm and right leg weakness) present.  Psychiatric:        Mood and Affect: Mood normal.        Behavior: Behavior normal.      Results for orders placed or performed in visit on 05/01/20 (from the past 24 hour(s))  POCT glucose (manual entry)     Status: Abnormal   Collection Time: 05/01/20  8:36 AM  Result Value Ref Range   POC Glucose 208 (A) 70 - 99 mg/dl  POCT glycosylated hemoglobin (Hb A1C)     Status: Abnormal   Collection Time: 05/01/20  8:43 AM  Result Value Ref Range   Hemoglobin A1C 8.5 (A) 4.0 - 5.6 %   HbA1c POC (<> result, manual entry)     HbA1c, POC (prediabetic range)     HbA1c, POC (controlled diabetic range)      A total of 45 minutes was spent with the patient, greater than 50% of which was in counseling/coordination of care regarding establishing care with me,  diabetes and dyslipidemia and cardiovascular risks associated with these conditions, review of all medications and changes made, education on nutrition, review of most recent office visit notes from previous PCP, review of most recent blood work results including today's hemoglobin A1c, health maintenance items, prognosis, documentation, and need for follow-up in 3 months.  ASSESSMENT & PLAN: Dyslipidemia associated with  type 2 diabetes mellitus ([place]) Uncontrolled diabetes with hemoglobin A1c at 8.5.  Seems to be compliant with medications.  Diet and nutrition discussed. Continue Metformin 1000 mg twice a day and glipizide 10 mg twice a day. We will add Januvia 50 mg daily. Follow-up in 3 months.  Rydan was seen today for establish care and diabetes.  Diagnoses and all orders for this visit:  Uncontrolled type 2 diabetes mellitus with hyperglycemia ([place]) -     Comprehensive metabolic panel -     Lipid panel -     POCT glucose (manual entry) -     POCT glycosylated hemoglobin (Hb A1C) -     sitaGLIPtin (JANUVIA) 50 MG tablet; Take 1 tablet (50 mg total) by mouth daily.  Encounter to establish care  Need for prophylactic vaccination and inoculation against influenza -     Flu Vaccine QUAD 36+ mos IM  Dyslipidemia associated with type 2 diabetes mellitus ([place])  History of stroke    Patient Instructions       If you have lab work done today you will be contacted with your lab results within the next 2 weeks.  If you have not heard from [place] then please contact us. The fastest way to get your results is to register for My Chart.   IF you received an x-ray today, you will receive an invoice from [facility]. Please contact [facility] at [phone] with questions or concerns regarding your invoice.   IF you received labwork today, you will receive an invoice from [employer]. Please contact [facility] at [phone] with questions or concerns regarding your invoice.    Our billing staff will not be able to assist you with questions regarding bills from these companies.  You will be contacted with the lab results as soon as they are available. The fastest way to get your results is to activate your My Chart account. Instructions are located on the last page of this paperwork. If you have not heard from [place] regarding the results in 2 weeks, please contact this office.     Diabetes mellitus y nutricin, en adultos Diabetes Mellitus and Nutrition, Adult Si sufre de diabetes, o diabetes mellitus, es muy importante tener hbitos alimenticios saludables debido a que sus niveles de Designer, television/film set sangre (glucosa) se ven afectados en gran medida por lo que come y bebe. [place] alimentos saludables en las cantidades correctas, aproximadamente a la misma hora todos los [place], [place] ayudar a:  Aeronautical engineer glucemia.  Disminuir el riesgo de sufrir una enfermedad cardaca.  Mejorar la presin arterial.  Science writer o mantener un peso saludable. Qu puede afectar mi plan de alimentacin? Todas las personas que sufren de diabetes son diferentes y cada una tiene necesidades diferentes en cuanto a un plan de alimentacin. El mdico puede recomendarle que trabaje con un nutricionista para elaborar el mejor plan para usted. Su plan de alimentacin puede variar segn factores como:  Las caloras que necesita.  Los medicamentos que toma.  Su peso.  Sus niveles de glucemia, presin arterial y colesterol.  Su nivel de [place].  Otras afecciones que tenga, como enfermedades cardacas o renales. Cmo me afectan los carbohidratos? Los carbohidratos, o hidratos de carbono, afectan su nivel de glucemia ms que cualquier otro tipo de alimento. La ingesta de carbohidratos naturalmente aumenta la cantidad de [employer]. El recuento de carbohidratos es un mtodo destinado a Catering manager un registro de la cantidad de carbohidratos que se consumen. El recuento de carbohidratos  es  importante para Consulting civil engineer glucemia a un nivel saludable, especialmente si utiliza insulina o toma determinados medicamentos por va oral para la diabetes. Es importante conocer la cantidad de carbohidratos que se pueden ingerir en cada comida sin correr Engineer, manufacturing. Esto es Psychologist, forensic. Su nutricionista puede ayudarlo a calcular la cantidad de carbohidratos que debe ingerir en cada comida y en cada refrigerio. Cmo me afecta el alcohol? El alcohol puede provocar disminuciones sbitas de la glucemia (hipoglucemia), especialmente si utiliza insulina o toma determinados medicamentos por va oral para la diabetes. La hipoglucemia es una afeccin potencialmente mortal. Los sntomas de la hipoglucemia, como somnolencia, mareos y confusin, son similares a los sntomas de haber consumido demasiado alcohol.  No beba alcohol si: ? Su mdico le indica no hacerlo. ? Est embarazada, puede estar embarazada o est tratando de quedar embarazada.  Si bebe alcohol: ? No beba con el estmago vaco. ? Limite la cantidad que bebe:  De 0 a 1 medida por da para las mujeres.  De 0 a 2 medidas por da para los hombres. ? Est atento a la cantidad de alcohol que hay en las bebidas que toma. En los [place], una medida equivale a una botella de cerveza de 12oz (350ml), un vaso de vino de 5oz (166ml) o un vaso de una bebida alcohlica de alta graduacin de 1oz (43ml). ? Mantngase hidratado bebiendo agua, refrescos dietticos o t helado sin azcar.  Tenga en cuenta que los refrescos comunes, los jugos y otras bebida para Optician, dispensing pueden contener mucha azcar y se deben contar como carbohidratos. Consejos para seguir Photographer las etiquetas de los alimentos  Comience por leer el tamao de la porcin en la "Informacin nutricional" en las etiquetas de los alimentos envasados y las bebidas. La cantidad de caloras, carbohidratos, grasas y otros nutrientes mencionados en la etiqueta se  basan en una porcin del alimento. Muchos alimentos contienen ms de una porcin por envase.  Verifique la cantidad total de gramos (g) de carbohidratos totales en una porcin. Puede calcular la cantidad de porciones de carbohidratos al dividir el total de carbohidratos por 15. Por ejemplo, si un alimento tiene un total de 30g de carbohidratos totales por porcin, equivale a 2 porciones de carbohidratos.  Verifique la cantidad de gramos (g) de grasas saturadas y grasas trans de una porcin. Escoja alimentos que no contengan estas grasas o que su contenido de estas sea [place].  Verifique la cantidad de miligramos (mg) de sal (sodio) en una porcin. La mayora de las personas deben limitar la ingesta de sodio total a menos de 2300mg  por Training and development officer.  Siempre consulte la informacin nutricional de los alimentos etiquetados como "con bajo contenido de grasa" o "sin grasa". Estos alimentos pueden tener un mayor contenido de Location manager agregada o carbohidratos refinados, y deben evitarse.  Hable con su nutricionista para identificar sus objetivos diarios en cuanto a los nutrientes mencionados en la etiqueta. Al ir de compras  Evite comprar alimentos procesados, enlatados o precocidos. Estos alimentos tienden a Special educational needs teacher mayor cantidad de [place], sodio y azcar agregada.  Compre en la zona exterior de la tienda de comestibles. Esta es la zona donde se encuentran con mayor frecuencia las frutas y las verduras frescas, los cereales a granel, las carnes frescas y los productos lcteos frescos. Al cocinar  Utilice mtodos de coccin a baja temperatura, como hornear, en lugar de mtodos de coccin a alta temperatura, como frer en abundante aceite.  Cocine con  aceites saludables, como el aceite de [place], canola o [place].  Evite cocinar con manteca, crema o carnes con alto contenido de grasa. Planificacin de las comidas  Coma las comidas y los refrigerios regularmente, preferentemente a la misma hora todos [place]. Evite pasar largos perodos de tiempo sin comer.  Consuma alimentos ricos en fibra, como frutas frescas, verduras, frijoles y cereales integrales. Consulte a su nutricionista sobre cuntas porciones de carbohidratos puede consumir en cada comida.  Consuma entre 4 y 6 onzas (entre 112 y 168g) de protenas magras por da, como carnes magras, pollo, pescado, huevos o tofu. Una onza (oz) de protena magra equivale a: ? 1 onza (28g) de carne, pollo o pescado. ? 1huevo. ?  de taza (62 g) de tofu.  Coma algunos alimentos por da que contengan grasas saludables, como aguacates, frutos secos, semillas y pescado.   Qu alimentos debo comer? Lambert Mody Bayas. Manzanas. Naranjas. Duraznos. Damascos. Ciruelas. Uvas. Mango. Papaya. [place]. Kiwi. Cerezas. [place] Valeda Malm. [place]. Verduras de [employer], que incluyen col rizada, [place], hojas de [place] y de [place]. Remolachas. Coliflor. Repollo. Brcoli. Zanahorias. Judas verdes. Tomates. Pimientos. Cebollas. Pepinos. Coles de Bruselas. Granos Granos integrales, como panes, galletas, tortillas, cereales y pastas de salvado o integrales. Avena sin azcar. Quinua. Arroz integral o salvaje. Carnes y Psychiatric nurse. Carne de ave sin piel. Cortes magros de ave y carne de res. Tofu. Frutos secos. Semillas. Lcteos Productos lcteos sin grasa o con bajo contenido de [place], [place], yogur y [place]. Es posible que los productos que se enumeran ms [place] no constituyan una lista completa de los alimentos y las bebidas que puede tomar. Consulte a un nutricionista para obtener ms informacin. Qu alimentos debo evitar? Lambert Mody Frutas enlatadas al almbar. Verduras Verduras enlatadas. Verduras congeladas con mantequilla o salsa de crema. Granos Productos elaborados con [place] y [place], como panes, pastas, bocadillos y cereales. Evite todos los alimentos procesados. Carnes y otras protenas Cortes de carne con alto contenido  de Lobbyist. Carne de ave con piel. Carnes empanizadas o fritas. Carne procesada. Evite las grasas saturadas. Lcteos Yogur, [address] enteros. Bebidas Bebidas azucaradas, como gaseosas o t helado. Es posible que los productos que se enumeran ms [place] no constituyan una lista completa de los alimentos y las bebidas que Nurse, adult. Consulte a un nutricionista para obtener ms informacin. Preguntas para hacerle al mdico  Es necesario que me rena con Radio broadcast assistant en el cuidado de la diabetes?  Es necesario que me rena con un nutricionista?  A qu nmero puedo llamar si tengo preguntas?  Cules son los mejores momentos para controlar la glucemia? Dnde encontrar ms informacin:  Asociacin Estadounidense de la Diabetes (American Diabetes Association): diabetes.org  Academy of Nutrition and Dietetics (Academia de Nutricin y Information systems manager): www.eatright.[employer]X Corporation of Diabetes and Digestive and Kidney Diseases ([place] la Diabetes y [place] y [place]): DesMoinesFuneral.dk  Association of Diabetes Care and Education Specialists (Asociacin de Especialistas en Atencin y Educacin sobre la Diabetes): www.diabeteseducator.org Resumen  Es importante tener hbitos alimenticios saludables debido a que sus niveles de Designer, television/film set sangre (glucosa) se ven afectados en gran medida por lo que come y bebe.  Un plan de alimentacin saludable lo ayudar a controlar la glucemia y Theatre manager un estilo de vida saludable.  El mdico puede recomendarle que trabaje con un nutricionista para elaborar el mejor plan para usted.  Tenga en cuenta que los carbohidratos (hidratos de carbono) y el alcohol  tienen efectos inmediatos en sus niveles de glucemia. Es importante contar los carbohidratos que ingiere y consumir alcohol con prudencia. Esta informacin no tiene Marine scientist el consejo del mdico. Asegrese de hacerle al mdico cualquier pregunta que  tenga. Document Revised: 04/14/2019 Document Reviewed: 04/14/2019 Elsevier Patient Education  2021 Elsevier Inc.      Agustina Caroli, MD [facility]

## 2020-05-01 NOTE — Assessment & Plan Note (Signed)
Uncontrolled diabetes with hemoglobin A1c at 8.5.  Seems to be compliant with medications.  Diet and nutrition discussed. Continue Metformin 1000 mg twice a day and glipizide 10 mg twice a day. We will add Januvia 50 mg daily. Follow-up in 3 months.

## 2020-05-01 NOTE — Patient Instructions (Addendum)
If you have lab work done today you will be contacted with your lab results within the next 2 weeks.  If you have not heard from [place] then please contact us. The fastest way to get your results is to register for My Chart.   IF you received an x-ray today, you will receive an invoice from [facility]. Please contact [facility] at [phone] with questions or concerns regarding your invoice.   IF you received labwork today, you will receive an invoice from [facility]. Please contact [facility] at [phone] with questions or concerns regarding your invoice.   Our billing staff will not be able to assist you with questions regarding bills from these companies.  You will be contacted with the lab results as soon as they are available. The fastest way to get your results is to activate your My Chart account. Instructions are located on the last page of this paperwork. If you have not heard from [place] regarding the results in 2 weeks, please contact this office.     Diabetes mellitus y nutricin, en adultos Diabetes Mellitus and Nutrition, Adult Si sufre de diabetes, o diabetes mellitus, es muy importante tener hbitos alimenticios saludables debido a que sus niveles de Designer, television/film set sangre (glucosa) se ven afectados en gran medida por lo que come y bebe. [place] alimentos saludables en las cantidades correctas, aproximadamente a la misma hora todos los [place], [place] ayudar a:  Aeronautical engineer glucemia.  Disminuir el riesgo de sufrir una enfermedad cardaca.  Mejorar la presin arterial.  Science writer o mantener un peso saludable. Qu puede afectar mi plan de alimentacin? Todas las personas que sufren de diabetes son diferentes y cada una tiene necesidades diferentes en cuanto a un plan de alimentacin. El mdico puede recomendarle que trabaje con un nutricionista para elaborar el mejor plan para usted. Su plan de alimentacin puede variar segn factores como:  Las caloras que  necesita.  Los medicamentos que toma.  Su peso.  Sus niveles de glucemia, presin arterial y colesterol.  Su nivel de [place].  Otras afecciones que tenga, como enfermedades cardacas o renales. Cmo me afectan los carbohidratos? Los carbohidratos, o hidratos de carbono, afectan su nivel de glucemia ms que cualquier otro tipo de alimento. La ingesta de carbohidratos naturalmente aumenta la cantidad de [employer]. El recuento de carbohidratos es un mtodo destinado a Catering manager un registro de la cantidad de carbohidratos que se consumen. El recuento de carbohidratos es importante para Theatre manager la glucemia a un nivel saludable, especialmente si utiliza insulina o toma determinados medicamentos por va oral para la diabetes. Es importante conocer la cantidad de carbohidratos que se pueden ingerir en cada comida sin correr Engineer, manufacturing. Esto es Psychologist, forensic. Su nutricionista puede ayudarlo a calcular la cantidad de carbohidratos que debe ingerir en cada comida y en cada refrigerio. Cmo me afecta el alcohol? El alcohol puede provocar disminuciones sbitas de la glucemia (hipoglucemia), especialmente si utiliza insulina o toma determinados medicamentos por va oral para la diabetes. La hipoglucemia es una afeccin potencialmente mortal. Los sntomas de la hipoglucemia, como somnolencia, mareos y confusin, son similares a los sntomas de haber consumido demasiado alcohol.  No beba alcohol si: ? Su mdico le indica no hacerlo. ? Est embarazada, puede estar embarazada o est tratando de quedar embarazada.  Si bebe alcohol: ? No beba con el estmago vaco. ? Limite la cantidad que bebe:  De 0 a 1 medida por da para las  mujeres.  De 0 a 2 medidas por da para los hombres. ? Est atento a la cantidad de alcohol que hay en las bebidas que toma. En los [place], una medida equivale a una botella de cerveza de 12oz (361ml), un vaso de vino de 5oz (117ml) o un vaso  de una bebida alcohlica de alta graduacin de 1oz (39ml). ? Mantngase hidratado bebiendo agua, refrescos dietticos o t helado sin azcar.  Tenga en cuenta que los refrescos comunes, los jugos y otras bebida para Optician, dispensing pueden contener mucha azcar y se deben contar como carbohidratos. Consejos para seguir Photographer las etiquetas de los alimentos  Comience por leer el tamao de la porcin en la "Informacin nutricional" en las etiquetas de los alimentos envasados y las bebidas. La cantidad de caloras, carbohidratos, grasas y otros nutrientes mencionados en la etiqueta se basan en una porcin del alimento. Muchos alimentos contienen ms de una porcin por envase.  Verifique la cantidad total de gramos (g) de carbohidratos totales en una porcin. Puede calcular la cantidad de porciones de carbohidratos al dividir el total de carbohidratos por 15. Por ejemplo, si un alimento tiene un total de 30g de carbohidratos totales por porcin, equivale a 2 porciones de carbohidratos.  Verifique la cantidad de gramos (g) de grasas saturadas y grasas trans de una porcin. Escoja alimentos que no contengan estas grasas o que su contenido de estas sea [place].  Verifique la cantidad de miligramos (mg) de sal (sodio) en una porcin. La mayora de las personas deben limitar la ingesta de sodio total a menos de 2300mg  por Training and development officer.  Siempre consulte la informacin nutricional de los alimentos etiquetados como "con bajo contenido de grasa" o "sin grasa". Estos alimentos pueden tener un mayor contenido de Location manager agregada o carbohidratos refinados, y deben evitarse.  Hable con su nutricionista para identificar sus objetivos diarios en cuanto a los nutrientes mencionados en la etiqueta. Al ir de compras  Evite comprar alimentos procesados, enlatados o precocidos. Estos alimentos tienden a Special educational needs teacher mayor cantidad de [place], sodio y azcar agregada.  Compre en la zona exterior de la tienda de comestibles. Esta es  la zona donde se encuentran con mayor frecuencia las frutas y las verduras frescas, los cereales a granel, las carnes frescas y los productos lcteos frescos. Al cocinar  Utilice mtodos de coccin a baja temperatura, como hornear, en lugar de mtodos de coccin a alta temperatura, como frer en abundante aceite.  Cocine con aceites saludables, como el aceite de [place], canola o [place].  Evite cocinar con manteca, crema o carnes con alto contenido de grasa. Planificacin de las comidas  Coma las comidas y los refrigerios regularmente, preferentemente a la misma hora todos [place]. Evite pasar largos perodos de tiempo sin comer.  Consuma alimentos ricos en fibra, como frutas frescas, verduras, frijoles y cereales integrales. Consulte a su nutricionista sobre cuntas porciones de carbohidratos puede consumir en cada comida.  Consuma entre 4 y 6 onzas (entre 112 y 168g) de protenas magras por da, como carnes magras, pollo, pescado, huevos o tofu. Una onza (oz) de protena magra equivale a: ? 1 onza (28g) de carne, pollo o pescado. ? 1huevo. ?  de taza (62 g) de tofu.  Coma algunos alimentos por da que contengan grasas saludables, como aguacates, frutos secos, semillas y pescado.   Qu alimentos debo comer? Lambert Mody Bayas. Manzanas. Naranjas. Duraznos. Damascos. Ciruelas. Uvas. Mango. Papaya. [place]. Kiwi. Cerezas. [place] Valeda Malm. [place]. Verduras de [employer], que incluyen  col rizada, acelga, hojas de berza y de [place]. Remolachas. Coliflor. Repollo. Brcoli. Zanahorias. Judas verdes. Tomates. Pimientos. Cebollas. Pepinos. Coles de Bruselas. Granos Granos integrales, como panes, galletas, tortillas, cereales y pastas de salvado o integrales. Avena sin azcar. Quinua. Arroz integral o salvaje. Carnes y Psychiatric nurse. Carne de ave sin piel. Cortes magros de ave y carne de res. Tofu. Frutos secos. Semillas. Lcteos Productos lcteos sin grasa o con bajo contenido de  [place], [place], yogur y [place]. Es posible que los productos que se enumeran ms [place] no constituyan una lista completa de los alimentos y las bebidas que puede tomar. Consulte a un nutricionista para obtener ms informacin. Qu alimentos debo evitar? Lambert Mody Frutas enlatadas al almbar. Verduras Verduras enlatadas. Verduras congeladas con mantequilla o salsa de crema. Granos Productos elaborados con [place] y [place], como panes, pastas, bocadillos y cereales. Evite todos los alimentos procesados. Carnes y otras protenas Cortes de carne con alto contenido de Lobbyist. Carne de ave con piel. Carnes empanizadas o fritas. Carne procesada. Evite las grasas saturadas. Lcteos Yogur, [employer] enteros. Bebidas Bebidas azucaradas, como gaseosas o t helado. Es posible que los productos que se enumeran ms [place] no constituyan una lista completa de los alimentos y las bebidas que Nurse, adult. Consulte a un nutricionista para obtener ms informacin. Preguntas para hacerle al mdico  Es necesario que me rena con Radio broadcast assistant en el cuidado de la diabetes?  Es necesario que me rena con un nutricionista?  A qu nmero puedo llamar si tengo preguntas?  Cules son los mejores momentos para controlar la glucemia? Dnde encontrar ms informacin:  Asociacin Estadounidense de la Diabetes (American Diabetes Association): diabetes.org  Academy of Nutrition and Dietetics (Academia de Nutricin y Information systems manager): www.eatright.CSX Corporation of Diabetes and Digestive and Kidney Diseases ([place] la Diabetes y [place] y [place]): DesMoinesFuneral.dk  Association of Diabetes Care and Education Specialists (Asociacin de Especialistas en Atencin y Educacin sobre la Diabetes): www.diabeteseducator.org Resumen  Es importante tener hbitos alimenticios saludables debido a que sus niveles de Designer, television/film set sangre (glucosa) se ven afectados en  gran medida por lo que come y bebe.  Un plan de alimentacin saludable lo ayudar a controlar la glucemia y Theatre manager un estilo de vida saludable.  El mdico puede recomendarle que trabaje con un nutricionista para elaborar el mejor plan para usted.  Tenga en cuenta que los carbohidratos (hidratos de carbono) y el alcohol tienen efectos inmediatos en sus niveles de glucemia. Es importante contar los carbohidratos que ingiere y consumir alcohol con prudencia. Esta informacin no tiene Marine scientist el consejo del mdico. Asegrese de hacerle al mdico cualquier pregunta que tenga. Document Revised: 04/14/2019 Document Reviewed: 04/14/2019 Elsevier Patient Education  2021 [employer].

## 2020-05-01 NOTE — Assessment & Plan Note (Signed)
>>  ASSESSMENT AND PLAN FOR DIABETES MELLITUS (HCC) WRITTEN ON 05/01/2020  8:52 AM BY SAGARDIA, MIGUEL JOSE, MD  Uncontrolled diabetes with hemoglobin A1c at 8.5.  Seems to be compliant with medications.  Diet and nutrition discussed. Continue Metformin  1000 mg twice a day and glipizide  10 mg twice a day. We will add Januvia  50 mg daily. Follow-up in 3 months.

## 2020-05-02 DIAGNOSIS — R931 Abnormal findings on diagnostic imaging of heart and coronary circulation: Secondary | ICD-10-CM | POA: Diagnosis not present

## 2020-05-02 DIAGNOSIS — R072 Precordial pain: Secondary | ICD-10-CM | POA: Diagnosis not present

## 2020-05-17 ENCOUNTER — Other Ambulatory Visit (HOSPITAL_COMMUNITY): Payer: Self-pay

## 2020-05-18 ENCOUNTER — Other Ambulatory Visit (HOSPITAL_COMMUNITY): Payer: Self-pay

## 2020-05-18 MED FILL — AMOXICILLIN 500 MG CAPSULE: 500 | 8 days supply | Qty: 25 | Fill #0

## 2020-05-18 MED FILL — IBUPROFEN 800 MG TABS: 800 | 5 days supply | Qty: 15 | Fill #0

## 2020-05-22 ENCOUNTER — Telehealth: Payer: Self-pay | Admitting: Emergency Medicine

## 2020-05-22 ENCOUNTER — Other Ambulatory Visit: Payer: Self-pay | Admitting: Emergency Medicine

## 2020-05-22 MED ORDER — METFORMIN HCL 500 MG PO TABS: 500.0000 mg | ORAL_TABLET | Freq: Two times a day (BID) | ORAL | 3 refills | Status: DC

## 2020-05-22 MED FILL — METFORMIN HCL 500 MG TABS: 500 | 90 days supply | Qty: 180 | Fill #0

## 2020-05-22 NOTE — Telephone Encounter (Signed)
Patient's daughter Lattie Haw called to request lower dose of Metformin for patient; dose recently increased and caused patient to lose his appetite.   Lattie Haw stated Metformin was discussed with provider at last visit on 2/8; a different provider prescribed it initially. Lattie Haw stated that our provider instructed patient to call back to request an adjustment if needed.  Pharmacy:   [place], [place] - [facility].  [address] [place] [address]  Phone:  [phone] Fax:  [phone]  Please advise at [phone]

## 2020-05-22 NOTE — Telephone Encounter (Signed)
Pt saw you 05/01/2020  Was given Metformin by Angelique Blonder, MD and pt reports you told him he could call if there was a need for adjustment.   Pt requesting lower dose as he no longer has any appetite please advise

## 2020-05-22 NOTE — Telephone Encounter (Signed)
Prescription for Metformin 500 mg tablets twice a day sent to pharmacy of record.  Thanks.

## 2020-05-23 MED FILL — CHLORHEXIDINE 0.12% RINSE: 0.12 | 17 days supply | Qty: 473 | Fill #0

## 2020-05-23 MED FILL — ACETAMINOPHEN-COD #3 TABLET: 300-30 | 4 days supply | Qty: 15 | Fill #0

## 2020-05-28 ENCOUNTER — Other Ambulatory Visit: Payer: Self-pay | Admitting: Internal Medicine

## 2020-05-28 DIAGNOSIS — E7849 Other hyperlipidemia: Secondary | ICD-10-CM

## 2020-05-28 MED FILL — ATORVASTATIN 80 MG TABLET: 80 | 90 days supply | Qty: 90 | Fill #0

## 2020-05-28 NOTE — Progress Notes (Signed)
Cardiology Office Note   Date:  05/31/2020   ID:  Arthur Howard, Arthur Howard [birth year], MRN [id number]  PCP:  Horald Pollen, MD  Cardiologist:  Dr. Tamala Julian, MD   No chief complaint on file.   History of Present Illness: Arthur Howard is a 55 y.o. male who presents for follow up, seen for Dr. Tamala Julian.   Arthur Howard has a history of CVA, HLD and DM2.  Echocardiogram from 04/2018 with LVEF at 60 to 65% with normal diastolic parameters and no valvular disease.  He was seen in the ED 01/2020 for chest pain at which time he was set up for outpatient close follow-up.  He then saw Truitt Merle 01/25/2020 and was set up for coronary CTA.  This was performed 05/02/2020 with FFR analysis showing no significant stenosis with recommendations for aggressive medical management>>reassuring  Today he is here with his sister and an interpreter. He denies recurrent chest pain. He was recently seen by his PCP with a HbA1c at 8.5 and elevated triglycerides. We discussed in depth his CCTA results and all questions answered.    Past Medical History:  Diagnosis Date  . Diabetes mellitus without complication ([place]) 5462  . Hyperlipidemia   . Stroke [facility]) 05/06/2018   R sided weakness    Past Surgical History:  Procedure Laterality Date  . ANKLE SURGERY       Current Outpatient Medications  Medication Sig Dispense Refill  . aspirin EC 81 MG EC tablet Take 1 tablet (81 mg total) by mouth daily.    Marland Kitchen atorvastatin (LIPITOR) 80 MG tablet TAKE 1 TABLET (80 MG TOTAL) BY MOUTH DAILY. 90 tablet 0  . glipiZIDE (GLUCOTROL) 10 MG tablet Take 1 tablet (10 mg total) by mouth 2 (two) times daily before a meal. 180 tablet 1  . metFORMIN (GLUCOPHAGE) 500 MG tablet Take 1 tablet (500 mg total) by mouth 2 (two) times daily with a meal. 180 tablet 3  . omega-3 acid ethyl esters (LOVAZA) 1 g capsule Take 1 capsule (1 g total) by mouth daily. 90 capsule 3  . sitaGLIPtin (JANUVIA) 50 MG tablet Take 1 tablet (50 mg  total) by mouth daily. 90 tablet 3   No current facility-administered medications for this visit.    Allergies:   Patient has no known allergies.    Social History:  The patient  reports that he has quit smoking. His smoking use included cigarettes. He has never used smokeless tobacco. He reports that he does not drink alcohol and does not use drugs.   Family History:  The patient's family history includes Diabetes in his brother and sister; Heart disease in his mother; Lung cancer in his father.    ROS:  Please see the history of present illness.   Otherwise, review of systems are positive for none. All other systems are reviewed and negative.    PHYSICAL EXAM: VS:  BP 120/70 (BP Location: Left Arm, Patient Position: Sitting, Cuff Size: Normal)   Pulse 64   Ht 5\' 4"  (1.626 m)   Wt 153 lb (69.4 kg)   SpO2 99%   BMI 26.26 kg/m  , BMI Body mass index is 26.26 kg/m.   General: Well developed, well nourished, NAD Neck: Negative for carotid bruits. No JVD Lungs:Clear to ausculation bilaterally. No wheezes, rales, or rhonchi. Breathing is unlabored. Cardiovascular: RRR with S1 S2. No murmurs Extremities: No edema. Neuro: Alert and oriented.   EKG:  EKG is not ordered today.  Recent Labs: 01/24/2020: Hemoglobin 15.6; Platelets 316 05/01/2020: ALT 24; BUN 15; Creatinine, Ser 0.89; Potassium 4.8; Sodium 138    Lipid Panel    Component Value Date/Time   CHOL 169 05/01/2020 0925   TRIG 196 (H) 05/01/2020 0925   HDL 45 05/01/2020 0925   CHOLHDL 3.8 05/01/2020 0925   CHOLHDL 4 03/22/2019 0925   VLDL 36.2 03/22/2019 0925   LDLCALC 91 05/01/2020 0925      Wt Readings from Last 3 Encounters:  05/31/20 153 lb (69.4 kg)  05/01/20 153 lb (69.4 kg)  01/25/20 152 lb (68.9 kg)      Other studies Reviewed: Additional studies/ records that were reviewed today include: . Review of the above records demonstrates:   CTA with FFR analysis 05/02/2020:   IMPRESSION: 1. CT FFR  analysis didn't show any significant stenosis. Aggressive medical management is recommended.   ASSESSMENT AND PLAN:  1.  Previously seen for chest pain: -CTA performed 04/2020 with no significant coronary stenosis per FFR analysis. -Continue with risk factor modifications  2.  HLD: -LDL, 91 with Trigs at 196 -Continue statin -Add Lovaza   3.  Uncontrolled DM2: -Hemoglobin A1c, 8.5 -Management per primary care physician -Long discussion about ways to self care   4.  History of tobacco use: -Reports cessation  5.  Prior CVA: -No significant residual -Continue ASA, statin   Current medicines are reviewed at length with the patient today.  The patient does not have concerns regarding medicines.  The following changes have been made:  Add lovaza   Labs/ tests ordered today include: None  No orders of the defined types were placed in this encounter.  Disposition:   FU with Dr. Tamala Julian  in 1 year  Signed, Kathyrn Drown, NP  05/31/2020 3:42 PM    [facility] [place], [place], [place]  [address] Phone: [phone]; Fax: [phone]

## 2020-05-31 ENCOUNTER — Other Ambulatory Visit: Payer: Self-pay | Admitting: Cardiology

## 2020-05-31 ENCOUNTER — Other Ambulatory Visit: Payer: Self-pay

## 2020-05-31 ENCOUNTER — Ambulatory Visit (INDEPENDENT_AMBULATORY_CARE_PROVIDER_SITE_OTHER): Payer: Medicaid Other | Admitting: Cardiology

## 2020-05-31 ENCOUNTER — Encounter: Payer: Self-pay | Admitting: Cardiology

## 2020-05-31 VITALS — BP 120/70 | HR 64 | Ht 64.0 in | Wt 153.0 lb

## 2020-05-31 DIAGNOSIS — I6300 Cerebral infarction due to thrombosis of unspecified precerebral artery: Secondary | ICD-10-CM | POA: Diagnosis not present

## 2020-05-31 DIAGNOSIS — E1165 Type 2 diabetes mellitus with hyperglycemia: Secondary | ICD-10-CM

## 2020-05-31 DIAGNOSIS — E7849 Other hyperlipidemia: Secondary | ICD-10-CM

## 2020-05-31 DIAGNOSIS — I2510 Atherosclerotic heart disease of native coronary artery without angina pectoris: Secondary | ICD-10-CM

## 2020-05-31 MED ORDER — OMEGA-3-ACID ETHYL ESTERS 1 G PO CAPS: 1.0000 g | ORAL_CAPSULE | Freq: Every day | ORAL | 3 refills | Status: DC

## 2020-05-31 NOTE — Patient Instructions (Signed)
Medication Instructions:  Start Lovaza 1 gm daily   *If you need a refill on your cardiac medications before your next appointment, please call your pharmacy*   Lab Work: None ordered   If you have labs (blood work) drawn today and your tests are completely normal, you will receive your results only by: Marland Kitchen MyChart Message (if you have MyChart) OR . A paper copy in the mail If you have any lab test that is abnormal or we need to change your treatment, we will call you to review the results.   Testing/Procedures: None ordered    Follow-Up: At [facility], you and your health needs are our priority.  As part of our continuing mission to provide you with exceptional heart care, we have created designated Provider Care Teams.  These Care Teams include your primary Cardiologist (physician) and Advanced Practice Providers (APPs -  Physician Assistants and Nurse Practitioners) who all work together to provide you with the care you need, when you need it.  We recommend signing up for the patient portal called "MyChart".  Sign up information is provided on this After Visit Summary.  MyChart is used to connect with patients for Virtual Visits (Telemedicine).  Patients are able to view lab/test results, encounter notes, upcoming appointments, etc.  Non-urgent messages can be sent to your provider as well.   To learn more about what you can do with MyChart, go to [email or web address].    Your next appointment:   6 month(s)  The format for your next appointment:   In Person  Provider:   You may see Dr. Daneen Schick  or one of the following Advanced Practice Providers on your designated Care Team:    Kathyrn Drown, NP    Other Instructions None

## 2020-06-01 ENCOUNTER — Telehealth: Payer: Self-pay

## 2020-06-01 NOTE — Telephone Encounter (Signed)
**Note De-Identified  Obfuscation** Omega-3 Ethyl-Esters PA started through covermymeds. Key: [id number]

## 2020-06-05 ENCOUNTER — Other Ambulatory Visit: Payer: Self-pay | Admitting: Interventional Cardiology

## 2020-06-05 ENCOUNTER — Other Ambulatory Visit: Payer: Self-pay | Admitting: *Deleted

## 2020-06-05 MED ORDER — OMEGA-3-ACID ETHYL ESTERS 1 G PO CAPS
1.0000 g | ORAL_CAPSULE | Freq: Every day | ORAL | 3 refills | Status: DC
Start: 2020-06-05 — End: 2020-06-11

## 2020-06-07 NOTE — Telephone Encounter (Addendum)
**Note De-Identified  Obfuscation** I called [employer] to check progress of this Omea-3-Ethyl Esters PA and was advised by [place] that it has been denied.  Reason for denial: The pt has to try and fail all formulary medications on his plans list of covered medications which in this case are:  Fenofibrate and Gemfibrozil.  I am forwarding this phone note to Dr Tamala Julian and his nurse for advisement to the pt.

## 2020-06-08 NOTE — Telephone Encounter (Signed)
Dr. Tamala Julian patient.

## 2020-06-08 NOTE — Telephone Encounter (Signed)
I don't use Lovaza. I would like him to have VASCEPA. This has been proven to reduce CV risk. 2 Grams BID

## 2020-06-11 ENCOUNTER — Other Ambulatory Visit: Payer: Self-pay | Admitting: Interventional Cardiology

## 2020-06-11 MED ORDER — ICOSAPENT ETHYL 1 G PO CAPS: 2.0000 g | ORAL_CAPSULE | Freq: Two times a day (BID) | ORAL | 11 refills | Status: DC

## 2020-06-11 NOTE — Addendum Note (Signed)
Addended by: Loren Racer on: 06/11/2020 12:34 PM   Modules accepted: Orders

## 2020-06-11 NOTE — Telephone Encounter (Signed)
Spoke with daughter and made her aware of change in medication.  Advised I will place samples at the front desk for her to pick up.  Daughter verbalized understanding and was in agreement with plan.  Will send back to PA nurse Jeani Hawking Via as this medication will likely require one as well.

## 2020-06-18 NOTE — Telephone Encounter (Signed)
**Note De-Identified  Obfuscation** Letter/Vascepa form received from [employer]. This was a very detailed and difficult form to complete. I have completed and emailed all (14 pages-form and printed lab results, OV notes, and FDA literature) to Dr Darliss Ridgel nurse so she can obtain Dr Darliss Ridgel signature and to fax to [place] at number written on cover letter included or to place in nurses box in Medical Records to be faxed.

## 2020-06-22 NOTE — Telephone Encounter (Signed)
Paperwork completed and faxed.  °

## 2020-06-23 ENCOUNTER — Other Ambulatory Visit (HOSPITAL_COMMUNITY): Payer: Self-pay

## 2020-07-09 ENCOUNTER — Other Ambulatory Visit (HOSPITAL_COMMUNITY): Payer: Self-pay

## 2020-07-09 ENCOUNTER — Encounter: Payer: Self-pay | Admitting: Internal Medicine

## 2020-07-09 ENCOUNTER — Ambulatory Visit: Payer: Medicaid Other | Admitting: Internal Medicine

## 2020-07-09 ENCOUNTER — Other Ambulatory Visit: Payer: Self-pay

## 2020-07-09 VITALS — BP 119/73 | HR 64 | Temp 98.4°F | Ht 64.0 in | Wt 150.0 lb

## 2020-07-09 DIAGNOSIS — E785 Hyperlipidemia, unspecified: Secondary | ICD-10-CM | POA: Diagnosis not present

## 2020-07-09 DIAGNOSIS — E1169 Type 2 diabetes mellitus with other specified complication: Secondary | ICD-10-CM | POA: Diagnosis not present

## 2020-07-09 DIAGNOSIS — E1165 Type 2 diabetes mellitus with hyperglycemia: Secondary | ICD-10-CM | POA: Diagnosis present

## 2020-07-09 DIAGNOSIS — I69351 Hemiplegia and hemiparesis following cerebral infarction affecting right dominant side: Secondary | ICD-10-CM

## 2020-07-09 MED ORDER — SITAGLIP PHOS-METFORMIN HCL ER 50-500 MG PO TB24
1.0000 | ORAL_TABLET | Freq: Every day | ORAL | 3 refills | Status: DC
  Filled 2020-07-09: qty 30, 30d supply, fill #0

## 2020-07-09 NOTE — Patient Instructions (Addendum)
Thank you for allowing [place] to provide your care today. Today we discussed your diabetes    I have ordered urine microalbumin labs for you. I will call if any are abnormal.    Today we made the following changes to your medications.    Please start sitalgiptin-metformin ER 500mg  daily This medication will replace your Januvia and Metformin so you can stop those meds  Please follow-up in 1 month.    Should you have any questions or concerns please call the internal medicine clinic at [phone].     Diabetes mellitus e nutrio, adultos Diabetes Mellitus and Nutrition, Adult Quando voc sofre de diabetes, ou diabetes mellitus,  muito importante ter hbitos de alimentao saudveis, uma vez que os seus nveis de acar no sangue (glicose) so significativamente afetados pelo que voc come e bebe. Comer alimentos saudveis nas quantidades corretas e aproximadamente nos mesmos horrios todos os dias pode ajudar voc a:  Controlar seu nvel de glicose sangunea.  Reduzir seu risco de doena cardaca.  Melhorar sua presso arterial.  Alcanar ou manter um peso saudvel. O que pode influenciar meu plano alimentar? Todas as pessoas com diabetes so diferentes, e cada pessoa tem diferentes necessidades em termos de um plano de refeies. Seu mdico poder recomendar que voc consulte um nutricionista para elaborar o melhor plano de refeies para voc. Seu plano de refeies poder variar dependendo de fatores como:  As calorias de que voc necessita.  Os medicamentos que toma.  Seu peso.  Seus nveis de glicose sangunea, presso arterial e colesterol.  Seu nvel de atividade.  Outros quadros clnicos, como doena cardaca ou renal. Como os carboidratos me afetam? Os carboidratos, tambm chamado de acares, afetam o nvel de [employer] do que qualquer outro tipo de [place]. A ingesto de carboidratos aumenta naturalmente a quantidade de glicose no seu sangue. A  contagem de carboidratos  um mtodo para acompanhar a quantidade de carboidratos que voc ingere. A contagem de carboidratos  importante para manter sua glicose sangunea em um nvel saudvel, principalmente se voc [place] insulina ou toma certos medicamentos orais para o diabetes.  importante saber quanto de carboidrato voc pode ingerir com segurana em cada refeio. Isso varia de pessoa para pessoa. Seu nutricionista poder ajud-lo a calcular quanto de carboidrato voc deve consumir em cada refeio e em cada lanche. Como o lcool me afeta? O lcool pode causar uma sbita reduo do nvel de glicose sangunea (hipoglicemia), especialmente se voc usar insulina ou tomar certos medicamentos orais para o diabetes. A hipoglicemia pode ser um quadro clnico potencialmente fatal. Os sintomas de hipoglicemia, como sonolncia, vertigem e confuso, so parecidos com os sintomas da ingesto abusiva de lcool.  No beba lcool se: ? Seu mdico disser para voc no beber. ? Estiver Gordy Clement, puder estar grvida ou planejar engravidar.  Se voc consome lcool: ? No beba de estmago vazio. ? Limite seu consumo a:  0-1 dose para mulheres.  0-2 doses para homens. ? Observe quanto lcool sua bebida contm. Nos EUA, uma dose equivale a uma lata de cerveja de 12 oz (355 ml), uma taa de vinho de 5 oz (148 ml) ou uma dose de bebida destilada de 1 oz (44 ml). ? Mantenha sua hidratao com gua, refrigerante diet ou ch gelado sem acar.  Tenha em mente que o refrigerante normal, suco e outras bebidas podem conter muito acar e devem ser contadas como carboidratos. Quais so as dicas para seguir esse plano? [place]  Comece verificando o Higher education careers adviser poro nas "Informaes nutricionais" no rtulo de alimentos e bebidas embalados. A quantidade de calorias, carboidratos, gorduras e outros nutrientes listados no rtulo se baseia em uma poro padro do alimento. Muitos itens contm mais de  uma poro por embalagem.  Verifique o total de gramas (g) de carboidratos contidos em uma poro. Voc pode calcular o nmero de pores de carboidratos em uma poro dividindo o total de carboidratos por 15. Por exemplo: supondo que um alimento contenha um total de 30 g de carboidratos por poro, isso seria igual a 2 pores de carboidratos.  Verifique a quantidade em gramas (g) de gorduras saturadas e trans em uma poro. Escolha alimentos com pouca ou nenhuma quantidade desse tipo de [place].  Verifique a quantidade de miligramas (mg) de sal (sdio) em uma poro. A maioria das pessoas deve limitar o consumo de sdio a 2.300 mg por dia.  Sempre verifique as informaes nutricionais dos alimentos rotulados como de "baixo teor de gordura" e "gordura zero". Esses alimentos podem conter elevado teor de acar de adio ou carboidratos refinados e devem ser evitados.  Converse com seu nutricionista para identificar suas metas dirias dos nutrientes listados na tabela. No mercado  Evite comprar alimentos enlatados, semiprontos ou processados. Esses alimentos tendem a conter elevado teor de gordura, sdio e acares adicionados.  Escolha itens das reas mais externas da seo de alimentos.  l que voc geralmente encontra frutas e legumes frescos, cereais a granel, carnes frescas e laticnios frescos. Na cozinha  Use mtodos de cozimento de baixo calor, como assar, em vez de mtodos de cozimento de Advertising account planner, como fritura por imerso.  Cozinhe com leos saudveis para o corao, como de [place], canola ou [place].  Evite cozinhar com manteiga, creme ou carnes com elevado teor de [place]. Planejamento das refeies  Consuma refeies e lanches regularmente, de preferncia nos mesmos horrios todos os dias. Evite ficar longos perodos sem comer.  Comer alimentos ricos em [place], como frutas e legumes frescos, feijes e gros integrais. Converse com seu nutricionista sobre quantas pores de  carboidratos voc pode comer em cada refeio.  Coma de 4-6 oz (112-168 g) de protena magra por dia, como carne magra, frango, peixe, ovos ou tofu. Uma ona (oz) de protena magra  igual a: ? 1 oz (28 g) de carne, frango ou peixe. ? 1 ovo. ?  xcara (62 g) de tofu.  Coma alguns alimentos todos os dias que contenham gorduras saudveis, como abacate, nozes, sementes e peixe.   Quais alimentos devo comer? Lambert Mody Bagas. Mas. Laranjas. Pssegos. Damascos. Ameixas. Uvas. Manga. Mamo. Rom. Kiwi. Cerejas. Legumes Alface. Espinafre. Folhas verdes, incluindo repolho, acelga, [employer]. Beterrabas. Couve-flor. Repolho. Brcolis. Cenoura. Vagem. Tomates. Pimentas. Cebolas. Pepino. Couve-de-Bruxelas. Dorma Russell integrais, como trigo integral ou pes, bolachas, cereais, tortilhas e massas integrais. Aveia sem acar. Quinoa. Arroz integral ou selvagem. Carnes e outros alimentos fontes de protena Frutos do mar. Aves sem pele. Cortes magros de aves e carne. Queijo tofu. Nozes. Sementes. Produtos lcteos Laticnios desnatados ou semidesnatados, como leite, iogurte e queijo. Os itens listados acima podem no ser [place] alimentos e bebidas que voc pode consumir. Converse com um nutricionista para obter mais informaes. Quais alimentos devo evitar? Lambert Mody Lambert Mody em calda enlatadas. Legumes Legumes enlatados. Verduras congeladas com manteiga ou molho  base de creme de leite. Anda Latina branca e produtos  base de farinha refinada, como po, massas, salgadinhos e cereais. Evite todos os alimentos processados. Carnes e  outros alimentos fontes de protena Cortes de carne com [place]. Aves com pele. Carnes empanadas ou fritas em imerso. Carne processada. Evite gorduras saturadas. Produtos lcteos Iogurte integral, queijo ou leite integral. Bebidas Bebidas aucaradas, como refrigerantes ou ch gelado industrializado. Os itens listados acima podem no ser [employer] alimentos e bebidas que voc deve evitar. Converse com um nutricionista para obter mais informaes. Perguntas a fazer ao mdico  Preciso me consultar com um especialista em diabetes?  Preciso me consultar com um nutricionista?  Para que nmero devo ligar se tiver dvidas?  Quais so os melhores horrios para verificar minha glicose sangunea? Onde conseguir mais informaes:  American Diabetes Association, ADA (Associao de Diabetes Americana): diabetes.org  Academy of Nutrition and Dietetics (Academia de Nutrio e Diettica): www.eatright.[employer] of Diabetes and Digestive and Kidney Diseases [employer] de Diabetes e [place]): [place]Fun[place].dk  Association of Diabetes Care Education Specialists, ADCES ([place] do Diabetes): www.diabeteseducator.org Resumo   importante ter hbitos de alimentao saudveis, uma vez que os seus nveis de acar no sangue (glicose) so bastante afetados pelo que voc come e bebe.  Um plano de refeies saudveis ajudar voc a controlar sua glicose sangunea e a manter um estilo de vida saudvel.  Seu mdico poder recomendar que voc consulte um nutricionista para elaborar o melhor plano de refeies para voc.  Tenha em mente que carboidratos (acares) e lcool tm efeitos imediatos sobre seus nveis de glicose sangunea.  importante contar os carboidratos e consumir lcool com cautela. Estas informaes no se destinam a substituir as recomendaes de seu mdico. No deixe de discutir quaisquer dvidas com seu mdico. Document Revised: 03/23/2019 Document Reviewed: 03/23/2019 Elsevier Patient Education  [place].

## 2020-07-09 NOTE — Assessment & Plan Note (Signed)
Lab Results  Component Value Date   CHOL 169 05/01/2020   HDL 45 05/01/2020   LDLCALC 91 05/01/2020   TRIG 196 (H) 05/01/2020   CHOLHDL 3.8 05/01/2020   Currently on atorvastatin 80mg . Follows with cardiology. - LDL of 90, above goal - C/w atorvastatin 80mg  daily

## 2020-07-09 NOTE — Assessment & Plan Note (Addendum)
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 He mentions that recently he was told his insurance would not be accepted by his previous PCP and he had to establish care with a new clinic. He mentions being on Metformin  for couple years and had previously tried higher dose but was unable to tolerate due to GI side effects. He was recently started on Januvia  and has been tolerating it well without significant side effects. He mentions some difficulty with adherence and requests questions about alternative choices. He denies polyuria, polydipsia, polyphagia.  A/P Recent hgb a1c checked with improvement but still above goal of 7. Januvia  recently started. If hgb a1c persistently above goal, may need to add on additional therapy. Will provide combination script for easier adherence. - Start metformin -sitagliptin  ER 50-500mg  daily - D/c metformin  500mg  Bid, sitagliptin  50mg  daily - Discussed low carb diet - Diabetic foot-exam performed - Microalbuminuria screen   >>ASSESSMENT AND PLAN FOR DIABETES MELLITUS (HCC) WRITTEN ON 07/09/2020 10:23 AM BY LEE, FONDA POUR, MD  Lab Results  Component Value Date   CHOL 169 05/01/2020   HDL 45 05/01/2020   LDLCALC 91 05/01/2020   TRIG 196 (H) 05/01/2020   CHOLHDL 3.8 05/01/2020   Currently on atorvastatin  80mg . Follows with cardiology. - LDL of 90, above goal - C/w atorvastatin  80mg  daily

## 2020-07-09 NOTE — Assessment & Plan Note (Deleted)
Mr.Leisey is a 55 yo M w/ PMH of T2DM, HLD, CVA presents to [facility] to establish care. He mentions that recently he was told his insurance would not be accepted by his previous PCP and he had to establish care with a new clinic. He mentions being on Metformin for couple years and had previously tried higher dose but was unable to tolerate due to GI side effects. He was recently started on Januvia and has been tolerating it well without significant side effects. He mentions some difficulty with adherence and requests questions about alternative choices. He denies polyuria, polydipsia, polyphagia.  A/P Recent hgb a1c checked with improvement but still above goal of 7. Januvia recently started. If hgb a1c persistently above goal, may need to add on additional therapy. Will provide combination script for easier adherence. - Start metformin-sitagliptin ER 50-500mg  daily - D/c metformin 500mg  Bid, sitagliptin 50mg  daily - Discussed low carb diet - Diabetic foot-exam performed - Microalbuminuria screen

## 2020-07-09 NOTE — Progress Notes (Signed)
CC: Diabetes  HPI: Mr.Nazario ROGER KETTLES is a 55 y.o. with PMH listed below presenting with complaint of diabetes. Please see problem based assessment and plan for further details.  Past Medical History:  Diagnosis Date  . Diabetes mellitus without complication ([place]) 2440  . Hyperlipidemia   . Stroke [facility]) 05/06/2018   R sided weakness   Past Surgical History Ankle surgery 9 years ago  Family Hx Father had malignancy but unclear what type  Social Hx Currently lives with a daughter. Living on disability. Denies any alcohol, tobacco, illicit substance use  Review of Systems: Review of Systems  Constitutional: Negative for chills, fever and malaise/fatigue.  Eyes: Negative for blurred vision.  Respiratory: Negative for cough and shortness of breath.   Cardiovascular: Negative for chest pain, palpitations and leg swelling.  Gastrointestinal: Negative for constipation, diarrhea, nausea and vomiting.  Musculoskeletal: Negative for back pain, joint pain and neck pain.  Neurological: Positive for focal weakness. Negative for dizziness.  All other systems reviewed and are negative.  Physical Exam: Vitals:   07/09/20 0851  BP: 119/73  Pulse: 64  Temp: 98.4 F (36.9 C)  TempSrc: Oral  SpO2: 100%  Weight: 150 lb (68 kg)  Height: 5\' 4"  (1.626 m)   Gen: Well-developed, well nourished, NAD HEENT: NCAT head, hearing intact CV: RRR, S1, S2 normal Pulm: CTAB, No rales, no wheezes Extm: ROM intact, Peripheral pulses intact, No peripheral edema Skin: Dry, Warm, normal turgor Neuro: Mental status: A&Ox3 Cranial Nerves:             II: PERRL             III, IV, VI: Extra-occular motions intact bilaterally             V, VII: Face symmetric, sensation intact in all 3 divisions               VIII: hearing normal to rubbing fingers bilaterally               IX, X: palate rises symmetrically             XI: Head turn and shoulder shrug normal bilaterally               XII: tongue  midline    Motor: Strength 3/5 on RUE, 5/5 on LUE,5/5 on Lower extremities bilaterally, bulk muscle and tone are normal  Deep Tendon Reflexes: 2+ symmetric Sensory: Light touch intact and symmetric bilaterally  Coordination: There is no dysmetria on finger-to-nose Psychiatric: Normal mood and affect  Assessment & Plan:   Uncontrolled type 2 diabetes mellitus with hyperglycemia (HCC) Mr.Sandy is a 55 yo M w/ PMH of T2DM, HLD, CVA presents to [facility] to establish care. He mentions that recently he was told his insurance would not be accepted by his previous PCP and he had to establish care with a new clinic. He mentions being on Metformin for couple years and had previously tried higher dose but was unable to tolerate due to GI side effects. He was recently started on Januvia and has been tolerating it well without significant side effects. He mentions some difficulty with adherence and requests questions about alternative choices. He denies polyuria, polydipsia, polyphagia.  A/P Recent hgb a1c checked with improvement but still above goal of 7. Januvia recently started. If hgb a1c persistently above goal, may need to add on additional therapy. Will provide combination script for easier adherence. - Start metformin-sitagliptin ER 50-500mg  daily - D/c metformin 500mg   Bid, sitagliptin 50mg  daily - Discussed low carb diet - Diabetic foot-exam performed - Microalbuminuria screen  Dyslipidemia associated with type 2 diabetes mellitus ([place]) Lab Results  Component Value Date   CHOL 169 05/01/2020   HDL 45 05/01/2020   LDLCALC 91 05/01/2020   TRIG 196 (H) 05/01/2020   CHOLHDL 3.8 05/01/2020   Currently on atorvastatin 80mg . Follows with cardiology. 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 Mentions improvement in  sxs but continues to have right sided upper extremity weakness that makes eating, dressing a slower task to complete. Has multiple family member who can help out if needed.  A/P Monitor - C/w aspirin, atorvastatin   Patient discussed with Dr.Williams  -Gilberto Better, [facility] Pager: [phone]

## 2020-07-09 NOTE — Assessment & Plan Note (Signed)
Has hx of CVA diagnosed in 2020 with left pontine infarct with residual right sided weakness. Mentions improvement in sxs but continues to have right sided upper extremity weakness that makes eating, dressing a slower task to complete. Has multiple family member who can help out if needed.  A/P Monitor - C/w aspirin, atorvastatin

## 2020-07-10 LAB — MICROALBUMIN / CREATININE URINE RATIO
Creatinine, Urine: 64.8 mg/dL
Microalb/Creat Ratio: <5 mg/g creat (ref 0–29)
Microalbumin, Urine: <3 ug/mL

## 2020-07-11 NOTE — Progress Notes (Signed)
Internal Medicine Clinic Attending  Case discussed with Dr. Lee  At the time of the visit.  We reviewed the resident's history and exam and pertinent patient test results.  I agree with the assessment, diagnosis, and plan of care documented in the resident's note.    

## 2020-07-12 ENCOUNTER — Other Ambulatory Visit (HOSPITAL_COMMUNITY): Payer: Self-pay

## 2020-07-12 ENCOUNTER — Ambulatory Visit (HOSPITAL_COMMUNITY)
Admission: EM | Admit: 2020-07-12 | Discharge: 2020-07-12 | Disposition: A | Discharge status: Home / Self Care | Payer: Medicaid Other | Attending: Urgent Care | Admitting: Urgent Care

## 2020-07-12 ENCOUNTER — Other Ambulatory Visit: Payer: Self-pay

## 2020-07-12 ENCOUNTER — Encounter (HOSPITAL_COMMUNITY): Payer: Self-pay | Admitting: Emergency Medicine

## 2020-07-12 ENCOUNTER — Ambulatory Visit (INDEPENDENT_AMBULATORY_CARE_PROVIDER_SITE_OTHER): Payer: Medicaid Other

## 2020-07-12 DIAGNOSIS — M79601 Pain in right arm: Secondary | ICD-10-CM | POA: Diagnosis not present

## 2020-07-12 DIAGNOSIS — M25531 Pain in right wrist: Secondary | ICD-10-CM

## 2020-07-12 DIAGNOSIS — S62336A Displaced fracture of neck of fifth metacarpal bone, right hand, initial encounter for closed fracture: Secondary | ICD-10-CM

## 2020-07-12 DIAGNOSIS — M79621 Pain in right upper arm: Secondary | ICD-10-CM

## 2020-07-12 DIAGNOSIS — S62346A Nondisplaced fracture of base of fifth metacarpal bone, right hand, initial encounter for closed fracture: Secondary | ICD-10-CM

## 2020-07-12 DIAGNOSIS — M25521 Pain in right elbow: Secondary | ICD-10-CM

## 2020-07-12 MED ORDER — ACETAMINOPHEN 325 MG PO TABS
ORAL_TABLET | ORAL | Status: AC
  Filled 2020-07-12: qty 3

## 2020-07-12 MED ORDER — ACETAMINOPHEN 325 MG PO TABS
975.0000 mg | ORAL_TABLET | Freq: Once | ORAL | Status: AC
  Administered 2020-07-12: 975 mg via ORAL

## 2020-07-12 MED ORDER — TRAMADOL HCL 50 MG PO TABS
50.0000 mg | ORAL_TABLET | Freq: Four times a day (QID) | ORAL | 0 refills | Status: DC | PRN
  Filled 2020-07-12: qty 15, 4d supply, fill #0
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
## 2020-07-12 NOTE — ED Notes (Signed)
Ortho tech has been called

## 2020-07-12 NOTE — Discharge Instructions (Addendum)
Por favor use Tylenol para dolor. Tome 500mg -650mg  cada 6 horas. Si todavia tiene [employer] usar un medicamento mas potente, tramadol. Puede tomar este medicamento cada 6 horas. Si no lo necessita y solo Tylenol le ayuda, no use tramadol.   Llame la clinica del Dr. Fredna Dow para un cita la semana que entra.
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
## 2020-07-12 NOTE — Progress Notes (Signed)
Orthopedic Tech Progress Note Patient Details:  Arthur Howard 1965/07/23 [id number]  Ortho Devices Type of Ortho Device: Ulna gutter splint Ortho Device/Splint Location: RUE Ortho Device/Splint Interventions: Adjustment,Application,Ordered   Post Interventions Patient Tolerated: Well Instructions Provided: Care of device   Chyna Kneece A Asianna Brundage 07/12/2020, 2:29 PM

## 2020-07-18 ENCOUNTER — Other Ambulatory Visit: Payer: Self-pay | Admitting: Internal Medicine

## 2020-07-18 DIAGNOSIS — E7849 Other hyperlipidemia: Secondary | ICD-10-CM

## 2020-07-19 ENCOUNTER — Other Ambulatory Visit: Payer: Self-pay | Admitting: Student

## 2020-07-19 ENCOUNTER — Other Ambulatory Visit (HOSPITAL_COMMUNITY): Payer: Self-pay

## 2020-07-19 DIAGNOSIS — E7849 Other hyperlipidemia: Secondary | ICD-10-CM

## 2020-07-19 MED ORDER — ATORVASTATIN CALCIUM 80 MG PO TABS
80.0000 mg | ORAL_TABLET | Freq: Every day | ORAL | 0 refills | Status: DC
Start: 2020-07-19 — End: 2020-07-24
  Filled 2020-07-19 – 2020-07-20 (×2): qty 90, 90d supply, fill #0
  Filled 2020-07-24: qty 30, 30d supply, fill #0

## 2020-07-19 NOTE — Telephone Encounter (Signed)
Next appt scheduled 5/16 with PCP. 

## 2020-07-20 ENCOUNTER — Other Ambulatory Visit: Payer: Self-pay | Admitting: Orthopedic Surgery

## 2020-07-20 ENCOUNTER — Other Ambulatory Visit (HOSPITAL_COMMUNITY): Payer: Self-pay

## 2020-07-23 ENCOUNTER — Encounter (HOSPITAL_BASED_OUTPATIENT_CLINIC_OR_DEPARTMENT_OTHER): Payer: Self-pay | Admitting: Orthopedic Surgery

## 2020-07-23 ENCOUNTER — Telehealth: Payer: Self-pay | Admitting: *Deleted

## 2020-07-23 ENCOUNTER — Other Ambulatory Visit (HOSPITAL_COMMUNITY)
Admission: RE | Admit: 2020-07-23 | Discharge: 2020-07-23 | Disposition: A | Discharge status: Home / Self Care | Payer: Medicaid Other | Source: Ambulatory Visit | Attending: Orthopedic Surgery | Admitting: Orthopedic Surgery

## 2020-07-23 ENCOUNTER — Other Ambulatory Visit (HOSPITAL_COMMUNITY): Payer: Self-pay

## 2020-07-23 ENCOUNTER — Other Ambulatory Visit: Payer: Self-pay

## 2020-07-23 ENCOUNTER — Encounter (HOSPITAL_BASED_OUTPATIENT_CLINIC_OR_DEPARTMENT_OTHER)
Admission: RE | Admit: 2020-07-23 | Discharge: 2020-07-23 | Disposition: A | Discharge status: Home / Self Care | Payer: Medicaid Other | Source: Ambulatory Visit | Attending: Orthopedic Surgery | Admitting: Orthopedic Surgery

## 2020-07-23 DIAGNOSIS — Z01812 Encounter for preprocedural laboratory examination: Secondary | ICD-10-CM | POA: Insufficient documentation

## 2020-07-23 DIAGNOSIS — Z20822 Contact with and (suspected) exposure to covid-19: Secondary | ICD-10-CM | POA: Insufficient documentation

## 2020-07-23 LAB — BASIC METABOLIC PANEL
Anion gap: 9 (ref 5–15)
BUN: 14 mg/dL (ref 6–20)
CO2: 27 mmol/L (ref 22–32)
Calcium: 9.4 mg/dL (ref 8.9–10.3)
Chloride: 98 mmol/L (ref 98–111)
Creatinine, Ser: 0.84 mg/dL (ref 0.61–1.24)
GFR, Estimated: >60 mL/min (ref >60)
Glucose, Bld: 400 mg/dL — ABNORMAL HIGH (ref 70–99)
Potassium: 4.7 mmol/L (ref 3.5–5.1)
Sodium: 134 mmol/L — ABNORMAL LOW (ref 135–145)

## 2020-07-23 NOTE — Telephone Encounter (Signed)
   Name: Arthur Howard  DOB: [birth year]  MRN: [id number]   Primary Cardiologist: Larae Grooms, MD  Chart reviewed as part of pre-operative protocol coverage. Patient was contacted 07/23/2020 in reference to pre-operative risk assessment for pending surgery as outlined below.  Arthur Howard was last seen on 05/31/2020 by Kathyrn Drown NP. According to the patient's daughter, she is not aware of any recent chest discomfort or worsening dyspnea.  Patient had a reassuring coronary CT in February 2022 that showed mild to moderate disease without critical lesion.  He may hold aspirin prior to surgery and restart as soon as possible afterward at the discretion of the surgeon.   Therefore, based on ACC/AHA guidelines, the patient would be at acceptable risk for the planned procedure without further cardiovascular testing.   The patient was advised that if he develops new symptoms prior to surgery to contact our office to arrange for a follow-up visit, and he verbalized understanding.  I will route this recommendation to the requesting party via Epic fax function and remove from pre-op pool. Please call with questions.  [place], [place] 07/23/2020, 1:36 PM

## 2020-07-23 NOTE — Telephone Encounter (Signed)
   [facility] Pre-operative Risk Assessment    Patient Name: ESTEBAN KOBASHIGAWA  DOB: Nov 21, 1965  MRN: [id number]   HEARTCARE STAFF: - Please ensure there is not already an duplicate clearance open for this procedure. - Under Visit Info/Reason for Call, type in Other and utilize the format Clearance MM/DD/YY or Clearance TBD. Do not use dashes or single digits. - If request is for dental extraction, please clarify the # of teeth to be extracted.  Request for surgical clearance:  1. What type of surgery is being performed?  CRPP VS ORIF RIGHT SMALL METACARPAL FRACTURE   2. When is this surgery scheduled?  07/26/2020   3. What type of clearance is required (medical clearance vs. Pharmacy clearance to hold med vs. Both)?  BOTH  4. Are there any medications that need to be held prior to surgery and how long? ASPIRIN   5. Practice name and name of physician performing surgery?  [facility] / DR. Lennette Bihari KUZMA   6. What is the office phone number?  [phone]   7.   What is the office fax number?  [phone]  8.   Anesthesia type (None, local, MAC, general) ?  CHOICE   Jeanann Lewandowsky 07/23/2020, 9:03 AM  _________________________________________________________________   (provider comments below)

## 2020-07-23 NOTE — Progress Notes (Signed)

## 2020-07-24 ENCOUNTER — Other Ambulatory Visit (HOSPITAL_COMMUNITY): Payer: Self-pay

## 2020-07-24 ENCOUNTER — Other Ambulatory Visit: Payer: Self-pay

## 2020-07-24 DIAGNOSIS — E7849 Other hyperlipidemia: Secondary | ICD-10-CM

## 2020-07-24 LAB — SARS CORONAVIRUS 2 (TAT 6-24 HRS): SARS Coronavirus 2: NEGATIVE

## 2020-07-24 MED ORDER — ATORVASTATIN CALCIUM 80 MG PO TABS
80.0000 mg | ORAL_TABLET | Freq: Every day | ORAL | 0 refills | Status: DC
  Filled 2020-07-24: qty 30, 30d supply, fill #0
  Filled 2020-08-27: qty 30, 30d supply, fill #1
  Filled 2020-09-27: qty 30, 30d supply, fill #2

## 2020-07-24 NOTE — Telephone Encounter (Signed)
I talked to Junie Panning at [place] [facility] - stated they tried to run rx thru Medicaid but it would not go thru. So need to re-send rx with "IM Program". Thanks

## 2020-07-24 NOTE — Telephone Encounter (Signed)
Patient's daughter is requesting to speak with pre-op to further discuss the recommendation.   Phone #: [phone]

## 2020-07-24 NOTE — Progress Notes (Signed)
Glucose-400, Dr.Finucane aware and will reevaluate day of surgery. Left message for [name] at Dr. Levell July office.

## 2020-07-24 NOTE — Telephone Encounter (Signed)
Pt daughter is requesting his atorvastatin (LIPITOR) 80 MG tablet sent to  [place] Phone:  [phone]  Fax:  [phone]

## 2020-07-26 ENCOUNTER — Ambulatory Visit (HOSPITAL_BASED_OUTPATIENT_CLINIC_OR_DEPARTMENT_OTHER): Payer: Medicaid Other | Admitting: Anesthesiology

## 2020-07-26 ENCOUNTER — Encounter (HOSPITAL_BASED_OUTPATIENT_CLINIC_OR_DEPARTMENT_OTHER)
Admission: RE | Disposition: A | Discharge status: Home / Self Care | Payer: Self-pay | Source: Home / Self Care | Attending: Orthopedic Surgery

## 2020-07-26 ENCOUNTER — Encounter (HOSPITAL_BASED_OUTPATIENT_CLINIC_OR_DEPARTMENT_OTHER): Payer: Self-pay | Admitting: Orthopedic Surgery

## 2020-07-26 ENCOUNTER — Other Ambulatory Visit (HOSPITAL_COMMUNITY): Payer: Self-pay

## 2020-07-26 ENCOUNTER — Ambulatory Visit (HOSPITAL_BASED_OUTPATIENT_CLINIC_OR_DEPARTMENT_OTHER)
Admission: RE | Admit: 2020-07-26 | Discharge: 2020-07-26 | Disposition: A | Discharge status: Home / Self Care | Payer: Medicaid Other | Attending: Orthopedic Surgery | Admitting: Orthopedic Surgery

## 2020-07-26 ENCOUNTER — Other Ambulatory Visit: Payer: Self-pay

## 2020-07-26 DIAGNOSIS — E119 Type 2 diabetes mellitus without complications: Secondary | ICD-10-CM | POA: Diagnosis not present

## 2020-07-26 DIAGNOSIS — Z7982 Long term (current) use of aspirin: Secondary | ICD-10-CM | POA: Insufficient documentation

## 2020-07-26 DIAGNOSIS — Z833 Family history of diabetes mellitus: Secondary | ICD-10-CM | POA: Insufficient documentation

## 2020-07-26 DIAGNOSIS — X58XXXA Exposure to other specified factors, initial encounter: Secondary | ICD-10-CM | POA: Insufficient documentation

## 2020-07-26 DIAGNOSIS — E785 Hyperlipidemia, unspecified: Secondary | ICD-10-CM | POA: Diagnosis not present

## 2020-07-26 DIAGNOSIS — S62326A Displaced fracture of shaft of fifth metacarpal bone, right hand, initial encounter for closed fracture: Secondary | ICD-10-CM | POA: Diagnosis not present

## 2020-07-26 DIAGNOSIS — Z7984 Long term (current) use of oral hypoglycemic drugs: Secondary | ICD-10-CM | POA: Diagnosis not present

## 2020-07-26 DIAGNOSIS — I69351 Hemiplegia and hemiparesis following cerebral infarction affecting right dominant side: Secondary | ICD-10-CM | POA: Insufficient documentation

## 2020-07-26 DIAGNOSIS — Z87891 Personal history of nicotine dependence: Secondary | ICD-10-CM | POA: Insufficient documentation

## 2020-07-26 DIAGNOSIS — Z79899 Other long term (current) drug therapy: Secondary | ICD-10-CM | POA: Diagnosis not present

## 2020-07-26 DIAGNOSIS — Z8249 Family history of ischemic heart disease and other diseases of the circulatory system: Secondary | ICD-10-CM | POA: Insufficient documentation

## 2020-07-26 HISTORY — PX: CLOSED REDUCTION METACARPAL WITH PERCUTANEOUS PINNING: SHX5613

## 2020-07-26 LAB — GLUCOSE, CAPILLARY
Glucose-Capillary: 216 mg/dL — ABNORMAL HIGH (ref 70–99)
Glucose-Capillary: 217 mg/dL — ABNORMAL HIGH (ref 70–99)

## 2020-07-26 SURGERY — CLOSED REDUCTION, FRACTURE, METACARPAL BONE, WITH PERCUTANEOUS PINNING
Anesthesia: General | Site: Finger | Laterality: Right

## 2020-07-26 MED ORDER — MIDAZOLAM HCL 2 MG/2ML IJ SOLN
INTRAMUSCULAR | Status: AC
  Filled 2020-07-26: qty 2

## 2020-07-26 MED ORDER — FENTANYL CITRATE (PF) 100 MCG/2ML IJ SOLN
INTRAMUSCULAR | Status: DC | PRN
  Administered 2020-07-26: 50 ug via INTRAVENOUS
  Administered 2020-07-26: 100 ug via INTRAVENOUS

## 2020-07-26 MED ORDER — DROPERIDOL 2.5 MG/ML IJ SOLN
INTRAMUSCULAR | Status: DC | PRN
  Administered 2020-07-26: .625 mg via INTRAVENOUS

## 2020-07-26 MED ORDER — OXYCODONE HCL 5 MG/5ML PO SOLN: 5.0000 mg | Freq: Once | ORAL | Status: DC | PRN

## 2020-07-26 MED ORDER — FENTANYL CITRATE (PF) 100 MCG/2ML IJ SOLN
INTRAMUSCULAR | Status: AC
  Filled 2020-07-26: qty 2

## 2020-07-26 MED ORDER — SUCCINYLCHOLINE CHLORIDE 200 MG/10ML IV SOSY
PREFILLED_SYRINGE | INTRAVENOUS | Status: AC
  Filled 2020-07-26: qty 10

## 2020-07-26 MED ORDER — OXYCODONE HCL 5 MG PO TABS: 5.0000 mg | ORAL_TABLET | Freq: Once | ORAL | Status: DC | PRN

## 2020-07-26 MED ORDER — OXYCODONE-ACETAMINOPHEN 5-325 MG PO TABS
1.0000 | ORAL_TABLET | Freq: Four times a day (QID) | ORAL | 0 refills | Status: DC | PRN
  Filled 2020-07-26: qty 25, 3d supply, fill #0

## 2020-07-26 MED ORDER — EPHEDRINE 5 MG/ML INJ
INTRAVENOUS | Status: AC
  Filled 2020-07-26: qty 10

## 2020-07-26 MED ORDER — PHENYLEPHRINE 40 MCG/ML (10ML) SYRINGE FOR IV PUSH (FOR BLOOD PRESSURE SUPPORT)
PREFILLED_SYRINGE | INTRAVENOUS | Status: AC
  Filled 2020-07-26: qty 10

## 2020-07-26 MED ORDER — DEXAMETHASONE SODIUM PHOSPHATE 10 MG/ML IJ SOLN
INTRAMUSCULAR | Status: AC
  Filled 2020-07-26: qty 1

## 2020-07-26 MED ORDER — PROMETHAZINE HCL 25 MG/ML IJ SOLN
6.2500 mg | INTRAMUSCULAR | Status: DC | PRN
Start: 2020-07-26 — End: 2020-07-26

## 2020-07-26 MED ORDER — ONDANSETRON HCL 4 MG/2ML IJ SOLN
INTRAMUSCULAR | Status: AC
  Filled 2020-07-26: qty 2

## 2020-07-26 MED ORDER — CEFAZOLIN SODIUM-DEXTROSE 2-4 GM/100ML-% IV SOLN
2.0000 g | INTRAVENOUS | Status: AC
  Administered 2020-07-26: 2 g via INTRAVENOUS

## 2020-07-26 MED ORDER — MEPERIDINE HCL 25 MG/ML IJ SOLN: 6.2500 mg | INTRAMUSCULAR | Status: DC | PRN

## 2020-07-26 MED ORDER — HYDROMORPHONE HCL 1 MG/ML IJ SOLN
0.2500 mg | INTRAMUSCULAR | Status: DC | PRN
Start: 2020-07-26 — End: 2020-07-26

## 2020-07-26 MED ORDER — PROPOFOL 10 MG/ML IV BOLUS
INTRAVENOUS | Status: DC | PRN
  Administered 2020-07-26: 200 mg via INTRAVENOUS

## 2020-07-26 MED ORDER — CEFAZOLIN SODIUM-DEXTROSE 2-4 GM/100ML-% IV SOLN
INTRAVENOUS | Status: AC
  Filled 2020-07-26: qty 100

## 2020-07-26 MED ORDER — BUPIVACAINE HCL (PF) 0.25 % IJ SOLN
INTRAMUSCULAR | Status: DC | PRN
  Administered 2020-07-26: 9 mL

## 2020-07-26 MED ORDER — LIDOCAINE 2% (20 MG/ML) 5 ML SYRINGE
INTRAMUSCULAR | Status: DC | PRN
  Administered 2020-07-26: 60 mg via INTRAVENOUS

## 2020-07-26 MED ORDER — DEXAMETHASONE SODIUM PHOSPHATE 10 MG/ML IJ SOLN
INTRAMUSCULAR | Status: DC | PRN
  Administered 2020-07-26: 10 mg via INTRAVENOUS

## 2020-07-26 MED ORDER — AMISULPRIDE (ANTIEMETIC) 5 MG/2ML IV SOLN
10.0000 mg | Freq: Once | INTRAVENOUS | Status: DC | PRN
Start: 2020-07-26 — End: 2020-07-26

## 2020-07-26 MED ORDER — LACTATED RINGERS IV SOLN: INTRAVENOUS | Status: DC

## 2020-07-26 MED ORDER — LIDOCAINE 2% (20 MG/ML) 5 ML SYRINGE
INTRAMUSCULAR | Status: AC
  Filled 2020-07-26: qty 5

## 2020-07-26 MED ORDER — MIDAZOLAM HCL 5 MG/5ML IJ SOLN
INTRAMUSCULAR | Status: DC | PRN
  Administered 2020-07-26: 2 mg via INTRAVENOUS

## 2020-07-26 SURGICAL SUPPLY — 42 items
APL PRP STRL LF DISP 70% ISPRP (MISCELLANEOUS) ×1
BLADE SURG 15 STRL LF DISP TIS (BLADE) ×1 IMPLANT
BLADE SURG 15 STRL SS (BLADE) ×2
BNDG CMPR 9X4 STRL LF SNTH (GAUZE/BANDAGES/DRESSINGS)
BNDG ELASTIC 2X5.8 VLCR STR LF (GAUZE/BANDAGES/DRESSINGS) IMPLANT
BNDG ELASTIC 3X5.8 VLCR STR LF (GAUZE/BANDAGES/DRESSINGS) ×2 IMPLANT
BNDG ESMARK 4X9 LF (GAUZE/BANDAGES/DRESSINGS) IMPLANT
BNDG GAUZE ELAST 4 BULKY (GAUZE/BANDAGES/DRESSINGS) ×2 IMPLANT
CHLORAPREP W/TINT 26 (MISCELLANEOUS) ×2 IMPLANT
CORD BIPOLAR FORCEPS 12FT (ELECTRODE) IMPLANT
COVER BACK TABLE 60X90IN (DRAPES) ×2 IMPLANT
COVER MAYO STAND STRL (DRAPES) ×2 IMPLANT
COVER WAND RF STERILE (DRAPES) IMPLANT
CUFF TOURN SGL QUICK 18X4 (TOURNIQUET CUFF) ×2 IMPLANT
DRAPE EXTREMITY T 121X128X90 (DISPOSABLE) ×2 IMPLANT
DRAPE OEC MINIVIEW 54X84 (DRAPES) ×2 IMPLANT
DRAPE SURG 17X23 STRL (DRAPES) ×2 IMPLANT
GAUZE SPONGE 4X4 12PLY STRL (GAUZE/BANDAGES/DRESSINGS) ×2 IMPLANT
GAUZE XEROFORM 1X8 LF (GAUZE/BANDAGES/DRESSINGS) ×2 IMPLANT
GLOVE SRG 8 PF TXTR STRL LF DI (GLOVE) ×1 IMPLANT
GLOVE SURG ENC MOIS LTX SZ7.5 (GLOVE) ×2 IMPLANT
GLOVE SURG ORTHO LTX SZ8 (GLOVE) IMPLANT
GLOVE SURG UNDER POLY LF SZ8 (GLOVE) ×2
GLOVE SURG UNDER POLY LF SZ8.5 (GLOVE) IMPLANT
GOWN STRL REUS W/ TWL LRG LVL3 (GOWN DISPOSABLE) ×1 IMPLANT
GOWN STRL REUS W/TWL LRG LVL3 (GOWN DISPOSABLE) ×2
NEEDLE HYPO 25X1 1.5 SAFETY (NEEDLE) ×2 IMPLANT
NS IRRIG 1000ML POUR BTL (IV SOLUTION) IMPLANT
PACK BASIN DAY SURGERY FS (CUSTOM PROCEDURE TRAY) ×2 IMPLANT
PAD CAST 3X4 CTTN HI CHSV (CAST SUPPLIES) ×1 IMPLANT
PAD CAST 4YDX4 CTTN HI CHSV (CAST SUPPLIES) ×1 IMPLANT
PADDING CAST COTTON 3X4 STRL (CAST SUPPLIES) ×2
PADDING CAST COTTON 4X4 STRL (CAST SUPPLIES) ×2
SLEEVE SCD COMPRESS KNEE MED (STOCKING) IMPLANT
SLING ARM FOAM STRAP MED (SOFTGOODS) ×2 IMPLANT
STOCKINETTE 4X48 STRL (DRAPES) ×2 IMPLANT
SUT ETHILON 3 0 PS 1 (SUTURE) IMPLANT
SUT ETHILON 4 0 PS 2 18 (SUTURE) IMPLANT
SYR BULB EAR ULCER 3OZ GRN STR (SYRINGE) IMPLANT
SYR CONTROL 10ML LL (SYRINGE) ×2 IMPLANT
TOWEL GREEN STERILE FF (TOWEL DISPOSABLE) ×4 IMPLANT
UNDERPAD 30X36 HEAVY ABSORB (UNDERPADS AND DIAPERS) ×2 IMPLANT

## 2020-07-26 NOTE — Op Note (Signed)
I assisted Surgeon(s) and Role:    * Leanora Cover, MD - Primary    Daryll Brod, MD - Assisting on the Procedure(s): [place] on 07/26/2020.  I provided assistance on this case as follows: set, reduction, stabilization, fixation, application of the dressing and splints.  Electronically signed by: Daryll Brod, MD Date: 07/26/2020 Time: 10:39 AM

## 2020-07-26 NOTE — Anesthesia Procedure Notes (Signed)
Procedure Name: LMA Insertion Date/Time: 07/26/2020 9:56 AM Performed by: Willa Frater, CRNA Pre-anesthesia Checklist: Patient identified, Emergency Drugs available, Suction available and Patient being monitored Patient Re-evaluated:Patient Re-evaluated prior to induction Oxygen Delivery Method: Circle system utilized Preoxygenation: Pre-oxygenation with 100% oxygen Induction Type: IV induction Ventilation: Mask ventilation without difficulty LMA: LMA inserted LMA Size: 5.0 Number of attempts: 1 Airway Equipment and Method: Bite block Placement Confirmation: positive ETCO2 Tube secured with: Tape Dental Injury: Teeth and Oropharynx as per pre-operative assessment
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
## 2020-07-26 NOTE — Telephone Encounter (Signed)
Called daughter to answer any questions. Patient actually over at the hospital for surgery now. No questions at this time. Will remove from pre-op pool.  Darreld Mclean, PA-C 07/26/2020 9:58 AM
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
## 2020-07-26 NOTE — Anesthesia Postprocedure Evaluation (Signed)
Anesthesia Post Note  Patient: Arthur Howard  Procedure(s) Performed: CLOSED REDUCTION METACARPAL WITH PERCUTANEOUS PINNING (Right Finger)     Patient location during evaluation: PACU Anesthesia Type: General Level of consciousness: awake and alert Pain management: pain level controlled Vital Signs Assessment: post-procedure vital signs reviewed and stable Respiratory status: spontaneous breathing, nonlabored ventilation and respiratory function stable Cardiovascular status: blood pressure returned to baseline and stable Postop Assessment: no apparent nausea or vomiting Anesthetic complications: no   No complications documented.  Last Vitals:  Vitals:   07/26/20 1115 07/26/20 1130  BP: 132/68 (!) 143/66  Pulse: 64 63  Resp: 14 16  Temp:  (!) 36.4 C  SpO2: 99% 99%    Last Pain:  Vitals:   07/26/20 1130  TempSrc:   PainSc: 0-No pain                 Lynda Rainwater

## 2020-07-26 NOTE — Transfer of Care (Signed)
Immediate Anesthesia Transfer of Care Note  Patient: Arthur Howard  Procedure(s) Performed: CLOSED REDUCTION METACARPAL WITH PERCUTANEOUS PINNING (Right Finger)  Patient Location: PACU  Anesthesia Type:General  Level of Consciousness: sedated  Airway & Oxygen Therapy: Patient Spontanous Breathing and Patient connected to face mask oxygen  Post-op Assessment: Report given to RN and Post -op Vital signs reviewed and stable  Post vital signs: Reviewed and stable  Last Vitals:  Vitals Value Taken Time  BP    Temp    Pulse 62 07/26/20 1038  Resp 13 07/26/20 1038  SpO2 100 % 07/26/20 1038  Vitals shown include unvalidated device data.  Last Pain:  Vitals:   07/26/20 0854  TempSrc: Oral  PainSc: 2       Patients Stated Pain Goal: 5 (25/36/64 4034)  Complications: No complications documented.

## 2020-07-26 NOTE — Anesthesia Preprocedure Evaluation (Signed)
Anesthesia Evaluation  Patient identified by MRN, date of birth, ID band Patient awake    Reviewed: Allergy & Precautions, NPO status , Patient's Chart, lab work & pertinent test results  Airway Mallampati: II  TM Distance: >3 FB Neck ROM: Full    Dental no notable dental hx.    Pulmonary neg pulmonary ROS, former smoker,    Pulmonary exam normal breath sounds clear to auscultation       Cardiovascular negative cardio ROS Normal cardiovascular exam Rhythm:Regular Rate:Normal     Neuro/Psych CVA negative psych ROS   GI/Hepatic negative GI ROS, Neg liver ROS,   Endo/Other  negative endocrine ROSdiabetes, Type 2  Renal/GU negative Renal ROS  negative genitourinary   Musculoskeletal negative musculoskeletal ROS (+)   Abdominal   Peds negative pediatric ROS (+)  Hematology negative hematology ROS (+)   Anesthesia Other Findings   Reproductive/Obstetrics negative OB ROS                             Anesthesia Physical Anesthesia Plan  ASA: III  Anesthesia Plan: General   Post-op Pain Management:    Induction: Intravenous  PONV Risk Score and Plan: 2 and Ondansetron, Midazolam and Treatment may vary due to age or medical condition  Airway Management Planned: LMA  Additional Equipment:   Intra-op Plan:   Post-operative Plan: Extubation in OR  Informed Consent: I have reviewed the patients History and Physical, chart, labs and discussed the procedure including the risks, benefits and alternatives for the proposed anesthesia with the patient or authorized representative who has indicated his/her understanding and acceptance.     Dental advisory given  Plan Discussed with: CRNA  Anesthesia Plan Comments:         Anesthesia Quick Evaluation
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
## 2020-07-27 ENCOUNTER — Encounter (HOSPITAL_BASED_OUTPATIENT_CLINIC_OR_DEPARTMENT_OTHER): Payer: Self-pay | Admitting: Orthopedic Surgery

## 2020-07-31 ENCOUNTER — Ambulatory Visit: Payer: Medicaid Other | Admitting: Emergency Medicine

## 2020-07-31 ENCOUNTER — Ambulatory Visit: Payer: Self-pay | Admitting: Emergency Medicine

## 2020-08-01 ENCOUNTER — Other Ambulatory Visit (HOSPITAL_COMMUNITY): Payer: Self-pay

## 2020-08-06 ENCOUNTER — Ambulatory Visit: Payer: Medicaid Other | Admitting: Student

## 2020-08-06 ENCOUNTER — Other Ambulatory Visit (HOSPITAL_COMMUNITY): Payer: Self-pay

## 2020-08-06 ENCOUNTER — Encounter: Payer: Self-pay | Admitting: Student

## 2020-08-06 ENCOUNTER — Other Ambulatory Visit: Payer: Self-pay

## 2020-08-06 VITALS — BP 121/78 | HR 63 | Temp 99.2°F | Ht 64.0 in | Wt 147.9 lb

## 2020-08-06 DIAGNOSIS — R300 Dysuria: Secondary | ICD-10-CM

## 2020-08-06 DIAGNOSIS — E1165 Type 2 diabetes mellitus with hyperglycemia: Secondary | ICD-10-CM

## 2020-08-06 DIAGNOSIS — E785 Hyperlipidemia, unspecified: Secondary | ICD-10-CM

## 2020-08-06 DIAGNOSIS — E1169 Type 2 diabetes mellitus with other specified complication: Secondary | ICD-10-CM | POA: Diagnosis not present

## 2020-08-06 DIAGNOSIS — L293 Anogenital pruritus, unspecified: Secondary | ICD-10-CM | POA: Diagnosis not present

## 2020-08-06 DIAGNOSIS — I69351 Hemiplegia and hemiparesis following cerebral infarction affecting right dominant side: Secondary | ICD-10-CM | POA: Diagnosis not present

## 2020-08-06 HISTORY — DX: Dysuria: R30.0

## 2020-08-06 MED ORDER — SITAGLIP PHOS-METFORMIN HCL ER 100-1000 MG PO TB24
1.0000 | ORAL_TABLET | Freq: Every day | ORAL | 2 refills | Status: DC
  Filled 2020-08-06: qty 30, 30d supply, fill #0
  Filled 2020-08-30: qty 30, 30d supply, fill #1
  Filled 2020-09-27: qty 30, 30d supply, fill #2

## 2020-08-06 NOTE — Assessment & Plan Note (Signed)
Patient reports having burning with urination for the past week along with itching in genital region. He denies any discharge and has not experienced this before. He is not sexually active and has not been for the past 2 years. States that his burning and itching have improved after he started to take 2 tablets of his Cel[place], but wants to obtain a urinalysis to make sure there is no UTI. In the setting of uncontrolled T2DM and new onset burning with urination, will obtain urinalysis with reflex. Given he has not been sexually active for the past 2 years, low concern for STI at this time but can consider this should his symptoms not improve and urinalysis is negative.  Plan: -Urinalysis with reflex

## 2020-08-06 NOTE — Assessment & Plan Note (Signed)
History of left pontine CVA and HLD. Continue taking lipitor 80mg  daily for HLD.

## 2020-08-06 NOTE — Progress Notes (Signed)
Internal Medicine Clinic Attending  Case discussed with Dr. Jinwala  At the time of the visit.  We reviewed the resident's history and exam and pertinent patient test results.  I agree with the assessment, diagnosis, and plan of care documented in the resident's note.  

## 2020-08-06 NOTE — Assessment & Plan Note (Signed)
Patient with hx of CVA in 2020 with L pontine infarct and residual right sided weakness. His right sided weakness appears to be primarily in right upper extremity, but is gradually improving. Muscle strength of RUE is improved from last visit, but unable to test strength in hand as patient has on a hard cast following closed reduction of right 5th metacarpal fracture.  Plan: -continue testing muscle strength during visits -continue ASA and lipitor

## 2020-08-06 NOTE — Assessment & Plan Note (Addendum)
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 He is currently on Januvia  50-500mg  daily. States that his fasting blood glucose this AM was about 180, but typically remains <150. States that in the past, he was taking metforming 1000mg  BID. He has been taking 2 tablets of his januvia  for the past few days and reports that he has been feeling better and that his glucose levels have been more improved with it and is requesting an increase in his januvia  dosing. Given uncontrolled T2DM and fasting blood glucose levels above goal, it is appropriate to increase dose of januvia  and recheck HbA1c in 2 months.   Plan: -increased januvia  to 100-1000mg  daily -recheck HbA1c in 2 months   >>ASSESSMENT AND PLAN FOR DIABETES MELLITUS (HCC) WRITTEN ON 08/06/2020 10:41 AM BY EMMY ALF, MD  History of left pontine CVA and HLD. Continue taking lipitor  80mg  daily for HLD.

## 2020-08-06 NOTE — Progress Notes (Signed)
   CC: f/u of T2DM  HPI:  [name] is a 55 y.o. male with history listed below presenting to the [facility] for f/u of T2DM. Please see individualized problem based charting for full HPI.  Past Medical History:  Diagnosis Date  . Diabetes mellitus without complication ([place]) 1062  . Hyperlipidemia   . Stroke [facility]) 05/06/2018   R sided weakness    Review of Systems:  Negative aside from that listed in individualized problem based charting.  Physical Exam:  Vitals:   08/06/20 0945  BP: 121/78  Pulse: 63  Temp: 99.2 F (37.3 C)  TempSrc: Oral  SpO2: 100%  Weight: 147 lb 14.4 oz (67.1 kg)  Height: 5\' 4"  (1.626 m)   Physical Exam Constitutional:      Appearance: Normal appearance.  HENT:     Head: Normocephalic and atraumatic.     Mouth/Throat:     Mouth: Mucous membranes are moist.     Pharynx: Oropharynx is clear. No oropharyngeal exudate.  Eyes:     Extraocular Movements: Extraocular movements intact.     Conjunctiva/sclera: Conjunctivae normal.     Pupils: Pupils are equal, round, and reactive to light.  Cardiovascular:     Rate and Rhythm: Normal rate and regular rhythm.     Pulses: Normal pulses.     Heart sounds: Normal heart sounds. No murmur heard. No friction rub. No gallop.   Pulmonary:     Effort: Pulmonary effort is normal.     Breath sounds: Normal breath sounds. No wheezing, rhonchi or rales.  Abdominal:     General: Bowel sounds are normal. There is no distension.     Palpations: Abdomen is soft.     Tenderness: There is no abdominal tenderness.  Musculoskeletal:        General: No swelling. Normal range of motion.     Cervical back: Normal range of motion.     Comments: Hard cast on right hand  Skin:    General: Skin is warm and dry.  Neurological:     Mental Status: He is alert and oriented to person, place, and time.     Comments: Strength 4/5 RUE, 5/5 LUE, 5/5 RLE, 5/5 LLE.  Psychiatric:        Mood and Affect: Mood normal.         Behavior: Behavior normal.      Assessment & Plan:   See Encounters Tab for problem based charting.  Patient discussed with Dr. Philipp Ovens

## 2020-08-06 NOTE — Patient Instructions (Signed)
Arthur Howard,  It was a pleasure seeing you in the clinic today.   1. We have increased your dose of the Januvia. Please only take one tablet daily.  2. We are getting a urinalysis today. I will call you if any results are abnormal.  3. Please come back in 2 months to recheck your 50-month sugar levels.  Please call our clinic at [phone] if you have any questions or concerns. The best time to call is Monday-Friday from 9am-4pm, but there is someone available 24/7 at the same number. If you need medication refills, please notify your pharmacy one week in advance and they will send [place] a request.   Thank you for letting us take part in your care. We look forward to seeing you next time!

## 2020-08-07 LAB — URINALYSIS, ROUTINE W REFLEX MICROSCOPIC
Bilirubin, UA: NEGATIVE
Glucose, UA: NEGATIVE
Ketones, UA: NEGATIVE
Leukocytes,UA: NEGATIVE
Nitrite, UA: NEGATIVE
Protein,UA: NEGATIVE
RBC, UA: NEGATIVE
Specific Gravity, UA: 1.021 (ref 1.005–1.030)
Urobilinogen, Ur: 0.2 mg/dL (ref 0.2–1.0)
pH, UA: 5 (ref 5.0–7.5)

## 2020-08-17 ENCOUNTER — Other Ambulatory Visit: Payer: Self-pay

## 2020-08-17 ENCOUNTER — Ambulatory Visit (HOSPITAL_COMMUNITY)
Admission: EM | Admit: 2020-08-17 | Discharge: 2020-08-17 | Disposition: A | Discharge status: Home / Self Care | Payer: Medicaid Other | Attending: Family Medicine | Admitting: Family Medicine

## 2020-08-17 ENCOUNTER — Encounter (HOSPITAL_COMMUNITY): Payer: Self-pay

## 2020-08-17 DIAGNOSIS — N4889 Other specified disorders of penis: Secondary | ICD-10-CM

## 2020-08-17 MED ORDER — CLOTRIMAZOLE 1 % EX CREA: TOPICAL_CREAM | CUTANEOUS | 0 refills | Status: DC

## 2020-08-17 NOTE — ED Provider Notes (Signed)
[place]    CSN: [id number] Arrival date & time: 08/17/20  1910      History   Chief Complaint Chief Complaint  Patient presents with  . Penile Irritation    HPI Arthur Howard is a 55 y.o. male.   Medical interpreter utilized today to facilitate visit with patient's consent.  He complains of a rash to the head of his penis for the past month that has been slightly irritated if pressure applied.  States it has been getting worse the last few days but still is not severe.  Denies penile discharge, dysuria, hematuria, pelvic pain, abdominal pain, nausea vomiting diarrhea, concern for STDs.  Has tried some over-the-counter itch cream with no relief.  Was checked several days ago for a urinary tract infection through his PCP which came back negative.     Past Medical History:  Diagnosis Date  . Diabetes mellitus without complication ([place]) 7048  . Hyperlipidemia   . Stroke [facility]) 05/06/2018   R sided weakness    Patient Active Problem List   Diagnosis Date Noted  . Burning with urination 08/06/2020  . 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 ([place]) 08/02/2018  . Left pontine cerebrovascular accident ([place]) 05/11/2018  . Uncontrolled type 2 diabetes mellitus with hyperglycemia ([place])   . Dyslipidemia associated with type 2 diabetes mellitus ([place]) 08/12/2014    Past Surgical History:  Procedure Laterality Date  . ANKLE SURGERY    . CLOSED REDUCTION METACARPAL WITH PERCUTANEOUS PINNING Right 07/26/2020   Procedure: CLOSED REDUCTION METACARPAL WITH PERCUTANEOUS PINNING;  Surgeon: Leanora Cover, MD;  Location: [place];  Service: Orthopedics;  Laterality: Right;       Home Medications    Prior to Admission medications   Medication Sig Start Date End Date Taking? Authorizing Provider  clotrimazole (LOTRIMIN) 1 % cream Apply to affected area 2 times daily 08/17/20  Yes Volney American, PA-C  aspirin EC 81 MG EC  tablet Take 1 tablet (81 mg total) by mouth daily. 05/21/18   Angiulli, Lavon Paganini, PA-C  atorvastatin (LIPITOR) 80 MG tablet Take 1 tablet (80 mg total) by mouth daily. 07/24/20   Asencion Noble, MD  icosapent Ethyl (VASCEPA) 1 g capsule TAKE 2 CAPSULES (2 G TOTAL) BY MOUTH 2 (TWO) TIMES DAILY. 06/11/20 06/11/21  Belva Crome, MD  oxyCODONE-acetaminophen (PERCOCET) 5-325 MG tablet Take 1-2 tablets by mouth every 6 (six) hours as needed for pain 07/26/20   Leanora Cover, MD  SitaGLIPtin-MetFORMIN HCl [phone] MG TB24 Take 1 tablet by mouth daily. 08/06/20   Velna Ochs, MD  omega-3 acid ethyl esters (LOVAZA) 1 g capsule Take 1 capsule (1 g total) by mouth daily. 06/05/20 06/11/20  Jettie Booze, MD    Family History Family History  Problem Relation Age of Onset  . Heart disease Mother   . Lung cancer Father   . Diabetes Sister   . Diabetes Brother   . CVA Neg Hx   . Colon cancer Neg Hx   . Colon polyps Neg Hx   . Esophageal cancer Neg Hx   . Rectal cancer Neg Hx   . Stomach cancer Neg Hx     Social History Social History   Tobacco Use  . Smoking status: Former Smoker    Types: Cigarettes  . Smokeless tobacco: Never Used  . Tobacco comment: never a daily smoker  Vaping Use  . Vaping Use: Never used  Substance Use  Topics  . Alcohol use: No    Alcohol/week: 0.0 standard drinks  . Drug use: No     Allergies   Patient has no known allergies.   Review of Systems Review of Systems Per HPI Physical Exam Triage Vital Signs ED Triage Vitals  Enc Vitals Group     BP 08/17/20 1941 (!) 149/64     Pulse Rate 08/17/20 1941 85     Resp 08/17/20 1941 18     Temp --      Temp Source 08/17/20 1941 Oral     SpO2 08/17/20 1941 100 %     Weight --      Height --      Head Circumference --      Peak Flow --      Pain Score 08/17/20 1940 0     Pain Loc --      Pain Edu? --      Excl. in [place]? --    No data found.  Updated Vital Signs BP (!) 149/64 (BP Location: Right  Arm)   Pulse 85   Resp 18   SpO2 100%   Visual Acuity Right Eye Distance:   Left Eye Distance:   Bilateral Distance:    Right Eye Near:   Left Eye Near:    Bilateral Near:     Physical Exam Vitals and nursing note reviewed. Exam conducted with a chaperone present.  Constitutional:      Appearance: Normal appearance.  HENT:     Head: Atraumatic.  Eyes:     Extraocular Movements: Extraocular movements intact.     Conjunctiva/sclera: Conjunctivae normal.  Cardiovascular:     Rate and Rhythm: Normal rate and regular rhythm.  Pulmonary:     Effort: Pulmonary effort is normal.     Breath sounds: Normal breath sounds.  Genitourinary:    Comments: Mild erythema, tenderness to palpation superior head of penis Musculoskeletal:        General: Normal range of motion.     Cervical back: Normal range of motion and neck supple.  Skin:    General: Skin is warm and dry.  Neurological:     General: No focal deficit present.     Mental Status: He is oriented to person, place, and time.  Psychiatric:        Mood and Affect: Mood normal.        Thought Content: Thought content normal.        Judgment: Judgment normal.    UC Treatments / Results  Labs (all labs ordered are listed, but only abnormal results are displayed) Labs Reviewed - No data to display  EKG   Radiology No results found.  Procedures Procedures (including critical care time)  Medications Ordered in UC Medications - No data to display  Initial Impression / Assessment and Plan / UC Course  I have reviewed the triage vital signs and the nursing notes.  Pertinent labs & imaging results that were available during my care of the patient were reviewed by me and considered in my medical decision making (see chart for details).     Possibly fungal, will treat with clotrimazole cream as hydrocortisone cream has not been beneficial.  Follow-up with PCP for recheck if not resolving.  Discussed avoiding any scented  soaps or other irritating products.  Declines STI testing today.  Final Clinical Impressions(s) / UC Diagnoses   Final diagnoses:  Penile irritation   Discharge Instructions   None  ED Prescriptions    Medication Sig Dispense Auth. Provider   clotrimazole (LOTRIMIN) 1 % cream Apply to affected area 2 times daily 15 g Volney American, [place]     PDMP not reviewed this encounter.   Volney American, [place] 08/17/20 2038

## 2020-08-17 NOTE — ED Triage Notes (Signed)
Pt c/o penile irritation x 1 month. Pt states the irritation got worse 1 week ago.

## 2020-08-20 ENCOUNTER — Other Ambulatory Visit: Payer: Self-pay

## 2020-08-20 ENCOUNTER — Ambulatory Visit (HOSPITAL_COMMUNITY)
Admission: EM | Admit: 2020-08-20 | Discharge: 2020-08-20 | Disposition: A | Discharge status: Home / Self Care | Payer: Medicaid Other

## 2020-08-20 ENCOUNTER — Encounter (HOSPITAL_COMMUNITY): Payer: Self-pay

## 2020-08-20 DIAGNOSIS — N481 Balanitis: Secondary | ICD-10-CM | POA: Diagnosis not present

## 2020-08-20 NOTE — ED Triage Notes (Signed)
Pt in with c/o penile rash that has been going on for over 1 month  Pt states he was given ointment when he was seen a few days ago with no relief

## 2020-08-20 NOTE — Discharge Instructions (Signed)
Keep using clotrimazole twice daily for the next 4 weeks. Balanitis takes a long time to improve. If symptoms or not improving in 1 month, please return for reevaluation.

## 2020-08-20 NOTE — ED Notes (Signed)
This RN was chaperone with Provider during penile exam.

## 2020-08-20 NOTE — ED Provider Notes (Signed)
[place]  ____________________________________________  Time seen: Approximately 12:33 PM  I have reviewed the triage vital signs and the nursing notes.   HISTORY  Chief Complaint Rash   Historian Patient     HPI Arthur Howard is a 55 y.o. male with a history of diabetes, presents to the urgent care with penile irritation and white discharge between the foreskin of the penis.  Patient has been recently diagnosed with balanitis and patient is concerned as he has been using clotrimazole for the past 4 days without significant improvement.  Denies concerns for STDs, dysuria, hematuria or increased urinary frequency.   Past Medical History:  Diagnosis Date  . Diabetes mellitus without complication ([place]) 0175  . Hyperlipidemia   . Stroke ([place]) 05/06/2018   R sided weakness     Immunizations up to date:  Yes.     Past Medical History:  Diagnosis Date  . Diabetes mellitus without complication ([place]) 1025  . Hyperlipidemia   . Stroke [facility]) 05/06/2018   R sided weakness    Patient Active Problem List   Diagnosis Date Noted  . Burning with urination 08/06/2020  . 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 ([place]) 08/02/2018  . Left pontine cerebrovascular accident ([place]) 05/11/2018  . Uncontrolled type 2 diabetes mellitus with hyperglycemia ([place])   . Dyslipidemia associated with type 2 diabetes mellitus ([place]) 08/12/2014    Past Surgical History:  Procedure Laterality Date  . ANKLE SURGERY    . CLOSED REDUCTION METACARPAL WITH PERCUTANEOUS PINNING Right 07/26/2020   Procedure: CLOSED REDUCTION METACARPAL WITH PERCUTANEOUS PINNING;  Surgeon: Leanora Cover, MD;  Location: [place];  Service: Orthopedics;  Laterality: Right;    Prior to Admission medications   Medication Sig Start Date End Date Taking? Authorizing Provider  aspirin EC 81 MG EC tablet Take 1 tablet (81 mg total) by mouth daily. 05/21/18   Angiulli,  Lavon Paganini, PA-C  atorvastatin (LIPITOR) 80 MG tablet Take 1 tablet (80 mg total) by mouth daily. 07/24/20   Asencion Noble, MD  clotrimazole (LOTRIMIN) 1 % cream Apply to affected area 2 times daily 08/17/20   Volney American, PA-C  icosapent Ethyl (VASCEPA) 1 g capsule TAKE 2 CAPSULES (2 G TOTAL) BY MOUTH 2 (TWO) TIMES DAILY. 06/11/20 06/11/21  Belva Crome, MD  oxyCODONE-acetaminophen (PERCOCET) 5-325 MG tablet Take 1-2 tablets by mouth every 6 (six) hours as needed for pain 07/26/20   Leanora Cover, MD  SitaGLIPtin-MetFORMIN HCl [phone] MG TB24 Take 1 tablet by mouth daily. 08/06/20   Velna Ochs, MD  omega-3 acid ethyl esters (LOVAZA) 1 g capsule Take 1 capsule (1 g total) by mouth daily. 06/05/20 06/11/20  Jettie Booze, MD    Allergies Patient has no known allergies.  Family History  Problem Relation Age of Onset  . Heart disease Mother   . Lung cancer Father   . Diabetes Sister   . Diabetes Brother   . CVA Neg Hx   . Colon cancer Neg Hx   . Colon polyps Neg Hx   . Esophageal cancer Neg Hx   . Rectal cancer Neg Hx   . Stomach cancer Neg Hx     Social History Social History   Tobacco Use  . Smoking status: Former Smoker    Types: Cigarettes  . Smokeless tobacco: Never Used  . Tobacco comment: never a daily smoker  Vaping Use  . Vaping Use: Never used  Substance Use  Topics  . Alcohol use: No    Alcohol/week: 0.0 standard drinks  . Drug use: No     Review of Systems  Constitutional: No fever/chills Eyes:  No discharge ENT: No upper respiratory complaints. Respiratory: no cough. No SOB/ use of accessory muscles to breath Gastrointestinal:   No nausea, no vomiting.  No diarrhea.  No constipation. Musculoskeletal: Negative for musculoskeletal pain. Skin: Patient has penile irritation.     ____________________________________________   PHYSICAL EXAM:  VITAL SIGNS: ED Triage Vitals  Enc Vitals Group     BP 08/20/20 1200 (!) 145/93     Pulse  Rate 08/20/20 1200 65     Resp 08/20/20 1200 19     Temp 08/20/20 1203 98.6 F (37 C)     Temp Source 08/20/20 1200 Oral     SpO2 08/20/20 1200 100 %     Weight --      Height --      Head Circumference --      Peak Flow --      Pain Score 08/20/20 1201 4     Pain Loc --      Pain Edu? --      Excl. in [place]? --      Constitutional: Alert and oriented. Well appearing and in no acute distress. Eyes: Conjunctivae are normal. PERRL. EOMI. Head: Atraumatic. ENT: Cardiovascular: Normal rate, regular rhythm. Normal S1 and S2.  Good peripheral circulation. Respiratory: Normal respiratory effort without tachypnea or retractions. Lungs CTAB. Good air entry to the bases with no decreased or absent breath sounds Gastrointestinal: Bowel sounds x 4 quadrants. Soft and nontender to palpation. No guarding or rigidity. No distention. Musculoskeletal: Full range of motion to all extremities. No obvious deformities noted Neurologic:  Normal for age. No gross focal neurologic deficits are appreciated.  Skin: Patient has irritation between foreskin and head of penis with clumping white discharge. Psychiatric: Mood and affect are normal for age. Speech and behavior are normal.   ____________________________________________   LABS (all labs ordered are listed, but only abnormal results are displayed)  Labs Reviewed - No data to display ____________________________________________  EKG   ____________________________________________  RADIOLOGY   No results found.  ____________________________________________    PROCEDURES  Procedure(s) performed:     Procedures     Medications - No data to display   ____________________________________________   INITIAL IMPRESSION / ASSESSMENT AND PLAN / ED COURSE  Pertinent labs & imaging results that were available during my care of the patient were reviewed by me and considered in my medical decision making (see chart for details).       Assessment and plan Balanitis 55 year old male presents to the urgent care with history and physical exam findings consistent with balanitis.  Recommended continuing clotrimazole as directed for the next 4 weeks.  No evidence of paraphimosis.  Return precautions were given to return with new or worsening symptoms.     ____________________________________________  FINAL CLINICAL IMPRESSION(S) / ED DIAGNOSES  Final diagnoses:  Balanitis      NEW MEDICATIONS STARTED DURING THIS VISIT:  ED Discharge Orders    None          This chart was dictated using voice recognition software/Dragon. Despite best efforts to proofread, errors can occur which can change the meaning. Any change was purely unintentional.     Lannie Fields, PA-C 08/20/20 1235

## 2020-08-27 ENCOUNTER — Other Ambulatory Visit (HOSPITAL_COMMUNITY): Payer: Self-pay

## 2020-08-30 ENCOUNTER — Other Ambulatory Visit (HOSPITAL_COMMUNITY): Payer: Self-pay

## 2020-09-25 ENCOUNTER — Encounter: Payer: Self-pay | Admitting: *Deleted

## 2020-09-27 ENCOUNTER — Other Ambulatory Visit (HOSPITAL_COMMUNITY): Payer: Self-pay

## 2020-10-31 ENCOUNTER — Other Ambulatory Visit (HOSPITAL_COMMUNITY): Payer: Self-pay

## 2020-10-31 ENCOUNTER — Other Ambulatory Visit: Payer: Self-pay | Admitting: Internal Medicine

## 2020-10-31 ENCOUNTER — Other Ambulatory Visit: Payer: Self-pay

## 2020-10-31 DIAGNOSIS — E1165 Type 2 diabetes mellitus with hyperglycemia: Secondary | ICD-10-CM

## 2020-11-01 ENCOUNTER — Other Ambulatory Visit (HOSPITAL_COMMUNITY): Payer: Self-pay

## 2020-11-05 ENCOUNTER — Other Ambulatory Visit (HOSPITAL_COMMUNITY): Payer: Self-pay

## 2020-11-06 ENCOUNTER — Other Ambulatory Visit: Payer: Self-pay | Admitting: Internal Medicine

## 2020-11-06 ENCOUNTER — Other Ambulatory Visit (HOSPITAL_COMMUNITY): Payer: Self-pay

## 2020-11-06 DIAGNOSIS — E1165 Type 2 diabetes mellitus with hyperglycemia: Secondary | ICD-10-CM

## 2020-11-06 DIAGNOSIS — E7849 Other hyperlipidemia: Secondary | ICD-10-CM

## 2020-11-07 ENCOUNTER — Other Ambulatory Visit (HOSPITAL_COMMUNITY): Payer: Self-pay

## 2020-11-07 ENCOUNTER — Other Ambulatory Visit: Payer: Self-pay | Admitting: Internal Medicine

## 2020-11-07 DIAGNOSIS — E1165 Type 2 diabetes mellitus with hyperglycemia: Secondary | ICD-10-CM

## 2020-11-07 DIAGNOSIS — E7849 Other hyperlipidemia: Secondary | ICD-10-CM

## 2020-11-08 ENCOUNTER — Other Ambulatory Visit: Payer: Self-pay | Admitting: Internal Medicine

## 2020-11-08 ENCOUNTER — Other Ambulatory Visit (HOSPITAL_COMMUNITY): Payer: Self-pay

## 2020-11-08 DIAGNOSIS — E1165 Type 2 diabetes mellitus with hyperglycemia: Secondary | ICD-10-CM

## 2020-11-08 DIAGNOSIS — E7849 Other hyperlipidemia: Secondary | ICD-10-CM

## 2020-11-27 ENCOUNTER — Other Ambulatory Visit: Payer: Self-pay

## 2020-11-27 ENCOUNTER — Other Ambulatory Visit: Payer: Self-pay | Admitting: Internal Medicine

## 2020-11-27 DIAGNOSIS — E1165 Type 2 diabetes mellitus with hyperglycemia: Secondary | ICD-10-CM

## 2020-11-28 ENCOUNTER — Other Ambulatory Visit (HOSPITAL_COMMUNITY): Payer: Self-pay

## 2020-11-28 MED ORDER — JANUMET XR 100-1000 MG PO TB24
1.0000 | ORAL_TABLET | Freq: Every day | ORAL | 2 refills | Status: DC
  Filled 2020-11-28: qty 30, 30d supply, fill #0

## 2020-11-29 ENCOUNTER — Other Ambulatory Visit (HOSPITAL_COMMUNITY): Payer: Self-pay

## 2020-11-29 ENCOUNTER — Other Ambulatory Visit: Payer: Self-pay | Admitting: Internal Medicine

## 2020-11-29 DIAGNOSIS — E7849 Other hyperlipidemia: Secondary | ICD-10-CM

## 2020-11-30 ENCOUNTER — Other Ambulatory Visit (HOSPITAL_COMMUNITY): Payer: Self-pay

## 2020-11-30 MED ORDER — ATORVASTATIN CALCIUM 80 MG PO TABS
80.0000 mg | ORAL_TABLET | Freq: Every day | ORAL | 0 refills | Status: DC
  Filled 2020-11-30: qty 90, 90d supply, fill #0

## 2021-04-25 ENCOUNTER — Other Ambulatory Visit (HOSPITAL_COMMUNITY): Payer: Self-pay

## 2021-04-25 ENCOUNTER — Encounter: Payer: Medicare Other | Admitting: Student

## 2021-04-25 ENCOUNTER — Ambulatory Visit (INDEPENDENT_AMBULATORY_CARE_PROVIDER_SITE_OTHER): Payer: Medicare Other | Admitting: Internal Medicine

## 2021-04-25 ENCOUNTER — Encounter: Payer: Self-pay | Admitting: Dietician

## 2021-04-25 ENCOUNTER — Ambulatory Visit (INDEPENDENT_AMBULATORY_CARE_PROVIDER_SITE_OTHER): Payer: Medicare Other | Admitting: Dietician

## 2021-04-25 ENCOUNTER — Other Ambulatory Visit: Payer: Self-pay

## 2021-04-25 ENCOUNTER — Encounter: Payer: Self-pay | Admitting: Student

## 2021-04-25 VITALS — BP 122/63 | HR 52 | Temp 98.1°F | Resp 28 | Ht 64.0 in | Wt 149.9 lb

## 2021-04-25 DIAGNOSIS — E1165 Type 2 diabetes mellitus with hyperglycemia: Secondary | ICD-10-CM | POA: Diagnosis present

## 2021-04-25 DIAGNOSIS — R252 Cramp and spasm: Secondary | ICD-10-CM | POA: Insufficient documentation

## 2021-04-25 DIAGNOSIS — M62838 Other muscle spasm: Secondary | ICD-10-CM | POA: Diagnosis not present

## 2021-04-25 DIAGNOSIS — Z23 Encounter for immunization: Secondary | ICD-10-CM

## 2021-04-25 DIAGNOSIS — Z0000 Encounter for general adult medical examination without abnormal findings: Secondary | ICD-10-CM

## 2021-04-25 DIAGNOSIS — E785 Hyperlipidemia, unspecified: Secondary | ICD-10-CM

## 2021-04-25 DIAGNOSIS — E1169 Type 2 diabetes mellitus with other specified complication: Secondary | ICD-10-CM

## 2021-04-25 DIAGNOSIS — E7849 Other hyperlipidemia: Secondary | ICD-10-CM | POA: Diagnosis not present

## 2021-04-25 DIAGNOSIS — I69351 Hemiplegia and hemiparesis following cerebral infarction affecting right dominant side: Secondary | ICD-10-CM | POA: Diagnosis not present

## 2021-04-25 LAB — POCT GLYCOSYLATED HEMOGLOBIN (HGB A1C): Hemoglobin A1C: 8.3 % — AB (ref 4.0–5.6)

## 2021-04-25 LAB — GLUCOSE, CAPILLARY: Glucose-Capillary: 253 mg/dL — ABNORMAL HIGH (ref 70–99)

## 2021-04-25 MED ORDER — ATORVASTATIN CALCIUM 80 MG PO TABS
80.0000 mg | ORAL_TABLET | Freq: Every day | ORAL | 0 refills | Status: DC
  Filled 2021-04-25: qty 90, 90d supply, fill #0

## 2021-04-25 MED ORDER — INSULIN PEN NEEDLE 32G X 4 MM MISC
0 refills | Status: DC
  Filled 2021-04-25: qty 100, 90d supply, fill #0

## 2021-04-25 MED ORDER — LEVEMIR FLEXTOUCH 100 UNIT/ML ~~LOC~~ SOPN
8.0000 [IU] | PEN_INJECTOR | Freq: Every day | SUBCUTANEOUS | 11 refills | Status: DC
  Filled 2021-04-25: qty 6, 75d supply, fill #0

## 2021-04-25 MED ORDER — JANUMET XR 100-1000 MG PO TB24
1.0000 | ORAL_TABLET | Freq: Every day | ORAL | 2 refills | Status: DC
  Filled 2021-04-25: qty 30, 30d supply, fill #0
  Filled 2021-05-22: qty 30, 30d supply, fill #1
  Filled 2021-06-22: qty 30, 30d supply, fill #2

## 2021-04-25 MED ORDER — METHOCARBAMOL 500 MG PO TABS
500.0000 mg | ORAL_TABLET | Freq: Three times a day (TID) | ORAL | 0 refills | Status: DC | PRN
  Filled 2021-04-25: qty 42, 14d supply, fill #0

## 2021-04-25 MED ORDER — BLOOD GLUCOSE MONITOR SYSTEM W/DEVICE KIT
PACK | 0 refills | Status: DC
  Filled 2021-04-25: qty 1, 1d supply, fill #0

## 2021-04-25 NOTE — Assessment & Plan Note (Addendum)
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 Reports compliance on this. A1c today 8.3. Poor candidate for SGLT2 therapy given recent balanitis.   Plan: - start insulin  therapy today, 8 units long acting - glucometer, strips, lancets - continue janumet  - follow up 2 weeks   >>ASSESSMENT AND PLAN FOR DIABETES MELLITUS (HCC) WRITTEN ON 04/25/2021 11:31 AM BY DEMAIO, ALEXA, MD  Continue high intensity statin therapy. - refill lipitor  80

## 2021-04-25 NOTE — Addendum Note (Signed)
Addended by: Inda Coke on: 04/25/2021 01:22 PM   Modules accepted: Orders

## 2021-04-25 NOTE — Assessment & Plan Note (Signed)
-   flu shot today

## 2021-04-25 NOTE — Assessment & Plan Note (Signed)
Continue high intensity statin therapy. - refill lipitor 80

## 2021-04-25 NOTE — Assessment & Plan Note (Signed)
Patient reports right sided weakness is still present, sometimes gives him difficulty with walking. He endorses some pains/ muscle spasms in his right upper extremity as well as his right lower extremity. Weakness on exam appears mild.   Plan: - refill lipitor today - continue aspirin

## 2021-04-25 NOTE — Assessment & Plan Note (Addendum)
Patient complaining of neck tightness of his left upper shoulder/neck. On exam there is palpable muscle tightness along these areas.  Plan: - prescribe short course of robaxin

## 2021-04-25 NOTE — Progress Notes (Signed)
° °  CC: Check up  HPI:  [name] is a 56 y.o. PMH noted below, who presents to the [facility] with complaints of general check up. To see the management of his acute and chronic conditions, please refer to the A&P note under the encounters tab.   Past Medical History:  Diagnosis Date   Burning with urination 08/06/2020   Diabetes mellitus without complication ([place]) 6389   Hyperlipidemia    Stroke ([place]) 05/06/2018   R sided weakness   Review of Systems:  negative for fever, chills, nausea, positive for right sided weakness, neck pain, muscle spasms  Physical Exam: Gen: WNWD in NAD HEENT: normocephalic atraumatic, MMM, neck supple CV: RRR, no m/r/g  no pedal edema Resp: CTAB, normal WOB  GI: soft, nontender MSK: Mild right sided weakness, worse in the RUE; muscle tightness and tenderness along the left shoulder and left neck area Skin:warm and dry Neuro:alert answering questions appropriately Psych: normal affect   Assessment & Plan:   See Encounters Tab for problem based charting.  Patient discussed with Dr. Philipp Ovens

## 2021-04-25 NOTE — Patient Instructions (Signed)
Arthur Howard  It was a pleasure seeing you in the clinic today.   We talked about your blood sugars and your neck pain.  For your blood sugars you will start taking insulin. Check your sugars every morning and come back and see [place] in two weeks to see how things are going.  Insulin- take 8 units every morning.  Please call our clinic at [phone] if you have any questions or concerns. The best time to call is Monday-Friday from 9am-4pm, but there is someone available 24/7 at the same number. If you need medication refills, please notify your pharmacy one week in advance and they will send [place] a request.   Thank you for letting us take part in your care. We look forward to seeing you next time!

## 2021-04-25 NOTE — Progress Notes (Signed)
Diabetes Self-Management Education  Visit Type: First/Initial  Appt. Start Time: 1205 Appt. End Time: 7062  04/25/2021  [name], identified by name and date of birth, is a 56 y.o. male with a diagnosis of Diabetes: Type 2.   ASSESSMENT  There were no vitals taken for this visit. There is no height or weight on file to calculate BMI.   Diabetes Self-Management Education - 04/25/21 1400       Visit Information   Visit Type First/Initial      Initial Visit   Diabetes Type Type 2    Are you currently following a meal plan? No    Are you taking your medications as prescribed? Yes    Date Diagnosed 2016      Psychosocial Assessment   Self-care barriers English as a second language    Self-management support Family    Other persons present Interpreter;Family Member    Patient Concerns Glycemic Control    Preferred Learning Style Hands on;Auditory;Visual    Learning Readiness Ready    How often do you need to have someone help you when you read instructions, pamphlets, or other written materials from your doctor or pharmacy? 3 - Sometimes      Pre-Education Assessment   Patient understands using medications safely. Needs Instruction    Patient understands monitoring blood glucose, interpreting and using results Demonstrates understanding / competency    Patient understands prevention, detection, and treatment of acute complications. Needs Instruction      Complications   How often do you check your blood sugar? 0 times/day (not testing)      Patient Education   Medications Taught/reviewed insulin injection, site rotation, insulin storage and needle disposal.;Reviewed patients medication for diabetes, action, purpose, timing of dose and side effects.      Individualized Goals (developed by patient)   Medications take my medication as prescribed      Outcomes   Expected Outcomes Demonstrated interest in learning. Expect positive outcomes    Future DMSE 4-6 wks     Program Status Not Completed   plan to complete assessment at futere visit. we only had time for insulin education today as translation takes longer            Individualized Plan for Diabetes Self-Management Training:   Learning Objective:  Patient will have a greater understanding of diabetes self-management. Patient education plan is to attend individual and/or group sessions per assessed needs and concerns.   Plan:   There are no Patient Instructions on file for this visit.  Expected Outcomes:  Demonstrated interest in learning. Expect positive outcomes  Education material provided: Diabetes Resources  If problems or questions, patient to contact team via:  Phone  Future DSME appointment: 4-6 wks Debera Lat, RD 04/25/2021 2:09 PM.

## 2021-04-26 ENCOUNTER — Other Ambulatory Visit (HOSPITAL_COMMUNITY): Payer: Self-pay

## 2021-04-26 LAB — HM DIABETES EYE EXAM

## 2021-05-07 NOTE — Progress Notes (Signed)
Internal Medicine Clinic Attending ° °Case discussed with Dr. DeMaio  At the time of the visit.  We reviewed the resident’s history and exam and pertinent patient test results.  I agree with the assessment, diagnosis, and plan of care documented in the resident’s note. ° ° °

## 2021-05-09 ENCOUNTER — Other Ambulatory Visit (HOSPITAL_COMMUNITY): Payer: Self-pay

## 2021-05-09 ENCOUNTER — Ambulatory Visit (INDEPENDENT_AMBULATORY_CARE_PROVIDER_SITE_OTHER): Payer: Medicare Other | Admitting: Student

## 2021-05-09 VITALS — BP 132/67 | HR 66 | Temp 97.8°F | Wt 149.2 lb

## 2021-05-09 DIAGNOSIS — E113393 Type 2 diabetes mellitus with moderate nonproliferative diabetic retinopathy without macular edema, bilateral: Secondary | ICD-10-CM

## 2021-05-09 DIAGNOSIS — I69398 Other sequelae of cerebral infarction: Secondary | ICD-10-CM

## 2021-05-09 DIAGNOSIS — R252 Cramp and spasm: Secondary | ICD-10-CM

## 2021-05-09 DIAGNOSIS — E113399 Type 2 diabetes mellitus with moderate nonproliferative diabetic retinopathy without macular edema, unspecified eye: Secondary | ICD-10-CM | POA: Insufficient documentation

## 2021-05-09 DIAGNOSIS — I639 Cerebral infarction, unspecified: Secondary | ICD-10-CM | POA: Diagnosis not present

## 2021-05-09 DIAGNOSIS — M62838 Other muscle spasm: Secondary | ICD-10-CM

## 2021-05-09 DIAGNOSIS — E1169 Type 2 diabetes mellitus with other specified complication: Secondary | ICD-10-CM | POA: Diagnosis present

## 2021-05-09 DIAGNOSIS — E1165 Type 2 diabetes mellitus with hyperglycemia: Secondary | ICD-10-CM | POA: Diagnosis not present

## 2021-05-09 DIAGNOSIS — E785 Hyperlipidemia, unspecified: Secondary | ICD-10-CM | POA: Diagnosis not present

## 2021-05-09 MED ORDER — METHOCARBAMOL 500 MG PO TABS
500.0000 mg | ORAL_TABLET | Freq: Three times a day (TID) | ORAL | 0 refills | Status: AC | PRN
  Filled 2021-05-09: qty 42, 14d supply, fill #0

## 2021-05-09 NOTE — Patient Instructions (Addendum)
[name],  Fue un placer verte hoy en la clnica.  1. Tus lecturas de azcar se ven mucho mejor, as que sigue as. Comience a [employer] al da (una vez por la maana y otra alrededor de las 5 p. m.). 2. Tiene recargas para su insulina en la farmacia. Puedes recogerlo cuando ests a punto de agotarte. 3. Estamos revisando sus niveles de colesterol hoy. 4. Boling segn sea necesario para los espasmos musculares. 5. Vuelva en 2 meses y medio (10 semanas) para su prximo control de azcar de 3 meses.  Llame a nuestra clnica al [phone] si tiene alguna pregunta o inquietud. El mejor horario para llamar es de lunes a viernes de 9 a. m. a 4 p. m., pero hay alguien disponible las 24 horas del da, los 7 das de la semana en el mismo nmero. Si necesita reposicin de medicamentos, por favor notifique a su farmacia con una semana de anticipacin y ellos nos enviarn una solicitud.  Gracias por dejarnos participar en su cuidado. Esperamos verte la prxima vez!

## 2021-05-09 NOTE — Progress Notes (Signed)
° °  CC: f/u T2DM  HPI:  [name] is a 56 y.o. male with history listed below presenting to the [facility] for f/u of T2DM. Please see individualized problem based charting for full HPI.  Past Medical History:  Diagnosis Date   Burning with urination 08/06/2020   Diabetes mellitus without complication ([place]) 7106   Hyperlipidemia    Stroke ([place]) 05/06/2018   R sided weakness    Review of Systems:  Negative aside from that listed in individualized problem based charting.  Physical Exam:  Vitals:   05/09/21 1016  BP: 132/67  Pulse: 66  Temp: 97.8 F (36.6 C)  TempSrc: Oral  SpO2: 99%  Weight: 149 lb 3.2 oz (67.7 kg)   Physical Exam Constitutional:      Appearance: Normal appearance.  HENT:     Mouth/Throat:     Mouth: Mucous membranes are moist.     Pharynx: Oropharynx is clear. No oropharyngeal exudate.  Eyes:     Extraocular Movements: Extraocular movements intact.  Cardiovascular:     Rate and Rhythm: Normal rate and regular rhythm.     Pulses: Normal pulses.     Heart sounds: Normal heart sounds. No murmur heard.   No gallop.  Pulmonary:     Effort: Pulmonary effort is normal.     Breath sounds: Normal breath sounds. No wheezing, rhonchi or rales.  Abdominal:     General: Bowel sounds are normal. There is no distension.     Palpations: Abdomen is soft.     Tenderness: There is no abdominal tenderness.  Musculoskeletal:        General: Normal range of motion.  Skin:    General: Skin is warm and dry.  Neurological:     Mental Status: He is alert and oriented to person, place, and time. Mental status is at baseline.  Psychiatric:        Mood and Affect: Mood normal.        Behavior: Behavior normal.     Assessment & Plan:   See Encounters Tab for problem based charting.  Patient discussed with Dr.  Saverio Danker

## 2021-05-09 NOTE — Assessment & Plan Note (Addendum)
Patient reports intermittent muscle spasms in right arm and right leg that started after his stroke.  Notes that they were slightly more transient in the past and were more bearable, but he has noticed more frequent recurrence of muscle spasms over the past couple of months.  States that these muscle spasms hinder his ability to exercise.  He did find some benefit with Robaxin use, which was prescribed at last visit 2 weeks ago.  States that he used Robaxin once daily with relief of muscle spasms but noted that they recurred later on in the day.  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  Recommended using Robaxin twice daily, once in the morning and once in the evening, to help with controlling spasticity.  I am hopeful that treating muscle spasticity may help patient become more active.  Plan: -Refilled Robaxin, use twice daily as needed for spasticity relief -Moderate intensity exercise

## 2021-05-09 NOTE — Assessment & Plan Note (Signed)
Patient with history of dyslipidemia, on Lipitor 80 mg daily.  Last lipid panel about 1 year ago with LDL of 91.  We will repeat lipid panel today.  Plan: -Follow-up lipid panel -Continue Lipitor

## 2021-05-09 NOTE — Assessment & Plan Note (Addendum)
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  He was recently seen in the clinic 2 weeks ago and found to have A1c of 8.3%.  He was started on Levemir  8 units daily at that time.  Patient reports doing well since last visit.  Unfortunately he did not bring his meter today, but did bring notebook with morning blood glucose values.  Since last visit, morning blood glucose levels have ranged from 93-126 which shows adequate glycemic control.  He has been avoiding high sugar meals and soft drinks.  Of note, patient is not on SGLT2 inhibitor therapy due to recent episode of balanitis.  We will need to consider discussing addition of GLP-1 agonist therapy for cardiovascular benefit given history of stroke.  However, important to avoid semaglutide given recent diagnosis of moderate diabetic retinopathy as semaglutide can worsen this.  Advised patient to bring CGM to next visit into continue current therapy in the meantime.  Advised patient to incorporate moderate intensity exercise for greater than 150 minutes/week.  This has been limited due to muscle spasticity in right arm and right leg which I suspect is secondary to poststroke spasticity (see separate problem).  Plan: -Continue Janumet  and Levemir  -Consider addition of GLP-1 agonist for cardiovascular benefit -Follow-up in 10 weeks for repeat A1c -Moderate intensity exercise   >>ASSESSMENT AND PLAN FOR DIABETES MELLITUS (HCC) WRITTEN ON 05/09/2021  1:34 PM BY EMMY ALF, MD  Patient with history of dyslipidemia, on Lipitor  80 mg daily.  Last lipid panel about 1 year ago with LDL of 91.  We will repeat lipid panel today.  Plan: -Follow-up  lipid panel -Continue Lipitor

## 2021-05-10 LAB — LIPID PANEL
Chol/HDL Ratio: 3.4 ratio (ref 0.0–5.0)
Cholesterol, Total: 122 mg/dL (ref 100–199)
HDL: 36 mg/dL — ABNORMAL LOW (ref >39)
LDL Chol Calc (NIH): 56 mg/dL (ref 0–99)
Triglycerides: 178 mg/dL — ABNORMAL HIGH (ref 0–149)
VLDL Cholesterol Cal: 30 mg/dL (ref 5–40)

## 2021-05-13 NOTE — Progress Notes (Signed)
Internal Medicine Clinic Attending  Case discussed with Dr. Jinwala  At the time of the visit.  We reviewed the resident's history and exam and pertinent patient test results.  I agree with the assessment, diagnosis, and plan of care documented in the resident's note.  

## 2021-05-22 ENCOUNTER — Other Ambulatory Visit (HOSPITAL_COMMUNITY): Payer: Self-pay

## 2021-05-23 ENCOUNTER — Other Ambulatory Visit (HOSPITAL_COMMUNITY): Payer: Self-pay

## 2021-05-30 ENCOUNTER — Ambulatory Visit: Payer: Medicare Other | Admitting: Dietician

## 2021-06-14 ENCOUNTER — Other Ambulatory Visit (HOSPITAL_COMMUNITY): Payer: Self-pay

## 2021-06-14 MED ORDER — FAMOTIDINE 40 MG PO TABS
40.0000 mg | ORAL_TABLET | Freq: Every day | ORAL | 0 refills | Status: DC
  Filled 2021-06-14: qty 10, 10d supply, fill #0

## 2021-06-15 ENCOUNTER — Emergency Department (HOSPITAL_BASED_OUTPATIENT_CLINIC_OR_DEPARTMENT_OTHER): Payer: Medicare HMO | Admitting: Radiology

## 2021-06-15 ENCOUNTER — Other Ambulatory Visit: Payer: Self-pay

## 2021-06-15 ENCOUNTER — Encounter (HOSPITAL_BASED_OUTPATIENT_CLINIC_OR_DEPARTMENT_OTHER): Payer: Self-pay

## 2021-06-15 DIAGNOSIS — J029 Acute pharyngitis, unspecified: Secondary | ICD-10-CM | POA: Insufficient documentation

## 2021-06-15 DIAGNOSIS — R110 Nausea: Secondary | ICD-10-CM | POA: Insufficient documentation

## 2021-06-15 DIAGNOSIS — Z7982 Long term (current) use of aspirin: Secondary | ICD-10-CM | POA: Insufficient documentation

## 2021-06-15 DIAGNOSIS — Z794 Long term (current) use of insulin: Secondary | ICD-10-CM | POA: Insufficient documentation

## 2021-06-15 DIAGNOSIS — E1165 Type 2 diabetes mellitus with hyperglycemia: Secondary | ICD-10-CM | POA: Insufficient documentation

## 2021-06-15 DIAGNOSIS — R0982 Postnasal drip: Secondary | ICD-10-CM | POA: Diagnosis not present

## 2021-06-15 DIAGNOSIS — R0789 Other chest pain: Secondary | ICD-10-CM | POA: Diagnosis not present

## 2021-06-15 DIAGNOSIS — R079 Chest pain, unspecified: Secondary | ICD-10-CM | POA: Diagnosis present

## 2021-06-15 LAB — COMPREHENSIVE METABOLIC PANEL
ALT: 29 U/L (ref 0–44)
AST: 23 U/L (ref 15–41)
Albumin: 4.1 g/dL (ref 3.5–5.0)
Alkaline Phosphatase: 76 U/L (ref 38–126)
Anion gap: 10 (ref 5–15)
BUN: 17 mg/dL (ref 6–20)
CO2: 25 mmol/L (ref 22–32)
Calcium: 9.1 mg/dL (ref 8.9–10.3)
Chloride: 107 mmol/L (ref 98–111)
Creatinine, Ser: 0.93 mg/dL (ref 0.61–1.24)
GFR, Estimated: >60 mL/min (ref >60)
Glucose, Bld: 189 mg/dL — ABNORMAL HIGH (ref 70–99)
Potassium: 3.9 mmol/L (ref 3.5–5.1)
Sodium: 142 mmol/L (ref 135–145)
Total Bilirubin: 0.5 mg/dL (ref 0.3–1.2)
Total Protein: 6.8 g/dL (ref 6.5–8.1)

## 2021-06-15 LAB — TROPONIN I (HIGH SENSITIVITY): Troponin I (High Sensitivity): <2 ng/L (ref <18)

## 2021-06-15 LAB — CBC
HCT: 42.2 % (ref 39.0–52.0)
Hemoglobin: 14.7 g/dL (ref 13.0–17.0)
MCH: 31.1 pg (ref 26.0–34.0)
MCHC: 34.8 g/dL (ref 30.0–36.0)
MCV: 89.2 fL (ref 80.0–100.0)
Platelets: 237 10*3/uL (ref 150–400)
RBC: 4.73 MIL/uL (ref 4.22–5.81)
RDW: 12.5 % (ref 11.5–15.5)
WBC: 7.5 10*3/uL (ref 4.0–10.5)
nRBC: 0 % (ref 0.0–0.2)

## 2021-06-15 NOTE — ED Triage Notes (Signed)
Presented via triage with c/o sore throat that started 3 days ago. Seen at urgent care yesterday and was told it was allergies.  ? ?Pt admits to having sharp central cp that started today. Denies n/v ? ?Tested negative for strep and covid   ?

## 2021-06-16 ENCOUNTER — Emergency Department (HOSPITAL_BASED_OUTPATIENT_CLINIC_OR_DEPARTMENT_OTHER): Payer: Medicare HMO

## 2021-06-16 ENCOUNTER — Emergency Department (HOSPITAL_BASED_OUTPATIENT_CLINIC_OR_DEPARTMENT_OTHER)
Admission: EM | Admit: 2021-06-16 | Discharge: 2021-06-16 | Disposition: A | Discharge status: Home / Self Care | Payer: Medicare HMO | Attending: Emergency Medicine | Admitting: Emergency Medicine

## 2021-06-16 DIAGNOSIS — R0789 Other chest pain: Secondary | ICD-10-CM | POA: Diagnosis not present

## 2021-06-16 DIAGNOSIS — R0982 Postnasal drip: Secondary | ICD-10-CM

## 2021-06-16 LAB — TROPONIN I (HIGH SENSITIVITY): Troponin I (High Sensitivity): <2 ng/L (ref <18)

## 2021-06-16 MED ORDER — LIDOCAINE VISCOUS HCL 2 % MT SOLN
15.0000 mL | Freq: Once | OROMUCOSAL | Status: AC
Start: 2021-06-16 — End: 2021-06-16
  Administered 2021-06-16: 15 mL via OROMUCOSAL
  Filled 2021-06-16: qty 15

## 2021-06-16 NOTE — ED Provider Notes (Signed)
?[facility] ?Provider Note ? ?CSN: [id number] ?Arrival date & time: 06/15/21 2147 ? ?Chief Complaint(s) ?Sore Throat and Chest Pain ? ?HPI ?Arthur Howard is a 56 y.o. male with history of hyperlipidemia, diabetes and stroke. ? ?The history is provided by the patient.  ?Sore Throat ?This is a new (itching) problem. Episode onset: 4 days. Episode frequency: intermittent. The problem has not changed since onset.Associated symptoms include chest pain. Pertinent negatives include no shortness of breath. Nothing aggravates the symptoms. Nothing relieves the symptoms.  ?Chest Pain ?Chest pain location: upper chest. ?Pain quality: burning   ?Pain severity:  Mild ?Duration: last a few sec - mins then goes away. last felt it 7 hours ago. ?Timing:  Intermittent ?Progression:  Waxing and waning ?Relieved by:  Nothing ?Worsened by:  Nothing ?Associated symptoms: nausea   ?Associated symptoms: no cough, no fever, no shortness of breath and no vomiting   ?Risk factors: diabetes mellitus, high cholesterol and male sex   ?Risk factors: no hypertension   ? ?Past Medical History ?Past Medical History:  ?Diagnosis Date  ? Burning with urination 08/06/2020  ? Diabetes mellitus without complication ([place]) 5093  ? Hyperlipidemia   ? Stroke ([place]) 05/06/2018  ? R sided weakness  ? ?Patient Active Problem List  ? Diagnosis Date Noted  ? 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 type 2 diabetes mellitus ([place]) 05/09/2021  ? Spasticity as late effect of cerebrovascular accident (CVA) 04/25/2021  ? Healthcare maintenance 04/25/2021  ? 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 Left pontine cerebrovascular accident ([place]) 05/11/2018  ? Uncontrolled type 2 diabetes mellitus with hyperglycemia ([place])   ? Dyslipidemia associated with type 2 diabetes mellitus ([place]) 08/12/2014  ? ?Home Medication(s) ?Prior to Admission medications    ?Medication Sig Start Date End Date Taking? Authorizing Provider  ?aspirin EC 81 MG EC tablet Take 1 tablet (81 mg total) by mouth daily. 05/21/18   Angiulli, Lavon Paganini, PA-C  ?atorvastatin (LIPITOR) 80 MG tablet Take 1 tablet (80 mg total) by mouth daily. 04/25/21   Scarlett Presto, MD  ?Blood Glucose Monitoring Suppl (BLOOD GLUCOSE MONITOR SYSTEM) w/Device KIT Use as directed up to 4 times daily 04/25/21   Scarlett Presto, MD  ?clotrimazole (LOTRIMIN) 1 % cream Apply to affected area 2 times daily 08/17/20   Volney American, PA-C  ?famotidine (PEPCID) 40 MG tablet Take 1 tablet (40 mg total) by mouth daily for 10 days. 06/14/21 06/24/21    ?icosapent Ethyl (VASCEPA) 1 g capsule TAKE 2 CAPSULES (2 G TOTAL) BY MOUTH 2 (TWO) TIMES DAILY. 06/11/20 06/11/21  Belva Crome, MD  ?insulin detemir (LEVEMIR FLEXTOUCH) 100 UNIT/ML FlexPen Inject 8 Units into the skin daily. 04/25/21   Scarlett Presto, MD  ?Insulin Pen Needle 32G X 4 MM MISC Use with levemir 04/25/21     ?SitaGLIPtin-MetFORMIN HCl (JANUMET XR) [id number] MG TB24 Take 1 tablet by mouth daily. 04/25/21   Scarlett Presto, MD  ?blood glucose meter kit and supplies KIT Dispense based on patient and insurance preference. Use up to four times daily as directed. (FOR ICD-9 250.00, 250.01). ?Patient not taking: Reported on 01/25/2020 05/21/18 01/25/20  Angiulli, Lavon Paganini, PA-C  ?omega-3 acid ethyl esters (LOVAZA) 1 g capsule Take 1 capsule (1 g total) by mouth daily. 06/05/20 06/11/20  Jettie Booze, MD  ?                                                                                                                                  ?  Allergies ?Patient has no known allergies. ? ?Review of Systems ?Review of Systems  ?Constitutional:  Negative for fever.  ?Respiratory:  Negative for cough and shortness of breath.   ?Cardiovascular:  Positive for chest pain.  ?Gastrointestinal:  Positive for nausea. Negative for vomiting.  ?As noted in HPI ? ?Physical Exam ?Vital Signs  ?I have reviewed  the triage vital signs ?BP 124/64   Pulse (!) 57   Temp 97.9 ?F (36.6 ?C)   Resp 20   Ht '5\' 4"'  (1.626 m)   Wt 70.3 kg   SpO2 99%   BMI 26.61 kg/m?  ? ?Physical Exam ?Vitals reviewed.  ?Constitutional:   ?   General: He is not in acute distress. ?   Appearance: He is well-developed. He is not diaphoretic.  ?HENT:  ?   Head: Normocephalic and atraumatic.  ?   Nose: Nose normal.  ?   Mouth/Throat:  ?   Tongue: No lesions.  ?   Palate: No lesions.  ?   Pharynx: No pharyngeal swelling or posterior oropharyngeal erythema.  ?   Tonsils: No tonsillar exudate.  ?   Comments: Post nasal drip with cobblestoning ?Eyes:  ?   General: No scleral icterus.    ?   Right eye: No discharge.     ?   Left eye: No discharge.  ?   Conjunctiva/sclera: Conjunctivae normal.  ?   Pupils: Pupils are equal, round, and reactive to light.  ?Cardiovascular:  ?   Rate and Rhythm: Normal rate and regular rhythm.  ?   Heart sounds: No murmur heard. ?  No friction rub. No gallop.  ?Pulmonary:  ?   Effort: Pulmonary effort is normal. No respiratory distress.  ?   Breath sounds: Normal breath sounds. No stridor. No rales.  ?Abdominal:  ?   General: There is no distension.  ?   Palpations: Abdomen is soft.  ?   Tenderness: There is no abdominal tenderness.  ?Musculoskeletal:     ?   General: No tenderness.  ?   Cervical back: Normal range of motion and neck supple.  ?Skin: ?   General: Skin is warm and dry.  ?   Findings: No erythema or rash.  ?Neurological:  ?   Mental Status: He is alert and oriented to person, place, and time.  ? ? ?ED Results and Treatments ?Labs ?(all labs ordered are listed, but only abnormal results are displayed) ?Labs Reviewed  ?COMPREHENSIVE METABOLIC PANEL - Abnormal; Notable for the following components:  ?    Result Value  ? Glucose, Bld 189 (*)   ? All other components within normal limits  ?CBC  ?TROPONIN I (HIGH SENSITIVITY)  ?TROPONIN I (HIGH SENSITIVITY)  ?                                                                                                                        ?EKG ? EKG Interpretation ? ?Date/Time:  Saturday June 15 2021 22:33:13 EDT ?  Ventricular Rate:  66 ?PR Interval:  164 ?QRS Duration: 84 ?QT Interval:  362 ?QTC Calculation: 379 ?R Axis:   45 ?Text Interpretation: Normal sinus rhythm Normal ECG When compared with ECG of 24-Jan-2020 07:59, new TWI in III Otherwise no significant change Confirmed by Addison Lank [phone]) on 06/15/2021 11:11:42 PM ?  ? ?  ? ?Radiology ?DG Chest Port 1 View ? ?Result Date: 06/16/2021 ?CLINICAL DATA:  Sore throat for several days EXAM: PORTABLE CHEST 1 VIEW COMPARISON:  01/24/2020 FINDINGS: The heart size and mediastinal contours are within normal limits. Both lungs are clear. The visualized skeletal structures are unremarkable. IMPRESSION: No active disease. Electronically Signed   By: Inez Catalina M.D.   On: 06/16/2021 01:33   ? ?Pertinent labs & imaging results that were available during my care of the patient were reviewed by me and considered in my medical decision making (see MDM for details). ? ?Medications Ordered in ED ?Medications  ?lidocaine (XYLOCAINE) 2 % viscous mouth solution 15 mL (15 mLs Mouth/Throat Given 06/16/21 0209)  ?                                                               ?                                                                    ?Procedures ?Procedures ? ?(including critical care time) ? ?Medical Decision Making / ED Course ? ? ? Complexity of Problem: ? ?Co-morbidities/SDOH that complicate the patient evaluation/care: ?Noted in HPI ? ?Additional history obtained: ?none ? ?Patient's presenting problem/concern and DDX listed below: ?Throat itching ?No evidence of pharyngitis ?Patient has evidence of postnasal drip.  Likely seasonal allergies. ?Chest pain ?Given the location, upper chest, this is likely related to his seasonal allergies or reflux. ?Highly atypical for ACS, but given patient's comorbidities will rule it out with EKG and  serial troponins. ?We will also assess for pneumonia, pneumothorax. ?Doubt pulmonary embolism, dissection. ? ? ?  Complexity of Data: ?  ?Cardiac Monitoring: ?The patient was maintained on a cardiac monitor.   ?I

## 2021-06-24 ENCOUNTER — Other Ambulatory Visit: Payer: Self-pay | Admitting: Internal Medicine

## 2021-06-24 ENCOUNTER — Other Ambulatory Visit (HOSPITAL_COMMUNITY): Payer: Self-pay

## 2021-06-24 DIAGNOSIS — E1165 Type 2 diabetes mellitus with hyperglycemia: Secondary | ICD-10-CM

## 2021-06-24 MED ORDER — ACCU-CHEK SOFTCLIX LANCETS MISC
12 refills | Status: DC
  Filled 2021-06-24: qty 100, 25d supply, fill #0

## 2021-06-24 MED ORDER — ACCU-CHEK GUIDE VI STRP
ORAL_STRIP | 12 refills | Status: DC
  Filled 2021-06-24: qty 100, 100d supply, fill #0
  Filled 2021-06-24: qty 100, 25d supply, fill #0

## 2021-06-24 MED ORDER — ACCU-CHEK GUIDE ME W/DEVICE KIT
PACK | 0 refills | Status: DC
  Filled 2021-06-24: qty 1, 1d supply, fill #0

## 2021-06-25 ENCOUNTER — Other Ambulatory Visit (HOSPITAL_COMMUNITY): Payer: Self-pay

## 2021-06-26 ENCOUNTER — Other Ambulatory Visit: Payer: Self-pay

## 2021-06-26 ENCOUNTER — Other Ambulatory Visit (HOSPITAL_COMMUNITY): Payer: Self-pay

## 2021-06-26 ENCOUNTER — Ambulatory Visit (INDEPENDENT_AMBULATORY_CARE_PROVIDER_SITE_OTHER): Payer: Medicare HMO | Admitting: Internal Medicine

## 2021-06-26 ENCOUNTER — Encounter: Payer: Self-pay | Admitting: Internal Medicine

## 2021-06-26 VITALS — BP 121/70 | HR 72 | Temp 98.1°F | Ht 64.0 in | Wt 149.4 lb

## 2021-06-26 DIAGNOSIS — E1169 Type 2 diabetes mellitus with other specified complication: Secondary | ICD-10-CM | POA: Diagnosis not present

## 2021-06-26 DIAGNOSIS — E1165 Type 2 diabetes mellitus with hyperglycemia: Secondary | ICD-10-CM | POA: Diagnosis not present

## 2021-06-26 DIAGNOSIS — I639 Cerebral infarction, unspecified: Secondary | ICD-10-CM | POA: Diagnosis not present

## 2021-06-26 DIAGNOSIS — E7849 Other hyperlipidemia: Secondary | ICD-10-CM | POA: Diagnosis not present

## 2021-06-26 DIAGNOSIS — E113393 Type 2 diabetes mellitus with moderate nonproliferative diabetic retinopathy without macular edema, bilateral: Secondary | ICD-10-CM

## 2021-06-26 MED ORDER — ATORVASTATIN CALCIUM 80 MG PO TABS
80.0000 mg | ORAL_TABLET | Freq: Every day | ORAL | 0 refills | Status: DC
  Filled 2021-06-26: qty 120, 120d supply, fill #0
  Filled 2021-06-27: qty 90, 90d supply, fill #0

## 2021-06-26 MED ORDER — JANUMET XR 100-1000 MG PO TB24
1.0000 | ORAL_TABLET | Freq: Every day | ORAL | 2 refills | Status: DC
  Filled 2021-06-26: qty 120, 120d supply, fill #0

## 2021-06-26 MED ORDER — LEVEMIR FLEXPEN 100 UNIT/ML ~~LOC~~ SOPN
8.0000 [IU] | PEN_INJECTOR | Freq: Every day | SUBCUTANEOUS | 0 refills | Status: DC
  Filled 2021-06-26: qty 6, 75d supply, fill #0

## 2021-06-26 MED ORDER — ASPIRIN 81 MG PO TBEC
81.0000 mg | DELAYED_RELEASE_TABLET | Freq: Every day | ORAL | 1 refills | Status: DC
  Filled 2021-06-26: qty 90, 90d supply, fill #0

## 2021-06-26 NOTE — Patient Instructions (Signed)
Mr.Jobin Micheal Likens, it was a pleasure seeing you today! ? ?Today we discussed: ? ?Constipation: Pick up Miralax and Senokot. These can be purchased over the counter and do not require a prescription ? ?Diabetes: Your blood sugars look wonderful! Continue taking your current medications.  ? ? ? ?Follow-up: 3 months  ? ?Please make sure to arrive 15 minutes prior to your next appointment. If you arrive late, you may be asked to reschedule.  ? ?We look forward to seeing you next time. Please call our clinic at [phone] if you have any questions or concerns. The best time to call is Monday-Friday from 9am-4pm, but there is someone available 24/7. If after hours or the weekend, call the main hospital number and ask for the Internal Medicine Resident On-Call. If you need medication refills, please notify your pharmacy one week in advance and they will send [place] a request. ? ?Thank you for letting us take part in your care. Wishing you the best! ? ? ?

## 2021-06-27 ENCOUNTER — Other Ambulatory Visit (HOSPITAL_COMMUNITY): Payer: Self-pay

## 2021-06-27 ENCOUNTER — Telehealth: Payer: Self-pay

## 2021-06-27 NOTE — Progress Notes (Signed)
? ?  Office Visit ? ? Patient ID: Arthur Howard, male    DOB: 10/03/1965, 56 y.o.   MRN: [id number]   PCP: Virl Axe, MD  ? ?Subjective:  ?CC: Follow-up (MEDICATION REFILL X 4 MONTH (GOING OUT OF COUNTRY 07-03-2021) DM) and Diabetes  ? ?Arthur Howard is a 56 y.o. year old male who presents for the above medical condition(s). Please refer to problem based charting for assessment and plan. ?Note that he elected to utilize his daughter for translation. She was present throughout the entirety of today's visit.  ? ? ? ?Objective:  ? ?BP 121/70 (BP Location: Left Arm, Patient Position: Sitting, Cuff Size: Normal)   Pulse 72   Temp 98.1 ?F (36.7 ?C) (Oral)   Ht '5\' 4"'$  (1.626 m)   Wt 149 lb 6.4 oz (67.8 kg)   SpO2 96%   BMI 25.64 kg/m?  ? ?General: well appearing male ?Cardiac: RRR ?Pulm: lungs clear throughout.  ?Neuro: 4/5 strength of the RUE. Normal gait.  ?Assessment & Plan:  ? ?Problem List Items Addressed This Visit   ? ?  ? Cardiovascular and Mediastinum  ? Left pontine cerebrovascular accident [facility])  ?  He has mild residual RUE weakness. Blood pressure, diabetes, and cholesterol are well controlled. Reports adherence to medications ?-continue aspirin and statin therapy ?  ?  ? Relevant Medications  ? aspirin 81 MG EC tablet  ? atorvastatin (LIPITOR) 80 MG tablet  ? 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 moderate nonproliferative retinopathy without macular edema, with long-term current use of insulin ([place])  ?  Current medications: levemir 8U daily, Janumet 100-'1000mg'$  daily ?Secondary agents: lipitor '80mg'$  daily ? ?Log book for glucose values reviewed. He is checking his blood sugar 2-3x daily. Glucose values are running in the 110s-120s, no hypoglyemic events.  ? ?Plan ?-continue current management ?-It is still too soon for an A1C recheck. He will be going to [place] next week for about 4 months so he will follow up with his PCP after he returns. 4 month supply of his medications were sent to  the pharmacy. He reports having sufficient diabetic supplies. ?-deferred discussion about starting GLP-1 at today's visit due to him going out of the country next week and inability to monitor sx.  ?  ?  ? Relevant Medications  ? aspirin 81 MG EC tablet  ? atorvastatin (LIPITOR) 80 MG tablet  ? SitaGLIPtin-MetFORMIN HCl (JANUMET XR) [id number] MG TB24  ? insulin detemir (LEVEMIR FLEXPEN) 100 UNIT/ML FlexPen  ? ? ?Return in about 3 months (around 09/25/2021) for Diabetes follow up. ? ? ?Pt discussed with Dr. Cain Sieve ? ?Mitzi Hansen, MD ?Internal Medicine Resident PGY-3 ?[facility] ?06/27/2021 2:31 PM  ?  ?

## 2021-06-27 NOTE — Telephone Encounter (Signed)
atorvastatin (LIPITOR) 80 MG tablet, is not at the pharmacy. Pt is using [facility] outpatient pharmacy.  ?

## 2021-06-27 NOTE — Telephone Encounter (Signed)
I called Arthur Howard who stated it's 26 days too early to fill and insurance to pay. Then she ran rx thru their system, insurance will pay so she will get it ready for the pt. ?Called pt's daughter to inform her of refill.  She's asking about recent lab results; stated they missed the doctor's call - I informed her I will have pt's doctor to call her. ?

## 2021-06-27 NOTE — Assessment & Plan Note (Signed)
Current medications: levemir 8U daily, Janumet 100-'1000mg'$  daily ?Secondary agents: lipitor '80mg'$  daily ? ?Log book for glucose values reviewed. He is checking his blood sugar 2-3x daily. Glucose values are running in the 110s-120s, no hypoglyemic events.  ? ?Plan ?-continue current management ?-It is still too soon for an A1C recheck. He will be going to [place] next week for about 4 months so he will follow up with his PCP after he returns. 4 month supply of his medications were sent to the pharmacy. He reports having sufficient diabetic supplies. ?-deferred discussion about starting GLP-1 at today's visit due to him going out of the country next week and inability to monitor sx.  ?

## 2021-06-27 NOTE — Assessment & Plan Note (Signed)
He has mild residual RUE weakness. Blood pressure, diabetes, and cholesterol are well controlled. Reports adherence to medications ?-continue aspirin and statin therapy ?

## 2021-06-28 ENCOUNTER — Other Ambulatory Visit: Payer: Self-pay | Admitting: Internal Medicine

## 2021-06-28 ENCOUNTER — Other Ambulatory Visit (HOSPITAL_COMMUNITY): Payer: Self-pay

## 2021-06-28 NOTE — Progress Notes (Signed)
Internal Medicine Clinic Attending  Case discussed with Dr. Christian  At the time of the visit.  We reviewed the resident's history and exam and pertinent patient test results.  I agree with the assessment, diagnosis, and plan of care documented in the resident's note.  

## 2021-07-01 ENCOUNTER — Other Ambulatory Visit (HOSPITAL_COMMUNITY): Payer: Self-pay

## 2021-07-01 MED ORDER — IBUPROFEN 600 MG PO TABS
600.0000 mg | ORAL_TABLET | Freq: Four times a day (QID) | ORAL | 0 refills | Status: DC | PRN
  Filled 2021-07-01: qty 10, 3d supply, fill #0

## 2021-07-01 MED ORDER — AMOXICILLIN 500 MG PO CAPS
500.0000 mg | ORAL_CAPSULE | Freq: Three times a day (TID) | ORAL | 0 refills | Status: DC
  Filled 2021-07-01: qty 25, 9d supply, fill #0

## 2021-07-02 ENCOUNTER — Other Ambulatory Visit (HOSPITAL_COMMUNITY): Payer: Self-pay

## 2021-07-04 ENCOUNTER — Other Ambulatory Visit (HOSPITAL_COMMUNITY): Payer: Self-pay

## 2021-07-04 MED FILL — Insulin Pen Needle 32 G X 4 MM (1/6" or 5/32"): 100 days supply | Qty: 100 | Fill #0 | Status: AC

## 2021-07-05 ENCOUNTER — Other Ambulatory Visit (HOSPITAL_COMMUNITY): Payer: Self-pay

## 2021-07-24 ENCOUNTER — Encounter: Payer: Medicare Other | Admitting: Internal Medicine

## 2022-01-01 ENCOUNTER — Encounter: Payer: Self-pay | Admitting: Dietician

## 2022-03-03 ENCOUNTER — Encounter: Payer: Self-pay | Admitting: Dietician

## 2022-03-03 ENCOUNTER — Ambulatory Visit (INDEPENDENT_AMBULATORY_CARE_PROVIDER_SITE_OTHER): Payer: Medicare HMO | Admitting: Internal Medicine

## 2022-03-03 ENCOUNTER — Other Ambulatory Visit (HOSPITAL_COMMUNITY): Payer: Self-pay

## 2022-03-03 ENCOUNTER — Other Ambulatory Visit: Payer: Self-pay

## 2022-03-03 VITALS — BP 132/61 | HR 58 | Temp 97.7°F | Ht 64.0 in | Wt 150.4 lb

## 2022-03-03 DIAGNOSIS — Z794 Long term (current) use of insulin: Secondary | ICD-10-CM

## 2022-03-03 DIAGNOSIS — E7849 Other hyperlipidemia: Secondary | ICD-10-CM

## 2022-03-03 DIAGNOSIS — Z23 Encounter for immunization: Secondary | ICD-10-CM | POA: Diagnosis not present

## 2022-03-03 DIAGNOSIS — E1165 Type 2 diabetes mellitus with hyperglycemia: Secondary | ICD-10-CM

## 2022-03-03 DIAGNOSIS — Z0000 Encounter for general adult medical examination without abnormal findings: Secondary | ICD-10-CM

## 2022-03-03 DIAGNOSIS — E113393 Type 2 diabetes mellitus with moderate nonproliferative diabetic retinopathy without macular edema, bilateral: Secondary | ICD-10-CM | POA: Diagnosis not present

## 2022-03-03 LAB — POCT GLYCOSYLATED HEMOGLOBIN (HGB A1C): Hemoglobin A1C: 7.3 % — AB (ref 4.0–5.6)

## 2022-03-03 LAB — GLUCOSE, CAPILLARY: Glucose-Capillary: 229 mg/dL — ABNORMAL HIGH (ref 70–99)

## 2022-03-03 MED ORDER — EMPAGLIFLOZIN 10 MG PO TABS
10.0000 mg | ORAL_TABLET | Freq: Every day | ORAL | 3 refills | Status: DC
Start: 2022-03-03 — End: 2022-06-04
  Filled 2022-03-03: qty 30, 30d supply, fill #0
  Filled 2022-03-30: qty 30, 30d supply, fill #1
  Filled 2022-05-05: qty 30, 30d supply, fill #2

## 2022-03-03 MED ORDER — ATORVASTATIN CALCIUM 80 MG PO TABS
80.0000 mg | ORAL_TABLET | Freq: Every day | ORAL | 3 refills | Status: DC
  Filled 2022-03-03: qty 90, 90d supply, fill #0

## 2022-03-03 MED ORDER — JANUMET XR 100-1000 MG PO TB24
1.0000 | ORAL_TABLET | Freq: Every day | ORAL | 3 refills | Status: DC
  Filled 2022-03-03: qty 90, 90d supply, fill #0

## 2022-03-03 NOTE — Assessment & Plan Note (Signed)
Flu shot today 

## 2022-03-03 NOTE — Progress Notes (Signed)
CC: diabetes  HPI:  Arthur Howard is a 56 y.o. male living with a history stated below and presents today for a follow up of his diabetes. He is accompanied by his daughter, who assisted with translating. Please see problem based assessment and plan for additional details.  Past Medical History:  Diagnosis Date   Burning with urination 08/06/2020   Diabetes mellitus without complication ([place]) 6789   Hyperlipidemia    Stroke ([place]) 05/06/2018   R sided weakness    Current Outpatient Medications on File Prior to Visit  Medication Sig Dispense Refill   Accu-Chek Softclix Lancets lancets Use as instructed to test blood sugar 4 times daily 100 each 12   aspirin 81 MG EC tablet Take 1 tablet (81 mg total) by mouth daily. 120 tablet 1   Blood Glucose Monitoring Suppl (ACCU-CHEK GUIDE ME) w/Device KIT Use with test strips. 1 kit 0   Blood Glucose Monitoring Suppl (BLOOD GLUCOSE MONITOR SYSTEM) w/Device KIT Use as directed up to 4 times daily 1 kit 0   glucose blood (ACCU-CHEK GUIDE) test strip Use with blood glucose monitoring device. Test 4 times daily 100 each 12   ibuprofen (ADVIL) 600 MG tablet Take 1 tablet (600 mg total) by mouth every 6-8 hours as needed for pain or discomfort 10 tablet 0   [DISCONTINUED] blood glucose meter kit and supplies KIT Dispense based on patient and insurance preference. Use up to four times daily as directed. (FOR ICD-9 250.00, 250.01). (Patient not taking: Reported on 01/25/2020) 1 each 0   [DISCONTINUED] omega-3 acid ethyl esters (LOVAZA) 1 g capsule Take 1 capsule (1 g total) by mouth daily. 90 capsule 3   No current facility-administered medications on file prior to visit.    Family History  Problem Relation Age of Onset   Heart disease Mother    Lung cancer Father    Diabetes Sister    Diabetes Brother    CVA Neg Hx    Colon cancer Neg Hx    Colon polyps Neg Hx    Esophageal cancer Neg Hx    Rectal cancer Neg Hx    Stomach cancer Neg Hx      Social History   Socioeconomic History   Marital status: Married    Spouse name: Arthur Howard   Number of children: 7   Years of education: Not on file   Highest education level: 6th grade  Occupational History   Occupation: Sales executive    Comment: [employer]  Tobacco Use   Smoking status: Former    Types: Cigarettes   Smokeless tobacco: Never   Tobacco comments:    never a daily smoker  Vaping Use   Vaping Use: Never used  Substance and Sexual Activity   Alcohol use: No    Alcohol/week: 0.0 standard drinks of alcohol   Drug use: No   Sexual activity: Not on file  Other Topics Concern   Not on file  Social History Narrative   Works with Sales executive. Lives in [place]. Moved from [place] 35 years ago. He is married. Has 7 children (all girls).       Patient is right-handed. He lives with his wife in a one level home. He walks daily for exercise.   Social Determinants of Health   Financial Resource Strain: Not on file  Food Insecurity: Not on file  Transportation Needs: Not on file  Physical Activity: Not on file  Stress: Not on file  Social Connections: Not on file  Intimate Partner Violence: Not on file    Review of Systems: ROS negative except for what is noted on the assessment and plan.  Vitals:   03/03/22 1546  BP: 132/61  Pulse: (!) 58  Temp: 97.7 F (36.5 C)  TempSrc: Oral  SpO2: 99%  Weight: 150 lb 6.4 oz (68.2 kg)  Height: _0  (1.626 m)    Physical Exam: Constitutional: well-appearing male sitting in chair, in no acute distress Cardiovascular: slightly bradycardic rate and regular rhythm, no m/r/g Pulmonary/Chest: normal work of breathing on room air, lungs clear to auscultation bilaterally Abdominal: soft, non-tender, non-distended MSK: normal bulk and tone Neurological: alert & oriented x 3, no focal deficit Skin: warm and dry Psych: normal mood and behavior  Assessment & Plan:    Patient discussed with Dr. Philipp Ovens  Type 2 diabetes mellitus  with both eyes affected by moderate nonproliferative retinopathy without macular edema, with long-term current use of insulin ([place]) The patient presents to the [facility] to follow-up on his diabetes.  He is taking Janumet [phone] mg daily, and was previously taking Levemir 8 units daily, but has been off of this for over 2 months.  Patient states that he stopped injecting insulin while he was in [place], as he was getting hypoglycemic with blood sugars as low as the 60s to 70s.  Since being on Janumet, the patient states that his morning blood sugars have been in the 140s on average, but have been as low as the 90s.  His A1c is improved from 8.3% to 7.3% today.  Overall, the patient feels very well, and denies any polyuria, polydipsia, and vision changes, or any other symptoms at this time.  Plan: -Continue Janumet [phone] mg daily -Start Jardiance 10 mg daily -Urine micro today (starting SGLT2 inhibitor today, but if elevated, could also start ARB at next office visit) -Diabetic eye exam in February -Follow-up in 3 months  Healthcare maintenance - Flu shot today   Amit Leece, D.O. [facility], PGY-2 Phone: [phone] Date 03/03/2022 Time 4:10 PM

## 2022-03-03 NOTE — Patient Instructions (Signed)
Arthur Howard, Arthur Howard por permitirnos brindarle atencin hoy. hoy discutimos:  Diabetes: siga tomando [employer]. Tambin estamos comenzando a tomar un nuevo medicamento llamado [place], para que usted tambin lo tome [employer]. Este medicamento no Estate manager/land agent que su nivel de Location manager en la sangre baje demasiado como lo hizo la insulina.  He ordenado los siguientes laboratorios para usted:   Lab Orders         Microalbumin / Creatinine Urine Ratio         Glucose, capillary         POC Hbg A1C      Pruebas ordenadas hoy:  Referencias ordenadas hoy:  Referral Orders  No referral(s) requested today     He ordenado el siguiente medicamento/cambiado los siguientes medicamentos:  Suspender los siguientes medicamentos: Medications Discontinued During This Encounter  Medication Reason   amoxicillin (AMOXIL) 500 MG capsule    insulin detemir (LEVEMIR FLEXPEN) 100 UNIT/ML FlexPen    Insulin Pen Needle (UNIFINE PENTIPS) 32G X 4 MM MISC    atorvastatin (LIPITOR) 80 MG tablet Reorder   SitaGLIPtin-MetFORMIN HCl (JANUMET XR) [id number] MG TB24 Reorder     Iniciar los siguientes medicamentos: Meds ordered this encounter  Medications   atorvastatin (LIPITOR) 80 MG tablet    Sig: Take 1 tablet (80 mg total) by mouth daily.    Dispense:  90 tablet    Refill:  3   SitaGLIPtin-MetFORMIN HCl (JANUMET XR) [id number] MG TB24    Sig: Take 1 tablet by mouth daily.    Dispense:  90 tablet    Refill:  3   empagliflozin (JARDIANCE) 10 MG TABS tablet    Sig: Take 1 tablet (10 mg total) by mouth daily before breakfast.    Dispense:  30 tablet    Refill:  3     Seguimiento 3 months    Si tiene [place] pregunta o inquietud, llame a la clnica de medicina interna al [phone].     Buddy Duty, D.O. [place]

## 2022-03-03 NOTE — Assessment & Plan Note (Addendum)
The patient presents to the [facility] to follow-up on his diabetes.  He is taking Janumet [phone] mg daily, and was previously taking Levemir 8 units daily, but has been off of this for over 2 months.  Patient states that he stopped injecting insulin while he was in [place], as he was getting hypoglycemic with blood sugars as low as the 60s to 70s.  Since being on Janumet, the patient states that his morning blood sugars have been in the 140s on average, but have been as low as the 90s.  His A1c is improved from 8.3% to 7.3% today.  Overall, the patient feels very well, and denies any polyuria, polydipsia, and vision changes, or any other symptoms at this time.  Plan: -Continue Janumet [phone] mg daily -Start Jardiance 10 mg daily -Urine micro today (starting SGLT2 inhibitor today, but if elevated, could also start ARB at next office visit) -Diabetic eye exam in February -Follow-up in 3 months

## 2022-03-04 LAB — MICROALBUMIN / CREATININE URINE RATIO
Creatinine, Urine: 111.2 mg/dL
Microalb/Creat Ratio: <3 mg/g creat (ref 0–29)
Microalbumin, Urine: <3 ug/mL

## 2022-03-05 NOTE — Progress Notes (Signed)
Microalbumin/creatinine ratio normal.

## 2022-03-06 NOTE — Addendum Note (Signed)
Addended by: Jodean Lima on: 03/06/2022 01:43 PM   Modules accepted: Level of Service

## 2022-03-06 NOTE — Progress Notes (Signed)
Internal Medicine Clinic Attending ° °Case discussed with Dr. Atway  At the time of the visit.  We reviewed the resident’s history and exam and pertinent patient test results.  I agree with the assessment, diagnosis, and plan of care documented in the resident’s note.  °

## 2022-03-10 ENCOUNTER — Other Ambulatory Visit: Payer: Self-pay | Admitting: Student

## 2022-03-10 ENCOUNTER — Other Ambulatory Visit (HOSPITAL_COMMUNITY): Payer: Self-pay

## 2022-03-10 MED ORDER — ACCU-CHEK SOFTCLIX LANCETS MISC
Freq: Four times a day (QID) | 12 refills | Status: AC
  Filled 2022-03-10: qty 100, 25d supply, fill #0
  Filled 2022-03-30: qty 100, 25d supply, fill #1
  Filled 2022-05-05: qty 100, 25d supply, fill #2
  Filled 2022-06-30: qty 100, 25d supply, fill #3
  Filled 2022-07-08 – 2022-07-28 (×2): qty 100, 25d supply, fill #4
  Filled 2022-08-11 – 2022-08-22 (×2): qty 100, 25d supply, fill #5
  Filled 2022-10-24: qty 100, 25d supply, fill #6
  Filled 2022-11-17: qty 100, 25d supply, fill #7

## 2022-03-10 MED ORDER — ACCU-CHEK GUIDE VI STRP
ORAL_STRIP | Freq: Four times a day (QID) | 12 refills | Status: AC
  Filled 2022-03-10: qty 100, 25d supply, fill #0
  Filled 2022-03-30: qty 100, 25d supply, fill #1
  Filled 2022-05-05: qty 100, 25d supply, fill #2
  Filled 2022-06-30: qty 100, 25d supply, fill #3
  Filled 2022-07-08 – 2022-07-28 (×2): qty 100, 25d supply, fill #4
  Filled 2022-08-11 – 2022-08-22 (×2): qty 100, 25d supply, fill #5
  Filled 2022-10-24: qty 100, 25d supply, fill #6
  Filled 2022-11-17: qty 100, 25d supply, fill #7

## 2022-03-10 MED ORDER — ACCU-CHEK GUIDE ME W/DEVICE KIT
PACK | 0 refills | Status: DC
  Filled 2022-03-10: qty 1, 1d supply, fill #0

## 2022-03-10 NOTE — Telephone Encounter (Signed)
Pt's daughter states the patient has lost his Meter and would like a new one called in.  Accu-Chek Softclix Lancets lancets   Blood Glucose Monitoring Suppl (ACCU-CHEK GUIDE ME) w/Device KIT   glucose blood (ACCU-CHEK GUIDE) test strip   [facility]

## 2022-04-02 ENCOUNTER — Ambulatory Visit (INDEPENDENT_AMBULATORY_CARE_PROVIDER_SITE_OTHER): Payer: Medicare HMO | Admitting: Student

## 2022-04-02 VITALS — BP 135/70 | HR 64 | Temp 98.0°F | Wt 151.1 lb

## 2022-04-02 DIAGNOSIS — Z794 Long term (current) use of insulin: Secondary | ICD-10-CM | POA: Diagnosis not present

## 2022-04-02 DIAGNOSIS — R350 Frequency of micturition: Secondary | ICD-10-CM | POA: Diagnosis not present

## 2022-04-02 DIAGNOSIS — E113393 Type 2 diabetes mellitus with moderate nonproliferative diabetic retinopathy without macular edema, bilateral: Secondary | ICD-10-CM

## 2022-04-02 HISTORY — DX: Frequency of micturition: R35.0

## 2022-04-02 LAB — POCT URINALYSIS DIPSTICK
Bilirubin, UA: NEGATIVE
Blood, UA: NEGATIVE
Glucose, UA: POSITIVE — AB
Ketones, UA: NEGATIVE
Leukocytes, UA: NEGATIVE
Nitrite, UA: NEGATIVE
Protein, UA: NEGATIVE
Spec Grav, UA: 1.015 (ref 1.010–1.025)
Urobilinogen, UA: 0.2 E.U./dL
pH, UA: 5.5 (ref 5.0–8.0)

## 2022-04-02 LAB — GLUCOSE, CAPILLARY: Glucose-Capillary: 157 mg/dL — ABNORMAL HIGH (ref 70–99)

## 2022-04-02 NOTE — Addendum Note (Signed)
Addended by: Marcelino Duster on: 04/02/2022 04:44 PM   Modules accepted: Orders

## 2022-04-02 NOTE — Assessment & Plan Note (Addendum)
Patient presenting to the clinic for increased urinary frequency for the past 3 weeks. He states that he has been urinating more frequently over this time and is concerned for an infection. He would also like to have his prostate checked for abnormalities. He denies any urinary discharge, burning or pain with urination, urinary urgency, fevers, chills.   Of note, patient was started on Jardiance for better glycemic control about 1 month ago which coincides with symptom onset. Vania Rea is known to cause urinary frequency as a side effect and we discussed that this is the likely cause. Urine dipstick with glucosuria (to be expected on an SGLT-2i) but otherwise no infections or blood noted to suspect infectious etiology. His CBG is 157 in clinic today, but he has had lunch. DRE with normal prostate and no masses/nodules/irregularities noted.  We participated in shared discussion about risks/benefits of continuing Jardiance. I do think that the benefits outweigh the risks in this patient and he is also in agreement. Will continue Jardiance at current dosing. I have encouraged him to stay adequately hydrated as well. Will have him follow up in 2 months for repeat A1c to ensure it is at goal.   Plan: -continue jardiance '10mg'$  daily -adequate hydration

## 2022-04-02 NOTE — Assessment & Plan Note (Signed)
Patient with T2DM with retinopathy. Last A1c 7.3% about 1 month ago. No proteinuria noted. He is currently on Janumet 100-'1000mg'$  daily and started Jardiance '10mg'$  daily after last office visit. He does endorse increased urinary frequency with Jardiance (see "increased urinary frequency" problem for full details) but is tolerating it well and is agreeable to continuing Jardiance for glycemic control along with added CV and renal protective effects. Will have him follow up in 2 months for repeat A1c.  Plan: -continue current regimen -diabetic eye exam in February 2024 -f/u in 2 months for repeat A1c

## 2022-04-02 NOTE — Patient Instructions (Signed)
Arthur Howard,  It was a pleasure seeing you in the clinic today.   I think that your increased urination is from the new medicine we started called Jardiance to help with your sugar levels. Please make sure to drink plenty of water at home (about 2 liters a day) to make sure you stay well hydrated. You do not have a urinary tract infection. Please follow up in 3 months for routine follow up.   Please call our clinic at [phone] if you have any questions or concerns. The best time to call is Monday-Friday from 9am-4pm, but there is someone available 24/7 at the same number. If you need medication refills, please notify your pharmacy one week in advance and they will send [place] a request.   Thank you for letting us take part in your care. We look forward to seeing you next time!

## 2022-04-02 NOTE — Progress Notes (Signed)
   CC: increased urinary frequency  HPI:  [name] is a 57 y.o. male with history listed below presenting to the [facility] for increased urinary frequency. Please see individualized problem based charting for full HPI.  Past Medical History:  Diagnosis Date   Burning with urination 08/06/2020   Diabetes mellitus without complication ([place]) 7510   Hyperlipidemia    Stroke (Arthur Howard) 05/06/2018   R sided weakness    Review of Systems:  Negative aside from that listed in individualized problem based charting.  Physical Exam:  Vitals:   04/02/22 1425  BP: 135/70  Pulse: 64  Temp: 98 F (36.7 C)  TempSrc: Oral  SpO2: 98%  Weight: 151 lb 1.6 oz (68.5 kg)   Physical Exam Constitutional:      Appearance: Normal appearance. He is not ill-appearing.  HENT:     Mouth/Throat:     Mouth: Mucous membranes are moist.     Pharynx: Oropharynx is clear.  Eyes:     Extraocular Movements: Extraocular movements intact.     Pupils: Pupils are equal, round, and reactive to light.  Cardiovascular:     Rate and Rhythm: Normal rate and regular rhythm.     Pulses: Normal pulses.     Heart sounds: Normal heart sounds. No murmur heard.    No friction rub. No gallop.  Pulmonary:     Effort: Pulmonary effort is normal.     Breath sounds: Normal breath sounds. No wheezing, rhonchi or rales.  Abdominal:     General: Bowel sounds are normal. There is no distension.     Palpations: Abdomen is soft.     Tenderness: There is no abdominal tenderness. There is no guarding or rebound.  Genitourinary:    Comments: DRE with normal prostate, no nodules/masses/irregularities noted. Musculoskeletal:        General: No swelling. Normal range of motion.  Skin:    General: Skin is warm and dry.  Neurological:     Mental Status: He is alert and oriented to person, place, and time. Mental status is at baseline.  Psychiatric:        Mood and Affect: Mood normal.        Behavior: Behavior normal.       Assessment & Plan:   See Encounters Tab for problem based charting.  Patient discussed with Dr. Philipp Ovens

## 2022-04-03 NOTE — Progress Notes (Signed)
Internal Medicine Clinic Attending  Case discussed with Dr. Jinwala  At the time of the visit.  We reviewed the resident's history and exam and pertinent patient test results.  I agree with the assessment, diagnosis, and plan of care documented in the resident's note.  

## 2022-04-03 NOTE — Addendum Note (Signed)
Addended by: Jodean Lima on: 04/03/2022 09:30 AM   Modules accepted: Level of Service

## 2022-04-16 ENCOUNTER — Encounter: Payer: Self-pay | Admitting: Student

## 2022-04-16 ENCOUNTER — Ambulatory Visit (INDEPENDENT_AMBULATORY_CARE_PROVIDER_SITE_OTHER): Payer: Medicare HMO | Admitting: Student

## 2022-04-16 ENCOUNTER — Other Ambulatory Visit (HOSPITAL_COMMUNITY): Payer: Self-pay

## 2022-04-16 VITALS — BP 133/65 | HR 60 | Temp 97.9°F | Wt 149.7 lb

## 2022-04-16 DIAGNOSIS — R42 Dizziness and giddiness: Secondary | ICD-10-CM

## 2022-04-16 DIAGNOSIS — E1169 Type 2 diabetes mellitus with other specified complication: Secondary | ICD-10-CM | POA: Diagnosis not present

## 2022-04-16 DIAGNOSIS — E785 Hyperlipidemia, unspecified: Secondary | ICD-10-CM

## 2022-04-16 DIAGNOSIS — Z7984 Long term (current) use of oral hypoglycemic drugs: Secondary | ICD-10-CM

## 2022-04-16 DIAGNOSIS — Z794 Long term (current) use of insulin: Secondary | ICD-10-CM

## 2022-04-16 HISTORY — DX: Dizziness and giddiness: R42

## 2022-04-16 MED ORDER — ATORVASTATIN CALCIUM 40 MG PO TABS
40.0000 mg | ORAL_TABLET | Freq: Every day | ORAL | 1 refills | Status: DC
  Filled 2022-04-16: qty 90, 90d supply, fill #0

## 2022-04-16 NOTE — Progress Notes (Signed)
   CC: medication side effect  HPI:  [name] is a 57 y.o. male with history listed below presenting to the [facility] for medication side effect. Please see individualized problem based charting for full HPI.  Past Medical History:  Diagnosis Date   Burning with urination 08/06/2020   Diabetes mellitus without complication ([place]) 2841   Hyperlipidemia    Stroke ([place]) 05/06/2018   R sided weakness    Review of Systems:  Negative aside from that listed in individualized problem based charting.  Physical Exam:  Vitals:   04/16/22 1402 04/16/22 1414  BP: (!) 141/66 133/65  Pulse: (!) 58 60  Temp: 97.9 F (36.6 C)   TempSrc: Oral   SpO2: 100%   Weight: 149 lb 11.2 oz (67.9 kg)    Physical Exam Constitutional:      Appearance: Normal appearance. He is not ill-appearing.  HENT:     Mouth/Throat:     Mouth: Mucous membranes are moist.     Pharynx: Oropharynx is clear.  Eyes:     Extraocular Movements: Extraocular movements intact.     Conjunctiva/sclera: Conjunctivae normal.     Pupils: Pupils are equal, round, and reactive to light.  Cardiovascular:     Rate and Rhythm: Normal rate and regular rhythm.     Heart sounds: Normal heart sounds. No murmur heard.    No gallop.  Pulmonary:     Effort: Pulmonary effort is normal.     Breath sounds: Normal breath sounds. No wheezing, rhonchi or rales.  Abdominal:     General: Bowel sounds are normal. There is no distension.     Palpations: Abdomen is soft.     Tenderness: There is no abdominal tenderness.  Musculoskeletal:        General: No swelling. Normal range of motion.  Skin:    General: Skin is warm and dry.  Neurological:     General: No focal deficit present.     Mental Status: He is alert and oriented to person, place, and time.  Psychiatric:        Mood and Affect: Mood normal.        Behavior: Behavior normal.      Assessment & Plan:   See Encounters Tab for problem based charting.  Patient  discussed with Dr.  Saverio Danker

## 2022-04-16 NOTE — Assessment & Plan Note (Signed)
Decreased lipitor to '40mg'$  daily (please see "dizziness" tab). Will need repeat lipid panel at next visit in 6 weeks.

## 2022-04-16 NOTE — Patient Instructions (Signed)
Arthur Howard,  It was a pleasure seeing you in the clinic today.   I have sent a prescription with the reduced dose of lipitor to your pharmacy. Please take 1 tablet daily of the new dose ('40mg'$ ). If you feel any dizziness with the jardiance, please stop taking it and let us know. Please follow up in 6 weeks for your next visit.  Please call our clinic at [phone] if you have any questions or concerns. The best time to call is Monday-Friday from 9am-4pm, but there is someone available 24/7 at the same number. If you need medication refills, please notify your pharmacy one week in advance and they will send [place] a request.   Thank you for letting us take part in your care. We look forward to seeing you next time!

## 2022-04-16 NOTE — Assessment & Plan Note (Signed)
Patient reports experiencing dizziness with lipitor '80mg'$  daily. He states that for the past week, he notes worsening dizziness with lipitor. Dizziness is present with standing and at rest, and is more apparent in the afternoon (which is when he takes lipitor). He initially thought that it was from jardiance given recent increased urinary frequency, but he continued to experience dizziness after stopping jardiance for a few days. He specifically notes it to be more pronounced after taking lipitor. States that he experienced headaches in the past when taking lipitor '20mg'$  daily, but tolerated '40mg'$  dosing well. He has been on lipitor '80mg'$  daily for a couple of years now, so it is unusual to develop side effects now and dizziness is not a typical adverse effect of lipitor. Despite discussing this, patient is convinced that lipitor is the cause. States that he stopped taking the lipitor yesterday and his dizziness resolved. Given need for statin therapy for secondary prevention, we agreed to decrease the lipitor dose to '40mg'$  daily. Also advised patient to stop jardiance should he experience dizziness with this as Vania Rea is a more likely suspect. Also encouraged adequate hydration, which he states he has done since last visit. BP is at goal and he denies any orthostatic symptoms currently.  Plan: -decreased lipitor to '40mg'$  daily -lipid panel in 6 weeks

## 2022-04-16 NOTE — Assessment & Plan Note (Signed)
>>  ASSESSMENT AND PLAN FOR DIABETES MELLITUS ([facility]) WRITTEN ON 04/16/2022  5:28 PM BY JINWALA, SAGAR, MD  Decreased lipitor  to 40mg  daily (please see dizziness tab). Will need repeat lipid panel at next visit in 6 weeks.

## 2022-04-17 ENCOUNTER — Other Ambulatory Visit (HOSPITAL_COMMUNITY): Payer: Self-pay

## 2022-04-17 NOTE — Progress Notes (Signed)
Internal Medicine Clinic Attending  Case discussed with Dr. Jinwala  At the time of the visit.  We reviewed the resident's history and exam and pertinent patient test results.  I agree with the assessment, diagnosis, and plan of care documented in the resident's note.  

## 2022-04-17 NOTE — Addendum Note (Signed)
Addended by: Charise Killian on: 04/17/2022 09:09 AM   Modules accepted: Level of Service

## 2022-05-05 LAB — HM DIABETES EYE EXAM

## 2022-05-13 ENCOUNTER — Encounter: Payer: Self-pay | Admitting: Dietician

## 2022-06-04 ENCOUNTER — Encounter: Payer: Self-pay | Admitting: Student

## 2022-06-04 ENCOUNTER — Ambulatory Visit (INDEPENDENT_AMBULATORY_CARE_PROVIDER_SITE_OTHER): Payer: Medicare HMO | Admitting: Student

## 2022-06-04 ENCOUNTER — Other Ambulatory Visit (HOSPITAL_COMMUNITY): Payer: Self-pay

## 2022-06-04 VITALS — BP 132/74 | HR 77 | Temp 97.7°F | Ht 64.0 in | Wt 147.9 lb

## 2022-06-04 DIAGNOSIS — E785 Hyperlipidemia, unspecified: Secondary | ICD-10-CM | POA: Diagnosis not present

## 2022-06-04 DIAGNOSIS — Z794 Long term (current) use of insulin: Secondary | ICD-10-CM

## 2022-06-04 DIAGNOSIS — E1165 Type 2 diabetes mellitus with hyperglycemia: Secondary | ICD-10-CM | POA: Diagnosis not present

## 2022-06-04 DIAGNOSIS — E113393 Type 2 diabetes mellitus with moderate nonproliferative diabetic retinopathy without macular edema, bilateral: Secondary | ICD-10-CM

## 2022-06-04 DIAGNOSIS — E1169 Type 2 diabetes mellitus with other specified complication: Secondary | ICD-10-CM | POA: Diagnosis not present

## 2022-06-04 DIAGNOSIS — I6930 Unspecified sequelae of cerebral infarction: Secondary | ICD-10-CM

## 2022-06-04 LAB — POCT GLYCOSYLATED HEMOGLOBIN (HGB A1C): Hemoglobin A1C: 6.5 % — AB (ref 4.0–5.6)

## 2022-06-04 LAB — GLUCOSE, CAPILLARY: Glucose-Capillary: 170 mg/dL — ABNORMAL HIGH (ref 70–99)

## 2022-06-04 MED ORDER — ATORVASTATIN CALCIUM 40 MG PO TABS
40.0000 mg | ORAL_TABLET | Freq: Every day | ORAL | 1 refills | Status: DC
  Filled 2022-06-04 – 2022-06-30 (×2): qty 90, 90d supply, fill #0
  Filled 2022-07-02 – 2022-10-24 (×6): qty 90, 90d supply, fill #1

## 2022-06-04 MED ORDER — EMPAGLIFLOZIN 10 MG PO TABS
10.0000 mg | ORAL_TABLET | Freq: Every day | ORAL | 3 refills | Status: DC
  Filled 2022-06-04: qty 90, 90d supply, fill #0
  Filled 2022-06-30 – 2022-08-21 (×6): qty 90, 90d supply, fill #1
  Filled 2022-10-24 – 2023-03-04 (×2): qty 90, 90d supply, fill #2

## 2022-06-04 MED ORDER — JANUMET XR 100-1000 MG PO TB24
1.0000 | ORAL_TABLET | Freq: Every day | ORAL | 3 refills | Status: DC
  Filled 2022-06-04: qty 90, 90d supply, fill #0
  Filled 2022-06-30 – 2022-08-21 (×6): qty 90, 90d supply, fill #1
  Filled 2022-10-24 – 2023-03-04 (×2): qty 90, 90d supply, fill #2
  Filled 2023-05-30: qty 90, 90d supply, fill #3

## 2022-06-04 NOTE — Patient Instructions (Addendum)
[name],  Schriever un placer verte hoy en la clnica.   1. Su A1c se ve muy bien hoy, est en 6.5%. Sigan con [name] 2. He rellenado tu atorvastatina, jardiance y janumet para ti. 3. Llmenos 1 semana antes de ir a [place] para obtener resurtidos adicionales para que no se quede sin medicamentos all. 4. Hoy estamos revisando su nivel de colesterol y su funcin renal. Te llamar con los resultados una vez que los tenga. 5. Llame a nuestra clnica para programar una cita despus de regresar de [place] para que podamos verlo una vez que regrese.  Llame a nuestra clnica al [phone] si tiene alguna pregunta o inquietud. El mejor momento para llamar es de lunes a viernes de 9 a.m. a 4 p.m., pero hay alguien disponible las 24 horas del da, los 7 das de la semana en el mismo nmero. Si necesita resurtir [employer], notifique a su farmacia con una semana de anticipacin y nos enviarn una solicitud.   Gracias por permitirnos participar en su atencin. Esperamos verte la prxima!

## 2022-06-04 NOTE — Assessment & Plan Note (Addendum)
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 His A1c was 7.3% at last visit, down to 6.5% now. He has excellent glycemic control. Home glucometer readings are all within target range. He is tolerating both medications well. Had his eye exam last month and it was stable, next exam in 1 year. He is going to [place] next month for about 5 months to visit family and friends. Will send refill for medications now and he will need an additional refill closer to his trip so that he has sufficient amount to last him during his trip. He will call [facility] once he is back from his trip in order to get follow up visit and to repeat A1c at that time. We will check BMP today to assess GFR.  Plan: -continue janumet  and jardiance , both refilled today -f/u kidney function on BMP -f/u after returning from [place] for repeat A1c  ADDENDUM: Kidney function remains stable on BMP.   >>ASSESSMENT AND PLAN FOR DIABETES MELLITUS ([facility]) WRITTEN ON 06/05/2022  1:23 PM BY JINWALA, SAGAR, MD  He is tolerating the lipitor  40mg  daily dosing. No longer experiencing any dizziness. Will check a lipid panel today as it has been over 1 year since his last check (LDL was 56 at that time).   -f/u lipid panel -continue lipitor , refilled today  ADDENDUM: LDL remains at goal on current lipitor  dosing.

## 2022-06-04 NOTE — Progress Notes (Signed)
   CC: f/u T2DM, HLD  HPI:  Arthur Howard is a 57 y.o. male with history listed below presenting to the [facility] for f/u T2DM, HLD. Please see individualized problem based charting for full HPI.  Past Medical History:  Diagnosis Date   Burning with urination 08/06/2020   Diabetes mellitus without complication ([place]) 1308   Hyperlipidemia    Stroke ([place]) 05/06/2018   R sided weakness    Review of Systems:  Negative aside from that listed in individualized problem based charting.  Physical Exam:  Vitals:   06/04/22 1502  BP: 132/74  Pulse: 77  Temp: 97.7 F (36.5 C)  TempSrc: Oral  SpO2: 99%  Weight: 147 lb 14.4 oz (67.1 kg)  Height: 5\' 4"  (1.626 m)   Physical Exam Constitutional:      Appearance: Normal appearance. He is not ill-appearing.  HENT:     Mouth/Throat:     Mouth: Mucous membranes are moist.     Pharynx: Oropharynx is clear. No oropharyngeal exudate.  Eyes:     General: No scleral icterus.    Extraocular Movements: Extraocular movements intact.     Conjunctiva/sclera: Conjunctivae normal.     Pupils: Pupils are equal, round, and reactive to light.  Cardiovascular:     Rate and Rhythm: Normal rate and regular rhythm.     Pulses: Normal pulses.     Heart sounds: Normal heart sounds. No murmur heard.    No gallop.  Pulmonary:     Effort: Pulmonary effort is normal.     Breath sounds: Normal breath sounds. No wheezing, rhonchi or rales.  Abdominal:     General: Bowel sounds are normal. There is no distension.     Palpations: Abdomen is soft.     Tenderness: There is no abdominal tenderness. There is no guarding or rebound.  Musculoskeletal:        General: No swelling. Normal range of motion.  Skin:    General: Skin is warm and dry.  Neurological:     Mental Status: He is alert and oriented to person, place, and time. Mental status is at baseline.     Comments: Chronic right-sided weakness  Psychiatric:        Mood and Affect: Mood normal.         Behavior: Behavior normal.      Assessment & Plan:   See Encounters Tab for problem based charting.  Patient discussed with Dr.  Cain Sieve

## 2022-06-04 NOTE — Assessment & Plan Note (Signed)
He is tolerating the lipitor '40mg'$  daily dosing. No longer experiencing any dizziness. Will check a lipid panel today as it has been over 1 year since his last check (LDL was 56 at that time).   -f/u lipid panel -continue lipitor, refilled today  ADDENDUM: LDL remains at goal on current lipitor dosing.

## 2022-06-04 NOTE — Assessment & Plan Note (Addendum)
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 BP well controlled as is his diabetes. Checking lipid panel today. He is adherent with his lipitor and ASA.  -continue ASA and lipitor

## 2022-06-05 LAB — LIPID PANEL
Chol/HDL Ratio: 3.7 ratio (ref 0.0–5.0)
Cholesterol, Total: 133 mg/dL (ref 100–199)
HDL: 36 mg/dL — ABNORMAL LOW (ref >39)
LDL Chol Calc (NIH): 57 mg/dL (ref 0–99)
Triglycerides: 254 mg/dL — ABNORMAL HIGH (ref 0–149)
VLDL Cholesterol Cal: 40 mg/dL (ref 5–40)

## 2022-06-05 LAB — BMP8+ANION GAP
Anion Gap: 19 mmol/L — ABNORMAL HIGH (ref 10.0–18.0)
BUN/Creatinine Ratio: 17 (ref 9–20)
BUN: 16 mg/dL (ref 6–24)
CO2: 22 mmol/L (ref 20–29)
Calcium: 9.6 mg/dL (ref 8.7–10.2)
Chloride: 99 mmol/L (ref 96–106)
Creatinine, Ser: 0.92 mg/dL (ref 0.76–1.27)
Glucose: 147 mg/dL — ABNORMAL HIGH (ref 70–99)
Potassium: 4.7 mmol/L (ref 3.5–5.2)
Sodium: 140 mmol/L (ref 134–144)
eGFR: 96 mL/min/{1.73_m2} (ref >59)

## 2022-06-05 NOTE — Progress Notes (Signed)
Patient called with Spanish Interpretor.  Unable to reach patient, left voicemail. Will call back again at a later time. LDL remains at goal on lipitor '40mg'$  daily. Kidney function stable.

## 2022-06-09 NOTE — Progress Notes (Signed)
Internal Medicine Clinic Attending  Case discussed with Dr. Jinwala  At the time of the visit.  We reviewed the resident's history and exam and pertinent patient test results.  I agree with the assessment, diagnosis, and plan of care documented in the resident's note.  

## 2022-06-10 NOTE — Progress Notes (Signed)
Patient called with Spanish Interpretor, unable to reach again. Left voicemail to call [facility] for more information on lab results. As aforementioned, LDL at goal and kidney function stable.

## 2022-06-18 ENCOUNTER — Encounter: Payer: Medicare HMO | Admitting: Student

## 2022-06-24 ENCOUNTER — Telehealth: Payer: Self-pay

## 2022-06-24 ENCOUNTER — Encounter: Payer: Self-pay | Admitting: *Deleted

## 2022-06-24 NOTE — Telephone Encounter (Signed)
I sent pt a MyChart message.

## 2022-06-24 NOTE — Telephone Encounter (Signed)
Requesting lab results, please call pt back.  

## 2022-06-24 NOTE — Telephone Encounter (Signed)
---   Delivery failure of internet email alert  Tickler type: Message  Message Id(WMG): [id number]  SMTP Response: [id number]  Patient: Arthur Howard, Arthur Howard [id number])  Internet alert email: [email or web address]

## 2022-06-27 NOTE — Telephone Encounter (Signed)
Patient daughter called she stated she hasn't been getting any calls due to her phone being messed up. Patient stated her phone is now working and is requesting a call back @ [phone]

## 2022-06-30 ENCOUNTER — Other Ambulatory Visit (HOSPITAL_COMMUNITY): Payer: Self-pay

## 2022-06-30 ENCOUNTER — Other Ambulatory Visit: Payer: Self-pay

## 2022-06-30 ENCOUNTER — Other Ambulatory Visit: Payer: Self-pay | Admitting: Student

## 2022-06-30 MED ORDER — ACCU-CHEK GUIDE ME W/DEVICE KIT
PACK | 0 refills | Status: AC
  Filled 2022-06-30: qty 1, 30d supply, fill #0

## 2022-07-01 ENCOUNTER — Other Ambulatory Visit (HOSPITAL_COMMUNITY): Payer: Self-pay

## 2022-07-02 ENCOUNTER — Other Ambulatory Visit (HOSPITAL_COMMUNITY): Payer: Self-pay

## 2022-07-04 ENCOUNTER — Other Ambulatory Visit (HOSPITAL_COMMUNITY): Payer: Self-pay

## 2022-07-08 ENCOUNTER — Other Ambulatory Visit (HOSPITAL_COMMUNITY): Payer: Self-pay

## 2022-07-09 ENCOUNTER — Other Ambulatory Visit (HOSPITAL_COMMUNITY): Payer: Self-pay

## 2022-07-14 ENCOUNTER — Other Ambulatory Visit (HOSPITAL_COMMUNITY): Payer: Self-pay

## 2022-07-14 MED ORDER — POLYETHYLENE GLYCOL 3350 17 GM/SCOOP PO POWD
17.0000 g | Freq: Every day | ORAL | 0 refills | Status: DC
  Filled 2022-07-14: qty 238, 14d supply, fill #0

## 2022-07-14 MED ORDER — DOCUSATE SODIUM 100 MG PO CAPS: 100.0000 mg | ORAL_CAPSULE | Freq: Two times a day (BID) | ORAL | 0 refills | Status: DC

## 2022-07-15 ENCOUNTER — Other Ambulatory Visit (HOSPITAL_COMMUNITY): Payer: Self-pay

## 2022-07-28 ENCOUNTER — Other Ambulatory Visit: Payer: Self-pay

## 2022-07-28 ENCOUNTER — Other Ambulatory Visit: Payer: Self-pay | Admitting: Student

## 2022-07-29 ENCOUNTER — Other Ambulatory Visit (HOSPITAL_COMMUNITY): Payer: Self-pay

## 2022-08-04 ENCOUNTER — Other Ambulatory Visit (HOSPITAL_COMMUNITY): Payer: Self-pay

## 2022-08-11 ENCOUNTER — Other Ambulatory Visit: Payer: Self-pay | Admitting: Student

## 2022-08-12 ENCOUNTER — Other Ambulatory Visit: Payer: Self-pay

## 2022-08-12 ENCOUNTER — Other Ambulatory Visit (HOSPITAL_COMMUNITY): Payer: Self-pay

## 2022-08-21 ENCOUNTER — Other Ambulatory Visit (HOSPITAL_COMMUNITY): Payer: Self-pay

## 2022-08-22 ENCOUNTER — Other Ambulatory Visit: Payer: Self-pay | Admitting: Student

## 2022-08-22 ENCOUNTER — Other Ambulatory Visit: Payer: Self-pay

## 2022-08-22 ENCOUNTER — Other Ambulatory Visit (HOSPITAL_COMMUNITY): Payer: Self-pay

## 2022-08-25 ENCOUNTER — Encounter: Payer: Self-pay | Admitting: *Deleted

## 2022-10-24 ENCOUNTER — Other Ambulatory Visit (HOSPITAL_COMMUNITY): Payer: Self-pay

## 2022-10-27 ENCOUNTER — Other Ambulatory Visit: Payer: Self-pay

## 2022-11-17 ENCOUNTER — Other Ambulatory Visit (HOSPITAL_COMMUNITY): Payer: Self-pay

## 2023-03-02 ENCOUNTER — Encounter: Payer: Self-pay | Admitting: Student

## 2023-03-02 ENCOUNTER — Other Ambulatory Visit (HOSPITAL_COMMUNITY): Payer: Self-pay

## 2023-03-02 ENCOUNTER — Ambulatory Visit: Payer: Medicare HMO | Admitting: Student

## 2023-03-02 VITALS — BP 133/61 | HR 57 | Temp 97.7°F | Ht 61.0 in | Wt 144.6 lb

## 2023-03-02 DIAGNOSIS — Z794 Long term (current) use of insulin: Secondary | ICD-10-CM

## 2023-03-02 DIAGNOSIS — E1169 Type 2 diabetes mellitus with other specified complication: Secondary | ICD-10-CM

## 2023-03-02 DIAGNOSIS — I6930 Unspecified sequelae of cerebral infarction: Secondary | ICD-10-CM

## 2023-03-02 DIAGNOSIS — E113393 Type 2 diabetes mellitus with moderate nonproliferative diabetic retinopathy without macular edema, bilateral: Secondary | ICD-10-CM

## 2023-03-02 DIAGNOSIS — Z7984 Long term (current) use of oral hypoglycemic drugs: Secondary | ICD-10-CM | POA: Diagnosis not present

## 2023-03-02 DIAGNOSIS — I69359 Hemiplegia and hemiparesis following cerebral infarction affecting unspecified side: Secondary | ICD-10-CM

## 2023-03-02 LAB — POCT GLYCOSYLATED HEMOGLOBIN (HGB A1C): Hemoglobin A1C: 6.8 % — AB (ref 4.0–5.6)

## 2023-03-02 LAB — GLUCOSE, CAPILLARY: Glucose-Capillary: 179 mg/dL — ABNORMAL HIGH (ref 70–99)

## 2023-03-02 MED ORDER — ATORVASTATIN CALCIUM 40 MG PO TABS
40.0000 mg | ORAL_TABLET | Freq: Every day | ORAL | 1 refills | Status: DC
  Filled 2023-03-02: qty 90, 90d supply, fill #0
  Filled 2023-05-30: qty 90, 90d supply, fill #1

## 2023-03-02 NOTE — Assessment & Plan Note (Addendum)
Followed by Dr. Logan Bores, with [facility]. Last visit was in February 2024. Has an annual follow up visit scheduled for February 2025. He is reporting subjective worsening of his vision, especially when trying to read words at a distance.

## 2023-03-02 NOTE — Assessment & Plan Note (Addendum)
Patient with a history of T2DM. A1c is 6.8%, increased slightly from 6.5% in March 2024. He did not bring a glucometer today, says when he checks his fasting blood sugar at home it usually runs between 100-110. He is currently taking Jardiance 10 mg daily and Janumet [phone] mg daily with no concerns. He denies any new symptoms including polyuria, polydipsia, or peripheral neuropathy. He has had ~3 episodes of hypoglycemia over the past 6 months, during which he felt dizzy and checked his blood glucose which was in the 80s. Since he is at goal, we will not make any new medication changes at this time. We will check a BMP today and collect a urine microalbumin/creatinine.

## 2023-03-02 NOTE — Assessment & Plan Note (Addendum)
BP well-controlled today. He reports adherence with his atorvastatin and daily aspirin, which he will continue. He does report some occasional cramping, especially in his right leg, which is likely residual spasticity 2/2 his CVA, but could also be a side effect of the statin. We will continue to monitor it.

## 2023-03-02 NOTE — Patient Instructions (Addendum)
Gracias, Sr. Vctor Lenor Derrick, por permitirnos brindarle atencin [employer]. Hoy hablamos sobre su diabetes.  Su hemoglobina A1c fue de 6.8 %, ligeramente superior al 6.5 %. No realizaremos ningn cambio en sus medicamentos en este momento. Siga controlando sus niveles de azcar en sangre con regularidad y tomando sus medicamentos segn lo prescrito.  Hoy estamos controlando sus electrolitos y la funcin renal. Lo llamar tan pronto como tenga los [place].  Le he ordenado los siguientes anlisis de laboratorio:  rdenes de laboratorio Relacin microalbmina/creatinina en orina Glucosa capilar Brecha aninica BMP8+ Hbg A1C POC  Seguimiento: 2 meses  Esperamos verlo la prxima vez. Llame a nuestra clnica al [phone] si tiene alguna pregunta o inquietud. El mejor momento para llamar es de lunes a viernes de 9 a. m. a 4 p. m., pero hay alguien disponible las 24 horas, los 7 [address]  [address] de la [place]. Si es fuera del horario de atencin o durante el fin de [place], llame al nmero principal del hospital y pregunte por el residente de medicina interna de [place]. Si necesita reabastecimiento de medicamentos, notifique a su farmacia con una semana de anticipacin y nos enviarn una solicitud.  Gracias por confiarme su atencin. Le deseo lo mejor!  Annett Fabian, MD [facility]

## 2023-03-02 NOTE — Progress Notes (Signed)
CC: diabetes f/u  HPI:  [name] is a 57 y.o. male living with a history stated below and presents today for diabetes f/u. Please see problem based assessment and plan for additional details.  Past Medical History:  Diagnosis Date   Burning with urination 08/06/2020   Diabetes mellitus without complication (HCC) 2015   Dizziness 04/16/2022   Hyperlipidemia    Increased urinary frequency 04/02/2022   Stroke (HCC) 05/06/2018   R sided weakness    Current Outpatient Medications on File Prior to Visit  Medication Sig Dispense Refill   Accu-Chek Softclix Lancets lancets Use as directed 4 (four) times daily. 100 each 12   aspirin 81 MG EC tablet Take 1 tablet (81 mg total) by mouth daily. 120 tablet 1   Blood Glucose Monitoring Suppl (ACCU-CHEK GUIDE ME) w/Device KIT Use to test blood sugar with test strips. 1 kit 0   docusate sodium (COLACE) 100 MG capsule Take 1 capsule (100 mg total) by mouth 2 (two) times daily for 10 days. 20 capsule 0   empagliflozin (JARDIANCE) 10 MG TABS tablet Take 1 tablet (10 mg total) by mouth daily before breakfast. 90 tablet 3   glucose blood (ACCU-CHEK GUIDE) test strip Use to test blood glucose 4 (four) times daily. 100 each 12   polyethylene glycol powder (GLYCOLAX/MIRALAX) 17 GM/SCOOP powder Mix and drink  17 g by mouth daily. 238 g 0   SitaGLIPtin-MetFORMIN HCl (JANUMET XR) [phone] MG TB24 Take 1 tablet by mouth daily. 90 tablet 3   [DISCONTINUED] blood glucose meter kit and supplies KIT Dispense based on patient and insurance preference. Use up to four times daily as directed. (FOR ICD-9 250.00, 250.01). (Patient not taking: Reported on 01/25/2020) 1 each 0   [DISCONTINUED] omega-3 acid ethyl esters (LOVAZA) 1 g capsule Take 1 capsule (1 g total) by mouth daily. 90 capsule 3   No current facility-administered medications on file prior to visit.    Family History  Problem Relation Age of Onset   Heart disease Mother    Lung cancer  Father    Diabetes Sister    Diabetes Brother    CVA Neg Hx    Colon cancer Neg Hx    Colon polyps Neg Hx    Esophageal cancer Neg Hx    Rectal cancer Neg Hx    Stomach cancer Neg Hx     Social History   Socioeconomic History   Marital status: Married    Spouse name: Arthur Howard   Number of children: 7   Years of education: Not on file   Highest education level: 6th grade  Occupational History   Occupation: Network engineer    Comment: [employer]  Tobacco Use   Smoking status: Former    Types: Cigarettes   Smokeless tobacco: Never   Tobacco comments:    never a daily smoker  Vaping Use   Vaping status: Never Used  Substance and Sexual Activity   Alcohol use: No    Alcohol/week: 0.0 standard drinks of alcohol   Drug use: No   Sexual activity: Not on file  Other Topics Concern   Not on file  Social History Narrative   Works with Network engineer. Lives in [place]. Moved from [place] 35 years ago. He is married. Has 7 children (all girls).       Patient is right-handed. He lives with his wife in a one level home. He walks daily for exercise.   Social Determinants of Corporate investment banker  Strain: Not on file  Food Insecurity: No Food Insecurity (03/02/2023)   Hunger Vital Sign    Worried About Running Out of Food in the Last Year: Never true    Ran Out of Food in the Last Year: Never true  Transportation Needs: No Transportation Needs (03/02/2023)   PRAPARE - Administrator, Civil Service (Medical): No    Lack of Transportation (Non-Medical): No  Physical Activity: Not on file  Stress: Not on file  Social Connections: Not on file  Intimate Partner Violence: Not At Risk (03/02/2023)   Humiliation, Afraid, Rape, and Kick questionnaire    Fear of Current or Ex-Partner: No    Emotionally Abused: No    Physically Abused: No    Sexually Abused: No   Review of Systems: ROS negative except for what is noted on the assessment and plan.  Vitals:   03/02/23 1458  BP:  133/61  Pulse: (!) 57  Temp: 97.7 F (36.5 C)  TempSrc: Oral  SpO2: 98%  Weight: 144 lb 9.6 oz (65.6 kg)  Height: 5\' 1"  (1.549 m)   Physical Exam: Constitutional: well appearing HENT: unremarkable Eyes: unremarkable Cardiovascular: regular rate and rhythm, no m/r/g Pulmonary/Chest: CTA bilaterally, normal respiratory effort Abdominal: soft, non-tender, non-distended MSK: normal bulk and tone Neurological: alert & oriented x 3, no focal deficits Skin: warm and dry Psych: normal mood and behavior  Assessment & Plan:   Patient discussed with Dr. 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 A1c is 6.8%, increased slightly from 6.5% in March 2024. He did not bring a glucometer today, says when he checks his fasting blood sugar at home it usually runs between 100-110. He is currently taking Jardiance 10 mg daily and Janumet [phone] mg daily with no concerns. He denies any new symptoms including polyuria, polydipsia, or peripheral neuropathy. He has had ~3 episodes of hypoglycemia over the past 6 months, during which he felt dizzy and checked his blood glucose which was in the 80s. Since he is at goal, we will not make any new medication changes at this time. We will check a BMP today and collect a urine microalbumin/creatinine.   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 Logan Bores, with [facility]. Last visit was in February 2024. Has an annual follow up visit scheduled for February 2025. He is reporting subjective worsening of his vision, especially when trying to read words at a distance.   History of CVA with residual deficit BP well-controlled today. He reports adherence with his atorvastatin and daily aspirin,  which he will continue. He does report some occasional cramping, especially in his right leg, which is likely residual spasticity 2/2 his CVA, but could also be a side effect of the statin. We will continue to monitor it.  Annett Fabian, MD  [facility], PGY-1 Phone: [phone] Date 03/02/2023 Time 3:43 PM

## 2023-03-03 LAB — MICROALBUMIN / CREATININE URINE RATIO
Creatinine, Urine: 54.7 mg/dL
Microalb/Creat Ratio: <5 mg/g{creat} (ref 0–29)
Microalbumin, Urine: <3 ug/mL

## 2023-03-03 LAB — BMP8+ANION GAP
Anion Gap: 13 mmol/L (ref 10.0–18.0)
BUN/Creatinine Ratio: 23 — ABNORMAL HIGH (ref 9–20)
BUN: 25 mg/dL — ABNORMAL HIGH (ref 6–24)
CO2: 22 mmol/L (ref 20–29)
Calcium: 9.3 mg/dL (ref 8.7–10.2)
Chloride: 104 mmol/L (ref 96–106)
Creatinine, Ser: 1.09 mg/dL (ref 0.76–1.27)
Glucose: 172 mg/dL — ABNORMAL HIGH (ref 70–99)
Potassium: 4.4 mmol/L (ref 3.5–5.2)
Sodium: 139 mmol/L (ref 134–144)
eGFR: 78 mL/min/{1.73_m2} (ref >59)

## 2023-03-06 NOTE — Progress Notes (Signed)
Internal Medicine Clinic Attending  Case discussed with the resident at the time of the visit.  We reviewed the resident's history and exam and pertinent patient test results.  I agree with the assessment, diagnosis, and plan of care documented in the resident's note.  

## 2023-03-10 ENCOUNTER — Other Ambulatory Visit: Payer: Self-pay

## 2023-03-16 ENCOUNTER — Other Ambulatory Visit (HOSPITAL_COMMUNITY): Payer: Self-pay

## 2023-03-16 MED ORDER — AMOXICILLIN-POT CLAVULANATE 875-125 MG PO TABS
1.0000 | ORAL_TABLET | Freq: Two times a day (BID) | ORAL | 0 refills | Status: AC
  Filled 2023-03-16: qty 14, 7d supply, fill #0

## 2023-03-16 MED ORDER — CETIRIZINE HCL 10 MG PO TABS: 10.0000 mg | ORAL_TABLET | Freq: Every day | ORAL | 0 refills | Status: DC

## 2023-03-24 ENCOUNTER — Other Ambulatory Visit (HOSPITAL_COMMUNITY): Payer: Self-pay

## 2023-05-04 ENCOUNTER — Ambulatory Visit (INDEPENDENT_AMBULATORY_CARE_PROVIDER_SITE_OTHER): Payer: Medicare HMO | Admitting: Student

## 2023-05-04 ENCOUNTER — Encounter: Payer: Self-pay | Admitting: Student

## 2023-05-04 ENCOUNTER — Other Ambulatory Visit (HOSPITAL_COMMUNITY): Payer: Self-pay

## 2023-05-04 ENCOUNTER — Other Ambulatory Visit: Payer: Self-pay

## 2023-05-04 VITALS — BP 122/65 | HR 71 | Temp 97.9°F | Ht 62.0 in | Wt 145.7 lb

## 2023-05-04 DIAGNOSIS — Z7984 Long term (current) use of oral hypoglycemic drugs: Secondary | ICD-10-CM | POA: Diagnosis not present

## 2023-05-04 DIAGNOSIS — E113393 Type 2 diabetes mellitus with moderate nonproliferative diabetic retinopathy without macular edema, bilateral: Secondary | ICD-10-CM

## 2023-05-04 DIAGNOSIS — E119 Type 2 diabetes mellitus without complications: Secondary | ICD-10-CM | POA: Diagnosis not present

## 2023-05-04 DIAGNOSIS — Z794 Long term (current) use of insulin: Secondary | ICD-10-CM | POA: Diagnosis not present

## 2023-05-04 DIAGNOSIS — M545 Low back pain, unspecified: Secondary | ICD-10-CM | POA: Diagnosis not present

## 2023-05-04 DIAGNOSIS — M549 Dorsalgia, unspecified: Secondary | ICD-10-CM | POA: Insufficient documentation

## 2023-05-04 MED ORDER — METHOCARBAMOL 750 MG PO TABS
750.0000 mg | ORAL_TABLET | Freq: Four times a day (QID) | ORAL | 0 refills | Status: AC
  Filled 2023-05-04: qty 56, 14d supply, fill #0

## 2023-05-04 NOTE — Assessment & Plan Note (Signed)
 He reports acute lower back pain for the past few days.  He says the pain feels like a muscle strain and extends across his lower back.  It worsens with changes in position.  He has not taken anything for this.  He does not have any urinary incontinence, recent fevers or chills, focal tenderness over the lumbar spine.  He does not have any new neurological symptoms in his back or legs.  This is most likely a muscle strain for which I will prescribe him a Robaxin  to take 750 mg 4 times daily for pain relief and then as needed as needed for his back pain and muscle spasms secondary to his prior stroke.

## 2023-05-04 NOTE — Progress Notes (Signed)
 Internal Medicine Clinic Attending  Case discussed with the resident at the time of the visit.  We reviewed the resident's history and exam and pertinent patient test results.  I agree with the assessment, diagnosis, and plan of care documented in the resident's note.

## 2023-05-04 NOTE — Assessment & Plan Note (Signed)
 Last hemoglobin A1c was 6.8.  We will recheck this today.  He is currently taking Jardiance  10 mg and Janumet  [phone] mg daily.  He has no concerns with these medications.  He has an eye exam scheduled for February 16.  He denies any new polyuria, polydipsia, or neuropathy.  Recent microalbumin creatinine was within normal limits.  We will follow-up in 3 months.

## 2023-05-04 NOTE — Patient Instructions (Addendum)
 Arthur Howard, [name] por permitirnos brindar su atencin hoy. Hoy hablamos sobre su diabetes y Educational psychologist.  Volveremos a revisar su hemoglobina A1c hoy. Te llamar tan pronto como tenga [employer]. Mientras tanto, siga tomando su Jardiance  y Janumet  segn lo recetado.  Para el dolor lumbar y los espasmos musculares, le he recetado Robaxin , que es un relajante muscular. Tmelo 4 veces al da durante 1 semana y luego tmelo segn sea necesario para cualquier espasmo o dolor muscular.  Seguimiento: 3 meses  Esperamos verte la prxima vez. Llame a nuestra clnica al [phone] si tiene alguna pregunta o inquietud. El mejor momento para llamar es de lunes a viernes de 9 a.m. a 4 p.m., pero hay alguien disponible las 24 horas del da, los 7 [address]  [address] de la [place]. Si es fuera del horario de atencin o durante el fin de [place], llame al nmero principal del hospital y pregunte por el residente de medicina interna de [place]. Si necesita resurtir [employer], notifique a su farmacia con una semana de anticipacin y nos enviarn una solicitud.   Gracias por confiar en m con su cuidado.   Dorthy Gavia, MD

## 2023-05-04 NOTE — Progress Notes (Signed)
 CC: diabetes f/u  HPI:  [name] is a 58 y.o. male living with a history stated below and presents today for diabetes f/u. Please see problem based assessment and plan for additional details.  Daughter is present and assisted in translation.  Past Medical History:  Diagnosis Date   Burning with urination 08/06/2020   Diabetes mellitus without complication (HCC) 2015   Dizziness 04/16/2022   Hyperlipidemia    Increased urinary frequency 04/02/2022   Stroke (HCC) 05/06/2018   R sided weakness   Current Outpatient Medications on File Prior to Visit  Medication Sig Dispense Refill   Accu-Chek Softclix Lancets lancets Use as directed 4 (four) times daily. 100 each 12   aspirin  81 MG EC tablet Take 1 tablet (81 mg total) by mouth daily. 120 tablet 1   atorvastatin  (LIPITOR ) 40 MG tablet Take 1 tablet (40 mg total) by mouth daily. 90 tablet 1   Blood Glucose Monitoring Suppl (ACCU-CHEK GUIDE ME) w/Device KIT Use to test blood sugar with test strips. 1 kit 0   cetirizine  (ZYRTEC ) 10 MG tablet Take 1 tablet (10 mg total) by mouth daily. 30 tablet 0   docusate sodium  (COLACE) 100 MG capsule Take 1 capsule (100 mg total) by mouth 2 (two) times daily for 10 days. 20 capsule 0   empagliflozin  (JARDIANCE ) 10 MG TABS tablet Take 1 tablet (10 mg total) by mouth daily before breakfast. 90 tablet 3   glucose blood (ACCU-CHEK GUIDE) test strip Use to test blood glucose 4 (four) times daily. 100 each 12   polyethylene glycol powder (GLYCOLAX /MIRALAX ) 17 GM/SCOOP powder Mix and drink  17 g by mouth daily. 238 g 0   SitaGLIPtin -MetFORMIN  HCl (JANUMET  XR) [id number] MG TB24 Take 1 tablet by mouth daily. 90 tablet 3   [DISCONTINUED] blood glucose meter kit and supplies KIT Dispense based on patient and insurance preference. Use up to four times daily as directed. (FOR ICD-9 250.00, 250.01). (Patient not taking: Reported on 01/25/2020) 1 each 0   [DISCONTINUED] omega-3 acid ethyl esters (LOVAZA ) 1 g  capsule Take 1 capsule (1 g total) by mouth daily. 90 capsule 3   No current facility-administered medications on file prior to visit.    Family History  Problem Relation Age of Onset   Heart disease Mother    Lung cancer Father    Diabetes Sister    Diabetes Brother    CVA Neg Hx    Colon cancer Neg Hx    Colon polyps Neg Hx    Esophageal cancer Neg Hx    Rectal cancer Neg Hx    Stomach cancer Neg Hx     Social History   Socioeconomic History   Marital status: Married    Spouse name: Shelagh Howard   Number of children: 7   Years of education: Not on file   Highest education level: 6th grade  Occupational History   Occupation: Network engineer    Comment: [employer]  Tobacco Use   Smoking status: Former    Types: Cigarettes   Smokeless tobacco: Never   Tobacco comments:    never a daily smoker  Vaping Use   Vaping status: Never Used  Substance and Sexual Activity   Alcohol use: No    Alcohol/week: 0.0 standard drinks of alcohol   Drug use: No   Sexual activity: Not on file  Other Topics Concern   Not on file  Social History Narrative   Works with Network engineer. Lives in [place]. Moved from [place]  35 years ago. He is married. Has 7 children (all girls).       Patient is right-handed. He lives with his wife in a one level home. He walks daily for exercise.   Social Drivers of Corporate investment banker Strain: Not on file  Food Insecurity: No Food Insecurity (03/02/2023)   Hunger Vital Sign    Worried About Running Out of Food in the Last Year: Never true    Ran Out of Food in the Last Year: Never true  Transportation Needs: No Transportation Needs (03/02/2023)   PRAPARE - Administrator, Civil Service (Medical): No    Lack of Transportation (Non-Medical): No  Physical Activity: Not on file  Stress: Not on file  Social Connections: Not on file  Intimate Partner Violence: Not At Risk (03/02/2023)   Humiliation, Afraid, Rape, and Kick questionnaire    Fear of Current  or Ex-Partner: No    Emotionally Abused: No    Physically Abused: No    Sexually Abused: No    Review of Systems: ROS negative except for what is noted on the assessment and plan.  Vitals:   05/04/23 0953  BP: 122/65  Pulse: 71  Temp: 97.9 F (36.6 C)  TempSrc: Oral  SpO2: 99%  Weight: 145 lb 11.2 oz (66.1 kg)  Height: 5\' 2"  (1.575 m)    Physical Exam: Constitutional: well-appearing,in no acute distress Cardiovascular: regular rate and rhythm, no m/r/g Pulmonary/Chest: normal work of breathing on room air, lungs clear to auscultation bilaterally MSK: Tenderness to palpation in the lower back extending across the muscles on either side of the lumbar spine; no midline tenderness or concerning features; no bruising pain, erythema, or visible injuries Neurological: alert & oriented x 3, right-sided weakness from prior stroke, no neuropathy or new focal neurological deficits appreciated  Assessment & Plan:   Patient discussed with Dr. 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  We will recheck this today.  He is currently taking Jardiance  10 mg and Janumet  [phone] mg daily.  He has no concerns with these medications.  He has an eye exam scheduled for February 16.  He denies any new polyuria, polydipsia, or neuropathy.  Recent microalbumin creatinine was within normal limits.  We will follow-up in 3 months.  Musculoskeletal back pain He reports acute lower back pain for the past few days.  He says the pain feels like a muscle strain and extends across his lower back.  It worsens with changes in position.  He has not taken anything for this.  He does not have any urinary incontinence, recent fevers or chills, focal tenderness over the lumbar spine.  He does not have any new neurological symptoms in his back or legs.  This is most  likely a muscle strain for which I will prescribe him a Robaxin  to take 750 mg 4 times daily for pain relief and then as needed as needed for his back pain and muscle spasms secondary to his prior stroke.  Dorthy Gavia, MD [facility], PGY-1 Phone: [phone] Date 05/04/2023 Time 11:05 AM

## 2023-05-05 ENCOUNTER — Other Ambulatory Visit: Payer: Self-pay | Admitting: Student

## 2023-05-05 LAB — HEMOGLOBIN A1C
Est. average glucose Bld gHb Est-mCnc: 157 mg/dL
Hgb A1c MFr Bld: 7.1 % — ABNORMAL HIGH (ref 4.8–5.6)

## 2023-05-06 ENCOUNTER — Other Ambulatory Visit (HOSPITAL_COMMUNITY): Payer: Self-pay

## 2023-05-06 ENCOUNTER — Other Ambulatory Visit: Payer: Self-pay | Admitting: Student

## 2023-05-06 MED ORDER — EMPAGLIFLOZIN 25 MG PO TABS
25.0000 mg | ORAL_TABLET | Freq: Every day | ORAL | 2 refills | Status: DC
  Filled 2023-05-06: qty 30, 30d supply, fill #0

## 2023-05-07 ENCOUNTER — Encounter: Payer: Self-pay | Admitting: Internal Medicine

## 2023-05-08 ENCOUNTER — Encounter (HOSPITAL_COMMUNITY): Payer: Self-pay

## 2023-05-08 ENCOUNTER — Other Ambulatory Visit (HOSPITAL_COMMUNITY): Payer: Self-pay

## 2023-05-12 ENCOUNTER — Other Ambulatory Visit (HOSPITAL_COMMUNITY): Payer: Self-pay

## 2023-05-12 DIAGNOSIS — E113213 Type 2 diabetes mellitus with mild nonproliferative diabetic retinopathy with macular edema, bilateral: Secondary | ICD-10-CM | POA: Diagnosis not present

## 2023-05-12 DIAGNOSIS — H3563 Retinal hemorrhage, bilateral: Secondary | ICD-10-CM | POA: Diagnosis not present

## 2023-05-12 DIAGNOSIS — Z794 Long term (current) use of insulin: Secondary | ICD-10-CM | POA: Diagnosis not present

## 2023-05-12 DIAGNOSIS — H25013 Cortical age-related cataract, bilateral: Secondary | ICD-10-CM | POA: Diagnosis not present

## 2023-05-12 DIAGNOSIS — H25043 Posterior subcapsular polar age-related cataract, bilateral: Secondary | ICD-10-CM | POA: Diagnosis not present

## 2023-05-12 DIAGNOSIS — H2513 Age-related nuclear cataract, bilateral: Secondary | ICD-10-CM | POA: Diagnosis not present

## 2023-05-12 DIAGNOSIS — H52203 Unspecified astigmatism, bilateral: Secondary | ICD-10-CM | POA: Diagnosis not present

## 2023-05-12 DIAGNOSIS — H16421 Pannus (corneal), right eye: Secondary | ICD-10-CM | POA: Diagnosis not present

## 2023-05-12 DIAGNOSIS — H11153 Pinguecula, bilateral: Secondary | ICD-10-CM | POA: Diagnosis not present

## 2023-05-12 LAB — HM DIABETES EYE EXAM

## 2023-05-12 MED ORDER — EMPAGLIFLOZIN 25 MG PO TABS
25.0000 mg | ORAL_TABLET | Freq: Every day | ORAL | 2 refills | Status: DC
  Filled 2023-05-14: qty 30, 30d supply, fill #0
  Filled 2023-06-21: qty 30, 30d supply, fill #1
  Filled 2023-07-02 – 2023-07-07 (×2): qty 30, 30d supply, fill #2
  Filled ????-??-??: fill #2

## 2023-05-14 ENCOUNTER — Other Ambulatory Visit (HOSPITAL_COMMUNITY): Payer: Self-pay

## 2023-05-20 ENCOUNTER — Encounter: Payer: Self-pay | Admitting: Dietician

## 2023-06-01 ENCOUNTER — Other Ambulatory Visit (HOSPITAL_COMMUNITY): Payer: Self-pay

## 2023-06-08 ENCOUNTER — Other Ambulatory Visit (HOSPITAL_COMMUNITY): Payer: Self-pay

## 2023-06-08 ENCOUNTER — Telehealth: Payer: Self-pay | Admitting: Student

## 2023-06-08 NOTE — Telephone Encounter (Signed)
 Checked patients pharmacy profile at [facility].   Atorvastatin is ready at no charge.   Arthur Howard is ready for refill and should cost patient $47. However he is enrolled in the Medicare Prescription Payment Plan and pharmacy should be able to bill this correctly. Patient shoudnt pay $47 upfront.

## 2023-06-08 NOTE — Telephone Encounter (Signed)
 Copied from CRM [id number]. Topic: Clinical - Prescription Issue >> Jun 05, 2023 11:53 AM Claudine Mouton wrote: Reason for CRM: Patient's daughter Donnal Debar called to advise that patient's medications are out of their budget. Patient's daughter was advised to call clinic for manufacturers coupons. Please assist.   Medications listed:   atorvastatin (LIPITOR) 40 MG tablet [[id number]] empagliflozin (JARDIANCE) 25 MG TABS tablet [[id number]]  [facility] [address] [place] [address] Phone: [phone]  Fax: [phone] DEA #: [id number]

## 2023-06-08 NOTE — Telephone Encounter (Signed)
 Arthur Howard, are you able to assist the patient?

## 2023-06-09 ENCOUNTER — Encounter (HOSPITAL_COMMUNITY): Payer: Self-pay

## 2023-06-09 ENCOUNTER — Other Ambulatory Visit (HOSPITAL_COMMUNITY): Payer: Self-pay

## 2023-06-09 ENCOUNTER — Other Ambulatory Visit: Payer: Self-pay | Admitting: Student

## 2023-06-09 ENCOUNTER — Telehealth: Payer: Self-pay

## 2023-06-09 DIAGNOSIS — E113393 Type 2 diabetes mellitus with moderate nonproliferative diabetic retinopathy without macular edema, bilateral: Secondary | ICD-10-CM

## 2023-06-09 MED ORDER — JANUMET XR 100-1000 MG PO TB24
1.0000 | ORAL_TABLET | Freq: Every day | ORAL | 0 refills | Status: DC
Start: 2023-06-09 — End: 2023-09-02
  Filled 2023-06-09: qty 30, 30d supply, fill #0
  Filled 2023-07-02: qty 30, 30d supply, fill #1
  Filled 2023-08-10: qty 30, 30d supply, fill #2

## 2023-06-09 NOTE — Telephone Encounter (Signed)
 Called and spoke with Pt's daughter Debarah Crape about scheduling appt for her father to get yearly physical. Appt has been scheduled fon 07/13/2023 @ 10:15, with Dr. Colbert Coyer.

## 2023-06-09 NOTE — Telephone Encounter (Signed)
 Copied from CRM [id number]. Topic: Appointments - Scheduling Inquiry for Clinic >> Jun 09, 2023  1:06 PM Alvino Blood C wrote: Reason for CRM: Patient's daughter Debarah Crape would like a call back to schedule her father for a physical some time in April. Earliest available was on 6/25. Claudia's call back # is [phone]

## 2023-06-22 ENCOUNTER — Other Ambulatory Visit (HOSPITAL_COMMUNITY): Payer: Self-pay

## 2023-07-02 ENCOUNTER — Other Ambulatory Visit: Payer: Self-pay

## 2023-07-02 ENCOUNTER — Other Ambulatory Visit (HOSPITAL_COMMUNITY): Payer: Self-pay

## 2023-07-03 ENCOUNTER — Other Ambulatory Visit (HOSPITAL_COMMUNITY): Payer: Self-pay

## 2023-07-07 ENCOUNTER — Encounter (HOSPITAL_COMMUNITY): Payer: Self-pay

## 2023-07-07 ENCOUNTER — Other Ambulatory Visit (HOSPITAL_COMMUNITY): Payer: Self-pay

## 2023-07-09 ENCOUNTER — Other Ambulatory Visit (HOSPITAL_COMMUNITY): Payer: Self-pay

## 2023-07-09 ENCOUNTER — Encounter (HOSPITAL_COMMUNITY): Payer: Self-pay

## 2023-07-13 ENCOUNTER — Encounter: Admitting: Student

## 2023-08-10 ENCOUNTER — Other Ambulatory Visit (HOSPITAL_COMMUNITY): Payer: Self-pay

## 2023-08-11 ENCOUNTER — Other Ambulatory Visit (HOSPITAL_COMMUNITY): Payer: Self-pay

## 2023-08-14 ENCOUNTER — Other Ambulatory Visit (HOSPITAL_COMMUNITY): Payer: Self-pay

## 2023-08-14 MED ORDER — BROMFENAC SODIUM (ONCE-DAILY) 0.09 % OP SOLN
1.0000 [drp] | Freq: Every day | OPHTHALMIC | 1 refills | Status: DC
Start: 2023-05-12 — End: 2023-12-29
  Filled 2023-08-14: qty 1.7, 34d supply, fill #0

## 2023-08-14 MED ORDER — MOXIFLOXACIN HCL 0.5 % OP SOLN
1.0000 [drp] | Freq: Four times a day (QID) | OPHTHALMIC | 1 refills | Status: DC
Start: 2023-05-12 — End: 2023-12-29
  Filled 2023-08-14 (×2): qty 3, 15d supply, fill #0
  Filled 2023-09-22: qty 3, 15d supply, fill #1

## 2023-08-14 MED ORDER — PREDNISOLONE ACETATE 1 % OP SUSP
1.0000 [drp] | Freq: Four times a day (QID) | OPHTHALMIC | 2 refills | Status: DC
Start: 2023-05-12 — End: 2023-12-30
  Filled 2023-08-14: qty 5, 25d supply, fill #0
  Filled 2023-10-06: qty 5, 25d supply, fill #1

## 2023-08-26 DIAGNOSIS — H2511 Age-related nuclear cataract, right eye: Secondary | ICD-10-CM | POA: Diagnosis not present

## 2023-08-26 DIAGNOSIS — H25811 Combined forms of age-related cataract, right eye: Secondary | ICD-10-CM | POA: Diagnosis not present

## 2023-08-28 ENCOUNTER — Encounter: Payer: Self-pay | Admitting: *Deleted

## 2023-09-02 ENCOUNTER — Other Ambulatory Visit: Payer: Self-pay | Admitting: Student

## 2023-09-02 ENCOUNTER — Other Ambulatory Visit: Payer: Self-pay

## 2023-09-02 DIAGNOSIS — I6930 Unspecified sequelae of cerebral infarction: Secondary | ICD-10-CM

## 2023-09-02 DIAGNOSIS — E1169 Type 2 diabetes mellitus with other specified complication: Secondary | ICD-10-CM

## 2023-09-02 DIAGNOSIS — Z794 Long term (current) use of insulin: Secondary | ICD-10-CM

## 2023-09-03 ENCOUNTER — Other Ambulatory Visit: Payer: Self-pay

## 2023-09-03 ENCOUNTER — Other Ambulatory Visit (HOSPITAL_COMMUNITY): Payer: Self-pay

## 2023-09-03 MED ORDER — JANUMET XR 100-1000 MG PO TB24
1.0000 | ORAL_TABLET | Freq: Every day | ORAL | 0 refills | Status: DC
  Filled 2023-09-03 – 2023-09-08 (×2): qty 90, 90d supply, fill #0
  Filled 2023-09-08 (×2): qty 30, 30d supply, fill #0

## 2023-09-03 MED ORDER — EMPAGLIFLOZIN 25 MG PO TABS
25.0000 mg | ORAL_TABLET | Freq: Every day | ORAL | 2 refills | Status: DC
Start: 2023-09-03 — End: 2024-01-06
  Filled 2023-09-03: qty 30, 30d supply, fill #0
  Filled 2023-10-05: qty 30, 30d supply, fill #1
  Filled 2023-11-13: qty 30, 30d supply, fill #2

## 2023-09-03 MED ORDER — ATORVASTATIN CALCIUM 40 MG PO TABS
40.0000 mg | ORAL_TABLET | Freq: Every day | ORAL | 1 refills | Status: DC
  Filled 2023-09-03: qty 90, 90d supply, fill #0
  Filled 2023-12-23: qty 90, 90d supply, fill #1

## 2023-09-07 ENCOUNTER — Other Ambulatory Visit (HOSPITAL_COMMUNITY): Payer: Self-pay

## 2023-09-08 ENCOUNTER — Other Ambulatory Visit (HOSPITAL_COMMUNITY): Payer: Self-pay

## 2023-09-08 ENCOUNTER — Encounter (HOSPITAL_COMMUNITY): Payer: Self-pay

## 2023-09-08 ENCOUNTER — Other Ambulatory Visit: Payer: Self-pay

## 2023-09-08 DIAGNOSIS — H2512 Age-related nuclear cataract, left eye: Secondary | ICD-10-CM | POA: Diagnosis not present

## 2023-09-09 ENCOUNTER — Other Ambulatory Visit (HOSPITAL_COMMUNITY): Payer: Self-pay

## 2023-09-09 ENCOUNTER — Encounter (HOSPITAL_COMMUNITY): Payer: Self-pay

## 2023-09-09 ENCOUNTER — Other Ambulatory Visit: Payer: Self-pay

## 2023-09-10 ENCOUNTER — Other Ambulatory Visit (HOSPITAL_COMMUNITY): Payer: Self-pay

## 2023-09-16 DIAGNOSIS — H25812 Combined forms of age-related cataract, left eye: Secondary | ICD-10-CM | POA: Diagnosis not present

## 2023-09-16 DIAGNOSIS — E1165 Type 2 diabetes mellitus with hyperglycemia: Secondary | ICD-10-CM | POA: Diagnosis not present

## 2023-09-16 DIAGNOSIS — H2512 Age-related nuclear cataract, left eye: Secondary | ICD-10-CM | POA: Diagnosis not present

## 2023-09-17 DIAGNOSIS — Z961 Presence of intraocular lens: Secondary | ICD-10-CM | POA: Diagnosis not present

## 2023-09-22 ENCOUNTER — Other Ambulatory Visit (HOSPITAL_COMMUNITY): Payer: Self-pay

## 2023-09-23 ENCOUNTER — Other Ambulatory Visit (HOSPITAL_COMMUNITY): Payer: Self-pay

## 2023-09-23 ENCOUNTER — Encounter (HOSPITAL_COMMUNITY): Payer: Self-pay

## 2023-09-28 DIAGNOSIS — Z961 Presence of intraocular lens: Secondary | ICD-10-CM | POA: Diagnosis not present

## 2023-10-05 ENCOUNTER — Other Ambulatory Visit (HOSPITAL_COMMUNITY): Payer: Self-pay

## 2023-10-06 ENCOUNTER — Ambulatory Visit: Payer: Self-pay

## 2023-10-06 ENCOUNTER — Other Ambulatory Visit (HOSPITAL_COMMUNITY): Payer: Self-pay

## 2023-10-06 VITALS — BP 137/80 | HR 64 | Temp 98.4°F | Ht 62.0 in | Wt 144.2 lb

## 2023-10-06 DIAGNOSIS — Z7984 Long term (current) use of oral hypoglycemic drugs: Secondary | ICD-10-CM | POA: Diagnosis not present

## 2023-10-06 DIAGNOSIS — Z23 Encounter for immunization: Secondary | ICD-10-CM | POA: Diagnosis not present

## 2023-10-06 DIAGNOSIS — E113393 Type 2 diabetes mellitus with moderate nonproliferative diabetic retinopathy without macular edema, bilateral: Secondary | ICD-10-CM | POA: Diagnosis not present

## 2023-10-06 DIAGNOSIS — E1169 Type 2 diabetes mellitus with other specified complication: Secondary | ICD-10-CM | POA: Diagnosis not present

## 2023-10-06 DIAGNOSIS — E785 Hyperlipidemia, unspecified: Secondary | ICD-10-CM

## 2023-10-06 DIAGNOSIS — Z794 Long term (current) use of insulin: Secondary | ICD-10-CM | POA: Diagnosis not present

## 2023-10-06 DIAGNOSIS — Z0000 Encounter for general adult medical examination without abnormal findings: Secondary | ICD-10-CM

## 2023-10-06 LAB — GLUCOSE, CAPILLARY: Glucose-Capillary: 167 mg/dL — ABNORMAL HIGH (ref 70–99)

## 2023-10-06 LAB — POCT GLYCOSYLATED HEMOGLOBIN (HGB A1C): Hemoglobin A1C: 7.4 % — AB (ref 4.0–5.6)

## 2023-10-06 MED ORDER — JANUMET XR 100-1000 MG PO TB24
1.0000 | ORAL_TABLET | Freq: Every day | ORAL | 3 refills | Status: DC
Start: 2023-10-06 — End: 2024-01-06
  Filled 2023-10-06: qty 90, 90d supply, fill #0

## 2023-10-06 NOTE — Progress Notes (Signed)
 Established Patient Office Visit  Subjective   Patient ID: Arthur Howard, male    DOB: October 03, 1965  Age: 58 y.o. MRN: [id number]  Chief Complaint  Patient presents with   Diabetes   Arthur Howard is a 58 yr old male with a history of DMII complicated by retinopathy, and prior CVA with residual right-sided weakness, who presents to clinic today for follow up of his diabetes. He is accompanied by his daughter Arthur Howard who is translating the visit for him.   He was last seen in 04/2023 by Dr. Gregary in the [facility]. A1C at that time was 7.1, today it is increased to 7.4. He is currently taking Jardiance  25 mg daily and Janumet  [phone] mg daily. He reports complete adherence to this regimen and reports no concerns with these medications. He does state that although he has adhered strictly to his diet modifications, he has not been exercising and going on walks as he was previously. His last microalbumin creatinine ratio was wnl. He has been following with Ophthalmology Dr. Janit for his retinopathy. He underwent left eye cataract surgery on 09/16/23. At his last visit with them on 09/28/23, he was instructed to discontinue Bromfenac  and Moxifloxacin  eye drops, and to continue prednisolone  eye drops until he follows up with Dr. Janit.    ROS: Denies headaches, dizziness, fever, chills, runny nose, sore throat, vision changes, hearing changes, chest pain, shortness of breath, difficulty breathing, nausea, vomiting, abdominal pain. Denies increased urinary frequency, pain with urination, constipation or diarrhea. No recent falls.      Objective:     BP 137/80 (BP Location: Left Arm, Patient Position: Sitting, Cuff Size: Normal)   Pulse 64   Temp 98.4 F (36.9 C) (Oral)   Ht 5' 2 (1.575 m)   Wt 144 lb 3.2 oz (65.4 kg)   SpO2 96%   BMI 26.37 kg/m  BP Readings from Last 3 Encounters:  10/06/23 137/80  05/04/23 122/65  03/02/23 133/61   Wt Readings from Last 3 Encounters:  10/06/23 144 lb 3.2 oz  (65.4 kg)  05/04/23 145 lb 11.2 oz (66.1 kg)  03/02/23 144 lb 9.6 oz (65.6 kg)      Physical Exam:   Constitutional: well-appearing male sitting in exam chair, in no acute distress. Ambulates without use of assistance device  HEENT: normocephalic atraumatic, mucous membranes moist Eyes: conjunctiva non-erythematous Cardiovascular: regular rate and rhythm, bilateral radial pulses 2+, bilateral dorsal pedal pulses 2+, brisk capillary refill bilateral feet and hands  Pulmonary/Chest: normal work of breathing on room air, lungs clear to auscultation bilaterally Abdominal: soft, non-tender, non-distended MSK: normal bulk and tone. Reduced AROM and strength in right UE and right LE from prior CVA Neurological: alert & oriented x 3, sensation intact bilateral feet to monofilament Skin: warm and dry, no ulcers or lesions on bilateral feet Psych: mood calm, behavior normal, thought content normal, judgement normal     Results for orders placed or performed in visit on 10/06/23  Glucose, capillary  Result Value Ref Range   Glucose-Capillary 167 (H) 70 - 99 mg/dL  POC Hbg J8R  Result Value Ref Range   Hemoglobin A1C 7.4 (A) 4.0 - 5.6 %   HbA1c POC (<> result, manual entry)     HbA1c, POC (prediabetic range)     HbA1c, POC (controlled diabetic range)      Last metabolic panel Lab Results  Component Value Date   GLUCOSE 172 (H) 03/02/2023   NA 139 03/02/2023   K  4.4 03/02/2023   CL 104 03/02/2023   CO2 22 03/02/2023   BUN 25 (H) 03/02/2023   CREATININE 1.09 03/02/2023   EGFR 78 03/02/2023   CALCIUM  9.3 03/02/2023   PROT 6.8 06/15/2021   ALBUMIN 4.1 06/15/2021   LABGLOB 2.3 05/01/2020   AGRATIO 1.9 05/01/2020   BILITOT 0.5 06/15/2021   ALKPHOS 76 06/15/2021   AST 23 06/15/2021   ALT 29 06/15/2021   ANIONGAP 10 06/15/2021   Last lipids Lab Results  Component Value Date   CHOL 133 06/04/2022   HDL 36 (L) 06/04/2022   LDLCALC 57 06/04/2022   TRIG 254 (H) 06/04/2022    CHOLHDL 3.7 06/04/2022   Last hemoglobin A1c Lab Results  Component Value Date   HGBA1C 7.4 (A) 10/06/2023      The ASCVD Risk score (Arnett DK, et al., 2019) failed to calculate for the following reasons:   Risk score cannot be calculated because patient has a medical history suggesting prior/existing ASCVD    Assessment & Plan:   Problem List Items Addressed This Visit       Endocrine   Dyslipidemia associated with type 2 diabetes mellitus (HCC)   Patient with DMII and prior CVA, with a history of dyslipidemia. He is taking Atorvastatin  40 mg daily without issues. We will check a lipid panel today, as it has been a year and a half since it was last checked, though at that time LDL was 57.  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 Today his A1C has slightly increased to 7.4, elevated from 7.1 at last visit on 04/2023. He notes that he has been strictly adhering to his diet modifications without any changes, though he has not been exercising or walking like he used to. We discussed going on walks with his daughters which he thinks will also help his mood, and discussed the PREP program at the [facility]. Patient is very interested in exploring this option. Placed referral with instructions to call patient's daughter Arthur Howard to assist with this. He is currently at his max dose of Jardiance  25 mg daily, and we discussed increasing Janumet  [phone] mg daily up to Janumet  50-1000 mg BID, although patient would like to try to increase his exercising before going up on his diabetes medication. He understands that if A1C continues to uptrend despite his lifestyle modifications, then we will  need to make adjustments to his medications at the next visit.  - [employer] PREP referral placed  - Continue Janumet  [phone] mg daily - Recheck A1C in 3 months, consider uptitrating Janumet  at next visit if warranted       Relevant Medications   SitaGLIPtin -MetFORMIN  HCl (JANUMET  XR) [phone] MG TB24   Other Relevant Orders   POC Hbg A1C (Completed)   Amb Referral To Provider Referral Exercise Program (P.R.E.P)   Lipid Profile     Other   Healthcare maintenance   Patient states that he has had shingles as well as the Shingrix vaccine at an outside clinic, though this is not populating in the [employer] registry.  - Pneumococcal (Prevnar 20) today        Other Visit Diagnoses  Encounter for immunization       Relevant Orders   Pneumococcal conjugate vaccine 20-valent (Completed)       Return in about 3 months (around 01/06/2024) for diabetes recheck.    Patient discussed with Dr. Lovie, who also saw and evaluated the patient.  Doyal Miyamoto, MD [facility] Internal Medicine  PGY-1  10/06/23, 11:41 AM

## 2023-10-06 NOTE — Patient Instructions (Addendum)
 Thank you, [name] for allowing us  to provide your care today. Today we discussed the following:  - We are nearly maxed out on your diabetes medications. As we discussed, we will try to increase exercise through the PREP program and exercising with your daughters, but at the next visit we may need to increase the Janumet  or make other adjustments.    I have ordered the following labs for you:   Lab Orders         Lipid Profile         Glucose, capillary         POC Hbg A1C       Referrals ordered today:    Referral Orders         Amb Referral To Provider Referral Exercise Program (P.R.E.P)       Follow up: 3 months    Remember: Continue your diet modifications and exercise!  Should you have any questions or concerns please call the Internal Medicine Clinic at [phone].     Arthur Miyamoto, MD [facility]

## 2023-10-06 NOTE — Assessment & Plan Note (Signed)
 Patient states that he has had shingles as well as the Shingrix vaccine at an outside clinic, though this is not populating in the [employer] registry.  - Pneumococcal (Prevnar 20) today

## 2023-10-06 NOTE — Assessment & Plan Note (Signed)
 Patient with DMII and prior CVA, with a history of dyslipidemia. He is taking Atorvastatin  40 mg daily without issues. We will check a lipid panel today, as it has been a year and a half since it was last checked, though at that time LDL was 57.  - Continue Atorvastatin  40 mg daily - f/u lipid panel

## 2023-10-06 NOTE — Assessment & Plan Note (Addendum)
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 Today his A1C has slightly increased to 7.4, elevated from 7.1 at last visit on 04/2023. He notes that he has been strictly adhering to his diet modifications without any changes, though he has not been exercising or walking like he used to. We discussed going on walks with his daughters which he thinks will also help his mood, and discussed the PREP program at the [facility]. Patient is very interested in exploring this option. Placed referral with instructions to call patient's daughter [name] to assist with this. He is currently at his max dose of Jardiance  25 mg daily, and we discussed increasing Janumet  [phone] mg daily up to Janumet  50-1000 mg BID, although patient would like to try to increase his exercising before going up on his diabetes medication. He understands that if A1C continues to uptrend despite his lifestyle modifications, then we will need to make adjustments to his medications at the next visit.  - [employer] PREP referral placed  - Continue Janumet  [phone] mg daily - Recheck A1C in 3 months, consider uptitrating Janumet  at next visit if warranted    >>ASSESSMENT AND PLAN FOR DIABETES MELLITUS (HCC) WRITTEN ON 10/06/2023 11:38 AM BY NGUYEN, DIANA, MD  Patient with DMII and prior CVA, with a history of dyslipidemia. He is taking Atorvastatin  40 mg daily without issues. We will check a lipid panel today, as it has been a year and a half since it was last checked, though at that time LDL was 57.  - Continue Atorvastatin  40 mg daily - f/u lipid panel

## 2023-10-07 LAB — LIPID PANEL
Chol/HDL Ratio: 3.5 ratio (ref 0.0–5.0)
Cholesterol, Total: 149 mg/dL (ref 100–199)
HDL: 42 mg/dL (ref >39)
LDL Chol Calc (NIH): 80 mg/dL (ref 0–99)
Triglycerides: 155 mg/dL — ABNORMAL HIGH (ref 0–149)
VLDL Cholesterol Cal: 27 mg/dL (ref 5–40)

## 2023-10-07 NOTE — Progress Notes (Signed)
Internal Medicine Clinic Attending  I was physically present during the key portions of the resident provided service and participated in the medical decision making of patient's management care. I reviewed pertinent patient test results.  The assessment, diagnosis, and plan were formulated together and I agree with the documentation in the resident's note.  Mercie Eon, MD

## 2023-10-12 ENCOUNTER — Telehealth: Payer: Self-pay

## 2023-10-12 NOTE — Telephone Encounter (Signed)
 Called  re: PREP program referral, left voicemail requesting return call.

## 2023-10-19 ENCOUNTER — Telehealth: Payer: Self-pay

## 2023-10-19 DIAGNOSIS — H524 Presbyopia: Secondary | ICD-10-CM | POA: Diagnosis not present

## 2023-10-19 DIAGNOSIS — Z961 Presence of intraocular lens: Secondary | ICD-10-CM | POA: Diagnosis not present

## 2023-10-19 LAB — HM DIABETES EYE EXAM

## 2023-10-19 NOTE — Telephone Encounter (Signed)
 Called again re: PREP program referral, left another requesting return call.

## 2023-11-13 ENCOUNTER — Other Ambulatory Visit (HOSPITAL_COMMUNITY): Payer: Self-pay

## 2023-11-19 ENCOUNTER — Other Ambulatory Visit (HOSPITAL_COMMUNITY): Payer: Self-pay

## 2023-12-24 ENCOUNTER — Other Ambulatory Visit (HOSPITAL_COMMUNITY): Payer: Self-pay

## 2023-12-28 ENCOUNTER — Emergency Department (HOSPITAL_COMMUNITY): Admission: EM | Admit: 2023-12-28 | Discharge: 2023-12-29

## 2023-12-28 ENCOUNTER — Other Ambulatory Visit: Payer: Self-pay

## 2023-12-28 ENCOUNTER — Emergency Department (HOSPITAL_COMMUNITY)

## 2023-12-28 DIAGNOSIS — G8191 Hemiplegia, unspecified affecting right dominant side: Secondary | ICD-10-CM | POA: Diagnosis present

## 2023-12-28 DIAGNOSIS — I6389 Other cerebral infarction: Secondary | ICD-10-CM | POA: Diagnosis not present

## 2023-12-28 DIAGNOSIS — M6281 Muscle weakness (generalized): Secondary | ICD-10-CM | POA: Diagnosis not present

## 2023-12-28 DIAGNOSIS — I69351 Hemiplegia and hemiparesis following cerebral infarction affecting right dominant side: Secondary | ICD-10-CM | POA: Diagnosis not present

## 2023-12-28 DIAGNOSIS — R531 Weakness: Secondary | ICD-10-CM | POA: Diagnosis not present

## 2023-12-28 DIAGNOSIS — Z8673 Personal history of transient ischemic attack (TIA), and cerebral infarction without residual deficits: Secondary | ICD-10-CM | POA: Insufficient documentation

## 2023-12-28 DIAGNOSIS — Z5321 Procedure and treatment not carried out due to patient leaving prior to being seen by health care provider: Secondary | ICD-10-CM | POA: Insufficient documentation

## 2023-12-28 DIAGNOSIS — Z794 Long term (current) use of insulin: Secondary | ICD-10-CM | POA: Diagnosis not present

## 2023-12-28 DIAGNOSIS — M7918 Myalgia, other site: Secondary | ICD-10-CM | POA: Insufficient documentation

## 2023-12-28 DIAGNOSIS — G459 Transient cerebral ischemic attack, unspecified: Secondary | ICD-10-CM | POA: Diagnosis not present

## 2023-12-28 DIAGNOSIS — E113299 Type 2 diabetes mellitus with mild nonproliferative diabetic retinopathy without macular edema, unspecified eye: Secondary | ICD-10-CM | POA: Diagnosis not present

## 2023-12-28 DIAGNOSIS — I6381 Other cerebral infarction due to occlusion or stenosis of small artery: Secondary | ICD-10-CM | POA: Diagnosis not present

## 2023-12-28 DIAGNOSIS — Z7982 Long term (current) use of aspirin: Secondary | ICD-10-CM | POA: Diagnosis not present

## 2023-12-28 DIAGNOSIS — I10 Essential (primary) hypertension: Secondary | ICD-10-CM | POA: Diagnosis not present

## 2023-12-28 DIAGNOSIS — Z79899 Other long term (current) drug therapy: Secondary | ICD-10-CM | POA: Diagnosis not present

## 2023-12-28 LAB — CBC
HCT: 49.9 % (ref 39.0–52.0)
Hemoglobin: 16.5 g/dL (ref 13.0–17.0)
MCH: 29.9 pg (ref 26.0–34.0)
MCHC: 33.1 g/dL (ref 30.0–36.0)
MCV: 90.4 fL (ref 80.0–100.0)
Platelets: 293 K/uL (ref 150–400)
RBC: 5.52 MIL/uL (ref 4.22–5.81)
RDW: 12.1 % (ref 11.5–15.5)
WBC: 8.6 K/uL (ref 4.0–10.5)
nRBC: 0 % (ref 0.0–0.2)

## 2023-12-28 LAB — COMPREHENSIVE METABOLIC PANEL WITH GFR
ALT: 22 U/L (ref 0–44)
AST: 24 U/L (ref 15–41)
Albumin: 4.5 g/dL (ref 3.5–5.0)
Alkaline Phosphatase: 89 U/L (ref 38–126)
Anion gap: 13 (ref 5–15)
BUN: 19 mg/dL (ref 6–20)
CO2: 23 mmol/L (ref 22–32)
Calcium: 9.9 mg/dL (ref 8.9–10.3)
Chloride: 102 mmol/L (ref 98–111)
Creatinine, Ser: 0.81 mg/dL (ref 0.61–1.24)
GFR, Estimated: >60 mL/min (ref >60)
Glucose, Bld: 158 mg/dL — ABNORMAL HIGH (ref 70–99)
Potassium: 4 mmol/L (ref 3.5–5.1)
Sodium: 139 mmol/L (ref 135–145)
Total Bilirubin: 0.4 mg/dL (ref 0.0–1.2)
Total Protein: 7.4 g/dL (ref 6.5–8.1)

## 2023-12-28 LAB — CBG MONITORING, ED: Glucose-Capillary: 166 mg/dL — ABNORMAL HIGH (ref 70–99)

## 2023-12-28 NOTE — ED Triage Notes (Signed)
 Pt here for right-sided weakness. Pt states he feels weak from the elbow down and calf down to foot. Pt has hx of stroke in 04/2018. Weakness started yesterday at 2030. Pt states he feels pain in right buttocks 3/10.

## 2023-12-29 ENCOUNTER — Observation Stay (HOSPITAL_BASED_OUTPATIENT_CLINIC_OR_DEPARTMENT_OTHER)
Admission: EM | Admit: 2023-12-29 | Discharge: 2023-12-30 | Disposition: A | Discharge status: Home / Self Care | Attending: Infectious Diseases | Admitting: Infectious Diseases

## 2023-12-29 ENCOUNTER — Emergency Department (HOSPITAL_COMMUNITY)

## 2023-12-29 ENCOUNTER — Encounter (HOSPITAL_BASED_OUTPATIENT_CLINIC_OR_DEPARTMENT_OTHER): Payer: Self-pay

## 2023-12-29 DIAGNOSIS — I69351 Hemiplegia and hemiparesis following cerebral infarction affecting right dominant side: Secondary | ICD-10-CM

## 2023-12-29 DIAGNOSIS — E1169 Type 2 diabetes mellitus with other specified complication: Secondary | ICD-10-CM

## 2023-12-29 DIAGNOSIS — Z79899 Other long term (current) drug therapy: Secondary | ICD-10-CM | POA: Diagnosis not present

## 2023-12-29 DIAGNOSIS — Z7982 Long term (current) use of aspirin: Secondary | ICD-10-CM | POA: Insufficient documentation

## 2023-12-29 DIAGNOSIS — Z7984 Long term (current) use of oral hypoglycemic drugs: Secondary | ICD-10-CM

## 2023-12-29 DIAGNOSIS — I639 Cerebral infarction, unspecified: Secondary | ICD-10-CM

## 2023-12-29 DIAGNOSIS — I6389 Other cerebral infarction: Secondary | ICD-10-CM | POA: Diagnosis not present

## 2023-12-29 DIAGNOSIS — I10 Essential (primary) hypertension: Secondary | ICD-10-CM | POA: Insufficient documentation

## 2023-12-29 DIAGNOSIS — G3189 Other specified degenerative diseases of nervous system: Secondary | ICD-10-CM | POA: Diagnosis not present

## 2023-12-29 DIAGNOSIS — E113299 Type 2 diabetes mellitus with mild nonproliferative diabetic retinopathy without macular edema, unspecified eye: Secondary | ICD-10-CM | POA: Insufficient documentation

## 2023-12-29 DIAGNOSIS — M6281 Muscle weakness (generalized): Secondary | ICD-10-CM | POA: Diagnosis not present

## 2023-12-29 DIAGNOSIS — Z8673 Personal history of transient ischemic attack (TIA), and cerebral infarction without residual deficits: Secondary | ICD-10-CM | POA: Diagnosis present

## 2023-12-29 DIAGNOSIS — Z7901 Long term (current) use of anticoagulants: Secondary | ICD-10-CM

## 2023-12-29 DIAGNOSIS — E113393 Type 2 diabetes mellitus with moderate nonproliferative diabetic retinopathy without macular edema, bilateral: Secondary | ICD-10-CM | POA: Diagnosis not present

## 2023-12-29 DIAGNOSIS — E113293 Type 2 diabetes mellitus with mild nonproliferative diabetic retinopathy without macular edema, bilateral: Secondary | ICD-10-CM | POA: Insufficient documentation

## 2023-12-29 DIAGNOSIS — I6930 Unspecified sequelae of cerebral infarction: Secondary | ICD-10-CM

## 2023-12-29 DIAGNOSIS — Z794 Long term (current) use of insulin: Secondary | ICD-10-CM | POA: Insufficient documentation

## 2023-12-29 DIAGNOSIS — I6381 Other cerebral infarction due to occlusion or stenosis of small artery: Secondary | ICD-10-CM

## 2023-12-29 DIAGNOSIS — R531 Weakness: Principal | ICD-10-CM

## 2023-12-29 DIAGNOSIS — R030 Elevated blood-pressure reading, without diagnosis of hypertension: Secondary | ICD-10-CM | POA: Diagnosis present

## 2023-12-29 LAB — LIPID PANEL
Cholesterol: 123 mg/dL (ref 0–200)
HDL: 39 mg/dL — ABNORMAL LOW (ref >40)
LDL Cholesterol: 69 mg/dL (ref 0–99)
Total CHOL/HDL Ratio: 3.2 ratio
Triglycerides: 77 mg/dL (ref <150)
VLDL: 15 mg/dL (ref 0–40)

## 2023-12-29 LAB — URINALYSIS, W/ REFLEX TO CULTURE (INFECTION SUSPECTED)
Bilirubin Urine: NEGATIVE
Glucose, UA: >=500 mg/dL — AB
Hgb urine dipstick: NEGATIVE
Ketones, ur: NEGATIVE mg/dL
Nitrite: NEGATIVE
Protein, ur: NEGATIVE mg/dL
Specific Gravity, Urine: 1.018 (ref 1.005–1.030)
pH: 5 (ref 5.0–8.0)

## 2023-12-29 LAB — HEMOGLOBIN A1C
Hgb A1c MFr Bld: 6.6 % — ABNORMAL HIGH (ref 4.8–5.6)
Mean Plasma Glucose: 142.72 mg/dL

## 2023-12-29 LAB — HIV ANTIBODY (ROUTINE TESTING W REFLEX): HIV Screen 4th Generation wRfx: NONREACTIVE

## 2023-12-29 MED ORDER — CLOPIDOGREL BISULFATE 75 MG PO TABS
75.0000 mg | ORAL_TABLET | Freq: Every day | ORAL | Status: DC
  Administered 2023-12-29 – 2023-12-30 (×2): 75 mg via ORAL
  Filled 2023-12-29 (×2): qty 1

## 2023-12-29 MED ORDER — ASPIRIN 81 MG PO CHEW
81.0000 mg | CHEWABLE_TABLET | Freq: Every day | ORAL | Status: DC
  Administered 2023-12-29 – 2023-12-30 (×2): 81 mg via ORAL
  Filled 2023-12-29 (×2): qty 1

## 2023-12-29 MED ORDER — ENOXAPARIN SODIUM 40 MG/0.4ML IJ SOSY
40.0000 mg | PREFILLED_SYRINGE | INTRAMUSCULAR | Status: DC
  Administered 2023-12-29 – 2023-12-30 (×2): 40 mg via SUBCUTANEOUS
  Filled 2023-12-29 (×2): qty 0.4

## 2023-12-29 MED ORDER — ASPIRIN 300 MG RE SUPP
300.0000 mg | Freq: Every day | RECTAL | Status: DC
Start: 2023-12-29 — End: 2023-12-30
  Filled 2023-12-29: qty 1

## 2023-12-29 MED ORDER — ATORVASTATIN CALCIUM 80 MG PO TABS
80.0000 mg | ORAL_TABLET | Freq: Every day | ORAL | Status: DC
  Administered 2023-12-29 – 2023-12-30 (×2): 80 mg via ORAL
  Filled 2023-12-29 (×2): qty 1

## 2023-12-29 MED ORDER — EMPAGLIFLOZIN 25 MG PO TABS
25.0000 mg | ORAL_TABLET | Freq: Every day | ORAL | Status: DC
  Administered 2023-12-30: 25 mg via ORAL
  Filled 2023-12-29 (×2): qty 1

## 2023-12-29 NOTE — ED Notes (Signed)
 Patient transported to MRI

## 2023-12-29 NOTE — ED Notes (Signed)
 Awaiting pt from lobby

## 2023-12-29 NOTE — ED Notes (Signed)
 Pt informed registration he was leaving.

## 2023-12-29 NOTE — Plan of Care (Signed)

## 2023-12-29 NOTE — ED Provider Notes (Signed)
 [facility] AT [facility]  Provider Note  CSN: [id number] Arrival date & time: 12/29/23 9642  History Chief Complaint  Patient presents with  . Weakness    R side   Family at bedside interprets, refused a video interpreter Arthur Howard is a 58 y.o. male with history of DM, CVA affecting R side in 2020 has mostly recovered with mild residual weakness. He reports feeling some increased weakness since the morning of 10/5 (I clarified this multiple times as this is in contrast to triage notes at [facility] and here). He states he was trying to push up on R arm to get out of bed and it gave out on him. He feels weakness in the R leg when walking. He feels like his speech is more slurred but his family hasn't noticed. He initially went to the [facility] where he had labs and CT (all unremarkable) but left due to wait times and came here instead.    Home Medications Prior to Admission medications   Medication Sig Start Date End Date Taking? Authorizing Provider  Accu-Chek Softclix Lancets lancets Use as directed 4 (four) times daily. 03/10/22   Jinwala, Sagar H, MD  aspirin  81 MG EC tablet Take 1 tablet (81 mg total) by mouth daily. 06/26/21   Sherlean Failing, MD  atorvastatin  (LIPITOR ) 40 MG tablet Take 1 tablet (40 mg total) by mouth daily. 09/03/23   Gregary Sharper, MD  Blood Glucose Monitoring Suppl (ACCU-CHEK GUIDE ME) w/Device KIT Use to test blood sugar with test strips. 06/30/22   Jinwala, Sagar H, MD  Bromfenac  Sodium 0.09 % SOLN Place 1 drop into the right eye daily. (Start 3 hours after you get home from surgery) 05/12/23     cetirizine  (ZYRTEC ) 10 MG tablet Take 1 tablet (10 mg total) by mouth daily. 03/15/23     docusate sodium  (COLACE) 100 MG capsule Take 1 capsule (100 mg total) by mouth 2 (two) times daily for 10 days. 07/13/22     empagliflozin  (JARDIANCE ) 25 MG TABS tablet Take 1 tablet (25 mg total) by mouth daily before breakfast. 09/03/23   Gregary Sharper, MD   glucose blood (ACCU-CHEK GUIDE) test strip Use to test blood glucose 4 (four) times daily. 03/10/22   Jinwala, Sagar H, MD  moxifloxacin  (VIGAMOX ) 0.5 % ophthalmic solution Place 1 drop into the right eye 4 (four) times daily. (Start 3 hours after you get home from surgery) 05/12/23     polyethylene glycol powder (GLYCOLAX /MIRALAX ) 17 GM/SCOOP powder Mix and drink  17 g by mouth daily. 07/13/22     prednisoLONE  acetate (PRED FORTE ) 1 % ophthalmic suspension Place 1 drop into the right eye 4 (four) times daily. (Start 3 hours after you arrive home from surgery) 05/12/23     SitaGLIPtin -MetFORMIN  HCl (JANUMET  XR) [phone] MG TB24 Take 1 tablet by mouth daily. 10/06/23   Leontine Lapine, MD  blood glucose meter kit and supplies KIT Dispense based on patient and insurance preference. Use up to four times daily as directed. (FOR ICD-9 250.00, 250.01). Patient not taking: Reported on 01/25/2020 05/21/18 01/25/20  Angiulli, Toribio PARAS, PA-C  omega-3 acid ethyl esters (LOVAZA ) 1 g capsule Take 1 capsule (1 g total) by mouth daily. 06/05/20 06/11/20  Dann Candyce RAMAN, MD     Allergies    Patient has no known allergies.   Review of Systems   Review of Systems Please see HPI for pertinent positives and negatives  Physical Exam BP (!) 155/84  Pulse (!) 56   Temp 98.6 F (37 C)   Resp (!) 22   SpO2 99%   Physical Exam Vitals and nursing note reviewed.  Constitutional:      Appearance: Normal appearance.  HENT:     Head: Normocephalic and atraumatic.     Nose: Nose normal.     Mouth/Throat:     Mouth: Mucous membranes are moist.  Eyes:     Extraocular Movements: Extraocular movements intact.     Conjunctiva/sclera: Conjunctivae normal.  Cardiovascular:     Rate and Rhythm: Normal rate.  Pulmonary:     Effort: Pulmonary effort is normal.     Breath sounds: Normal breath sounds.  Abdominal:     General: Abdomen is flat.     Palpations: Abdomen is soft.     Tenderness: There is no abdominal  tenderness.  Musculoskeletal:        General: No swelling. Normal range of motion.     Cervical back: Neck supple.  Skin:    General: Skin is warm and dry.  Neurological:     General: No focal deficit present.     Mental Status: He is alert and oriented to person, place, and time.     Cranial Nerves: No cranial nerve deficit.     Sensory: No sensory deficit.     Motor: No weakness.     Coordination: Coordination normal.     Deep Tendon Reflexes: Reflexes normal.  Psychiatric:        Mood and Affect: Mood normal.     ED Results / Procedures / Treatments   EKG EKG Interpretation Date/Time:  Tuesday December 29 2023 04:03:51 EDT Ventricular Rate:  54 PR Interval:  160 QRS Duration:  89 QT Interval:  383 QTC Calculation: 363 R Axis:   25  Text Interpretation: Sinus rhythm Minimal ST elevation, inferior leads No significant change since last tracing Confirmed by Roselyn Dunnings [phone]) on 12/29/2023 4:13:47 AM  Procedures Procedures  Medications Ordered in the ED Medications - No data to display  Initial Impression and Plan  Patient with remote stroke affecting right side here with subjective weakness on the right side worse from baseline for >24hours. He has a normal neuro exam here. Workup at [facility] was unremarkable. Will discuss with Neurology regarding workup and dispo.   ED Course   Clinical Course as of 12/29/23 0455  Tue Dec 29, 2023  0440 Spoke with Dr. Vanessa, Neurology. Patient is not in treatment window for TNK or LVO and symptoms are mild. He recommends transfer to [facility] for MRI to evaluate new stoke and if negative can avoid unnecessary admission. Dr. Theadore aware and accepting.  [CS]    Clinical Course User Index [CS] Roselyn Dunnings NOVAK, MD     MDM Rules/Calculators/A&P Medical Decision Making Problems Addressed: Right sided weakness: acute illness or injury  Amount and/or Complexity of Data Reviewed Radiology: ordered. ECG/medicine tests: ordered and  independent interpretation performed. Decision-making details documented in ED Course.  Risk Decision regarding hospitalization.     Final Clinical Impression(s) / ED Diagnoses Final diagnoses:  Right sided weakness    Rx / DC Orders ED Discharge Orders     None        Roselyn Dunnings NOVAK, MD 12/29/23 [phone]

## 2023-12-29 NOTE — Consult Note (Signed)
 NEUROLOGY CONSULT NOTE   Date of service: December 30, 2023 Patient Name: Arthur Howard MRN:  [id number] DOB:  Nov 06, 1965 Chief Complaint: Right sided weakness Requesting Provider: Eben Reyes BROCKS, MD  History of Present Illness  Virtual interpretation services were declined, patient's daughter interpreted during encounter. Arthur Howard is a 58 y.o. male with hx of diabetes mellitus, hyperlipidemia, and previous CVA (05/06/2018) who presents to [facility] early this morning after leaving the [facility]. Patient presented at [facility] last night for right-sided weakness. He says that on "Sunday morning (10/5) around 8:00am he began to feel weakness and numbness in his right arm and right leg. He felt like he was dragging his leg while walking. When he tried to go to his bed, his leg gave out and he fell onto the bed. Patient denies any injury from the fall onto the bed and denies hitting his head. He states that he did have a headache that afternoon as well as a headache yesterday, but denies any headache currently. He denies any changes in vision. He is endorsing pain in his right shoulder and right foot with movement. One of his other daughter's, not currently present at bedside, told him and family that his speech had sounded slurred previously. He thinks that his speech sounds normal to him now.   From prior stroke in 2020, patient has had some sustained right sided weakness per family, and struggles with word-finding sometimes. His daughter states that he is on a cholesterol medication that he takes daily as well as an 81mg Aspirin that he takes daily. Head CT was ordered at [facility] and was unremarkable. MRI was ordered at [facility] and showed acute infarct.  LKW: 12/27/23. Modified rankin score: 1-No significant post stroke disability and can perform usual duties with stroke symptoms IV Thrombolysis: No, outside of window. EVT:  No, symptoms too mild and outside of window  NIHSS components Score: Comment  1a  Level of Conscious 0[] 1[] 2[] 3[]     1b LOC Questions 0[] 1[] 2[]      1c LOC Commands 0[] 1[] 2[]      2 Best Gaze 0[] 1[] 2[]      3 Visual 0[] 1[] 2[] 3[]     4 Facial Palsy 0[] 1[] 2[] 3[]     5a Motor Arm - left 0[] 1[] 2[] 3[] 4[] UN[]   5b Motor Arm - Right 0[] 1[] 2[] 3[] 4[] UN[]   6a Motor Leg - Left 0[] 1[] 2[] 3[] 4[] UN[]   6b Motor Leg - Right 0[] 1[] 2[] 3[] 4[] UN[]   7 Limb Ataxia 0[] 1[] 2[] UN[]     8 Sensory 0[] 1[] 2[] UN[]     9 Best Language 0[] 1[] 2[] 3[]     10 Dysarthria 0[] 1[] 2[] UN[]     11"  Extinct. and Inattention 0[]  1[]  2[]       TOTAL:       ROS  Comprehensive ROS performed and pertinent positives documented in HPI   Past History   Past Medical History:  Diagnosis Date   Burning with urination 08/06/2020   Diabetes mellitus without complication (HCC) 2015   Dizziness 04/16/2022   Hyperlipidemia    Increased urinary frequency 04/02/2022   Stroke (HCC) 05/06/2018   R sided weakness    Past Surgical History:  Procedure Laterality Date   ANKLE SURGERY     CLOSED REDUCTION METACARPAL WITH PERCUTANEOUS PINNING Right 07/26/2020   Procedure: CLOSED  REDUCTION METACARPAL WITH PERCUTANEOUS PINNING;  Surgeon: Murrell Drivers, MD;  Location: [facility];  Service: Orthopedics;  Laterality: Right;    Family History: Family History  Problem Relation Age of Onset   Heart disease Mother    Lung cancer Father    Diabetes Sister    Diabetes Brother    CVA Neg Hx    Colon cancer Neg Hx    Colon polyps Neg Hx    Esophageal cancer Neg Hx    Rectal cancer Neg Hx    Stomach cancer Neg Hx     Social History  reports that he has quit smoking. His smoking use included cigarettes. He has never used smokeless tobacco. He reports that he does not drink alcohol and does not use drugs.  No Known Allergies  Medications   Current Facility-Administered Medications:    aspirin  chewable tablet 81 mg, 81 mg, Oral, Daily, 81 mg at 12/29/23 1128  **OR** aspirin  suppository 300 mg, 300 mg, Rectal, Daily, McLendon, Michael, MD   atorvastatin  (LIPITOR ) tablet 80 mg, 80 mg, Oral, Daily, McLendon, Michael, MD, 80 mg at 12/29/23 1344   clopidogrel  (PLAVIX ) tablet 75 mg, 75 mg, Oral, Daily, McLendon, Michael, MD, 75 mg at 12/29/23 1128   empagliflozin  (JARDIANCE ) tablet 25 mg, 25 mg, Oral, QAC breakfast, McLendon, Michael, MD   enoxaparin  (LOVENOX ) injection 40 mg, 40 mg, Subcutaneous, Q24H, Norrine Sharper, MD, 40 mg at 12/29/23 1402  Vitals   Vitals:   12/29/23 1707 12/29/23 1715 12/29/23 1744 12/29/23 2021  BP:  117/68 (!) 147/65 116/60  Pulse:  (!) 56 60 (!) 59  Resp:  18 18 18   Temp: 97.8 F (36.6 C)  (!) 97.4 F (36.3 C) 98.2 F (36.8 C)  TempSrc: Oral  Oral Oral  SpO2:  98% 98% 98%  Height:        Body mass index is 25.75 kg/m.   Physical Exam  Mental Status: Patient is awake, alert, and oriented. He is able to provide a history of events, answer questions, and follow instructions. No dysarthria noted. Constitutional: Lying in bed, in NAD. Psych: Affect appropriate to situation.  Eyes: No scleral injection.  HENT: No OP obstruction.  Head: Normocephalic.  Cardiovascular: No JVD, no edema. Respiratory: Effort normal, non-labored breathing.  GI: Soft.  No distension. There is no tenderness.  Skin: WDI.   Neurologic Examination   Mental Status: Cranial Nerves: II: PERRL, patient able to visualize wiggling fingers in all visual quadrants III,IV,VI: EOMI, no nystagmus. V: Sensation intact bilaterally. VII: Facial movements symmetrical VIII: Hearing intact to voice bilaterally. IX, X: Symmetric palatal lift. XI: Shoulder shrug and chin turn intact bilaterally. XII: Tongue midline without fasciculations. Motor: UE: Raises arms against gravity without drift bilaterally. Right arm significantly weaker than left arm against resistance. LE: Lifts legs against gravity without drift bilaterally. Can lift left leg  higher than right leg. Right leg weaker than left leg against resistance. Decreased ability to dorsiflex right ankle (3/5) compared to left (4/5) Sensory: Sensation intact on bilateral extremities to light touch. Coordination:  FTN and HSK intact bilaterally without ataxia. Gait: deferred.  Labs/Imaging/Neurodiagnostic studies   CBC:  Recent Labs  Lab 27-Jan-2024 2125  WBC 8.6  HGB 16.5  HCT 49.9  MCV 90.4  PLT 293   Basic Metabolic Panel:  Lab Results  Component Value Date   NA 139 01/27/24   K 4.0 Jan 27, 2024   CO2 23 01/27/2024   GLUCOSE 158 (H) 01/27/24   BUN  19 12/28/2023   CREATININE 0.81 12/28/2023   CALCIUM  9.9 12/28/2023   GFRNONAA >60 12/28/2023   GFRAA 110 05/01/2020   Lipid Panel:  Lab Results  Component Value Date   LDLCALC 69 12/29/2023   HgbA1c:  Lab Results  Component Value Date   HGBA1C 6.6 (H) 12/29/2023   Urine Drug Screen:     Component Value Date/Time   LABOPIA NONE DETECTED 05/07/2018 2257   COCAINSCRNUR NONE DETECTED 05/07/2018 2257   LABBENZ NONE DETECTED 05/07/2018 2257   AMPHETMU NONE DETECTED 05/07/2018 2257   THCU NONE DETECTED 05/07/2018 2257   LABBARB NONE DETECTED 05/07/2018 2257    Alcohol Level No results found for: [facility] INR  Lab Results  Component Value Date   INR 0.99 05/06/2018   APTT  Lab Results  Component Value Date   APTT 31 05/06/2018   AED levels: No results found for: PHENYTOIN, ZONISAMIDE, LAMOTRIGINE, LEVETIRACETA  CT Head without contrast(Personally reviewed): 1. No acute intracranial abnormality.   MRI Brain wo Contrast(Personally reviewed): 1. Acute lacunar infarct left corona radiata and posterior left lentiform, no hemorrhage or mass effect. 2. Chronic left pontine lacunar infarct with expected evolution and associated Wallerian degeneration. 3. Chronic lacunar infarcts of the right basal ganglia, chronic frontal lobe white matter changes.  ASSESSMENT  Virtual interpretation  services were declined, patient's daughter interpreted during encounter. Johanan M Giraldo is a 58 y.o. male with a hx of diabetes mellitus, hyperlipidemia, and previous CVA (05/06/2018) who presents to [facility] after leaving the [facility]. Patient presented at [facility] for right-sided weakness. He says that on Sunday morning (10/5) around 8:00am he began to feel weakness and numbness in his right arm and right leg. He did have a headache that afternoon as well as a headache yesterday, but denies any headache currently and denies any changes in vision. He was told that his speech sounded slurred previously, but it sounds normal to him now.   From prior stroke in 2020, patient has had some sustained right sided weakness per family, and struggles with word-finding sometimes. His daughter states that he is on a cholesterol medication that he takes daily as well as an 81mg  Aspirin  that he takes daily as well.  Head CT was negative, MRI of brain confirms new stroke showing acute infarct left corona radiata, chronic left pontine lacunar infarct, and chronic lacunar infarcts of the right basal ganglia and chronic frontal lobe white matter changes. Patient to be admitted for stroke workup.  Etiology of stroke is unclear, stroke seems too large for it to be secondary to small vessel disease. Risk factors for small vessel disease seem relatively well controlled including HbA1c of less than 7 and LDL of less than 70.  RECOMMENDATIONS  Stroke/TIA Workup  - Admit for stroke workup - Permissive HTN x48 hrs from sx onset or until stroke ruled out by MRI goal BP <220/110. PRN labetalol or hydralazine if BP above these parameters. Avoid oral antihypertensives. - MRI brain wo contrast: completed - CTA head and neck. - TTE. - HbA1c of 6.6 is within goal, LDL of 69 is within goal. - aspirin  81mg  daily along with plavix  75mg  daily x 21days, followed by plavix  75mg  daily alone. - q4 hr neuro checks - STAT head CT for any change in  neuro exam - Tele - PT/OT/SLP - Stroke education - Amb referral to neurology upon discharge   ______________________________________________________________________  Plan discussed with patient and with both daughters at the bedside. Plan also discussed with Internal Medicine  residents with the overnight team over secure chat.  Mialynn Shelvin [facility]

## 2023-12-29 NOTE — ED Provider Notes (Signed)
 [place] EMERGENCY DEPARTMENT AT [facility] Provider Note   CSN: [id number] Arrival date & time: 12/29/23  9642     History Chief Complaint  Patient presents with   Weakness    R side    HPI: Arthur Howard is a 58 y.o. male with history pertinent DM, CVA affecting R side in 2020 has mostly recovered with mild residual weakness who presents complaining of increased weakness from baseline. Patient arrived via POV from [facility].  History provided by patient, spouse and daughter at bedside, and chart review.  No interpreter required during this encounter.  Per chart review, patient was seen by Dr. Roselyn overnight at [facility], patient reportedly has had increased right sided weakness from baseline, reportedly since 10/5.  Had reassuring neuroexam at OSH, had previously left without being seen from [facility] where he had a CT head noncontrast, CBC, CMP done in the waiting room that were without acute abnormality.  At [facility], neurology was consulted, and patient was out of window for lytics as well as thrombectomy, therefore was transferred for MRI to further elucidate whether or not patient had evidence of stroke on MRI.  Patient and family at bedside confirm above history, that he has baseline weakness on the right side that has been worse for 2 days, no other recent symptoms.  Patient's recorded medical, surgical, social, medication list and allergies were reviewed in the Snapshot window as part of the initial history.   Prior to Admission medications   Medication Sig Start Date End Date Taking? Authorizing Provider  Accu-Chek Softclix Lancets lancets Use as directed 4 (four) times daily. 03/10/22   Jinwala, Sagar H, MD  aspirin  81 MG EC tablet Take 1 tablet (81 mg total) by mouth daily. 06/26/21   Sherlean Failing, MD  atorvastatin  (LIPITOR ) 40 MG tablet Take 1 tablet (40 mg total) by mouth daily. 09/03/23   Gregary Sharper, MD  Blood Glucose Monitoring  Suppl (ACCU-CHEK GUIDE ME) w/Device KIT Use to test blood sugar with test strips. 06/30/22   Jinwala, Sagar H, MD  Bromfenac  Sodium 0.09 % SOLN Place 1 drop into the right eye daily. (Start 3 hours after you get home from surgery) 05/12/23     cetirizine  (ZYRTEC ) 10 MG tablet Take 1 tablet (10 mg total) by mouth daily. 03/15/23     docusate sodium  (COLACE) 100 MG capsule Take 1 capsule (100 mg total) by mouth 2 (two) times daily for 10 days. 07/13/22     empagliflozin  (JARDIANCE ) 25 MG TABS tablet Take 1 tablet (25 mg total) by mouth daily before breakfast. 09/03/23   Gregary Sharper, MD  glucose blood (ACCU-CHEK GUIDE) test strip Use to test blood glucose 4 (four) times daily. 03/10/22   Jinwala, Sagar H, MD  moxifloxacin  (VIGAMOX ) 0.5 % ophthalmic solution Place 1 drop into the right eye 4 (four) times daily. (Start 3 hours after you get home from surgery) 05/12/23     polyethylene glycol powder (GLYCOLAX /MIRALAX ) 17 GM/SCOOP powder Mix and drink  17 g by mouth daily. 07/13/22     prednisoLONE  acetate (PRED FORTE ) 1 % ophthalmic suspension Place 1 drop into the right eye 4 (four) times daily. (Start 3 hours after you arrive home from surgery) 05/12/23     SitaGLIPtin -MetFORMIN  HCl (JANUMET  XR) [phone] MG TB24 Take 1 tablet by mouth daily. 10/06/23   Leontine Lapine, MD  blood glucose meter kit and supplies KIT Dispense based on patient and insurance preference. Use up to four times daily  as directed. (FOR ICD-9 250.00, 250.01). Patient not taking: Reported on 01/25/2020 05/21/18 01/25/20  Angiulli, Toribio PARAS, PA-C  omega-3 acid ethyl esters (LOVAZA ) 1 g capsule Take 1 capsule (1 g total) by mouth daily. 06/05/20 06/11/20  Dann Candyce RAMAN, MD     Allergies: Patient has no known allergies.   Review of Systems   ROS as per HPI  Physical Exam Updated Vital Signs BP 116/62 (BP Location: Right Arm)   Pulse (!) 57   Temp 98.1 F (36.7 C) (Oral)   Resp 18   SpO2 100%  Physical Exam Vitals and nursing  note reviewed.  Constitutional:      General: He is not in acute distress.    Appearance: Normal appearance.  HENT:     Head: Normocephalic and atraumatic.  Eyes:     Extraocular Movements: Extraocular movements intact.  Cardiovascular:     Rate and Rhythm: Normal rate.     Pulses: Normal pulses.  Pulmonary:     Effort: Pulmonary effort is normal.  Skin:    General: Skin is warm and dry.     Capillary Refill: Capillary refill takes less than 2 seconds.  Neurological:     General: No focal deficit present.     Mental Status: He is alert and oriented to person, place, and time.     Cranial Nerves: No cranial nerve deficit.     Sensory: No sensory deficit.     Motor: Weakness (4+/5 strength in right upper extremity) present.     Gait: Gait abnormal (Mild right-sided limp).     ED Course/ Medical Decision Making/ A&P  Procedures Procedures   Medications Ordered in ED Medications - No data to display  Medical Decision Making:   Arthur Howard is a 58 y.o. male who presents for weakness as per above.  Physical exam is pertinent for mild right-sided limp, no lower extremity weakness on strength testing, 4+/5 strength in RUE.   The differential includes but is not limited to stroke, recrudescence, ICH, infection.  Independent historian: Spouse/partner  External data reviewed: Notes: Reviewed outside radiology and labs from [facility], as well as notes from [facility]: Ordered, Independent interpretation, and Details: UA  Radiology: Ordered, Independent interpretation, Details: Personally reviewed the patient's MRI of the brain, I do appreciate a hyperintensity in the left basal ganglia, and All images reviewed independently.  Agree with radiology report at this time.   MR BRAIN WO CONTRAST Result Date: 12/29/2023 EXAM: MRI BRAIN WITHOUT CONTRAST 12/29/2023 09:32:19 AM TECHNIQUE: Multiplanar multisequence MRI of the head/brain was performed without the  administration of intravenous contrast. COMPARISON: CT yesterday and brain MRI 05/06/2018. CLINICAL HISTORY: 58 year old male. Stroke, follow up. FINDINGS: BRAIN AND VENTRICLES: Linear restricted diffusion tracking from the left corona radiata into the posterior left uncinate fasciculus measuring about 16 mm long axis with T2 and FLAIR hyperintensity. No associated hemorrhage or mass effect. No other restricted diffusion. No intracranial hemorrhage. No mass. No midline shift. No hydrocephalus. Expected evolution of left pontine lacunar infarct since 2020, small area of cystic and subchondral malacia there now. Chronic lacunar infarcts of the right basal ganglia are stable since 2020. Overall cerebral volume remains normal for age. Scattered subcortical white matter T2 and FLAIR hyperintensity is moderate for age and mostly affecting the frontal lobes. No cortical encephalomalacia identified. No chronic cerebral blood products on SWI. There is Wallerian degeneration in the left lower brainstem below the chronic pontine infarct. The sella is  unremarkable. Normal flow voids. ORBITS: Postoperative changes to both globes since 2020. SINUSES AND MASTOIDS: No acute abnormality. BONES AND SOFT TISSUES: Normal marrow signal. No acute soft tissue abnormality. IMPRESSION: 1. Acute lacunar infarct left corona radiata and posterior left lentiform, no hemorrhage or mass effect. 2. Chronic left pontine lacunar infarct with expected evolution and associated Wallerian degeneration. 3. Chronic lacunar infarcts of the right basal ganglia, chronic frontal lobe white matter changes. Electronically signed by: Helayne Hurst MD 12/29/2023 09:40 AM EDT RP Workstation: [id number]   CT HEAD WO CONTRAST Result Date: 12/28/2023 EXAM: CT HEAD WITHOUT CONTRAST 12/28/2023 09:33:22 PM TECHNIQUE: CT of the head was performed without the administration of intravenous contrast. Automated exposure control, iterative reconstruction, and/or weight based  adjustment of the mA/kV was utilized to reduce the radiation dose to as low as reasonably achievable. COMPARISON: 05/06/2018 CLINICAL HISTORY: Transient ischemic attack (TIA). Pt here for right-sided weakness. Pt states he feels weak from the elbow down and calf down to foot. Pt has hx of stroke in 04/2018. Weakness started yesterday at 2030. Pt states he feels pain in right buttocks 3/10. FINDINGS: BRAIN AND VENTRICLES: No acute hemorrhage. No evidence of acute infarct. No hydrocephalus. No extra-axial collection. No mass effect or midline shift. ORBITS: No acute abnormality. SINUSES: No acute abnormality. SOFT TISSUES AND SKULL: No acute soft tissue abnormality. No skull fracture. IMPRESSION: 1. No acute intracranial abnormality. Electronically signed by: Franky Stanford MD 12/28/2023 09:43 PM EDT RP Workstation: [id number]    EKG/Medicine tests: Repeat EKG not indicated EKG Interpretation: Sinus rhythm Minimal ST elevation, inferior leads No significant change since last tracing Confirmed by Roselyn Dunnings [phone]) on 12/29/2023 4:13:47 AM  Interventions: None   See the EMR for full details regarding lab and imaging results.  Patient presents for worsening of reported baseline deficits, patient does have mild weakness in right hemibody on exam and mild limp.  Prior blood work as well as CT head were reassuring, will obtain UA to evaluate for UTI as this can promote recrudescence, additionally will obtain MRI.  UA equivocal for UTI, rare bacteria and small LE, negative for nitrites, no significant WBCs, no will not treat for UTI at this time.  MRI unfortunately does demonstrate acute lacunar stroke, discussed with Dr. Matthews who recommends admission to the hospitalist service and that neurology will consult and provide recommendations.  Internal medicine consulted for admission, discussed with Dr. Norrine, who accepted patient.  Presentation is most consistent with acute complicated illness  Discussion  of management or test interpretations with external provider(s): Dr. Matthews, neurology, Dr. Norrine, Hospitalists  Risk Drugs:None Treatment: Decision regarding hospitalization  Disposition: ADMIT: I believe the patient requires admission for further care and management. The patient was admitted to internal medicine with neurology consult. Please see inpatient provider note for additional treatment plan details.   MDM generated using voice dictation software and may contain dictation errors.  Please contact me for any clarification or with any questions.  Clinical Impression:  1. Right sided weakness   2. Lacunar infarct, acute (HCC)      Admit   Final Clinical Impression(s) / ED Diagnoses Final diagnoses:  Right sided weakness  Lacunar infarct, acute [facility])    Rx / DC Orders ED Discharge Orders     None        Rogelia Jerilynn RAMAN, MD 12/29/23 1650

## 2023-12-29 NOTE — Progress Notes (Incomplete)
 Internal Medicine Teaching Service Attending Note Date: 12/29/2023  Patient name: Arthur Howard  Medical record number: [id number]  Date of birth: 1966/01/10   I have seen and evaluated Arthur Howard and discussed their care with the Residency Team.   58 yo Hispanic M with hx HTN, DM, hyperlipidemia, and of previous R sided CVA (Feb 2020). Since he has persistent weakness in his RUE and especially his hand.  Beginning 10-5 he noted increased weakness in his R forearm and hand as well as RLE from knee to his foot. He was seen in [facility] ED and had CT head (-) prior to leaving and coming to [facility].  He was found on MRI to have acute lacunar, pontine infarcts as well as previous infarcts.  His UA is notable for for Glc > 500, A1C 6.6%. ECG sinus brady.   Physical Exam: Blood pressure 126/78, pulse 67, temperature 97.9 F (36.6 C), temperature source Oral, resp. rate 18, SpO2 100%. General appearance: alert, cooperative, and no distress Eyes: negative findings: pupils equal, round, reactive to light and accomodation Throat: normal findings: oropharynx pink & moist without lesions or evidence of thrush Neck: no adenopathy and supple, symmetrical, trachea midline Resp: clear to auscultation bilaterally Cardio: regular rate and rhythm GI: normal findings: bowel sounds normal and soft, non-tender Extremities: edema none Neurologic: Sensory: normal Motor: mild decrease in plantar and grip strength in foot and hand on R.   Lab results: Results for orders placed or performed during the hospital encounter of 12/29/23 (from the past 24 hours)  Urinalysis, w/ Reflex to Culture (Infection Suspected) -Urine, Clean Catch     Status: Abnormal   Collection Time: 12/29/23  9:06 AM  Result Value Ref Range   Specimen Source URINE, CLEAN CATCH    Color, Urine YELLOW YELLOW   APPearance CLEAR CLEAR   Specific Gravity, Urine 1.018 1.005 - 1.030   pH 5.0 5.0 - 8.0   Glucose, UA >=500 (A) NEGATIVE mg/dL    Hgb urine dipstick NEGATIVE NEGATIVE   Bilirubin Urine NEGATIVE NEGATIVE   Ketones, ur NEGATIVE NEGATIVE mg/dL   Protein, ur NEGATIVE NEGATIVE mg/dL   Nitrite NEGATIVE NEGATIVE   Leukocytes,Ua SMALL (A) NEGATIVE   RBC / HPF 0-5 0 - 5 RBC/hpf   WBC, UA 0-5 0 - 5 WBC/hpf   Bacteria, UA RARE (A) NONE SEEN   Squamous Epithelial / HPF 0-5 0 - 5 /HPF   Mucus PRESENT   Lipid panel     Status: Abnormal   Collection Time: 12/29/23 12:16 PM  Result Value Ref Range   Cholesterol 123 0 - 200 mg/dL   Triglycerides 77 <849 mg/dL   HDL 39 (L) >59 mg/dL   Total CHOL/HDL Ratio 3.2 RATIO   VLDL 15 0 - 40 mg/dL   LDL Cholesterol 69 0 - 99 mg/dL  Hemoglobin J8r     Status: Abnormal   Collection Time: 12/29/23  1:57 PM  Result Value Ref Range   Hgb A1c MFr Bld 6.6 (H) 4.8 - 5.6 %   Mean Plasma Glucose 142.72 mg/dL    Imaging results:  MR BRAIN WO CONTRAST Result Date: 12/29/2023 EXAM: MRI BRAIN WITHOUT CONTRAST 12/29/2023 09:32:19 AM TECHNIQUE: Multiplanar multisequence MRI of the head/brain was performed without the administration of intravenous contrast. COMPARISON: CT yesterday and brain MRI 05/06/2018. CLINICAL HISTORY: 58 year old male. Stroke, follow up. FINDINGS: BRAIN AND VENTRICLES: Linear restricted diffusion tracking from the left corona radiata into the posterior left uncinate fasciculus  measuring about 16 mm long axis with T2 and FLAIR hyperintensity. No associated hemorrhage or mass effect. No other restricted diffusion. No intracranial hemorrhage. No mass. No midline shift. No hydrocephalus. Expected evolution of left pontine lacunar infarct since 2020, small area of cystic and subchondral malacia there now. Chronic lacunar infarcts of the right basal ganglia are stable since 2020. Overall cerebral volume remains normal for age. Scattered subcortical white matter T2 and FLAIR hyperintensity is moderate for age and mostly affecting the frontal lobes. No cortical encephalomalacia identified.  No chronic cerebral blood products on SWI. There is Wallerian degeneration in the left lower brainstem below the chronic pontine infarct. The sella is unremarkable. Normal flow voids. ORBITS: Postoperative changes to both globes since 2020. SINUSES AND MASTOIDS: No acute abnormality. BONES AND SOFT TISSUES: Normal marrow signal. No acute soft tissue abnormality. IMPRESSION: 1. Acute lacunar infarct left corona radiata and posterior left lentiform, no hemorrhage or mass effect. 2. Chronic left pontine lacunar infarct with expected evolution and associated Wallerian degeneration. 3. Chronic lacunar infarcts of the right basal ganglia, chronic frontal lobe white matter changes. Electronically signed by: Helayne Hurst MD 12/29/2023 09:40 AM EDT RP Workstation: [id number]   CT HEAD WO CONTRAST Result Date: 12/28/2023 EXAM: CT HEAD WITHOUT CONTRAST 12/28/2023 09:33:22 PM TECHNIQUE: CT of the head was performed without the administration of intravenous contrast. Automated exposure control, iterative reconstruction, and/or weight based adjustment of the mA/kV was utilized to reduce the radiation dose to as low as reasonably achievable. COMPARISON: 05/06/2018 CLINICAL HISTORY: Transient ischemic attack (TIA). Pt here for right-sided weakness. Pt states he feels weak from the elbow down and calf down to foot. Pt has hx of stroke in 04/2018. Weakness started yesterday at 2030. Pt states he feels pain in right buttocks 3/10. FINDINGS: BRAIN AND VENTRICLES: No acute hemorrhage. No evidence of acute infarct. No hydrocephalus. No extra-axial collection. No mass effect or midline shift. ORBITS: No acute abnormality. SINUSES: No acute abnormality. SOFT TISSUES AND SKULL: No acute soft tissue abnormality. No skull fracture. IMPRESSION: 1. No acute intracranial abnormality. Electronically signed by: Franky Stanford MD 12/28/2023 09:43 PM EDT RP Workstation: [id number]    Assessment and Plan: I agree with the formulated Assessment and  Plan with the following changes:  Acute lacunar infarct He was given asa and plavix .  Will continue his statin (cholesterol at goal) BP is well controlled.  Neuro checks Carotid CT/vascular Appreciate neuro f/u  DM2 Continue entresto (his Cr is normal) SSI  Hyperlipidemia Continue statin At goal LFTs normal  Eben, Reyes BROCKS, MD

## 2023-12-29 NOTE — Hospital Course (Signed)
 Arthur Howard is a 58 y.o. with history of CVA with right-sided weakness as late effect who presents with right sided weakness and slurred speech and was admitted for workup of an acute lacunar infarct.      Acute cerebral infarction  Patient presented to the ED on 10/7 with right sided arm and leg weakness with slurred speech since 10/5. Has a history of small vessel disease and stroke in 2020 with sustained right sided weakness and word finding difficulties. MRI of the head demonstrated an acute lacunar infarct of the left corona radiata and posterior left lentiform with choric left pontine lacunar infarct and chronic lacunar infarcts of the right basal ganglia and frontal lobe. Patient was not in treatment window for TNK or LVO. Patient lipid profile is WNL and A1C was 6.6%. Patient was started on 81 mg Aspirin , 75 mg Plavix  and his home dose of 40 mg Atorvastatin  was increased to 80 mg. Neurology recommended CTA head and neck, TTE and Aspirin  and Plavix  for 21 days followed by just Plavix . CTA head and neck demonstrated no large vessel or proximal hemodynamically significantly stenosis and a small (1-2 mm) posteriorly directed outpouching at the left MCA bifurcation, comp with infundibulum or aneurysm. TTE was unremarkable, however was unable to exclude ASD/PFO. Patient currently clinically stable. Continues to have residual right arm and leg weakness in addition to speech difficulty that is improving. PT/OT recommending outpatient follow up. Medically cleared for discharge.

## 2023-12-29 NOTE — ED Notes (Signed)
 This RN notified floor pt is coming up. Breathing is even and unlabored.

## 2023-12-29 NOTE — ED Triage Notes (Addendum)
 Pt w family, c/o R sided leg/ arm numbness, slight pain on R buttock. Advises numbness onset yesterday approx 8a, elbow down, calf down. Family advises gait disturbance, but no change in smile, not much visual change, a little change in speech.

## 2023-12-29 NOTE — H&P (Cosign Needed Addendum)
 Date: 12/29/2023         Patient Name:  Arthur Howard MRN: [id number]  DOB: 1965/09/04 Age / Sex: 58 y.o., male   PCP: Amilibia, Jaden, DO         Medical Service: Internal Medicine Teaching Service         Attending Physician: Dr. Eben Reyes BROCKS, MD    First Contact: Dr. Napoleon Pager: [phone]  Second Contact: Dr. Marylu Pager: [phone]                 Chief Concern: Right arm and leg weakness  History of Present Illness: 58 year old male came to the ED by private vehicle at the behest of his sisters after right arm and right leg weakness that started around 0800 Sunday 12/27/23. It hasn't improved very much since then. He was in his usual state of health prior to onset of symptoms.  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 Also hypertension, diabetes on insulin , and hyperlipidemia.  No recent surgeries or procedures.  He reports good adherence to medications.  He lives in [place], is disabled from working due to late effect of prior stroke, enjoys playing with his grandchildren. Doesn't smoke, drink alcohol, or use drugs.  In ED workup included brain MRI which was notable for acute-appearing lacunar infarct of left corona radiata and posterior left lentiform, plus chronic appearing right and left lacunar infarcts.  ROS per above.   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   Current Outpatient Medications on File Prior to Encounter  Medication Sig Dispense Refill   Artificial Tear Ointment (DRY EYES OP) Place 1 drop into both eyes as needed (Dry eye).     aspirin  81 MG EC tablet Take 1 tablet (81 mg total) by mouth daily. 120 tablet 1   atorvastatin  (LIPITOR ) 40 MG tablet Take 1 tablet (40 mg total) by mouth daily. 90 tablet 1   empagliflozin  (JARDIANCE ) 25 MG TABS tablet Take 1 tablet (25 mg total) by mouth daily before breakfast. 30 tablet 2   SitaGLIPtin -MetFORMIN  HCl (JANUMET  XR) [id number] MG TB24 Take 1 tablet by mouth daily. 90 tablet 3   Accu-Chek Softclix Lancets lancets Use as directed 4 (four) times daily. 100 each 12   Blood Glucose Monitoring Suppl (ACCU-CHEK GUIDE ME) w/Device KIT Use to test blood sugar with test strips. 1 kit 0   cetirizine  (ZYRTEC ) 10 MG tablet Take 1  tablet (10 mg total) by mouth daily. (Patient not taking: Reported on 12/29/2023) 30 tablet 0   glucose blood (ACCU-CHEK GUIDE) test strip Use to test blood glucose 4 (four) times daily. 100 each 12   prednisoLONE  acetate (PRED FORTE ) 1 % ophthalmic suspension Place 1 drop into the right eye 4 (four) times daily. (Start 3 hours after you arrive home from surgery) (Patient not taking: Reported on 12/29/2023) 5 mL 2   [DISCONTINUED] blood glucose meter kit and supplies KIT Dispense based on patient and insurance preference.  Use up to four times daily as directed. (FOR ICD-9 250.00, 250.01). (Patient not taking: Reported on 01/25/2020) 1 each 0   [DISCONTINUED] omega-3 acid ethyl esters (LOVAZA ) 1 g capsule Take 1 capsule (1 g total) by mouth daily. 90 capsule 3     Surgical History: Past Surgical History:  Procedure Laterality Date   ANKLE SURGERY     CLOSED REDUCTION METACARPAL WITH PERCUTANEOUS PINNING Right 07/26/2020   Procedure: CLOSED REDUCTION METACARPAL WITH PERCUTANEOUS PINNING;  Surgeon: Murrell Drivers, MD;  Location: [facility];  Service: Orthopedics;  Laterality: Right;    Family History:  Family History  Problem Relation Age of Onset   Heart disease Mother    Lung cancer Father    Diabetes Sister    Diabetes Brother    CVA Neg Hx    Colon cancer Neg Hx    Colon polyps Neg Hx    Esophageal cancer Neg Hx    Rectal cancer Neg Hx    Stomach cancer Neg Hx     Social History:  Social History   Socioeconomic History   Marital status: Married    Spouse name: Hadassah   Number of children: 7   Years of education: Not on file   Highest education level: 6th grade  Occupational History   Occupation: Network engineer    Comment: [employer]  Tobacco Use   Smoking status: Former    Types: Cigarettes   Smokeless tobacco: Never   Tobacco comments:    never a daily smoker  Vaping Use   Vaping status: Never Used  Substance and Sexual Activity   Alcohol use: No    Alcohol/week: 0.0 standard drinks of alcohol   Drug use: No   Sexual activity: Not on file  Other Topics Concern   Not on file  Social History Narrative   Works with Network engineer. Lives in [place]. Moved from [place] 35 years ago. He is married. Has 7 children (all girls).       Patient is right-handed. He lives with his wife in a one level home. He walks daily for exercise.   Social Drivers of Corporate investment banker Strain: Not on file  Food Insecurity: No Food Insecurity (03/02/2023)   Hunger Vital Sign    Worried About  Running Out of Food in the Last Year: Never true    Ran Out of Food in the Last Year: Never true  Transportation Needs: No Transportation Needs (03/02/2023)   PRAPARE - Administrator, Civil Service (Medical): No    Lack of Transportation (Non-Medical): No  Physical Activity: Not on file  Stress: Not on file  Social Connections: Not on file  Intimate Partner Violence: Not At Risk (03/02/2023)   Humiliation, Afraid, Rape, and Kick questionnaire    Fear of Current or Ex-Partner: No    Emotionally Abused: No    Physically Abused: No    Sexually Abused: No  Physical Exam: Blood pressure 126/78, pulse 67, temperature 97.9 F (36.6 C), temperature source Oral, resp. rate 18, SpO2 100%.  No distress Moist pink oral mucosa Heart rate normal, rhythm regular, no murmurs, strong radial pulses Breathing comfortably on room air, normal lung sounds anteriorly Skin warm and dry Alert, oriented, speech sounds normal, no facial asymmetry, slight left-sided tongue deviation, midline palate, normal eye movements and pupils, no sensory deficits in face, pronator drift on right, 4+/5 strength RUE, 5/5 LUE, 4+/5 R hip flexion, 5/5 L hip flexion, no hemi-neglect  EKG:  Sinus bradycardia 54 bpm stable ~1 mm ST elevation in inferior leads compared to priors  Labs: Unremarkable. A1c 6.6 LDL 69  Images and other studies: Per above.  Assessment & Plan:  Arthur Howard is a 58 y.o. with history of CVA with right-sided weakness as late effect who presents with new focal neurologic deficits and is found to have acute lacunar infarct.  Principal Problem:   Acute cerebral infarction (HCC) Lacunar type infarct. With history of small vessel disease and stroke w/ residual deficits. Uncertain about adherence to daily aspirin  for secondary prophylaxis, admission medication reconciliation suggests intermittent adherence. He is adherent to statin and SLGT-2 + Janumet . LDL 69 and A1c 6.6.  Neurology to see in consult, defer decision about further imaging and echo to them. Will monitor on telemetry although suspect recurrent stroke due to small vessel disease. Passed swallow screen, diet okay. 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 Resume SGLT-2, hold outpatient metformin . May benefit from GLP-1 for ASCVD risk reduction. -jardiance  25 mg daily    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 (HCC) Acute on chronic, with worsening of right-sided weakness. Already had disabling deficits prior to acute stroke. PT/OT to see in consult for rehab recommendations.    Elevated BP reading w/ no diagnosis of HTN BP as high as ~140/66 today, but otherwise sustained in 120/70 range. Perhaps more aggressive goal of <120 systolic would be appropriate in light of his recurrent small-vessel infarcts. Not on any antihypertensives currently.  Level of care: med-telemetry Diet: regular VTE: enoxaparin  (LOVENOX ) injection 40 mg Start: 12/29/23 1230 Code: full Surrogate: sister updated at bedside, daughter's number in chart  Signed: Ozell Kung MD 12/29/2023, 3:33 PM Pager: [phone]

## 2023-12-30 ENCOUNTER — Observation Stay (HOSPITAL_COMMUNITY)

## 2023-12-30 ENCOUNTER — Other Ambulatory Visit (HOSPITAL_COMMUNITY): Payer: Self-pay

## 2023-12-30 DIAGNOSIS — E1151 Type 2 diabetes mellitus with diabetic peripheral angiopathy without gangrene: Secondary | ICD-10-CM

## 2023-12-30 DIAGNOSIS — I6389 Other cerebral infarction: Secondary | ICD-10-CM | POA: Diagnosis not present

## 2023-12-30 DIAGNOSIS — E785 Hyperlipidemia, unspecified: Secondary | ICD-10-CM | POA: Diagnosis not present

## 2023-12-30 DIAGNOSIS — I6381 Other cerebral infarction due to occlusion or stenosis of small artery: Secondary | ICD-10-CM | POA: Diagnosis not present

## 2023-12-30 DIAGNOSIS — I69391 Dysphagia following cerebral infarction: Secondary | ICD-10-CM | POA: Diagnosis not present

## 2023-12-30 DIAGNOSIS — R29818 Other symptoms and signs involving the nervous system: Secondary | ICD-10-CM | POA: Diagnosis not present

## 2023-12-30 LAB — ECHOCARDIOGRAM COMPLETE
Area-P 1/2: 2.51 cm2
Height: 64 in
S' Lateral: 2.9 cm

## 2023-12-30 MED ORDER — STROKE: EARLY STAGES OF RECOVERY BOOK
Freq: Once | Status: DC
  Filled 2023-12-30: qty 1

## 2023-12-30 MED ORDER — IOHEXOL 350 MG/ML SOLN
75.0000 mL | Freq: Once | INTRAVENOUS | Status: AC | PRN
  Administered 2023-12-30: 75 mL via INTRAVENOUS

## 2023-12-30 MED ORDER — ATORVASTATIN CALCIUM 40 MG PO TABS
80.0000 mg | ORAL_TABLET | Freq: Every day | ORAL | 0 refills | Status: DC
  Filled 2023-12-30: qty 30, 15d supply, fill #0

## 2023-12-30 MED ORDER — ASPIRIN 81 MG PO TBEC
81.0000 mg | DELAYED_RELEASE_TABLET | Freq: Every day | ORAL | 0 refills | Status: DC
  Filled 2023-12-30: qty 21, 21d supply, fill #0

## 2023-12-30 MED ORDER — ATORVASTATIN CALCIUM 40 MG PO TABS
80.0000 mg | ORAL_TABLET | Freq: Every day | ORAL | 0 refills | Status: DC
  Filled 2023-12-30: qty 60, 30d supply, fill #0

## 2023-12-30 MED ORDER — CLOPIDOGREL BISULFATE 75 MG PO TABS
75.0000 mg | ORAL_TABLET | Freq: Every day | ORAL | 0 refills | Status: DC
  Filled 2023-12-30: qty 30, 30d supply, fill #0

## 2023-12-30 MED ORDER — ASPIRIN 81 MG PO TBEC
81.0000 mg | DELAYED_RELEASE_TABLET | Freq: Every day | ORAL | 0 refills | Status: AC
  Filled 2023-12-30: qty 21, 21d supply, fill #0

## 2023-12-30 NOTE — Progress Notes (Addendum)
 STROKE TEAM PROGRESS NOTE    INTERIM HISTORY/SUBJECTIVE  P/w right side weakness since Sunday. Daughter is at the bedside and translates for us. He states he feels better does endorse still having some right side weakness.  MRI brain with Acute lacunar infarct left corona radiata and posterior left lentiform  CT angiogram shows no large vessel stenosis or occlusion.  1 to 2 mm left MCA bifurcation infundibulum or aneurysm. CBC    Component Value Date/Time   WBC 8.6 12/28/2023 2125   RBC 5.52 12/28/2023 2125   HGB 16.5 12/28/2023 2125   HGB 16.2 09/20/2016 0926   HCT 49.9 12/28/2023 2125   HCT 47.5 09/20/2016 0926   PLT 293 12/28/2023 2125   PLT 328 09/20/2016 0926   MCV 90.4 12/28/2023 2125   MCV 92 09/20/2016 0926   MCH 29.9 12/28/2023 2125   MCHC 33.1 12/28/2023 2125   RDW 12.1 12/28/2023 2125   RDW 12.9 09/20/2016 0926   LYMPHSABS 2.7 05/12/2018 0523   LYMPHSABS 2.1 09/20/2016 0926   MONOABS 0.7 05/12/2018 0523   EOSABS 0.1 05/12/2018 0523   EOSABS 0.0 09/20/2016 0926   BASOSABS 0.0 05/12/2018 0523   BASOSABS 0.0 09/20/2016 0926    BMET    Component Value Date/Time   NA 139 12/28/2023 2125   NA 139 03/02/2023 1537   K 4.0 12/28/2023 2125   CL 102 12/28/2023 2125   CO2 23 12/28/2023 2125   GLUCOSE 158 (H) 12/28/2023 2125   BUN 19 12/28/2023 2125   BUN 25 (H) 03/02/2023 1537   CREATININE 0.81 12/28/2023 2125   CREATININE 0.88 11/10/2015 0846   CALCIUM 9.9 12/28/2023 2125   EGFR 78 03/02/2023 1537   GFRNONAA >60 12/28/2023 2125    IMAGING past 24 hours ECHOCARDIOGRAM COMPLETE Result Date: 12/30/2023    ECHOCARDIOGRAM REPORT   Patient Name:   Arthur Howard Date of Exam: 12/30/2023 Medical Rec #:  [id number]        Height:       64.0 in Accession #:    [id number]       Weight:       150.0 lb Date of Birth:  05/17/1965        BSA:          1.731 m Patient Age:    58 years         BP:           132/74 mmHg Patient Gender: M                HR:           59  bpm. Exam  Location:  Inpatient Procedure: 2D Echo, Cardiac Doppler and Color Doppler (Both Spectral and Color            Flow Doppler were utilized during procedure). Indications:    Stroke I63.9  History:        Patient has prior history of Echocardiogram examinations, most                 recent 05/07/2018.  Sonographer:    Tinnie Gosling RDCS Referring Phys: [id number] [facility] IMPRESSIONS  1. Left ventricular ejection fraction, by estimation, is 60 to 65%. The left ventricle has normal function. Left ventricular endocardial border not optimally defined to evaluate regional wall motion. Left ventricular diastolic parameters were normal.  2. Right ventricular systolic function is normal. The right ventricular size is normal. Tricuspid regurgitation signal is inadequate for assessing PA pressure.  3. The mitral valve is normal in structure. No evidence of mitral valve regurgitation. No evidence of mitral stenosis.  4. The aortic valve is normal in structure. Aortic valve regurgitation is not visualized. Aortic valve sclerosis is present, with no evidence of aortic valve stenosis.  5. The inferior vena cava is normal in size with greater than 50% respiratory variability, suggesting right atrial pressure of 3 mmHg.  6. Bubble study not performed. Comparison(s): No significant change from prior study. Conclusion(s)/Recommendation(s): Cannot exclude ASD/PFO. Consider transesophageal echocardiogram, if clinically indicated. Consider limited echo with Definity contrast for better endocardial visualization if clinically indicated. FINDINGS  Left Ventricle: Left ventricular ejection fraction, by estimation, is 60 to 65%. The left ventricle has normal function. Left ventricular endocardial border not optimally defined to evaluate regional wall motion. The left ventricular internal cavity size was normal in size. There is no left ventricular hypertrophy. Left ventricular diastolic parameters were normal. Right Ventricle: The right  ventricular size is normal. No increase in right ventricular wall thickness. Right ventricular systolic function is normal. Tricuspid regurgitation signal is inadequate for assessing PA pressure. Left Atrium: Left atrial size was normal in size. Right Atrium: Right atrial size was normal in size. Pericardium: There is no evidence of pericardial effusion. Mitral Valve: The mitral valve is normal in structure. No evidence of mitral valve regurgitation. No evidence of mitral valve stenosis. Tricuspid Valve: The tricuspid valve is normal in structure. Tricuspid valve regurgitation is trivial. No evidence of tricuspid stenosis. Aortic Valve: The aortic valve is normal in structure. Aortic valve regurgitation is not visualized. Aortic valve sclerosis is present, with no evidence of aortic valve stenosis. Pulmonic Valve: The pulmonic valve was normal in structure. Pulmonic valve regurgitation is trivial. No evidence of pulmonic stenosis. Aorta: The aortic root and ascending aorta are structurally normal, with no evidence of dilitation. Venous: The inferior vena cava is normal in size with greater than 50% respiratory variability, suggesting right atrial pressure of 3 mmHg. IAS/Shunts: The interatrial septum was not well visualized.  LEFT VENTRICLE PLAX 2D LVIDd:         4.60 cm   Diastology LVIDs:         2.90 cm   LV e' medial:    9.14 cm/s LV PW:         0.90 cm   LV E/e' medial:  6.1 LV IVS:        0.80 cm   LV e' lateral:   14.50 cm/s LVOT diam:     2.10 cm   LV E/e' lateral: 3.8 LV SV:         67 LV SV Index:   38 LVOT Area:     3.46 cm LV IVRT:       109 msec  RIGHT VENTRICLE             IVC RV S prime:     10.90 cm/s  IVC diam: 1.10 cm TAPSE (M-mode): 2.1 cm LEFT ATRIUM             Index        RIGHT ATRIUM           Index LA diam:        3.70 cm 2.14 cm/m   RA Area:     14.50 cm LA Vol (A2C):   29.5 ml 17.04 ml/m  RA Volume:   32.90 ml  19.00 ml/m LA Vol (A4C):   28.9 ml 16.69 ml/m LA Biplane Vol: 29.8 ml  17.21 ml/m  AORTIC VALVE LVOT Vmax:   88.80 cm/s LVOT Vmean:  56.900 cm/s LVOT VTI:    0.192 m  AORTA Ao Root diam: 2.70 cm Ao Asc diam:  2.80 cm MITRAL VALVE MV Area (PHT): 2.51 cm    SHUNTS MV Decel Time: 302 msec    Systemic VTI:  0.19 m MV E velocity: 55.70 cm/s  Systemic Diam: 2.10 cm MV A velocity: 65.10 cm/s MV E/A ratio:  0.86 Emeline Calender Electronically signed by Emeline Calender Signature Date/Time: 12/30/2023/10:30:51 AM    Final    CT ANGIO HEAD NECK W WO CM Result Date: 12/30/2023 EXAM: CTA Head and Neck with Intravenous Contrast. CT Head without Contrast. CLINICAL HISTORY: Neuro deficit, acute, stroke suspected. TECHNIQUE: Axial CTA images of the head and neck performed with intravenous contrast. MIP reconstructed images were created and reviewed. Axial computed tomography images of the head/brain performed without intravenous contrast. Note: Per PQRS, the description of internal carotid artery percent stenosis, including 0 percent or normal exam, is based on [place] American Symptomatic Carotid Endarterectomy Trial (NASCET) criteria. Dose reduction technique was used including one or more of the following: automated exposure control, adjustment of mA and kV according to patient size, and/or iterative reconstruction. CONTRAST: With; 75mL (iohexol  (OMNIPAQUE ) 350 MG/ML injection 75 mL IOHEXOL  350 MG/ML SOLN) COMPARISON: None provided. FINDINGS: BRAIN: No acute intraparenchymal hemorrhage. No mass lesion. Known infarct in the left basal ganglia and the posterior limb of the internal capsule is better characterized on the recent MRI. No midline shift or extra-axial collection. VENTRICLES: No hydrocephalus. ORBITS: The orbits are unremarkable. SINUSES AND MASTOIDS: The paranasal sinuses and mastoid air cells are clear. COMMON CAROTID ARTERIES: No significant stenosis. No dissection or occlusion. INTERNAL CAROTID ARTERIES: No stenosis by NASCET criteria. No dissection or occlusion. VERTEBRAL ARTERIES: No  significant stenosis. No dissection or occlusion. ANTERIOR CEREBRAL ARTERIES: No significant stenosis. No occlusion. No aneurysm. MIDDLE CEREBRAL ARTERIES: No significant stenosis. No occlusion. Small (1 to 2 mm) posterior directed outpouching at the left MCA bifurcation. POSTERIOR CEREBRAL ARTERIES: No significant stenosis. No occlusion. No aneurysm. BASILAR ARTERY: No significant stenosis. No occlusion. No aneurysm. SOFT TISSUES: No acute finding. No masses or lymphadenopathy. BONES: No acute osseous abnormality. IMPRESSION: 1. No large vessel or proximal hemodynamically significant stenosis. 2. Small (1 to 2 mm) posterior directed outpouching at the left MCA bifurcation, compatible with infundibulum or aneurysm. Electronically signed by: Gilmore Molt MD 12/30/2023 01:12 AM EDT RP Workstation: [id number]    Vitals:   12/29/23 2021 12/30/23 0501 12/30/23 0851 12/30/23 1228  BP: 116/60 132/74 119/66 118/65  Pulse: (!) 59 (!) 58 (!) 57 62  Resp: 18 16    Temp: 98.2 F (36.8 C) 97.7 F (36.5 C) 97.7 F (36.5 C)   TempSrc: Oral Oral Oral   SpO2: 98% 97% 97% 97%  Height:         PHYSICAL EXAM General:  Alert, well-nourished, well-developed middle-aged Hispanic male in no acute distress Psych:  Mood and affect appropriate for situation CV: Regular rate and rhythm on monitor Respiratory:  Regular, unlabored respirations on room air GI: Abdomen soft and nontender   NEURO:  Mental Status: AA&Ox3, patient is able to give clear and coherent history Speech/Language: speech is without dysarthria or aphasia.  Naming, repetition, fluency, and comprehension intact.  Cranial Nerves:  II: PERRL. Visual fields full.  III, IV, VI: EOMI. Eyelids elevate symmetrically.  V: Sensation is intact to light touch and symmetrical to face.  VII:  subtle right facial  VIII: hearing intact to voice. IX, X: Palate elevates symmetrically. Phonation is normal.  KP:Dynloizm shrug 5/5. XII: tongue is midline  without fasciculations. Motor: 5/5 strength to left upper and lower, right arm with no drift, right hand weak and decreased fine motor skills, right leg with drift, orbits left over right upper extremity. Tone: is normal and bulk is normal Sensation- Intact to light touch bilaterally. Extinction absent to light touch to DSS.   Coordination: FTN intact bilaterally, HKS: no ataxia in BLE.No drift. Decreased fine motor skills on right  Gait- deferred   ASSESSMENT/PLAN  Arthur Howard is a 58 y.o. male with history of diabetes mellitus, hyperlipidemia, and previous CVA (05/06/2018) who presents to [facility] early this morning after leaving the [facility]. Patient presented at [facility] last night for right-sided weakness. He says that on Sunday morning (10/5) around 8:00am he began to feel weakness and numbness in his right arm and right leg. He felt like he was dragging his leg while walking. When he tried to go to his bed, his leg gave out and he fell onto the bed.  Acute Ischemic Infarct:  left corona radiata and posterior left lentiform  Etiology:   Small vessel disease CTA head & neck No LVO Small (1 to 2 mm) posterior directed outpouching at the left MCA bifurcation MRI  Acute lacunar infarct left corona radiata and posterior left lentiform  2D Echo EF 60-65%  LDL 69 HgbA1c 6.6 VTE prophylaxis - Lovenox   aspirin  81 mg daily prior to admission, now on aspirin  81 mg daily and clopidogrel  75 mg daily for 3 weeks and then Plavix   alone. Therapy recommendations:  Pending Disposition:  Pending   Hx of Stroke/TIA Chronic left pontine and right basal ganglia lacunar infarct with mild right side weakness   Hypertension Home meds:  none Stable Blood Pressure Goal: SBP less than 160   Hyperlipidemia Home meds:  atorvastatin  40mg  ,  resumed in hospital LDL 69, goal < 70 Increased to 80mg   Continue statin at discharge  Diabetes type II Controlled Home meds:  JArdiance , Janumet  100-1000mg   HgbA1c  6.6, goal < 7.0 CBGs SSI Recommend close follow-up with PCP for better DM control  Dysphagia Patient has post-stroke dysphagia, SLP consulted    Diet   Diet heart healthy/carb modified Room service appropriate? Yes; Fluid consistency: Thin   Advance diet as tolerated  Hospital day # 0   Karna Geralds DNP, ACNPC-AG  [facility]  I have personally obtained history,examined this patient, reviewed notes, independently viewed imaging studies, participated in medical decision making and plan of care.ROS completed by me personally and pertinent positives fully documented  I have made any additions or clarifications directly to the above note. Agree with note above.  Patient presented with right-sided weakness secondary to left subcortical lacunar infarct likely from small vessel disease.  Recommend continuing ongoing stroke workup.  Aggressive risk factor modification.  Aspirin  and Plavix  for 3 weeks followed by Plavix  alone.  On discussion with patient, daughter and wife at the bedside and answered questions.   I personally spent a total of 50 minutes in the care of the patient today including getting/reviewing separately obtained history, performing a medically appropriate exam/evaluation, counseling and educating, placing orders, referring and communicating with other health care professionals, documenting clinical information in the EHR, independently interpreting results, and coordinating care.        Eather Popp, MD Medical Director [facility] Pager: [phone] 12/30/2023 5:08  PM   To contact Stroke Continuity provider, please refer to [email or web address]. After hours, contact General Neurology

## 2023-12-30 NOTE — Progress Notes (Signed)
 Discharge:  Pt ok with daughter receiving discharge information.  Refused interpreter.  He speaks and understand minimal amount of basic English.  Tele removed CCMD/Krizzel.  PIV removed dressing intact.  Stroke booklet Spanish version given to family.  Daughter verbally understands discharge instruction.  Extensive stroke education provided by Autumn RN.  TOC meds at bedside.   Transporting to Main A.

## 2023-12-30 NOTE — Discharge Instructions (Addendum)
 Arthur Howard,  You were recently admitted to [facility] for treatment of a stroke in your brain. You were given Aspirin  81 mg and Plavix  75 mg to prevent further strokes. We also increased your Atorvastatin  dose to 80 mg, which will help to control your cholesterol levels which will reduce your chances of a future stroke.   Continue taking your home medications with the following changes  Please start on Aspirin  81 mg and Plavix  75 mg once a day with the last dose on 10/28. From then on, please only take Plavix  75 mg once a day.  We have increased the dose of your Atorvastatin  to 80 mg. Please continue to take once a day.    You should seek further medical care if you develop further weakness, speech difficulties, changes in vision, fevers, chest pain or any new concerning or worsening symptoms.  We have scheduled a follow up appointment with your primary care [facility] with Dr Elicia on 10/23 at 8:45 AM.   We are so glad that you are feeling better.  Sincerely, Ora Schuller, MS4

## 2023-12-30 NOTE — Care Management Obs Status (Signed)
 MEDICARE OBSERVATION STATUS NOTIFICATION   Patient Details  Name: Arthur Howard MRN: [id number] Date of Birth: [birth year]   Medicare Observation Status Notification Given:  Yes  Verbally reviewed observation notice with Halbert Klippel telephonically at [phone].  Will deliver a copy to the patients room.  [name] 12/30/2023, 9:16 AM

## 2023-12-30 NOTE — Evaluation (Signed)
 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 All education has been completed and the patient has no further questions. See below for any follow-up Occupational Therapy or equipment needs. OT to sign off. Thank you for referral.       If plan is discharge home, recommend the following:         Functional Status Assessment   Patient has had a recent decline in their functional status and demonstrates the ability to make significant improvements in function in a reasonable and predictable amount of time.     Equipment Recommendations   None recommended by OT     Recommendations for Other Services         Precautions/Restrictions   Precautions Precautions: None Recall of Precautions/Restrictions: Intact Restrictions Weight Bearing Restrictions Per Provider Order: No     Mobility Bed Mobility Overal bed mobility: Modified Independent             General bed mobility comments: exiting R side with increased time    Transfers Overall transfer level: Needs assistance   Transfers: Sit to/from Stand Sit to Stand: Modified independent (Device/Increase time)           General transfer comment: pt bracing against bed but able to power up pt without physical (A) to ambulate      Balance Overall balance assessment: Mild deficits observed, not formally tested                                         ADL either performed or assessed with clinical judgement   ADL Overall  ADL's : At baseline                                       General ADL Comments: pt needs increased time but able to don socks at eob with bil UE. pt placing the telemetry box in his R pocket. pt demosntrates tub transfer and how he static stands to dress and shower. pt educated on energy conservation and safety to sit for adls as much as possible. pt expressed understanding. wife present and agreeable to help with bathing task as needed.     Vision Baseline Vision/History: 5 Retinopathy Ability to See in Adequate Light: 1 Impaired Patient Visual Report: No change from baseline       Perception         Praxis         Pertinent Vitals/Pain       Extremity/Trunk Assessment Upper Extremity Assessment Upper Extremity Assessment: Right hand dominant;RUE deficits/detail RUE Deficits / Details: pt reports numbness and greater sensation starting at elbow to hand and more the 5th digit vs thumb . pt demonstrates hand to mouth. pt holding R UE in a extended adduction supination position ( slightly behind R hip area when walking). pt was able to move out of the  pattern when asked to move with R hand on his hip and sustain the position. RUE Sensation: decreased light touch RUE Coordination: decreased gross motor;decreased fine motor   Lower Extremity Assessment Lower Extremity Assessment: Defer to PT evaluation;RLE deficits/detail RLE Deficits / Details: noted to have some foot drop . daughter confirms that patient had prior recommendation for possilbe splint need. pt could benefit from outpatient follow up for possible AFO needs to help with fatigue with ambulation. pt reports fatigue after 10 minutes and needing to sit down   Cervical / Trunk Assessment Cervical / Trunk Assessment: Normal   Communication Communication Communication: Other (comment) Factors Affecting Communication: Non - English speaking, interpreter not available (daughter present translating)    Cognition Arousal: Alert Behavior During Therapy: WFL for tasks assessed/performed Cognition: No apparent impairments                               Following commands: Intact       Cueing  General Comments      BP 133/85 with movement   Exercises     Shoulder Instructions      Home Living Family/patient expects to be discharged to:: Private residence Living Arrangements: Spouse/significant other;Children Available Help at Discharge: Family Type of Home: Mobile home Home Access: Stairs to enter Entrance Stairs-Number of Steps: 4  (planning a move back to previous house without steps) Entrance Stairs-Rails: Left;Right Home Layout: One level     Bathroom Shower/Tub: Walk-in shower;Tub/shower unit   Bathroom Toilet: Standard     Home Equipment: None   Additional Comments: moving back to prior mobile home this weekend. has 9 family members that can give 24/7      Prior Functioning/Environment Prior Level of Function : Independent/Modified Independent               ADLs Comments: does some IADL like sweeping, has a walking pad (like a treadmill) but he does not use it; does try to mow the grass though after 10 minutes R leg gets weak and drags    OT Problem List:     OT Treatment/Interventions:        OT Goals(Current goals can be found in the care plan section)   Acute Rehab OT Goals Patient Stated Goal: to not have more strokes Potential to Achieve Goals: Good   OT Frequency:       Co-evaluation              AM-PAC OT 6 Clicks Daily Activity     Outcome Measure Help from another person eating meals?: None Help from another person taking care of personal grooming?: None Help from another person toileting, which includes using toliet, bedpan, or urinal?: None Help from another person bathing (including washing, rinsing, drying)?: None Help from another person to put on and taking off regular upper body clothing?: None Help  from another person to put on and taking off regular lower body clothing?: None 6 Click Score: 24   End of Session Equipment Utilized During Treatment: Gait belt Nurse Communication: Mobility status;Precautions  Activity Tolerance: Patient tolerated treatment well Patient left: in bed;with call bell/phone within reach;with family/visitor present  OT Visit Diagnosis: Unsteadiness on feet (R26.81)                Time: 8896-8870 OT Time Calculation (min): 26 min Charges:  OT General Charges $OT Visit: 1 Visit OT Evaluation $OT Eval Moderate Complexity: 1 Mod  Ely, OTR/L  Acute Rehabilitation Services Office: [phone] .   Ely Molt 12/30/2023, 11:42 AM

## 2023-12-30 NOTE — Progress Notes (Signed)
  Echocardiogram 2D Echocardiogram has been performed.  Tinnie FORBES Gosling RDCS 12/30/2023, 10:12 AM

## 2023-12-30 NOTE — Progress Notes (Signed)
 HD#0 SUBJECTIVE:  Patient Summary: Arthur Howard is a 58 y.o. with history of CVA with right-sided weakness as late effect who presents with right sided weakness and slurred speech and was admitted for workup of an acute lacunar infarct.   Overnight Events: None  Interim History: Patient states he is doing much better. States weakness and slurred speech are improved but not to pre-stroke levels.   OBJECTIVE:  Vital Signs: Vitals:   12/29/23 1744 12/29/23 2021 12/30/23 0501 12/30/23 0851  BP: (!) 147/65 116/60 132/74 119/66  Pulse: 60 (!) 59 (!) 58 (!) 57  Resp: 18 18 16    Temp: (!) 97.4 F (36.3 C) 98.2 F (36.8 C) 97.7 F (36.5 C) 97.7 F (36.5 C)  TempSrc: Oral Oral Oral Oral  SpO2: 98% 98% 97% 97%  Height:       Supplemental O2: Room Air SpO2: 97 %  There were no vitals filed for this visit.  No intake or output data in the 24 hours ending 12/30/23 1127 Net IO Since Admission: No IO data has been entered for this period [12/30/23 1127]  Physical Exam: Physical Exam HENT:     Head: Normocephalic.  Eyes:     Extraocular Movements: Extraocular movements intact.     Conjunctiva/sclera: Conjunctivae normal.  Cardiovascular:     Rate and Rhythm: Normal rate and regular rhythm.     Heart sounds: Normal heart sounds. No murmur heard. Pulmonary:     Effort: Pulmonary effort is normal.     Breath sounds: Normal breath sounds.  Abdominal:     General: Abdomen is flat. There is no distension.     Tenderness: There is no abdominal tenderness.  Musculoskeletal:        General: No swelling.     Cervical back: Normal range of motion.  Neurological:     Mental Status: He is alert and oriented to person, place, and time.     Cranial Nerves: Cranial nerves 2-12 are intact.     Sensory: Sensation is intact.     Motor: Weakness present.     Comments: Right arm (4/5) and leg (4/5) weakness, improved from 10/7     Patient Lines/Drains/Airways Status     Active  Line/Drains/Airways     Name Placement date Placement time Site Days   Peripheral IV 12/29/23 20 G 1 Left;Anterior Forearm 12/29/23  0405  Forearm  1            Pertinent labs and imaging:      Latest Ref Rng & Units 12/28/2023    9:25 PM 06/15/2021   10:33 PM 01/24/2020    8:10 AM  CBC  WBC 4.0 - 10.5 K/uL 8.6  7.5  7.9   Hemoglobin 13.0 - 17.0 g/dL 83.4  85.2  84.3   Hematocrit 39.0 - 52.0 % 49.9  42.2  46.0   Platelets 150 - 400 K/uL 293  237  316        Latest Ref Rng & Units 12/28/2023    9:25 PM 03/02/2023    3:37 PM 06/04/2022    3:53 PM  CMP  Glucose 70 - 99 mg/dL 841  827  852   BUN 6 - 20 mg/dL 19  25  16    Creatinine 0.61 - 1.24 mg/dL 9.18  8.90  9.07   Sodium 135 - 145 mmol/L 139  139  140   Potassium 3.5 - 5.1 mmol/L 4.0  4.4  4.7   Chloride 98 -  111 mmol/L 102  104  99   CO2 22 - 32 mmol/L 23  22  22    Calcium  8.9 - 10.3 mg/dL 9.9  9.3  9.6   Total Protein 6.5 - 8.1 g/dL 7.4     Total Bilirubin 0.0 - 1.2 mg/dL 0.4     Alkaline Phos 38 - 126 U/L 89     AST 15 - 41 U/L 24     ALT 0 - 44 U/L 22       ECHOCARDIOGRAM COMPLETE Result Date: 12/30/2023    ECHOCARDIOGRAM REPORT   Patient Name:   Arthur Howard Date of Exam: 12/30/2023 Medical Rec #:  [id number]        Height:       64.0 in Accession #:    [id number]       Weight:       150.0 lb Date of Birth:  07/04/1965        BSA:          1.731 m Patient Age:    58 years         BP:           132/74 mmHg Patient Gender: M                HR:           59 bpm. Exam Location:  Inpatient Procedure: 2D Echo, Cardiac Doppler and Color Doppler (Both Spectral and Color            Flow Doppler were utilized during procedure). Indications:    Stroke I63.9  History:        Patient has prior history of Echocardiogram examinations, most                 recent 05/07/2018.  Sonographer:    Tinnie Gosling RDCS Referring Phys: [id number] [facility] IMPRESSIONS  1. Left ventricular ejection fraction, by estimation, is 60 to 65%. The  left ventricle has normal function. Left ventricular endocardial border not optimally defined to evaluate regional wall motion. Left ventricular diastolic parameters were normal.  2. Right ventricular systolic function is normal. The right ventricular size is normal. Tricuspid regurgitation signal is inadequate for assessing PA pressure.  3. The mitral valve is normal in structure. No evidence of mitral valve regurgitation. No evidence of mitral stenosis.  4. The aortic valve is normal in structure. Aortic valve regurgitation is not visualized. Aortic valve sclerosis is present, with no evidence of aortic valve stenosis.  5. The inferior vena cava is normal in size with greater than 50% respiratory variability, suggesting right atrial pressure of 3 mmHg.  6. Bubble study not performed. Comparison(s): No significant change from prior study. Conclusion(s)/Recommendation(s): Cannot exclude ASD/PFO. Consider transesophageal echocardiogram, if clinically indicated. Consider limited echo with Definity contrast for better endocardial visualization if clinically indicated. FINDINGS  Left Ventricle: Left ventricular ejection fraction, by estimation, is 60 to 65%. The left ventricle has normal function. Left ventricular endocardial border not optimally defined to evaluate regional wall motion. The left ventricular internal cavity size was normal in size. There is no left ventricular hypertrophy. Left ventricular diastolic parameters were normal. Right Ventricle: The right ventricular size is normal. No increase in right ventricular wall thickness. Right ventricular systolic function is normal. Tricuspid regurgitation signal is inadequate for assessing PA pressure. Left Atrium: Left atrial size was normal in size. Right Atrium: Right atrial size was normal in size. Pericardium: There is no evidence of pericardial effusion.  Mitral Valve: The mitral valve is normal in structure. No evidence of mitral valve regurgitation. No  evidence of mitral valve stenosis. Tricuspid Valve: The tricuspid valve is normal in structure. Tricuspid valve regurgitation is trivial. No evidence of tricuspid stenosis. Aortic Valve: The aortic valve is normal in structure. Aortic valve regurgitation is not visualized. Aortic valve sclerosis is present, with no evidence of aortic valve stenosis. Pulmonic Valve: The pulmonic valve was normal in structure. Pulmonic valve regurgitation is trivial. No evidence of pulmonic stenosis. Aorta: The aortic root and ascending aorta are structurally normal, with no evidence of dilitation. Venous: The inferior vena cava is normal in size with greater than 50% respiratory variability, suggesting right atrial pressure of 3 mmHg. IAS/Shunts: The interatrial septum was not well visualized.  LEFT VENTRICLE PLAX 2D LVIDd:         4.60 cm   Diastology LVIDs:         2.90 cm   LV e' medial:    9.14 cm/s LV PW:         0.90 cm   LV E/e' medial:  6.1 LV IVS:        0.80 cm   LV e' lateral:   14.50 cm/s LVOT diam:     2.10 cm   LV E/e' lateral: 3.8 LV SV:         67 LV SV Index:   38 LVOT Area:     3.46 cm LV IVRT:       109 msec  RIGHT VENTRICLE             IVC RV S prime:     10.90 cm/s  IVC diam: 1.10 cm TAPSE (M-mode): 2.1 cm LEFT ATRIUM             Index        RIGHT ATRIUM           Index LA diam:        3.70 cm 2.14 cm/m   RA Area:     14.50 cm LA Vol (A2C):   29.5 ml 17.04 ml/m  RA Volume:   32.90 ml  19.00 ml/m LA Vol (A4C):   28.9 ml 16.69 ml/m LA Biplane Vol: 29.8 ml 17.21 ml/m  AORTIC VALVE LVOT Vmax:   88.80 cm/s LVOT Vmean:  56.900 cm/s LVOT VTI:    0.192 m  AORTA Ao Root diam: 2.70 cm Ao Asc diam:  2.80 cm MITRAL VALVE MV Area (PHT): 2.51 cm    SHUNTS MV Decel Time: 302 msec    Systemic VTI:  0.19 m MV E velocity: 55.70 cm/s  Systemic Diam: 2.10 cm MV A velocity: 65.10 cm/s MV E/A ratio:  0.86 Emeline Calender Electronically signed by Emeline Calender Signature Date/Time: 12/30/2023/10:30:51 AM    Final    CT ANGIO HEAD  NECK W WO CM Result Date: 12/30/2023 EXAM: CTA Head and Neck with Intravenous Contrast. CT Head without Contrast. CLINICAL HISTORY: Neuro deficit, acute, stroke suspected. TECHNIQUE: Axial CTA images of the head and neck performed with intravenous contrast. MIP reconstructed images were created and reviewed. Axial computed tomography images of the head/brain performed without intravenous contrast. Note: Per PQRS, the description of internal carotid artery percent stenosis, including 0 percent or normal exam, is based on [place] American Symptomatic Carotid Endarterectomy Trial (NASCET) criteria. Dose reduction technique was used including one or more of the following: automated exposure control, adjustment of mA and kV according to patient size, and/or iterative reconstruction. CONTRAST:  With; 75mL (iohexol  (OMNIPAQUE ) 350 MG/ML injection 75 mL IOHEXOL  350 MG/ML SOLN) COMPARISON: None provided. FINDINGS: BRAIN: No acute intraparenchymal hemorrhage. No mass lesion. Known infarct in the left basal ganglia and the posterior limb of the internal capsule is better characterized on the recent MRI. No midline shift or extra-axial collection. VENTRICLES: No hydrocephalus. ORBITS: The orbits are unremarkable. SINUSES AND MASTOIDS: The paranasal sinuses and mastoid air cells are clear. COMMON CAROTID ARTERIES: No significant stenosis. No dissection or occlusion. INTERNAL CAROTID ARTERIES: No stenosis by NASCET criteria. No dissection or occlusion. VERTEBRAL ARTERIES: No significant stenosis. No dissection or occlusion. ANTERIOR CEREBRAL ARTERIES: No significant stenosis. No occlusion. No aneurysm. MIDDLE CEREBRAL ARTERIES: No significant stenosis. No occlusion. Small (1 to 2 mm) posterior directed outpouching at the left MCA bifurcation. POSTERIOR CEREBRAL ARTERIES: No significant stenosis. No occlusion. No aneurysm. BASILAR ARTERY: No significant stenosis. No occlusion. No aneurysm. SOFT TISSUES: No acute finding. No masses  or lymphadenopathy. BONES: No acute osseous abnormality. IMPRESSION: 1. No large vessel or proximal hemodynamically significant stenosis. 2. Small (1 to 2 mm) posterior directed outpouching at the left MCA bifurcation, compatible with infundibulum or aneurysm. 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 with history of CVA with right-sided weakness as late effect who presents with right sided weakness and slurred speech and was admitted for workup of an acute lacunar infarct.   Plan:    Acute cerebral infarction  Lacunar type infarct. With history of small vessel disease and stroke w/ residual deficits. Uncertain about adherence to daily aspirin  for secondary prophylaxis, admission medication reconciliation suggests intermittent adherence. He is adherent to statin and SLGT-2 + Janumet . LDL 69 and A1c 6.6.  -Neuro consulted: recommend TTE, Aspirin  81mg  and Plavix  75 mg for 21 days followed by 75 mg Plavix  alone  -CT Head Angio: No large vessel or proximal hemodynamically significantly stenosis. 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 Resume SGLT-2, hold outpatient metformin . May benefit from GLP-1 for ASCVD  risk reduction. 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 Already had disabling deficits prior to acute stroke.  -PT/OT following    Elevated BP reading w/ no diagnosis of HTN, Resolved BP stable and normotensive. Clinically stable. Most recent reading 118/65. Not on any outpatient antihypertensives currently.  Best Practice: Diet: Regular diet IVF: Fluids: None VTE: enoxaparin  (LOVENOX ) injection 40 mg Start: 12/29/23 1230 Code: Full  Disposition planning: Therapy Recs: None, DME: none Family Contact: Daughter, at bedside. DISPO: Anticipated discharge <24 hours to Home pending PT/OT recs.  SignatureBETHA Ora Schuller MS4 11:27 AM, 12/30/2023

## 2023-12-30 NOTE — Evaluation (Addendum)
 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  Walking with R hemiparesis no device though reporting R foot drags and cannot walk more than 10 minutes due to leg weakness/fatigue.  Currently stable for home though would benefit from outpatient PT follow up at d/c for R LE strengthening and consideration for AFO.  No further skilled PT needs while inpatient, PT signing off.         If plan is discharge home, recommend the following: Help with stairs or ramp for entrance;Assist for transportation   Can travel by private vehicle        Equipment Recommendations None recommended by PT  Recommendations for Other Services       Functional Status Assessment Patient has had a recent decline in their functional status and demonstrates the ability to make significant improvements in function in a reasonable and predictable amount of time.     Precautions / Restrictions Precautions Precautions: Fall Recall of Precautions/Restrictions: Intact      Mobility  Bed Mobility Overal bed mobility: Modified Independent                  Transfers Overall transfer level: Needs assistance   Transfers: Sit to/from Stand Sit to Stand: Supervision           General transfer comment: pt bracing against bed but able to power up pt without physical A    Ambulation/Gait Ambulation/Gait assistance: Supervision, Contact guard assist Gait Distance (Feet): 180 Feet Assistive device: None Gait Pattern/deviations: Step-through pattern, Decreased dorsiflexion - right        General Gait Details: using synergy for progression of LE noting ankle inversion and PF hitting on lateral aspect of foot and some trunk extension to initiate swing  Stairs            Wheelchair Mobility     Tilt Bed    Modified Rankin (Stroke Patients Only) Modified Rankin (Stroke Patients Only) Pre-Morbid Rankin Score: Slight disability Modified Rankin: Moderate disability     Balance Overall balance assessment: Needs assistance   Sitting balance-Leahy Scale: Good       Standing balance-Leahy Scale: Good Standing balance comment: can stand with eyes closed 10 sec, turns in circle with S                             Pertinent Vitals/Pain Pain Assessment Pain Assessment: No/denies pain    Home Living Family/patient expects to be discharged to:: Private residence Living Arrangements: Spouse/significant other;Children Available Help at Discharge: Family Type of Home: Mobile home Home Access: Stairs to enter Entrance Stairs-Rails: Lawyer of Steps: 4  (planning a move back to previous house without steps)   Home Layout: One level Home Equipment: None Additional Comments: moving back to prior mobile home this weekend. has 9 family members that can give 24/7    Prior Function Prior Level of Function : Independent/Modified Independent               ADLs Comments: does some IADL like sweeping, has a walking pad (  like a treadmill) but he does not use it; does try to mow the grass though after 10 minutes R leg gets weak and drags     Extremity/Trunk Assessment   Upper Extremity Assessment Upper Extremity Assessment: Defer to OT evaluation    Lower Extremity Assessment Lower Extremity Assessment: RLE deficits/detail RLE Deficits / Details: AROM WFL except limited ankle DF, strength hip flexion 4+/5, knee extension 4+/5, ankle DF 2-/4 RLE Sensation: decreased light touch RLE Coordination: decreased gross motor        Communication   Communication Communication: Other (comment) Factors Affecting Communication: Non - English speaking, interpreter not available (daughter present and translating)    Cognition Arousal: Alert Behavior During Therapy: WFL for tasks assessed/performed                             Following commands: Intact       Cueing       General Comments General comments (skin integrity, edema, etc.): BP EOB after sock donning 155/77 after ambulation 133/55; wife and daughter in the room and supportive    Exercises     Assessment/Plan    PT Assessment All further PT needs can be met in the next venue of care  PT Problem List Decreased mobility;Decreased strength;Impaired sensation;Impaired tone       PT Treatment Interventions      PT Goals (Current goals can be found in the Care Plan section)  Acute Rehab PT Goals PT Goal Formulation: All assessment and education complete, DC therapy    Frequency       Co-evaluation               AM-PAC PT 6 Clicks Mobility  Outcome Measure Help needed turning from your back to your side while in a flat bed without using bedrails?: None Help needed moving from lying on your back to sitting on the side of a flat bed without using bedrails?: None Help needed moving to and from a bed to a chair (including a wheelchair)?: A Little Help needed standing up from a chair using your arms (e.g., wheelchair or bedside chair)?: A Little Help needed to walk in hospital room?: A Little Help needed climbing 3-5 steps with a railing? : A Little 6 Click Score: 20    End of Session Equipment Utilized During Treatment: Gait belt Activity Tolerance: Patient tolerated treatment well Patient left: in bed;with family/visitor present   PT Visit Diagnosis: Other abnormalities of gait and mobility (R26.89)    Time: 8891-8864 PT Time Calculation (min) (ACUTE ONLY): 27 min   Charges:   PT Evaluation $PT Eval Low Complexity:  1 Low   PT General Charges $$ ACUTE PT VISIT: 1 Visit         Micheline Portal, PT Acute Rehabilitation Services Office:[phone] 12/30/2023   Montie Portal 12/30/2023, 3:51 PM

## 2023-12-30 NOTE — Discharge Summary (Addendum)
 Name: Arthur Howard MRN: [id number] DOB: [birth year] 58 y.o. PCP: Amilibia, Jaden, DO  Date of Admission: 12/29/2023  3:59 AM Date of Discharge: 12/30/2023  Attending Physician: Dr. Reyes Fenton  Discharge Diagnosis: Principal Problem:   Acute cerebral infarction [facility]) Active Problems:   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 without macular edema, without long-term current use of insulin  (HCC)   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 (HCC)   Right sided weakness   Elevated BP reading w/ no diagnosis of HTN   Discharge Medications: Allergies as of 12/30/2023   No Known Allergies      Medication List     STOP taking these medications    cetirizine  10 MG tablet Commonly known as: ZYRTEC    prednisoLONE  acetate 1 % ophthalmic suspension Commonly known as: PRED FORTE        TAKE these medications    Accu-Chek Guide test strip Generic drug: glucose blood Use to test blood glucose 4 (four) times daily.   Accu-Chek Guide w/Device Kit Use to test blood sugar with test strips.   Accu-Chek Softclix Lancets lancets Use as directed 4 (four) times daily.   aspirin  EC 81 MG tablet Take 1 tablet (81 mg total) by mouth daily for 21 days.   atorvastatin  40 MG tablet Commonly known as: Lipitor  Take 2 tablets (80 mg total) by mouth daily. What changed: how much to take   clopidogrel  75 MG tablet Commonly known as: PLAVIX  Tome 1 tableta (75 mg en total) por va oral diariamente. (Take 1 tablet (75 mg total) by mouth daily.)   DRY EYES OP Place 1 drop into both eyes as needed (Dry eye).   Janumet  XR [phone] MG Tb24 Generic drug: SitaGLIPtin -MetFORMIN  HCl Tome 1 tableta por va oral diariamente. (Take 1 tablet by mouth daily.)   Jardiance  25 MG Tabs tablet Generic drug: empagliflozin  Tome 1 tableta (25 mg en total) por va oral diariamente antes de desayunar. (Take 1 tablet (25 mg  total) by mouth daily before breakfast.)        Disposition and follow-up:   [name] was discharged from [facility] in Stable condition.  At the hospital follow up visit please address:  1.  Acute cerebral infarction  a. Patient has had an acute lacunar ischemic stroke. Ensure patient is adherent with his Asprin and Plavix  for 21 days followed by just Plavix .     b. Given right sided arm and leg weakness, please ensure patient has follow up with PT/OT    C. Assess glucose control, hypertension and address other modifiable risk factors as needed.   2.  Labs / imaging needed at time of follow-up: None  3.  Pending labs/ test needing follow-up: None   Follow-up Appointments:  Follow-up Information     Elicia Sharper, DO Follow up on 01/14/2024.   Specialty: Internal Medicine Why: at 8:45 Contact information: [address] [place] [address] [phone]                 Hospital Course by problem list: Arthur Howard is a 58 y.o. with history of CVA with right-sided weakness as late effect who presents with right sided weakness and slurred speech and was admitted for workup of an acute lacunar infarct.      Acute cerebral infarction  Patient presented to the ED on 10/7 with right sided arm and leg weakness with slurred  speech since 10/5. Has a history of small vessel disease and stroke in 2020 with sustained right sided weakness and word finding difficulties. MRI of the head demonstrated an acute lacunar infarct of the left corona radiata and posterior left lentiform with choric left pontine lacunar infarct and chronic lacunar infarcts of the right basal ganglia and frontal lobe. Patient was not in treatment window for TNK or LVO. Patient lipid profile is WNL and A1C was 6.6%. Patient was started on 81 mg Aspirin , 75 mg Plavix  and his home dose of 40 mg Atorvastatin  was increased to 80 mg. Neurology recommended CTA head and  neck, TTE and Aspirin  and Plavix  for 21 days followed by just Plavix . CTA head and neck demonstrated no large vessel or proximal hemodynamically significantly stenosis and a small (1-2 mm) posteriorly directed outpouching at the left MCA bifurcation, comp with infundibulum or aneurysm. TTE was unremarkable, however was unable to exclude ASD/PFO. Patient currently clinically stable. Continues to have residual right arm and leg weakness in addition to speech difficulty that is improving. PT/OT recommending outpatient follow up. Medically cleared for discharge.       Discharge Subjective: Patient states he is doing much better. States weakness and slurred speech are improved but not to pre-stroke levels.   Discharge Exam:   BP 118/65 (BP Location: Right Arm)   Pulse 62   Temp 97.7 F (36.5 C) (Oral)   Resp 16   Ht 5' 4 (1.626 m)   SpO2 97%   BMI 25.75 kg/m  HENT:     Head: Normocephalic.  Eyes:     Extraocular Movements: Extraocular movements intact.     Conjunctiva/sclera: Conjunctivae normal.  Cardiovascular:     Rate and Rhythm: Normal rate and regular rhythm.     Heart sounds: Normal heart sounds. No murmur heard. Pulmonary:     Effort: Pulmonary effort is normal.     Breath sounds: Normal breath sounds.  Abdominal:     General: Abdomen is flat. There is no distension.     Tenderness: There is no abdominal tenderness.  Musculoskeletal:        General: No swelling.     Cervical back: Normal range of motion.  Neurological:     Mental Status: He is alert and oriented to person, place, and time.     Cranial Nerves: Cranial nerves 2-12 are intact.     Sensory: Sensation is intact.     Motor: Weakness present.     Comments: Right arm (4/5) and leg (4/5) weakness, improved from 10/7  Pertinent Labs, Studies, and Procedures:     Latest Ref Rng & Units 12/28/2023    9:25 PM 06/15/2021   10:33 PM 01/24/2020    8:10 AM  CBC  WBC 4.0 - 10.5 K/uL 8.6  7.5  7.9   Hemoglobin 13.0 -  17.0 g/dL 83.4  85.2  84.3   Hematocrit 39.0 - 52.0 % 49.9  42.2  46.0   Platelets 150 - 400 K/uL 293  237  316        Latest Ref Rng & Units 12/28/2023    9:25 PM 03/02/2023    3:37 PM 06/04/2022    3:53 PM  CMP  Glucose 70 - 99 mg/dL 841  827  852   BUN 6 - 20 mg/dL 19  25  16    Creatinine 0.61 - 1.24 mg/dL 9.18  8.90  9.07   Sodium 135 - 145 mmol/L 139  139  140  Potassium 3.5 - 5.1 mmol/L 4.0  4.4  4.7   Chloride 98 - 111 mmol/L 102  104  99   CO2 22 - 32 mmol/L 23  22  22    Calcium  8.9 - 10.3 mg/dL 9.9  9.3  9.6   Total Protein 6.5 - 8.1 g/dL 7.4     Total Bilirubin 0.0 - 1.2 mg/dL 0.4     Alkaline Phos 38 - 126 U/L 89     AST 15 - 41 U/L 24     ALT 0 - 44 U/L 22       ECHOCARDIOGRAM COMPLETE Result Date: 12/30/2023    ECHOCARDIOGRAM REPORT   Patient Name:   GODRIC LAVELL Date of Exam: 12/30/2023 Medical Rec #:  [id number]        Height:       64.0 in Accession #:    [id number]       Weight:       150.0 lb Date of Birth:  24-Nov-1965        BSA:          1.731 m Patient Age:    58 years         BP:           132/74 mmHg Patient Gender: M                HR:           59 bpm. Exam Location:  Inpatient Procedure: 2D Echo, Cardiac Doppler and Color Doppler (Both Spectral and Color            Flow Doppler were utilized during procedure). Indications:    Stroke I63.9  History:        Patient has prior history of Echocardiogram examinations, most                 recent 05/07/2018.  Sonographer:    Tinnie Gosling RDCS Referring Phys: [id number] [facility] IMPRESSIONS  1. Left ventricular ejection fraction, by estimation, is 60 to 65%. The left ventricle has normal function. Left ventricular endocardial border not optimally defined to evaluate regional wall motion. Left ventricular diastolic parameters were normal.  2. Right ventricular systolic function is normal. The right ventricular size is normal. Tricuspid regurgitation signal is inadequate for assessing PA pressure.  3. The mitral valve  is normal in structure. No evidence of mitral valve regurgitation. No evidence of mitral stenosis.  4. The aortic valve is normal in structure. Aortic valve regurgitation is not visualized. Aortic valve sclerosis is present, with no evidence of aortic valve stenosis.  5. The inferior vena cava is normal in size with greater than 50% respiratory variability, suggesting right atrial pressure of 3 mmHg.  6. Bubble study not performed. Comparison(s): No significant change from prior study. Conclusion(s)/Recommendation(s): Cannot exclude ASD/PFO. Consider transesophageal echocardiogram, if clinically indicated. Consider limited echo with Definity contrast for better endocardial visualization if clinically indicated. FINDINGS  Left Ventricle: Left ventricular ejection fraction, by estimation, is 60 to 65%. The left ventricle has normal function. Left ventricular endocardial border not optimally defined to evaluate regional wall motion. The left ventricular internal cavity size was normal in size. There is no left ventricular hypertrophy. Left ventricular diastolic parameters were normal. Right Ventricle: The right ventricular size is normal. No increase in right ventricular wall thickness. Right ventricular systolic function is normal. Tricuspid regurgitation signal is inadequate for assessing PA pressure. Left Atrium: Left atrial size was normal in size. Right Atrium:  Right atrial size was normal in size. Pericardium: There is no evidence of pericardial effusion. Mitral Valve: The mitral valve is normal in structure. No evidence of mitral valve regurgitation. No evidence of mitral valve stenosis. Tricuspid Valve: The tricuspid valve is normal in structure. Tricuspid valve regurgitation is trivial. No evidence of tricuspid stenosis. Aortic Valve: The aortic valve is normal in structure. Aortic valve regurgitation is not visualized. Aortic valve sclerosis is present, with no evidence of aortic valve stenosis. Pulmonic  Valve: The pulmonic valve was normal in structure. Pulmonic valve regurgitation is trivial. No evidence of pulmonic stenosis. Aorta: The aortic root and ascending aorta are structurally normal, with no evidence of dilitation. Venous: The inferior vena cava is normal in size with greater than 50% respiratory variability, suggesting right atrial pressure of 3 mmHg. IAS/Shunts: The interatrial septum was not well visualized.  LEFT VENTRICLE PLAX 2D LVIDd:         4.60 cm   Diastology LVIDs:         2.90 cm   LV e' medial:    9.14 cm/s LV PW:         0.90 cm   LV E/e' medial:  6.1 LV IVS:        0.80 cm   LV e' lateral:   14.50 cm/s LVOT diam:     2.10 cm   LV E/e' lateral: 3.8 LV SV:         67 LV SV Index:   38 LVOT Area:     3.46 cm LV IVRT:       109 msec  RIGHT VENTRICLE             IVC RV S prime:     10.90 cm/s  IVC diam: 1.10 cm TAPSE (M-mode): 2.1 cm LEFT ATRIUM             Index        RIGHT ATRIUM           Index LA diam:        3.70 cm 2.14 cm/m   RA Area:     14.50 cm LA Vol (A2C):   29.5 ml 17.04 ml/m  RA Volume:   32.90 ml  19.00 ml/m LA Vol (A4C):   28.9 ml 16.69 ml/m LA Biplane Vol: 29.8 ml 17.21 ml/m  AORTIC VALVE LVOT Vmax:   88.80 cm/s LVOT Vmean:  56.900 cm/s LVOT VTI:    0.192 m  AORTA Ao Root diam: 2.70 cm Ao Asc diam:  2.80 cm MITRAL VALVE MV Area (PHT): 2.51 cm    SHUNTS MV Decel Time: 302 msec    Systemic VTI:  0.19 m MV E velocity: 55.70 cm/s  Systemic Diam: 2.10 cm MV A velocity: 65.10 cm/s MV E/A ratio:  0.86 Emeline Calender Electronically signed by Emeline Calender Signature Date/Time: 12/30/2023/10:30:51 AM    Final    CT ANGIO HEAD NECK W WO CM Result Date: 12/30/2023 EXAM: CTA Head and Neck with Intravenous Contrast. CT Head without Contrast. CLINICAL HISTORY: Neuro deficit, acute, stroke suspected. TECHNIQUE: Axial CTA images of the head and neck performed with intravenous contrast. MIP reconstructed images were created and reviewed. Axial computed tomography images of the head/brain  performed without intravenous contrast. Note: Per PQRS, the description of internal carotid artery percent stenosis, including 0 percent or normal exam, is based on [place] American Symptomatic Carotid Endarterectomy Trial (NASCET) criteria. Dose reduction technique was used including one or more of the following: automated  exposure control, adjustment of mA and kV according to patient size, and/or iterative reconstruction. CONTRAST: With; 75mL (iohexol  (OMNIPAQUE ) 350 MG/ML injection 75 mL IOHEXOL  350 MG/ML SOLN) COMPARISON: None provided. FINDINGS: BRAIN: No acute intraparenchymal hemorrhage. No mass lesion. Known infarct in the left basal ganglia and the posterior limb of the internal capsule is better characterized on the recent MRI. No midline shift or extra-axial collection. VENTRICLES: No hydrocephalus. ORBITS: The orbits are unremarkable. SINUSES AND MASTOIDS: The paranasal sinuses and mastoid air cells are clear. COMMON CAROTID ARTERIES: No significant stenosis. No dissection or occlusion. INTERNAL CAROTID ARTERIES: No stenosis by NASCET criteria. No dissection or occlusion. VERTEBRAL ARTERIES: No significant stenosis. No dissection or occlusion. ANTERIOR CEREBRAL ARTERIES: No significant stenosis. No occlusion. No aneurysm. MIDDLE CEREBRAL ARTERIES: No significant stenosis. No occlusion. Small (1 to 2 mm) posterior directed outpouching at the left MCA bifurcation. POSTERIOR CEREBRAL ARTERIES: No significant stenosis. No occlusion. No aneurysm. BASILAR ARTERY: No significant stenosis. No occlusion. No aneurysm. SOFT TISSUES: No acute finding. No masses or lymphadenopathy. BONES: No acute osseous abnormality. IMPRESSION: 1. No large vessel or proximal hemodynamically significant stenosis. 2. Small (1 to 2 mm) posterior directed outpouching at the left MCA bifurcation, compatible with infundibulum or aneurysm. Electronically signed by: Gilmore Molt MD 12/30/2023 01:12 AM EDT RP Workstation: [id number]   MR  BRAIN WO CONTRAST Result Date: 12/29/2023 EXAM: MRI BRAIN WITHOUT CONTRAST 12/29/2023 09:32:19 AM TECHNIQUE: Multiplanar multisequence MRI of the head/brain was performed without the administration of intravenous contrast. COMPARISON: CT yesterday and brain MRI 05/06/2018. CLINICAL HISTORY: 58 year old male. Stroke, follow up. FINDINGS: BRAIN AND VENTRICLES: Linear restricted diffusion tracking from the left corona radiata into the posterior left uncinate fasciculus measuring about 16 mm long axis with T2 and FLAIR hyperintensity. No associated hemorrhage or mass effect. No other restricted diffusion. No intracranial hemorrhage. No mass. No midline shift. No hydrocephalus. Expected evolution of left pontine lacunar infarct since 2020, small area of cystic and subchondral malacia there now. Chronic lacunar infarcts of the right basal ganglia are stable since 2020. Overall cerebral volume remains normal for age. Scattered subcortical white matter T2 and FLAIR hyperintensity is moderate for age and mostly affecting the frontal lobes. No cortical encephalomalacia identified. No chronic cerebral blood products on SWI. There is Wallerian degeneration in the left lower brainstem below the chronic pontine infarct. The sella is unremarkable. Normal flow voids. ORBITS: Postoperative changes to both globes since 2020. SINUSES AND MASTOIDS: No acute abnormality. BONES AND SOFT TISSUES: Normal marrow signal. No acute soft tissue abnormality. IMPRESSION: 1. Acute lacunar infarct left corona radiata and posterior left lentiform, no hemorrhage or mass effect. 2. Chronic left pontine lacunar infarct with expected evolution and associated Wallerian degeneration. 3. Chronic lacunar infarcts of the right basal ganglia, chronic frontal lobe white matter changes. Electronically signed by: Helayne Hurst MD 12/29/2023 09:40 AM EDT RP Workstation: [id number]     Discharge Instructions:   Discharge Instructions      [name],  You were recently admitted to [facility] for treatment of a stroke in your brain. You were given Aspirin  81 mg and Plavix  75 mg to prevent further strokes. We also increased your Atorvastatin  dose to 80 mg, which will help to control your cholesterol levels which will reduce your chances of a future stroke.   Continue taking your home medications with the following changes  Please start on Aspirin  81 mg and Plavix  75 mg once a day with the last dose  on 10/28. From then on, please only take Plavix  75 mg once a day.  We have increased the dose of your Atorvastatin  to 80 mg. Please continue to take once a day.    You should seek further medical care if you develop further weakness, speech difficulties, changes in vision, fevers, chest pain or any new concerning or worsening symptoms.  We have scheduled a follow up appointment with your primary care at the [facility] with Dr Elicia on 10/23 at 8:45 AM.   We are so glad that you are feeling better.  Sincerely, Ora Schuller, MS4       Signed: Ora Schuller, MS4 12/30/2023, 3:56 PM      I was personally present and re-performed the exam and medical decision making and verified the service and findings are accurately documented in the student's note.  Melvenia Morrison, MD 12/30/2023 4:04 PM

## 2023-12-30 NOTE — Progress Notes (Signed)
 Transition of Care [facility]) - Inpatient Brief Assessment   Patient Details  Name: Arthur Howard MRN: [id number] Date of Birth: [birth year]  [facility] CM/SW Contact:    Rosaline JONELLE Joe, RN Phone Number: 12/30/2023, 4:01 PM   Clinical Narrative: Md is requesting OUtpatient PT referral prior to discharge.  OP referral placed and MD requested to co-sign.  No other Ip Care management needs and patient can be discharged to home.   Transition of Care Asessment: Insurance and Status: Insurance coverage has been reviewed Patient has primary care physician: Yes Home environment has been reviewed: from home with family Prior level of function:: self Prior/Current Home Services: No current home services Social Drivers of Health Review: SDOH reviewed no interventions necessary Readmission risk has been reviewed: Yes Transition of care needs: (P) transition of care needs identified, TOC will continue to follow

## 2024-01-06 DIAGNOSIS — E113393 Type 2 diabetes mellitus with moderate nonproliferative diabetic retinopathy without macular edema, bilateral: Secondary | ICD-10-CM

## 2024-01-06 DIAGNOSIS — E1169 Type 2 diabetes mellitus with other specified complication: Secondary | ICD-10-CM

## 2024-01-06 DIAGNOSIS — I6930 Unspecified sequelae of cerebral infarction: Secondary | ICD-10-CM

## 2024-01-06 MED ORDER — ATORVASTATIN CALCIUM 40 MG PO TABS: 80.0000 mg | ORAL_TABLET | Freq: Every day | ORAL | 0 refills | Status: DC

## 2024-01-06 MED ORDER — JANUMET XR 100-1000 MG PO TB24: 1.0000 | ORAL_TABLET | Freq: Every day | ORAL | 3 refills | Status: AC

## 2024-01-06 MED ORDER — EMPAGLIFLOZIN 25 MG PO TABS: 25.0000 mg | ORAL_TABLET | Freq: Every day | ORAL | 2 refills | Status: DC

## 2024-01-12 ENCOUNTER — Telehealth: Payer: Self-pay | Admitting: Dietician

## 2024-01-12 ENCOUNTER — Ambulatory Visit: Attending: Infectious Diseases | Admitting: Physical Therapy

## 2024-01-12 ENCOUNTER — Telehealth: Payer: Self-pay | Admitting: Physical Therapy

## 2024-01-12 DIAGNOSIS — R2681 Unsteadiness on feet: Secondary | ICD-10-CM | POA: Insufficient documentation

## 2024-01-12 DIAGNOSIS — M6281 Muscle weakness (generalized): Secondary | ICD-10-CM | POA: Insufficient documentation

## 2024-01-12 DIAGNOSIS — I69351 Hemiplegia and hemiparesis following cerebral infarction affecting right dominant side: Secondary | ICD-10-CM | POA: Insufficient documentation

## 2024-01-12 DIAGNOSIS — R531 Weakness: Secondary | ICD-10-CM | POA: Diagnosis not present

## 2024-01-12 DIAGNOSIS — R29818 Other symptoms and signs involving the nervous system: Secondary | ICD-10-CM | POA: Insufficient documentation

## 2024-01-12 DIAGNOSIS — R2689 Other abnormalities of gait and mobility: Secondary | ICD-10-CM | POA: Insufficient documentation

## 2024-01-12 NOTE — Telephone Encounter (Signed)
 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  Arthur Howard will benefit from use of right AFO (O[place] Walk On) was trialed today and was benefiicial for improving pt's Rt foot clearance and safety with gait.  The orthotist Economist) would also need notes from a face to face visit documenting need for this orthosis in order to obtain authorization from insurance.   If you agree, please submit request in EPIC under MD Order, Other Orders (list Rt AFO [place] Walk on in comments) or fax to [facility] at [phone].   Pt would also benefit from OT services to address RUE weakness; there are 2 referrals in Epic for PT, dated the same day - one may have been mistakenly written for PT instead of OT, as I see where both PT and OT were recommended in the D/C summary.  If you agree with this referral, please place order for OT eval and treat in Epic and we will get him scheduled.  Thank you so much, Arthur Howard, PT   [facility] [address] [place], [place]  [address] Phone:  [phone] Fax:  [phone]

## 2024-01-12 NOTE — Therapy (Unsigned)
 OUTPATIENT PHYSICAL THERAPY NEURO EVALUATION   Patient Name: Arthur Howard MRN: [id number] DOB:May 12, 1965, 58 y.o., male Today's Date: 01/13/2024   PCP: Amilibia, Jaden, DO REFERRING PROVIDER: Eben Reyes BROCKS, MD  END OF SESSION:  PT End of Session - 01/13/24 1957     Visit Number 1    Number of Visits 9    Date for Recertification  03/18/24    Authorization Type [employer]    Authorization Time Period 01-11-24 - 03-23-24    PT Start Time 1018    PT Stop Time 1102    PT Time Calculation (min) 44 min    Equipment Utilized During Treatment Gait belt;Other (comment)   Rt [place] Walk on AFO   Activity Tolerance Patient tolerated treatment well    Behavior During Therapy [facility] for tasks assessed/performed          Past Medical History:  Diagnosis Date   Burning with urination 08/06/2020   Diabetes mellitus without complication (HCC) 2015   Dizziness 04/16/2022   Hyperlipidemia    Increased urinary frequency 04/02/2022   Stroke (HCC) 05/06/2018   R sided weakness   Past Surgical History:  Procedure Laterality Date   ANKLE SURGERY     CLOSED REDUCTION METACARPAL WITH PERCUTANEOUS PINNING Right 07/26/2020   Procedure: CLOSED REDUCTION METACARPAL WITH PERCUTANEOUS PINNING;  Surgeon: Murrell Drivers, MD;  Location: [facility];  Service: Orthopedics;  Laterality: Right;   Patient Active Problem List   Diagnosis Date Noted   Acute cerebral infarction (HCC) 12/29/2023   Right sided weakness 12/29/2023   Elevated BP reading w/ no diagnosis of HTN 12/29/2023   Musculoskeletal back pain 05/04/2023   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 type 2 diabetes mellitus (HCC) 05/09/2021   Spasticity as late effect of cerebrovascular accident (CVA) 04/25/2021   Healthcare maintenance 04/25/2021   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 (HCC) 08/02/2018   History of CVA with residual  deficit 05/11/2018   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 without macular edema, without long-term current use of insulin  (HCC) 08/12/2014    ONSET DATE: 12-28-23  REFERRING DIAG: R53.1 (ICD-10-CM) - Right sided weakness I69.351 (ICD-10-CM) - 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  Pt presented to [facility] ED on 12-28-23 with Rt sided weakness and slurred speech (was in hospital 10-6 - 12-30-23);   Pt reports he has more weakness in RUE than in his RLE since most recent CVA on  2 weeks ago; says his RUE and Rt foot affected the most.  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 with history of CVA with right-sided weakness as late effect who presents with right sided weakness and slurred speech and was admitted for workup of an acute lacunar infarct.     Acute cerebral infarction  Patient presented to the ED on 10/7 with right sided arm and leg weakness with slurred speech since 10/5. Has a history of small vessel disease and stroke in 2020 with sustained right sided weakness and word finding difficulties. MRI of the head demonstrated an acute  lacunar infarct of the left corona radiata and posterior left lentiform with choric left pontine lacunar infarct and chronic lacunar infarcts of the right basal ganglia and frontal lobe. Patient was not in treatment window for TNK or LVO.   PAIN:  Are you having pain? No  PRECAUTIONS: Fall  RED FLAGS: None   WEIGHT BEARING RESTRICTIONS: No  FALLS: Has patient fallen in last 6 months? No  LIVING ENVIRONMENT: Lives with: lives with their family Lives in: House/apartment Stairs: No Has following equipment at home: [facility]  PLOF: Independent with basic ADLs, Independent with household mobility without device, Independent with community mobility without device, and Independent with transfers  PATIENT GOALS: increase strength in Rt arm and leg   OBJECTIVE:  Note: Objective measures were completed at Evaluation unless otherwise noted.  DIAGNOSTIC FINDINGS: IMPRESSION:MRI 12-29-23 1. Acute lacunar infarct left corona radiata and posterior left lentiform, no hemorrhage or mass effect. 2. Chronic left pontine lacunar infarct with expected evolution and associated Wallerian degeneration. 3. Chronic lacunar infarcts of the right basal ganglia, chronic frontal lobe white matter changes.    COGNITION: Overall cognitive status: Within functional limits for tasks assessed   SENSATION: Not tested  COORDINATION: Decreased RLE due to decr. strength  POSTURE: No Significant postural limitations  LOWER EXTREMITY ROM:   WNL's except Rt ankle - decreased AROM due to weakness   LOWER EXTREMITY MMT:    MMT Right Eval Left Eval  Hip flexion 4 WNL's  Hip extension    Hip abduction    Hip adduction    Hip internal rotation    Hip external rotation    Knee flexion 4   Knee extension 5   Ankle dorsiflexion 2+ - 3-   Ankle plantarflexion 3-   Ankle inversion    Ankle eversion    (Blank rows = not tested)  BED MOBILITY:  Not tested  TRANSFERS: Sit to stand: Modified independence   Assistive device utilized: None     Stand to sit: Complete Independence  Assistive device utilized: None      STAIRS: Not tested GAIT: Findings: Gait Characteristics: step through pattern and decreased ankle dorsiflexion- Right, Distance walked: 75', Assistive device utilized:None, Level of assistance: Modified independence, and Comments: increased Rt foot supination in swing/stance  FUNCTIONAL TESTS:  Timed up and go (TUG): 15.41 secs without device without use of  AFO on RLE:  14.19 secs with AFO on RLE - no device 10 meter walk test: 14.94 secs without AFO = 2.20 ft/sec;  11.66 secs with AFO on RLE = 2.81 ft/sec with AFO on RLE                                                                                                                              TREATMENT DATE: 01-12-24  Educated pt and daughter in process for obtaining Rt AFO - pt requests that order be requested from MD and also order for OT be requested also  HEP: [employer]  Access Code: [email or web address] URL: [email or web address] Date: 01/13/2024 Prepared by: Rock Kussmaul  Exercises - Standing Bilateral Gastroc Stretch with Step  - 3 x daily - 7 x weekly - 1 sets - 1-2 reps - 20-30 sec hold - Single Leg Heel Raise with Counter Support  - 2-3 x daily - 7 x weekly - 1 sets - 10 reps - Seated Eccentric Ankle Plantar Flexion with Resistance - Straight Leg  - 2-3 x daily - 7 x weekly - 1 sets - 10 reps - Seated Ankle Dorsiflexion with Resistance  - 1-2 x daily - 7 x weekly - 3 sets - 10 reps - 2 sec hold  PATIENT EDUCATION: Education details:  HEP, OT referral, AFO recommendation  - educated pt and daughter of process with need for order from MD and also face to face visit notes Person educated: Patient and Child(ren) Education method: Explanation, Demonstration, and Handouts Education comprehension: verbalized understanding and returned demonstration  HOME EXERCISE PROGRAM: See anette  GOALS: Goals reviewed with  patient? Yes  SHORT TERM GOALS: Target date: 02-12-24  Obtain order for Rt AFO from MD. Baseline: Goal status: INITIAL  2.  Increase TUG score to </= 13.5 secs without device (without use of AFO unless obtained by this date). Baseline: 15.41 secs  Goal status: INITIAL  3.  Increase gait velocity to >/= 2.60 ft/sec without device for increased gait efficiency.  Baseline: 14.94 secs = 2.20 ft/sec Goal status: INITIAL  4.  Independent in HEP for RLE strengthening. Baseline:  Goal status: INITIAL  5.  Perform DGI with LTG to be set.  Baseline:  Goal status: INITIAL   LONG TERM GOALS: Target date: 03-11-24  Pt will independently don and doff AFO on RLE.  Baseline:  Goal status: INITIAL  2.   Increase TUG score to </= 12.0 secs without device (without use of AFO unless obtained by this date). Baseline: 15.41 secs Goal status: INITIAL  3.  Increase gait velocity to >/= 3.0 ft/sec without device for increased gait efficiency.  Baseline: 14.94 secs = 2.20 ft/sec Goal status: INITIAL  4.  Improve DGI score by at least 4 points.  Baseline:  Goal status: INITIAL  5.  Negotiate 4 steps with 1 rail using step over step sequence modified independently. Baseline:  Goal status: INITIAL  ASSESSMENT:  CLINICAL IMPRESSION: Patient  is a 58 y.o. gentleman who was seen today for physical therapy evaluation and treatment for Rt hemiparesis due to Lt CVA.  Pt presents with Rt ankle musc. Weakness with gait abnormalities including decreased dorsiflexion and increased supination of Rt foot in swing phase of gait.  Pt would benefit from an AFO for his RLE to improve gait mechanics and safety.  Pt's TUG score of 15.41 secs is indicative of fall risk; pt's gait velocity = 2.20 ft/sec without use of AFO and 2.8 ft/sec with use of AFO on RLE.  Pt would also benefit from an OT referral to address RUE weakness and impaired functional use.     OBJECTIVE IMPAIRMENTS: Abnormal gait, decreased activity  tolerance, decreased balance, decreased ROM, decreased strength, and impaired UE functional use.   ACTIVITY LIMITATIONS: carrying, lifting, stairs, and locomotion level  PARTICIPATION LIMITATIONS: meal prep, cleaning, driving, shopping, community activity, and yard work  PERSONAL FACTORS: Past/current experiences and 1-2 comorbidities: h/o previous Lt CVA are also affecting patient's functional outcome.   REHAB POTENTIAL: Good  CLINICAL DECISION MAKING: Evolving/moderate complexity  EVALUATION COMPLEXITY: Moderate  PLAN:  PT FREQUENCY: 1x/week  PT DURATION: 8 weeks + eval  PLANNED INTERVENTIONS: 97110-Therapeutic exercises, 97530- Therapeutic activity, [id number]- Neuromuscular re-education, 97535- Self Care, [id number]- Gait training, [phone]- Orthotic Initial, [phone]- Orthotic/Prosthetic subsequent, and Patient/Family education  PLAN FOR NEXT SESSION: DGI, Rt ankle strengthening; check HEP issued on 01-12-24; did we receive order for OT and AFO?   Roxanna [place], PT 01/13/2024, 8:01 PM

## 2024-01-12 NOTE — Telephone Encounter (Signed)
 Cora from Dr. Janit office returned my call

## 2024-01-12 NOTE — Telephone Encounter (Signed)
 Left office of Dr. Ozell Sar, [facility], to let us  know if patient's dilated eye exam on 10/19/23 showed diabetes retinopathy or no retinopathy.

## 2024-01-13 ENCOUNTER — Encounter: Payer: Self-pay | Admitting: Physical Therapy

## 2024-01-14 ENCOUNTER — Encounter: Payer: Self-pay | Admitting: Dietician

## 2024-01-14 ENCOUNTER — Other Ambulatory Visit: Payer: Self-pay | Admitting: Infectious Diseases

## 2024-01-14 ENCOUNTER — Other Ambulatory Visit (HOSPITAL_COMMUNITY): Payer: Self-pay

## 2024-01-14 ENCOUNTER — Ambulatory Visit: Payer: Self-pay | Admitting: Student

## 2024-01-14 ENCOUNTER — Other Ambulatory Visit: Payer: Self-pay

## 2024-01-14 VITALS — BP 124/75 | HR 64 | Temp 98.0°F | Resp 28 | Ht 62.0 in | Wt 142.0 lb

## 2024-01-14 DIAGNOSIS — E785 Hyperlipidemia, unspecified: Secondary | ICD-10-CM | POA: Diagnosis not present

## 2024-01-14 DIAGNOSIS — I69351 Hemiplegia and hemiparesis following cerebral infarction affecting right dominant side: Secondary | ICD-10-CM | POA: Diagnosis not present

## 2024-01-14 DIAGNOSIS — E113293 Type 2 diabetes mellitus with mild nonproliferative diabetic retinopathy without macular edema, bilateral: Secondary | ICD-10-CM | POA: Diagnosis not present

## 2024-01-14 DIAGNOSIS — Z87891 Personal history of nicotine dependence: Secondary | ICD-10-CM

## 2024-01-14 DIAGNOSIS — Z8673 Personal history of transient ischemic attack (TIA), and cerebral infarction without residual deficits: Secondary | ICD-10-CM

## 2024-01-14 DIAGNOSIS — Z23 Encounter for immunization: Secondary | ICD-10-CM

## 2024-01-14 DIAGNOSIS — Z7984 Long term (current) use of oral hypoglycemic drugs: Secondary | ICD-10-CM

## 2024-01-14 DIAGNOSIS — I639 Cerebral infarction, unspecified: Secondary | ICD-10-CM

## 2024-01-14 MED ORDER — CLOPIDOGREL BISULFATE 75 MG PO TABS
75.0000 mg | ORAL_TABLET | Freq: Every day | ORAL | 3 refills | Status: DC
  Filled 2024-01-14 – 2024-01-26 (×4): qty 90, 90d supply, fill #0

## 2024-01-14 NOTE — Assessment & Plan Note (Deleted)
  Orders:   Ambulatory referral to Occupational Therapy   clopidogrel  (PLAVIX ) 75 MG tablet; Take 1 tablet (75 mg total) by mouth daily.   Ambulatory referral to Neurology   Ambulatory Referral for DME

## 2024-01-14 NOTE — Patient Instructions (Addendum)
 Thank you, [name] for allowing us  to provide your care today. Today we discussed:  -Refilled Plavix  which you will continue to take. -STOP Aspirin  after 3 weeks (end of October)  -Take Atorvastatin  80 mg daily  -Continue Janumet  and Jardiance  for diabetes -Referral for Occupational Therapy, neurology and right AFO sent today   -Try melatonin at bedtime  -Got flu shot today    I have ordered the following medication/changed the following medications:  Start the following medications: Meds ordered this encounter  Medications   clopidogrel  (PLAVIX ) 75 MG tablet    Sig: Take 1 tablet (75 mg total) by mouth daily.    Dispense:  90 tablet    Refill:  3     Follow up: 3 months   Should you have any questions or concerns please call the internal medicine clinic at [phone].    Siddiq Kaluzny, D.O. [facility]

## 2024-01-14 NOTE — Progress Notes (Unsigned)
 CC: HFU  HPI: [name] is a 58 y.o. male living with a history stated below and presents today for HFU. Please see problem based assessment and plan for additional details.  In person interpreter (Daughter) present during encounter.  Follow up Hospitalization  Patient was admitted to [facility] on 10/7 and discharged on 10/8. He was treated for acute CVA. Treatment for this included risks stratification. Telephone follow up was done on N/A He reports good compliance with treatment. He reports this condition is improved.  ----------------------------------------------------------------------------------------- -  Past Medical History:  Diagnosis Date   Burning with urination 08/06/2020   Diabetes mellitus without complication (HCC) 2015   Dizziness 04/16/2022   Hyperlipidemia    Increased urinary frequency 04/02/2022   Stroke (HCC) 05/06/2018   R sided weakness    Current Outpatient Medications on File Prior to Visit  Medication Sig Dispense Refill   Accu-Chek Softclix Lancets lancets Use as directed 4 (four) times daily. 100 each 12   Artificial Tear Ointment (DRY EYES OP) Place 1 drop into both eyes as needed (Dry eye).     aspirin  EC 81 MG tablet Take 1 tablet (81 mg total) by mouth daily for 21 days. 21 each 0   atorvastatin  (LIPITOR ) 40 MG tablet Take 2 tablets (80 mg total) by mouth daily. 60 tablet 0   Blood Glucose Monitoring Suppl (ACCU-CHEK GUIDE ME) w/Device KIT Use to test blood sugar with test strips. 1 kit 0   empagliflozin  (JARDIANCE ) 25 MG TABS tablet Take 1 tablet (25 mg total) by mouth daily before breakfast. 30 tablet 2   glucose blood (ACCU-CHEK GUIDE) test strip Use to test blood glucose 4 (four) times daily. 100 each 12   SitaGLIPtin -MetFORMIN  HCl (JANUMET  XR) [phone] MG TB24 Take 1 tablet by mouth daily. 90 tablet 3   [DISCONTINUED] blood glucose meter kit and supplies KIT Dispense based on patient and insurance preference. Use up  to four times daily as directed. (FOR ICD-9 250.00, 250.01). (Patient not taking: Reported on 01/25/2020) 1 each 0   [DISCONTINUED] omega-3 acid ethyl esters (LOVAZA ) 1 g capsule Take 1 capsule (1 g total) by mouth daily. 90 capsule 3   No current facility-administered medications on file prior to visit.    Family History  Problem Relation Age of Onset   Heart disease Mother    Lung cancer Father    Diabetes Sister    Diabetes Brother    CVA Neg Hx    Colon cancer Neg Hx    Colon polyps Neg Hx    Esophageal cancer Neg Hx    Rectal cancer Neg Hx    Stomach cancer Neg Hx     Social History   Socioeconomic History   Marital status: Married    Spouse name: Arthur Howard   Number of children: 7   Years of education: Not on file   Highest education level: 6th grade  Occupational History   Occupation: Network engineer    Comment: [employer]  Tobacco Use   Smoking status: Former    Types: Cigarettes   Smokeless tobacco: Never   Tobacco comments:    never a daily smoker  Vaping Use   Vaping status: Never Used  Substance and Sexual Activity   Alcohol use: No    Alcohol/week: 0.0 standard drinks of alcohol   Drug use: No   Sexual activity: Not on file  Other Topics Concern   Not on file  Social History Narrative   Works with  insulation. Lives in [place]. Moved from [place] 35 years ago. He is married. Has 7 children (all girls).       Patient is right-handed. He lives with his wife in a one level home. He walks daily for exercise.   Social Drivers of Corporate investment banker Strain: Not on file  Food Insecurity: No Food Insecurity (12/29/2023)   Hunger Vital Sign    Worried About Running Out of Food in the Last Year: Never true    Ran Out of Food in the Last Year: Never true  Transportation Needs: No Transportation Needs (12/29/2023)   PRAPARE - Administrator, Civil Service (Medical): No    Lack of Transportation (Non-Medical): No  Physical Activity: Not on file  Stress:  Not on file  Social Connections: Not on file  Intimate Partner Violence: Not At Risk (12/29/2023)   Humiliation, Afraid, Rape, and Kick questionnaire    Fear of Current or Ex-Partner: No    Emotionally Abused: No    Physically Abused: No    Sexually Abused: No    Review of Systems: ROS negative except for what is noted on the assessment and plan.  Vitals:   01/14/24 0844  BP: 124/75  Pulse: 64  Resp: (!) 28  Temp: 98 F (36.7 C)  TempSrc: Oral  SpO2: 97%  Weight: 142 lb (64.4 kg)  Height: 5' 2 (1.575 m)   Physical Exam: Constitutional: alert, sitting up on exam table comfortably, in no acute distress Cardiovascular: regular rate and rhythm Pulmonary/Chest: normal work of breathing on room air Neurological: alert & oriented x 3, CN grossly intact, no facial asymmetry or slurred speech, 4+/5 of RUE and RLE, 5/5 LUE and LLE Skin: warm and dry  Assessment & Plan:   Assessment & Plan Recent cerebrovascular accident (CVA) Hospital follow-up for recent CVA. Imaging w/ Left acute lacunar infarct. Deficits improving since d/c per patient and pt's daughter. Prior CVA in 2020 w/ some residual R-sided weakness. Not in window for TNK. No LVO noted. Unremarkable TTE but cannot r/o ASD/PFO. Increased Lipitor  to 80 mg, LDL 69. Recent A1c controlled at 6.6. DAPT x 21 days (EOT 10/29) then Plavix  alone. Working w/ outpatient PT now. Deficits improving on exam. Request OT and Right AFO, plan to have patient f/u w/ neuro.   Orders:   Ambulatory referral to Occupational Therapy   clopidogrel  (PLAVIX ) 75 MG tablet; Take 1 tablet (75 mg total) by mouth daily.   Ambulatory referral to Neurology   Ambulatory Referral for DME  Encounter for immunization Received flu shot today.  Orders:   Flu vaccine trivalent PF, 6mos and older(Flulaval,Afluria,Fluarix,Fluzone)  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 without macular edema, without long-term  current use of insulin  (HCC) Status: Controlled/At Goal.Last A1c 6.6 in 12/29/2022.  A1c today is not due.  Currently taking Jardiance  25 mg, Janumet  100-100 mg. Foot exam completed today.   Plan -Continue Jardiance  and Janumet  -A1c in 3 months -Ophthalmology exam: 04/2023 -Urine ACR: 02/2023, normal  -LDL 69 on 12/2023, taking atorvastatin  80 mg (increased at hospital)    Hyperlipidemia, unspecified hyperlipidemia type Lab Results  Component Value Date   CHOL 123 12/29/2023   HDL 39 (L) 12/29/2023   LDLCALC 69 12/29/2023   TRIG 77 12/29/2023   CHOLHDL 3.2 12/29/2023   Increased atorvastatin  to 80 mg during hospitalization.  Counseled patient to take current Rx of 40 mg x 2 tablets (together or BID). Reports  mild side effects w/ 80 in past but agreeable w/ dose now. Consider recheck in 2-3 months.       Return in about 3 months (around 04/15/2024) for routine visit .   Patient discussed with Dr. Jeanelle Ozell Nearing, D.O. [facility], PGY-3 Clinic Phone: [phone] Date 01/15/2024 Time 12:05 PM

## 2024-01-15 ENCOUNTER — Encounter: Payer: Self-pay | Admitting: Student

## 2024-01-15 NOTE — Assessment & Plan Note (Signed)
 Status: Controlled/At Goal.Last A1c 6.6 in 12/29/2022.  A1c today is not due.  Currently taking Jardiance  25 mg, Janumet  100-100 mg. Foot exam completed today.   Plan -Continue Jardiance  and Janumet  -A1c in 3 months -Ophthalmology exam: 04/2023 -Urine ACR: 02/2023, normal  -LDL 69 on 12/2023, taking atorvastatin  80 mg (increased at hospital)

## 2024-01-15 NOTE — Assessment & Plan Note (Signed)
 Hospital follow-up for recent CVA. Imaging w/ Left acute lacunar infarct. Deficits improving since d/c per patient and pt's daughter. Prior CVA in 2020 w/ some residual R-sided weakness. Not in window for TNK. No LVO noted. Unremarkable TTE but cannot r/o ASD/PFO. Increased Lipitor  to 80 mg, LDL 69. Recent A1c controlled at 6.6. DAPT x 21 days (EOT 10/29) then Plavix  alone. Working w/ outpatient PT now. Deficits improving on exam. Request OT and Right AFO, plan to have patient f/u w/ neuro.   Orders:   Ambulatory referral to Occupational Therapy   clopidogrel  (PLAVIX ) 75 MG tablet; Take 1 tablet (75 mg total) by mouth daily.   Ambulatory referral to Neurology   Ambulatory Referral for DME

## 2024-01-18 ENCOUNTER — Other Ambulatory Visit: Payer: Self-pay

## 2024-01-18 ENCOUNTER — Telehealth: Payer: Self-pay

## 2024-01-18 DIAGNOSIS — I6930 Unspecified sequelae of cerebral infarction: Secondary | ICD-10-CM

## 2024-01-18 DIAGNOSIS — E1169 Type 2 diabetes mellitus with other specified complication: Secondary | ICD-10-CM

## 2024-01-18 MED ORDER — ATORVASTATIN CALCIUM 40 MG PO TABS: 80.0000 mg | ORAL_TABLET | Freq: Every day | ORAL | 3 refills | Status: DC

## 2024-01-18 NOTE — Telephone Encounter (Signed)
 Pharmacy is requesting additional refills.  Medication sent to pharmacy.

## 2024-01-18 NOTE — Telephone Encounter (Signed)
 I contacted pt's daughter Olam to let her know that Dr. Elicia did not complete the disability exceptions form, but no answer.  The original is ready for pick-up. I left detailed message to call the clinic back.

## 2024-01-19 ENCOUNTER — Ambulatory Visit: Admitting: Physical Therapy

## 2024-01-19 ENCOUNTER — Encounter: Payer: Self-pay | Admitting: Physical Therapy

## 2024-01-19 DIAGNOSIS — I69351 Hemiplegia and hemiparesis following cerebral infarction affecting right dominant side: Secondary | ICD-10-CM | POA: Diagnosis not present

## 2024-01-19 DIAGNOSIS — R2681 Unsteadiness on feet: Secondary | ICD-10-CM

## 2024-01-19 DIAGNOSIS — M6281 Muscle weakness (generalized): Secondary | ICD-10-CM | POA: Diagnosis not present

## 2024-01-19 DIAGNOSIS — R2689 Other abnormalities of gait and mobility: Secondary | ICD-10-CM

## 2024-01-19 DIAGNOSIS — R29818 Other symptoms and signs involving the nervous system: Secondary | ICD-10-CM | POA: Diagnosis not present

## 2024-01-19 DIAGNOSIS — R531 Weakness: Secondary | ICD-10-CM | POA: Diagnosis not present

## 2024-01-19 NOTE — Therapy (Signed)
 OUTPATIENT PHYSICAL THERAPY NEURO TREATMENT   Patient Name: Arthur Howard MRN: [id number] DOB:1965-06-19, 58 y.o., male Today's Date: 01/19/2024   PCP: Amilibia, Jaden, DO REFERRING PROVIDER: Eben Reyes BROCKS, MD  END OF SESSION:  PT End of Session - 01/19/24 0937     Visit Number 2    Number of Visits 9    Date for Recertification  03/18/24    Authorization Type [employer]    Authorization Time Period 01-11-24 - 03-23-24    PT Start Time 0933    PT Stop Time 1016    PT Time Calculation (min) 43 min    Equipment Utilized During Treatment Gait belt    Activity Tolerance Patient tolerated treatment well    Behavior During Therapy [facility] for tasks assessed/performed          Past Medical History:  Diagnosis Date   Burning with urination 08/06/2020   Diabetes mellitus without complication (HCC) 2015   Dizziness 04/16/2022   Hyperlipidemia    Increased urinary frequency 04/02/2022   Stroke (HCC) 05/06/2018   R sided weakness   Past Surgical History:  Procedure Laterality Date   ANKLE SURGERY     CLOSED REDUCTION METACARPAL WITH PERCUTANEOUS PINNING Right 07/26/2020   Procedure: CLOSED REDUCTION METACARPAL WITH PERCUTANEOUS PINNING;  Surgeon: Murrell Drivers, MD;  Location: [facility];  Service: Orthopedics;  Laterality: Right;   Patient Active Problem List   Diagnosis Date Noted   Recent cerebrovascular accident (CVA) 12/29/2023   Elevated BP reading w/ no diagnosis of HTN 12/29/2023   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 type 2 diabetes mellitus (HCC) 05/09/2021   Spasticity as late effect of cerebrovascular accident (CVA) 04/25/2021   Healthcare maintenance 04/25/2021   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 (HCC) 08/02/2018   History of CVA with residual deficit 05/11/2018   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 without  macular edema, without long-term current use of insulin  (HCC) 08/12/2014    ONSET DATE: 12-28-23  REFERRING DIAG: R53.1 (ICD-10-CM) - Right sided weakness I69.351 (ICD-10-CM) - 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  He denies recent falls, changes, or pain.  They inquire about AFO timeline. 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 with history of CVA with right-sided weakness as late effect who presents with right sided weakness and slurred speech and was admitted for workup of an acute lacunar infarct.     Acute cerebral infarction  Patient presented to the ED on 10/7 with right sided arm and leg weakness with slurred speech since 10/5. Has a history of small vessel disease and stroke in 2020 with sustained right sided weakness and word finding difficulties. MRI of the head demonstrated an acute lacunar infarct of the left corona radiata and posterior left lentiform with choric left pontine lacunar infarct and chronic lacunar infarcts of the right basal ganglia and frontal lobe. Patient was not in treatment window for TNK or LVO.   PAIN:  Are you having pain? No  PRECAUTIONS:  Fall  RED FLAGS: None   WEIGHT BEARING RESTRICTIONS: No  FALLS: Has patient fallen in last 6 months? No  LIVING ENVIRONMENT: Lives with: lives with their family Lives in: House/apartment Stairs: No Has following equipment at home: [facility]  PLOF: Independent with basic ADLs, Independent with household mobility without device, Independent with community mobility without device, and Independent with transfers  PATIENT GOALS: increase strength in Rt arm and leg   OBJECTIVE:  Note: Objective measures were completed at Evaluation unless otherwise noted.  DIAGNOSTIC FINDINGS: IMPRESSION:MRI 12-29-23 1. Acute lacunar infarct left corona radiata and posterior left lentiform, no hemorrhage or mass effect. 2. Chronic left pontine lacunar infarct with expected evolution and associated Wallerian degeneration. 3. Chronic lacunar infarcts of the right basal ganglia, chronic frontal lobe white matter changes.    COGNITION: Overall cognitive status: Within functional limits for tasks assessed   SENSATION: Not tested  COORDINATION: Decreased RLE due to decr. strength  POSTURE: No Significant postural limitations  LOWER EXTREMITY ROM:   WNL's except Rt ankle - decreased AROM due to weakness   LOWER EXTREMITY MMT:    MMT Right Eval Left Eval  Hip flexion 4 WNL's  Hip extension    Hip abduction    Hip adduction    Hip internal rotation    Hip external rotation    Knee flexion 4   Knee extension 5   Ankle dorsiflexion 2+ - 3-   Ankle plantarflexion 3-   Ankle inversion    Ankle eversion    (Blank rows = not tested)  BED MOBILITY:  Not tested  TRANSFERS: Sit to stand: Modified independence  Assistive device utilized: None     Stand to sit: Complete Independence  Assistive device utilized: None      STAIRS: Not tested GAIT: Findings: Gait Characteristics: step through pattern and decreased ankle dorsiflexion- Right, Distance walked: 75', Assistive device utilized:None,  Level of assistance: Modified independence, and Comments: increased Rt foot supination in swing/stance  FUNCTIONAL TESTS:  Timed up and go (TUG): 15.41 secs without device without use of AFO on RLE:  14.19 secs with AFO on RLE - no device 10 meter walk test: 14.94 secs without AFO = 2.20 ft/sec;  11.66 secs with AFO on RLE = 2.81 ft/sec with AFO on RLE  TREATMENT DATE: 01-19-24  -x2 minutes lateral weight shifts on lowest tilt board in sitting for R ankle mobility > A/P shifts in sitting x 2 minutes -Seated heel and toe raises x1 minute -Seated towel R foot INV/EV x1 minute -Seated lowercase ankle abcs x2 (confirmed pt knows english alphabet) - small amplitude of movement -DGI:  [facility] PT Assessment - 01/19/24 0949       Standardized Balance Assessment   Standardized Balance Assessment Dynamic Gait Index      Dynamic Gait Index   Level Surface Mild Impairment    Change in Gait Speed Mild Impairment    Gait with Horizontal Head Turns Mild Impairment    Gait with Vertical Head Turns Mild Impairment    Gait and Pivot Turn Mild Impairment    Step Over Obstacle Moderate Impairment    Step Around Obstacles Moderate Impairment    Steps Moderate Impairment    Total Score 13    DGI comment: 13/24 = high fall risk         -Repeated stairs using RLE to lead on descent with smoother and consistent foot clearance.  -SciFit using BLE only for muscular endurance up to level 4.0 for 8 minutes.  PATIENT EDUCATION: Education details:  Continue HEP; AFO process and timeline to obtain - will send AFO order and MD note today 10/28.  Scheduling remaining PT visits.  Stair safety. Person educated: Patient and Child(ren) Education method: Explanation, Demonstration, and Handouts Education comprehension: verbalized understanding and returned demonstration  HOME EXERCISE  PROGRAM: Access Code: [email or web address] URL: [email or web address] Date: 01/13/2024 Prepared by: Rock Kussmaul  Exercises - Standing Bilateral Gastroc Stretch with Step  - 3 x daily - 7 x weekly - 1 sets - 1-2 reps - 20-30 sec hold - Single Leg Heel Raise with Counter Support  - 2-3 x daily - 7 x weekly - 1 sets - 10 reps - Seated Eccentric Ankle Plantar Flexion with Resistance - Straight Leg  - 2-3 x daily - 7 x weekly - 1 sets - 10 reps - Seated Ankle Dorsiflexion with Resistance  - 1-2 x daily - 7 x weekly - 3 sets - 10 reps - 2 sec hold  GOALS: Goals reviewed with patient? Yes  SHORT TERM GOALS: Target date: 02-12-24  Obtain order for Rt AFO from MD. Baseline: Goal status: INITIAL  2.  Increase TUG score to </= 13.5 secs without device (without use of AFO unless obtained by this date). Baseline: 15.41 secs  Goal status: INITIAL  3.  Increase gait velocity to >/= 2.60 ft/sec without device for increased gait efficiency.  Baseline: 14.94 secs = 2.20 ft/sec Goal status: INITIAL  4.  Independent in HEP for RLE strengthening. Baseline:  Goal status: INITIAL  5.  Perform DGI with LTG to be set.  Baseline: 13/24 (10/28) Goal status: MET   LONG TERM GOALS: Target date: 03-11-24  Pt will independently don and doff AFO on RLE.  Baseline:  Goal status: INITIAL  2.   Increase TUG score to </= 12.0 secs without device (without use of AFO unless obtained by this date). Baseline: 15.41 secs Goal status: INITIAL  3.  Increase gait velocity to >/= 3.0 ft/sec without device for increased gait efficiency.  Baseline: 14.94 secs = 2.20 ft/sec Goal status: INITIAL  4.  Improve DGI score by at least 4 points.  Baseline: 13/24 (10/28) Goal status: INITIAL  5.  Negotiate 4 steps with 1 rail using step over step sequence modified independently. Baseline:  Goal status: INITIAL  ASSESSMENT:  CLINICAL IMPRESSION: Patient seen for skilled PT session today focused on capturing  DGI metric and addressing ankle mobility and muscular endurance.  He demonstrates no significant gait deviations following SciFit so likely can tolerate higher intensity next attempt.  His ankle movements remain small in amplitude particularly in the sagittal plane, but he does well with focus in sitting today to isolate these movements.  He is compliant to HEP thus far and would benefit from addition of higher level strength and balance to improve right knee control and reciprocal mobility.  Continue per POC.   OBJECTIVE IMPAIRMENTS: Abnormal gait, decreased activity tolerance, decreased balance, decreased ROM, decreased strength, and impaired UE functional use.   ACTIVITY LIMITATIONS: carrying, lifting, stairs, and locomotion level  PARTICIPATION LIMITATIONS: meal prep, cleaning, driving, shopping, community activity, and yard work  PERSONAL FACTORS: Past/current experiences and 1-2 comorbidities: h/o previous Lt CVA are also affecting patient's functional outcome.   REHAB POTENTIAL: Good  CLINICAL DECISION MAKING: Evolving/moderate complexity  EVALUATION COMPLEXITY: Moderate  PLAN:  PT FREQUENCY: 1x/week  PT DURATION: 8 weeks + eval  PLANNED INTERVENTIONS: 97110-Therapeutic exercises, 97530- Therapeutic activity, [id number]- Neuromuscular re-education, [phone]- Self Care, [id number]- Gait training, [phone]- Orthotic Initial, [phone]- Orthotic/Prosthetic subsequent, and Patient/Family education  PLAN FOR NEXT SESSION: Rt ankle strengthening; check HEP issued on 01-12-24; did they get called to schedule w/ [facility]?  Were they able to schedule 5 more PT visits and coordinate w/ OT just in case?  Hamstring and quad strength.  Muscular endurance (Rt LE)   Daved KATHEE Bull, PT, DPT 01/19/2024, 10:33 AM

## 2024-01-20 ENCOUNTER — Other Ambulatory Visit (HOSPITAL_COMMUNITY): Payer: Self-pay

## 2024-01-21 ENCOUNTER — Other Ambulatory Visit (HOSPITAL_COMMUNITY): Payer: Self-pay

## 2024-01-21 NOTE — Progress Notes (Signed)
 Internal Medicine Clinic Attending  Case discussed with the resident at the time of the visit.  We reviewed the resident's history and exam and pertinent patient test results.  I agree with the assessment, diagnosis, and plan of care documented in the resident's note.

## 2024-01-26 ENCOUNTER — Other Ambulatory Visit (HOSPITAL_COMMUNITY): Payer: Self-pay

## 2024-01-26 ENCOUNTER — Ambulatory Visit: Attending: Infectious Diseases | Admitting: Physical Therapy

## 2024-01-26 DIAGNOSIS — R278 Other lack of coordination: Secondary | ICD-10-CM | POA: Insufficient documentation

## 2024-01-26 DIAGNOSIS — I69351 Hemiplegia and hemiparesis following cerebral infarction affecting right dominant side: Secondary | ICD-10-CM | POA: Diagnosis not present

## 2024-01-26 DIAGNOSIS — R29818 Other symptoms and signs involving the nervous system: Secondary | ICD-10-CM | POA: Insufficient documentation

## 2024-01-26 DIAGNOSIS — R2689 Other abnormalities of gait and mobility: Secondary | ICD-10-CM | POA: Insufficient documentation

## 2024-01-26 DIAGNOSIS — R2681 Unsteadiness on feet: Secondary | ICD-10-CM | POA: Diagnosis not present

## 2024-01-26 DIAGNOSIS — R208 Other disturbances of skin sensation: Secondary | ICD-10-CM | POA: Insufficient documentation

## 2024-01-26 DIAGNOSIS — M6281 Muscle weakness (generalized): Secondary | ICD-10-CM | POA: Insufficient documentation

## 2024-01-26 DIAGNOSIS — M25511 Pain in right shoulder: Secondary | ICD-10-CM | POA: Insufficient documentation

## 2024-01-26 NOTE — Progress Notes (Unsigned)
 [facility] [address] [place] [address] [phone]       HOSPITAL FOLLOW UP NOTE  [name] Date of Birth:  10/19/1965 Medical Record Number:  [id number]   Reason for Referral:  hospital stroke follow up    SUBJECTIVE:   CHIEF COMPLAINT:  No chief complaint on file.   HPI:   [name] is a 58 y.o. male with history of diabetes mellitus, hyperlipidemia, and previous CVA (05/06/2018) who presented to [facility] on 12/29/2023 early in the morning after leaving [facility] with right sided weakness present over the past 2 days.  Stroke workup revealed left corona radiata and posterior left lentiform infarcts secondary to small vessel disease.  Further stroke workup noted below.  Recommended DAPT for 3 weeks and Plavix  alone and increase home dose atorvastatin  40 mg to 80 mg daily.  Noted controlled DM on current regimen.  Therapies recommended outpatient therapy.        PERTINENT IMAGING  CTA head & neck No LVO Small (1 to 2 mm) posterior directed outpouching at the left MCA bifurcation MRI  Acute lacunar infarct left corona radiata and posterior left lentiform  2D Echo EF 60-65%  LDL 69 HgbA1c 6.6    ROS:   14 system review of systems performed and negative with exception of ***  PMH:  Past Medical History:  Diagnosis Date   Burning with urination 08/06/2020   Diabetes mellitus without complication (HCC) 2015   Dizziness 04/16/2022   Hyperlipidemia    Increased urinary frequency 04/02/2022   Stroke (HCC) 05/06/2018   R sided weakness    PSH:  Past Surgical History:  Procedure Laterality Date   ANKLE SURGERY     CLOSED REDUCTION METACARPAL WITH PERCUTANEOUS PINNING Right 07/26/2020   Procedure: CLOSED REDUCTION METACARPAL WITH PERCUTANEOUS PINNING;  Surgeon: Murrell Drivers, MD;  Location: [facility];  Service: Orthopedics;  Laterality: Right;    Social History:  Social History   Socioeconomic History    Marital status: Married    Spouse name: Arthur Howard   Number of children: 7   Years of education: Not on file   Highest education level: 6th grade  Occupational History   Occupation: network engineer    Comment: [employer]  Tobacco Use   Smoking status: Former    Types: Cigarettes   Smokeless tobacco: Never   Tobacco comments:    never a daily smoker  Vaping Use   Vaping status: Never Used  Substance and Sexual Activity   Alcohol use: No    Alcohol/week: 0.0 standard drinks of alcohol   Drug use: No   Sexual activity: Not on file  Other Topics Concern   Not on file  Social History Narrative   Works with network engineer. Lives in [place]. Moved from [place] 35 years ago. He is married. Has 7 children (all girls).       Patient is right-handed. He lives with his wife in a one level home. He walks daily for exercise.   Social Drivers of Corporate Investment Banker Strain: Not on file  Food Insecurity: No Food Insecurity (12/29/2023)   Hunger Vital Sign    Worried About Running Out of Food in the Last Year: Never true    Ran Out of Food in the Last Year: Never true  Transportation Needs: No Transportation Needs (12/29/2023)   PRAPARE - Administrator, Civil Service (Medical): No    Lack of Transportation (Non-Medical): No  Physical Activity: Not on file  Stress: Not on file  Social Connections: Not on file  Intimate Partner Violence: Not At Risk (12/29/2023)   Humiliation, Afraid, Rape, and Kick questionnaire    Fear of Current or Ex-Partner: No    Emotionally Abused: No    Physically Abused: No    Sexually Abused: No    Family History:  Family History  Problem Relation Age of Onset   Heart disease Mother    Lung cancer Father    Diabetes Sister    Diabetes Brother    CVA Neg Hx    Colon cancer Neg Hx    Colon polyps Neg Hx    Esophageal cancer Neg Hx    Rectal cancer Neg Hx    Stomach cancer Neg Hx     Medications:   Current Outpatient Medications on File Prior to  Visit  Medication Sig Dispense Refill   Accu-Chek Softclix Lancets lancets Use as directed 4 (four) times daily. 100 each 12   Artificial Tear Ointment (DRY EYES OP) Place 1 drop into both eyes as needed (Dry eye).     atorvastatin  (LIPITOR ) 40 MG tablet Take 2 tablets (80 mg total) by mouth daily. 60 tablet 3   Blood Glucose Monitoring Suppl (ACCU-CHEK GUIDE ME) w/Device KIT Use to test blood sugar with test strips. 1 kit 0   clopidogrel  (PLAVIX ) 75 MG tablet Take 1 tablet (75 mg total) by mouth daily. 90 tablet 3   empagliflozin  (JARDIANCE ) 25 MG TABS tablet Take 1 tablet (25 mg total) by mouth daily before breakfast. 30 tablet 2   glucose blood (ACCU-CHEK GUIDE) test strip Use to test blood glucose 4 (four) times daily. 100 each 12   SitaGLIPtin -MetFORMIN  HCl (JANUMET  XR) [phone] MG TB24 Take 1 tablet by mouth daily. 90 tablet 3   [DISCONTINUED] blood glucose meter kit and supplies KIT Dispense based on patient and insurance preference. Use up to four times daily as directed. (FOR ICD-9 250.00, 250.01). (Patient not taking: Reported on 01/25/2020) 1 each 0   [DISCONTINUED] omega-3 acid ethyl esters (LOVAZA ) 1 g capsule Take 1 capsule (1 g total) by mouth daily. 90 capsule 3   No current facility-administered medications on file prior to visit.    Allergies:  No Known Allergies    OBJECTIVE:  Physical Exam  There were no vitals filed for this visit. There is no height or weight on file to calculate BMI. No results found.   General: well developed, well nourished, seated, in no evident distress Head: head normocephalic and atraumatic.   Neck: supple with no carotid or supraclavicular bruits Cardiovascular: regular rate and rhythm, no murmurs Musculoskeletal: no deformity Skin:  no rash/petichiae Vascular:  Normal pulses all extremities   Neurologic Exam Mental Status: Awake and fully alert. Oriented to place and time. Recent and remote memory intact. Attention span,  concentration and fund of knowledge appropriate. Mood and affect appropriate.  Cranial Nerves: Fundoscopic exam reveals sharp disc margins. Pupils equal, briskly reactive to light. Extraocular movements full without nystagmus. Visual fields full to confrontation. Hearing intact. Facial sensation intact. Face, tongue, palate moves normally and symmetrically.  Motor: Normal bulk and tone. Normal strength in all tested extremity muscles Sensory.: intact to touch , pinprick , position and vibratory sensation.  Coordination: Rapid alternating movements normal in all extremities. Finger-to-nose and heel-to-shin performed accurately bilaterally. Gait and Station: Arises from chair without difficulty. Stance is normal. Gait demonstrates normal stride length and balance with ***. Tandem walk  and heel toe ***.  Reflexes: 1+ and symmetric. Toes downgoing.     NIHSS  *** Modified Rankin  ***      ASSESSMENT: Arthur Howard is a 58 y.o. year old male with left CR and posterior left lentiform infarcts on 12/29/2023 likely secondary to small vessel disease. Vascular risk factors include HTN, HLD, DM and history of stroke in 2020.      PLAN:  Ischemic infarcts:  Residual deficit: ***.  Continue Plavix  and atorvastatin  (Lipitor ) for secondary stroke prevention managed/prescribed by PCP.   Discussed secondary stroke prevention measures and importance of close PCP follow up for aggressive stroke risk factor management including BP goal<130/90, HLD with LDL goal<70 and DM with A1c.<7 .  Stroke labs 12/2023: LDL 69, A1c 6.6 I have gone over the pathophysiology of stroke, warning signs and symptoms, risk factors and their management in some detail with instructions to go to the closest emergency room for symptoms of concern.     Follow up in *** or call earlier if needed   CC:  [facility] provider: Dr. Rosemarie PCP: Amilibia, Jaden, DO    I personally spent a total of *** minutes in the care of the patient  today including {Time Based Coding:[id number]}.    Harlene Bogaert, AGNP-BC  [facility] [address] [place], [place] [address]  Phone [phone] Fax [phone] Note: This document was prepared with digital dictation and possible smart phrase technology. Any transcriptional errors that result from this process are unintentional.

## 2024-01-26 NOTE — Therapy (Unsigned)
 OUTPATIENT PHYSICAL THERAPY NEURO TREATMENT   Patient Name: Arthur Howard MRN: [id number] DOB:[birth year], 58 y.o., male Today's Date: 01/27/2024   PCP: Amilibia, Jaden, DO REFERRING PROVIDER: Eben Reyes BROCKS, MD  END OF SESSION:  PT End of Session - 01/27/24 0710     Visit Number 3    Number of Visits 9    Date for Recertification  03/18/24    Authorization Type [employer]    Authorization Time Period 01-11-24 - 03-23-24    PT Start Time 1017    PT Stop Time 1100    PT Time Calculation (min) 43 min    Equipment Utilized During Treatment Gait belt    Activity Tolerance Patient tolerated treatment well    Behavior During Therapy [facility] for tasks assessed/performed           Past Medical History:  Diagnosis Date   Burning with urination 08/06/2020   Diabetes mellitus without complication (HCC) 2015   Dizziness 04/16/2022   Hyperlipidemia    Increased urinary frequency 04/02/2022   Stroke (HCC) 05/06/2018   R sided weakness   Past Surgical History:  Procedure Laterality Date   ANKLE SURGERY     CLOSED REDUCTION METACARPAL WITH PERCUTANEOUS PINNING Right 07/26/2020   Procedure: CLOSED REDUCTION METACARPAL WITH PERCUTANEOUS PINNING;  Surgeon: Murrell Drivers, MD;  Location: [facility];  Service: Orthopedics;  Laterality: Right;   Patient Active Problem List   Diagnosis Date Noted   Recent cerebrovascular accident (CVA) 12/29/2023   Elevated BP reading w/ no diagnosis of HTN 12/29/2023   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 type 2 diabetes mellitus (HCC) 05/09/2021   Spasticity as late effect of cerebrovascular accident (CVA) 04/25/2021   Healthcare maintenance 04/25/2021   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 (HCC) 08/02/2018   History of CVA with residual deficit 05/11/2018   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  without macular edema, without long-term current use of insulin  (HCC) 08/12/2014    ONSET DATE: 12-28-23  REFERRING DIAG: R53.1 (ICD-10-CM) - Right sided weakness I69.351 (ICD-10-CM) - 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  Pt reporting some discomfort/soreness in Lt hamstring at start of session; area palpated - appears to have some tightness.  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 with history of CVA with right-sided weakness as late effect who presents with right sided weakness and slurred speech and was admitted for workup of an acute lacunar infarct.     Acute cerebral infarction  Patient presented to the ED on 10/7 with right sided arm and leg weakness with slurred speech since 10/5. Has a history of small vessel disease and stroke in 2020 with sustained right sided weakness and word finding difficulties. MRI of the head demonstrated an acute lacunar infarct of the left corona radiata and posterior left lentiform with choric left pontine lacunar infarct and  chronic lacunar infarcts of the right basal ganglia and frontal lobe. Patient was not in treatment window for TNK or LVO.   PAIN:  Are you having pain? No  PRECAUTIONS: Fall  RED FLAGS: None   WEIGHT BEARING RESTRICTIONS: No  FALLS: Has patient fallen in last 6 months? No  LIVING ENVIRONMENT: Lives with: lives with their family Lives in: House/apartment Stairs: No Has following equipment at home: [facility]  PLOF: Independent with basic ADLs, Independent with household mobility without device, Independent with community mobility without device, and Independent with transfers  PATIENT GOALS: increase strength in Rt arm and leg   OBJECTIVE:  Note: Objective measures were completed at Evaluation unless otherwise noted.  DIAGNOSTIC FINDINGS: IMPRESSION:MRI 12-29-23 1. Acute lacunar infarct left corona radiata and posterior left lentiform, no hemorrhage or mass effect. 2. Chronic left pontine lacunar infarct with expected evolution and associated Wallerian degeneration. 3. Chronic lacunar infarcts of the right basal ganglia, chronic frontal lobe white matter changes.    COGNITION: Overall cognitive status: Within functional limits for tasks assessed   SENSATION: Not tested  COORDINATION: Decreased RLE due to decr. strength  POSTURE: No Significant postural limitations  LOWER EXTREMITY ROM:   WNL's except Rt ankle - decreased AROM due to weakness   LOWER EXTREMITY MMT:    MMT Right Eval Left Eval  Hip flexion 4 WNL's  Hip extension    Hip abduction    Hip adduction    Hip internal rotation    Hip external rotation    Knee flexion 4   Knee extension 5   Ankle dorsiflexion 2+ - 3-   Ankle plantarflexion 3-   Ankle inversion    Ankle eversion    (Blank rows = not tested)  BED MOBILITY:  Not tested  TRANSFERS: Sit to stand: Modified independence  Assistive device utilized: None     Stand to sit: Complete Independence  Assistive device utilized: None       STAIRS: Not tested GAIT: Findings: Gait Characteristics: step through pattern and decreased ankle dorsiflexion- Right, Distance walked: 75', Assistive device utilized:None, Level of assistance: Modified independence, and Comments: increased Rt foot supination in swing/stance  FUNCTIONAL TESTS:  Timed up and go (  TUG): 15.41 secs without device without use of AFO on RLE:  14.19 secs with AFO on RLE - no device 10 meter walk test: 14.94 secs without AFO = 2.20 ft/sec;  11.66 secs with AFO on RLE = 2.81 ft/sec with AFO on RLE                                                                                                                              TREATMENT DATE: 01-26-24  TherEx:  Runner's stretch for Lt hamstrings and gastrocs 30 sec hold x 1 rep; (added this stretch to HEP) Lt heel cord stretch with 2 block placed inside // bars - pt stood on outside of bars - performed Lt heel cord stretch 30 sec hold x 1 rep (pt reports doing this stretch at home as instructed at eval)  LLE strengthening exercises: Unilateral Bridging LLE x 5 reps Bridging with RLE extension x 5 reps Lt SLR with 2# weight 5 reps Lt SLR no weight - 5 reps Lt hip flexion/extension in hooklying position - 2# - 10 reps Lt hip extension control exercise - off side of mat table - 2# weight - 10 reps Heel slide LLE - with controlled extension - 2# - 5 reps  Pt reported fatigue in LLE after completion of above exercises - but able to perform exercise on SciFit while new HEP was being put together by primary PT  SciFit level 3.0 x 5 with UE's and LE's for strengthening   TherAct: Step ups LLE 10 reps onto 6 step with RUE support 10 reps Step down exercise from 6 step for Lt eccentric quad strengthening - 10 reps with min assist  Pt stood on RLE - tapped 2nd step 10 reps for Lt hip flexor strengthening; then stood on LLE - tapped 2nd step with RLE 10 reps for LLE isometric strengthening  Updated HEP:  Access Code:  [email or web address] Date: 01/27/2024 Prepared by: Rock Kussmaul  Exercises - Supine Active Straight Leg Raise  - 1 x daily - 7 x weekly - 2 sets - 10 reps - Hip flexion in hooklying (BOTH KNEES BENT)  - 1 x daily - 7 x weekly - 2 sets - 10 reps - Single Leg Bridge  - 1 x daily - 7 x weekly - 1 sets - 10 reps - Supine Bridge with Leg Extension  - 1 x daily - 7 x weekly - 1 sets - 10 reps - Sidelying Hip Abduction  - 1 x daily - 7 x weekly - 2 sets - 10 reps - Clamshell  - 1 x daily - 7 x weekly - 2 sets - 10 reps - Seated Hamstring Curl with Anchored Resistance  - 1 x daily - 7 x weekly - 2 sets - 10 reps - Standing Gastroc Stretch  - 1 x daily - 7 x weekly - 1 sets - 1-2 reps - 20-30 sec hold   PATIENT EDUCATION: Education  details: HEP updated with additional strengthening exercises added; Medbridge [email or web address] Person educated: Patient and Child(ren) Education method: Explanation, Demonstration, and Handouts Education comprehension: verbalized understanding and returned demonstration  HOME EXERCISE PROGRAM: Access Code: [email or web address] URL: [email or web address] Date: 01/13/2024 Prepared by: Rock Kussmaul  Exercises - Standing Bilateral Gastroc Stretch with Step  - 3 x daily - 7 x weekly - 1 sets - 1-2 reps - 20-30 sec hold - Single Leg Heel Raise with Counter Support  - 2-3 x daily - 7 x weekly - 1 sets - 10 reps - Seated Eccentric Ankle Plantar Flexion with Resistance - Straight Leg  - 2-3 x daily - 7 x weekly - 1 sets - 10 reps - Seated Ankle Dorsiflexion with Resistance  - 1-2 x daily - 7 x weekly - 3 sets - 10 reps - 2 sec hold  GOALS: Goals reviewed with patient? Yes  SHORT TERM GOALS: Target date: 02-12-24  Obtain order for Rt AFO from MD. Baseline: Goal status: INITIAL  2.  Increase TUG score to </= 13.5 secs without device (without use of AFO unless obtained by this date). Baseline: 15.41 secs  Goal status: INITIAL  3.  Increase gait  velocity to >/= 2.60 ft/sec without device for increased gait efficiency.  Baseline: 14.94 secs = 2.20 ft/sec Goal status: INITIAL  4.  Independent in HEP for RLE strengthening. Baseline:  Goal status: INITIAL  5.  Perform DGI with LTG to be set.  Baseline: 13/24 (10/28) Goal status: MET   LONG TERM GOALS: Target date: 03-11-24  Pt will independently don and doff AFO on RLE.  Baseline:  Goal status: INITIAL  2.   Increase TUG score to </= 12.0 secs without device (without use of AFO unless obtained by this date). Baseline: 15.41 secs Goal status: INITIAL  3.  Increase gait velocity to >/= 3.0 ft/sec without device for increased gait efficiency.  Baseline: 14.94 secs = 2.20 ft/sec Goal status: INITIAL  4.  Improve DGI score by at least 4 points.  Baseline: 13/24 (10/28) Goal status: INITIAL  5.  Negotiate 4 steps with 1 rail using step over step sequence modified independently. Baseline:  Goal status: INITIAL  ASSESSMENT:  CLINICAL IMPRESSION: PT session focused on LLE stretching and strengthening with use of 2# weight as tolerated.  HEP was updated to include Lt hip strengthening exercises in anti-gravity positions and also added Lt hamstring/heel cord stretch to HEP.  Pt reported fatigue at end of session but stated he felt good; as able to perform SciFit exercise after mat exercises.  Continue per POC.   OBJECTIVE IMPAIRMENTS: Abnormal gait, decreased activity tolerance, decreased balance, decreased ROM, decreased strength, and impaired UE functional use.   ACTIVITY LIMITATIONS: carrying, lifting, stairs, and locomotion level  PARTICIPATION LIMITATIONS: meal prep, cleaning, driving, shopping, community activity, and yard work  PERSONAL FACTORS: Past/current experiences and 1-2 comorbidities: h/o previous Lt CVA are also affecting patient's functional outcome.   REHAB POTENTIAL: Good  CLINICAL DECISION MAKING: Evolving/moderate complexity  EVALUATION COMPLEXITY:  Moderate  PLAN:  PT FREQUENCY: 1x/week  PT DURATION: 8 weeks + eval  PLANNED INTERVENTIONS: 97110-Therapeutic exercises, 97530- Therapeutic activity, [id number]- Neuromuscular re-education, 97535- Self Care, [id number]- Gait training, [phone]- Orthotic Initial, [phone]- Orthotic/Prosthetic subsequent, and Patient/Family education  PLAN FOR NEXT SESSION: has he scheduled w/ [facility] yet? Cont LLE strengthening and balance training   Elver Stadler, [place], PT 01/27/2024, 3:39 PM

## 2024-01-27 ENCOUNTER — Encounter: Payer: Self-pay | Admitting: Adult Health

## 2024-01-27 ENCOUNTER — Encounter: Payer: Self-pay | Admitting: Physical Therapy

## 2024-01-27 ENCOUNTER — Ambulatory Visit: Admitting: Adult Health

## 2024-01-27 VITALS — BP 146/80 | HR 66 | Ht 66.0 in | Wt 141.6 lb

## 2024-01-27 DIAGNOSIS — G8111 Spastic hemiplegia affecting right dominant side: Secondary | ICD-10-CM | POA: Diagnosis not present

## 2024-01-27 DIAGNOSIS — I69319 Unspecified symptoms and signs involving cognitive functions following cerebral infarction: Secondary | ICD-10-CM | POA: Diagnosis not present

## 2024-01-27 DIAGNOSIS — Z09 Encounter for follow-up examination after completed treatment for conditions other than malignant neoplasm: Secondary | ICD-10-CM

## 2024-01-27 DIAGNOSIS — I639 Cerebral infarction, unspecified: Secondary | ICD-10-CM

## 2024-01-27 NOTE — Patient Instructions (Addendum)
 Continue to work with PT and start OT next week. Please let me know if you are interested in starting speech/cognitive therapy  Please let me know if headaches do not get better or if tightness/spasticity worsens   Continue clopidogrel  75 mg daily  and Lipitor  80mg  daily  for secondary stroke prevention  Continue to follow up with PCP regarding cholesterol, blood pressure and diabetes management  Maintain strict control of hypertension with blood pressure goal below 130/90, diabetes with hemoglobin A1c goal below 7.0 % and cholesterol with LDL cholesterol (bad cholesterol) goal below 70 mg/dL.   Signs of a Stroke? Follow the BEFAST method:  Balance Watch for a sudden loss of balance, trouble with coordination or vertigo Eyes Is there a sudden loss of vision in one or both eyes? Or double vision?  Face: Ask the person to smile. Does one side of the face droop or is it numb?  Arms: Ask the person to raise both arms. Does one arm drift downward? Is there weakness or numbness of a leg? Speech: Ask the person to repeat a simple phrase. Does the speech sound slurred/strange? Is the person confused ? Time: If you observe any of these signs, call 911.      Followup in the future with me in 4 months or call earlier if needed       Thank you for coming to see us  at [facility]. I hope we have been able to provide you high quality care today.  You may receive a patient satisfaction survey over the next few weeks. We would appreciate your feedback and comments so that we may continue to improve ourselves and the health of our patients.

## 2024-01-27 NOTE — Therapy (Signed)
 OUTPATIENT OCCUPATIONAL THERAPY NEURO EVALUATION  Patient Name: Arthur Howard MRN: [id number] DOB:09-23-1965, 58 y.o., male Today's Date: 02/02/2024  PCP: Amilbia, Jaden, DO  REFERRING PROVIDER: Francesco Elsie NOVAK, MD  END OF SESSION:  OT End of Session - 02/02/24 1038     Visit Number 1    Number of Visits 12    Date for Recertification  03/23/24    Authorization Type [employer] MCR - auth required    OT Start Time 0800    OT Stop Time 0845    OT Time Calculation (min) 45 min    Activity Tolerance Patient tolerated treatment well    Behavior During Therapy [facility] for tasks assessed/performed          Past Medical History:  Diagnosis Date   Burning with urination 08/06/2020   Diabetes mellitus without complication (HCC) 2015   Dizziness 04/16/2022   Hyperlipidemia    Increased urinary frequency 04/02/2022   Stroke (HCC) 05/06/2018   R sided weakness   Past Surgical History:  Procedure Laterality Date   ANKLE SURGERY     CLOSED REDUCTION METACARPAL WITH PERCUTANEOUS PINNING Right 07/26/2020   Procedure: CLOSED REDUCTION METACARPAL WITH PERCUTANEOUS PINNING;  Surgeon: Murrell Drivers, MD;  Location: [facility];  Service: Orthopedics;  Laterality: Right;   Patient Active Problem List   Diagnosis Date Noted   Recent cerebrovascular accident (CVA) 12/29/2023   Elevated BP reading w/ no diagnosis of HTN 12/29/2023   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 type 2 diabetes mellitus (HCC) 05/09/2021   Spasticity as late effect of cerebrovascular accident (CVA) 04/25/2021   Healthcare maintenance 04/25/2021   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 (HCC) 08/02/2018   History of CVA with residual deficit 05/11/2018   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 without macular edema, without long-term current use of insulin  (HCC) 08/12/2014    ONSET  DATE: 01/14/2024 (referral date)   REFERRING DIAG: I63.9 (ICD-10-CM) - Acute cerebral infarction (HCC) Z86.73 (ICD-10-CM) - Recent cerebrovascular accident (CVA)  THERAPY DIAG:  Right shoulder pain, unspecified chronicity  Hemiplegia and hemiparesis following cerebral infarction affecting right dominant side (HCC)  Other lack of coordination  Muscle weakness (generalized)  Unsteadiness on feet  Rationale for Evaluation and Treatment: Rehabilitation  SUBJECTIVE:   SUBJECTIVE STATEMENT: Pt reports residual weakness from first stroke in 2020, but recent stroke has made it worse Pt accompanied by: Daughter (interprets for him)   PERTINENT HISTORY: presented to ED on 12/29/23 with R UE and LE weakness MRI(+) lacunar infarct L corona radiata and posterior L lentiform plus chronic R & L lacunar infarcts  PMH L pontine CVA 2020 with R hemiplegia, HTN DM on insulin  HLD, moderate nonproliferative retinopathy  PRECAUTIONS: Other: no driving  WEIGHT BEARING RESTRICTIONS: No  PAIN:  Are you having pain? Rt calf 2/10 - no signs of DVT Pt reports pain in Rt shoulder only with movement up to 7/10   FALLS: Has patient fallen in last 6 months? No  LIVING ENVIRONMENT: Lives with: Lives w/ daughter Lives in: 1 story house w/ no steps to enter Has following equipment at home: None  PLOF: Independent and on disability from first stroke  PATIENT GOALS: work on my arm, writing  OBJECTIVE:  Note: Objective measures were completed at Evaluation unless otherwise noted.  HAND DOMINANCE: Right  ADLs: Overall ADLs: mod I  Transfers/ambulation related to ADLs: independent Eating: mod I  Grooming: mod I  UB Dressing: mod I  LB Dressing: mod I  Toileting: mod I  Bathing: mod I  Tub Shower transfers: mod I  Equipment: none  IADLs: Shopping: daughter does Light housekeeping: pt sweeps, takes out trash Meal Prep: daughter does all the cooking Community mobility: pt does not  drive Medication management: independent  Financial management: pt and daughter does together Handwriting: 100% legible and in print (name only)   MOBILITY STATUS: Independent   UPPER EXTREMITY ROM:  RUE 75% at shoulder w/ pain up to 7/10 along middle deltoid, Rt elbow distally WFL's (90% or more ROM).  LUE AROM WNL's  UPPER EXTREMITY MMT:   Not tested  HAND FUNCTION: Grip strength: Right: 52.9 lbs; Left: 70.1 lbs  COORDINATION: 9 Hole Peg test: Right: 42.97 sec; Left: 25.45 sec  SENSATION: WFL  EDEMA: none  MUSCLE TONE: RUE: Mild and Hypertonic  COGNITION: Overall cognitive status: reports decreased memory  VISION: Subjective report: sometimes he sees blurry after cataract sx, but no changes from stroke Baseline vision: none Visual history: corrective eye surgery for cataracts  VISION ASSESSMENT: To be further assessed in functional context   PERCEPTION: Not tested  PRAXIS: Not tested  OBSERVATIONS: Pt with Rt dominant side hemiparesis exacerbated from recent CVA, spanish speaking (family interprets for him), weakness noted Rt scapula                                                                                                                             TREATMENT DATE: 02/02/24    O.T. findings and POC/goals were reviewed with pt/daughter  Pt also issued supine shoulder flexion exercise BUE's - see pt instructions for details     PATIENT EDUCATION: Education details: see above Person educated: Patient and daughter Education method: Explanation, Demonstration, Verbal cues, and Handouts Education comprehension: verbalized understanding, returned demonstration, verbal cues required, and needs further education  HOME EXERCISE PROGRAM: 02/02/24: initial shoulder flexion ex in supine   GOALS: Goals reviewed with patient? Yes  SHORT TERM GOALS: Target date: 02/26/24  Independent with HEP for RUE (Shoulder, coordination, grip)  Baseline: Goal  status: IN PROGRESS   2.  Pt to verbalize understanding with pain reduction strategies for RUE  Baseline:  Goal status: INITIAL  3.  Pt to perform light IADLS (folding clothes, washing dishes, etc)  Baseline:  Goal status: INITIAL  4.  Pt to improve writing efficiency by writing paragraph with 90% or greater legibility in reasonable amount of time with A/E prn Baseline:  Goal status: INITIAL   LONG TERM GOALS: Target date: 03/18/24  Pt to perform higher level reaching RUE w/ min compensations and pain less than 5/10 to retrieve light weight objects from shelf Baseline:  Goal status: INITIAL  2.  Grip strength Rt hand to consistently be 55 lbs or greater Baseline: 52.9 lbs Goal status: INITIAL  3.  Improve coordination Rt hand as evidenced by performing 9 hole peg test in  38 sec or less Baseline: 42.97 sec Goal status: INITIAL  4.  Pt to perform light meal prep with supervision safely Baseline:  Goal status: INITIAL  5.  Pt to verbalize understanding with memory compensatory strategies Baseline:  Goal status: INITIAL    ASSESSMENT:  CLINICAL IMPRESSION: Patient is a 58 y.o. male who was seen today for occupational therapy evaluation for s/p acute CVA 12/29/23 with increased Rt hemiplegia/hemiparesis. Pt w/ h/o CVA in 2020 which affected Rt dominant side as well and left patient with some weakness, however pt reports is worse w/ recent stroke. Pt would benefit from skilled O.T. to address these deficits in RUE including pain, ROM, coordination, and strength. Pt would also benefit from further training in IADLS.  PERFORMANCE DEFICITS: in functional skills including ADLs, IADLs, coordination, dexterity, ROM, strength, pain, Fine motor control, Gross motor control, mobility, decreased knowledge of use of DME, vision, and UE functional use, cognitive skills including memory,   IMPAIRMENTS: are limiting patient from ADLs, IADLs, leisure, and social participation.    CO-MORBIDITIES: may have co-morbidities  that affects occupational performance. Patient will benefit from skilled OT to address above impairments and improve overall function.  MODIFICATION OR ASSISTANCE TO COMPLETE EVALUATION: No modification of tasks or assist necessary to complete an evaluation.  OT OCCUPATIONAL PROFILE AND HISTORY: Detailed assessment: Review of records and additional review of physical, cognitive, psychosocial history related to current functional performance.  CLINICAL DECISION MAKING: Moderate - several treatment options, min-mod task modification necessary  REHAB POTENTIAL: Good  EVALUATION COMPLEXITY: Moderate    PLAN:  OT FREQUENCY: 1-2x/week  OT DURATION: 6 weeks  PLANNED INTERVENTIONS: 97535 self care/ADL training, 02889 therapeutic exercise, 97530 therapeutic activity, 97112 neuromuscular re-education, 97140 manual therapy, 97113 aquatic therapy, 97018 paraffin, 02960 fluidotherapy, 97010 moist heat, 97760 Orthotic Initial, 97763 Orthotic/Prosthetic subsequent, passive range of motion, functional mobility training, coping strategies training, patient/family education, and DME and/or AE instructions  RECOMMENDED OTHER SERVICES: none at this time  CONSULTED AND AGREED WITH PLAN OF CARE: Patient  PLAN FOR NEXT SESSION: scapula strengthening, additional sh/scapula HEP    Burnard JINNY Roads, OT 02/02/2024, 10:39 AM

## 2024-01-30 NOTE — Progress Notes (Signed)
 I agree with the above plan

## 2024-02-02 ENCOUNTER — Encounter: Payer: Self-pay | Admitting: Physical Therapy

## 2024-02-02 ENCOUNTER — Ambulatory Visit: Admitting: Physical Therapy

## 2024-02-02 ENCOUNTER — Ambulatory Visit: Admitting: Occupational Therapy

## 2024-02-02 DIAGNOSIS — I69351 Hemiplegia and hemiparesis following cerebral infarction affecting right dominant side: Secondary | ICD-10-CM | POA: Diagnosis not present

## 2024-02-02 DIAGNOSIS — M6281 Muscle weakness (generalized): Secondary | ICD-10-CM | POA: Diagnosis not present

## 2024-02-02 DIAGNOSIS — R2681 Unsteadiness on feet: Secondary | ICD-10-CM

## 2024-02-02 DIAGNOSIS — R29818 Other symptoms and signs involving the nervous system: Secondary | ICD-10-CM

## 2024-02-02 DIAGNOSIS — R2689 Other abnormalities of gait and mobility: Secondary | ICD-10-CM

## 2024-02-02 DIAGNOSIS — R278 Other lack of coordination: Secondary | ICD-10-CM | POA: Diagnosis not present

## 2024-02-02 DIAGNOSIS — M25511 Pain in right shoulder: Secondary | ICD-10-CM

## 2024-02-02 DIAGNOSIS — R208 Other disturbances of skin sensation: Secondary | ICD-10-CM | POA: Diagnosis not present

## 2024-02-02 NOTE — Patient Instructions (Signed)
 Cranial Flexion: Overhead Arm Extension - Supine ([facility])    Acostado con rodillas flexionadas, brazos por encima de la cabeza, sostenga una bola de _0___ libras. Lleve la bola hasta tenerla directamente encima de su cara. Repita __10__ veces por rutina. Realice __1__ laurence por sesin. Realice __2__ sesiones por semana.  Copyright  VHI. All rights reserved.

## 2024-02-02 NOTE — Therapy (Signed)
 OUTPATIENT PHYSICAL THERAPY NEURO TREATMENT   Patient Name: Arthur Howard MRN: [id number] DOB:1965/12/15, 58 y.o., male Today's Date: 02/02/2024   PCP: Amilibia, Jaden, DO REFERRING PROVIDER: Eben Reyes BROCKS, MD  END OF SESSION:  PT End of Session - 02/02/24 0937     Visit Number 4    Number of Visits 9    Date for Recertification  03/18/24    Authorization Type [employer]    Authorization Time Period 01-11-24 - 03-23-24    PT Start Time 0935    PT Stop Time 1015    PT Time Calculation (min) 40 min    Equipment Utilized During Treatment Gait belt    Activity Tolerance Patient tolerated treatment well    Behavior During Therapy [facility] for tasks assessed/performed           Past Medical History:  Diagnosis Date   Burning with urination 08/06/2020   Diabetes mellitus without complication (HCC) 2015   Dizziness 04/16/2022   Hyperlipidemia    Increased urinary frequency 04/02/2022   Stroke (HCC) 05/06/2018   R sided weakness   Past Surgical History:  Procedure Laterality Date   ANKLE SURGERY     CLOSED REDUCTION METACARPAL WITH PERCUTANEOUS PINNING Right 07/26/2020   Procedure: CLOSED REDUCTION METACARPAL WITH PERCUTANEOUS PINNING;  Surgeon: Murrell Drivers, MD;  Location: [facility];  Service: Orthopedics;  Laterality: Right;   Patient Active Problem List   Diagnosis Date Noted   Recent cerebrovascular accident (CVA) 12/29/2023   Elevated BP reading w/ no diagnosis of HTN 12/29/2023   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 type 2 diabetes mellitus (HCC) 05/09/2021   Spasticity as late effect of cerebrovascular accident (CVA) 04/25/2021   Healthcare maintenance 04/25/2021   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 (HCC) 08/02/2018   History of CVA with residual deficit 05/11/2018   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  without macular edema, without long-term current use of insulin  (HCC) 08/12/2014    ONSET DATE: 12-28-23  REFERRING DIAG: R53.1 (ICD-10-CM) - Right sided weakness I69.351 (ICD-10-CM) - 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  He denies falls or acute status changes.  Low back is bothering him some this morning.  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 with history of CVA with right-sided weakness as late effect who presents with right sided weakness and slurred speech and was admitted for workup of an acute lacunar infarct.     Acute cerebral infarction  Patient presented to the ED on 10/7 with right sided arm and leg weakness with slurred speech since 10/5. Has a history of small vessel disease and stroke in 2020 with sustained right sided weakness and word finding difficulties. MRI of the head demonstrated an acute lacunar infarct of the left corona radiata and posterior left lentiform with choric left pontine lacunar infarct and chronic lacunar infarcts of the right basal ganglia and frontal lobe. Patient was not in treatment window for TNK or LVO.    PAIN:  Are you having pain? Yes: NPRS scale: 4 Pain location: low back Pain description: ache, stabbing  PRECAUTIONS: Fall  RED FLAGS: None   WEIGHT BEARING RESTRICTIONS: No  FALLS: Has patient fallen in last 6 months? No  LIVING ENVIRONMENT: Lives with: lives with their family Lives in: House/apartment Stairs: No Has following equipment at home: [facility]  PLOF: Independent with basic ADLs, Independent with household mobility without device, Independent with community mobility without device, and Independent with transfers  PATIENT GOALS: increase strength in Rt arm and leg   OBJECTIVE:  Note: Objective measures were completed at Evaluation unless otherwise noted.  DIAGNOSTIC FINDINGS: IMPRESSION:MRI 12-29-23 1. Acute lacunar infarct left corona radiata and posterior left lentiform, no hemorrhage or mass effect. 2. Chronic left pontine lacunar infarct with expected evolution and associated Wallerian degeneration. 3. Chronic lacunar infarcts of the right basal ganglia, chronic frontal lobe white matter changes.    COGNITION: Overall cognitive status: Within functional limits for tasks assessed   SENSATION: Not tested  COORDINATION: Decreased RLE due to decr. strength  POSTURE: No Significant postural limitations  LOWER EXTREMITY ROM:   WNL's except Rt ankle - decreased AROM due to weakness   LOWER EXTREMITY MMT:    MMT Right Eval Left Eval  Hip flexion 4 WNL's  Hip extension    Hip abduction    Hip adduction    Hip internal rotation    Hip external rotation    Knee flexion 4   Knee extension 5   Ankle dorsiflexion 2+ - 3-   Ankle plantarflexion 3-   Ankle inversion    Ankle eversion    (Blank rows = not tested)  BED MOBILITY:  Not tested  TRANSFERS: Sit to stand: Modified independence  Assistive device utilized: None     Stand to sit: Complete Independence  Assistive device utilized: None      STAIRS: Not tested GAIT: Findings: Gait  Characteristics: step through pattern and decreased ankle dorsiflexion- Right, Distance walked: 75', Assistive device utilized:None, Level of assistance: Modified independence, and Comments: increased Rt foot supination in swing/stance  FUNCTIONAL TESTS:  Timed up and go (TUG): 15.41 secs without device without use of AFO on RLE:  14.19 secs with AFO on RLE - no device 10 meter walk test: 14.94 secs without  AFO = 2.20 ft/sec;  11.66 secs with AFO on RLE = 2.81 ft/sec with AFO on RLE                                                                                                                              TREATMENT DATE: 02-02-24  TherEx:  SciFit level 6.0 x 8 minutes with BUE/BLE for strengthening and dynamic HIIT style workout.  Avg stride of 11.3 inches.  NMR: -Forward lunges to green dynadisc alt LE 2x10 each LE w/ BUE support -Lateral lunges to green dynadisc LE x10 each LE w/ BUE support -Lateral tilt board squats x10 no UE support > A/P tilt board squats x10 no UE support > alt forward midline toe taps 3x10 progressing to no UE support -Forward step ups onto soft bosu x10 w/ BUE support > Forward step ups onto soft bosu w/ contralateral knee drive and UE lift k89 -Lateral step up and over on soft bosu x10 each direction  PATIENT EDUCATION: Education details: HEP updated with additional strengthening exercises added; Medbridge [email or web address] Person educated: Patient and Child(ren) Education method: Explanation, Demonstration, and Handouts Education comprehension: verbalized understanding and returned demonstration  HOME EXERCISE PROGRAM: Access Code: [email or web address] URL: [email or web address] Date: 01/13/2024 Prepared by: Rock Kussmaul  Exercises - Standing Bilateral Gastroc Stretch with Step  - 3 x daily - 7 x weekly - 1 sets - 1-2 reps - 20-30 sec hold - Single Leg Heel Raise with Counter Support  - 2-3 x daily - 7 x weekly - 1 sets - 10 reps - Seated Eccentric Ankle Plantar  Flexion with Resistance - Straight Leg  - 2-3 x daily - 7 x weekly - 1 sets - 10 reps - Seated Ankle Dorsiflexion with Resistance  - 1-2 x daily - 7 x weekly - 3 sets - 10 reps - 2 sec hold  Updated HEP:  Access Code: [email or web address] Date: 01/27/2024 Prepared by: Rock Kussmaul  Exercises - Supine Active Straight Leg Raise  - 1 x daily - 7 x weekly - 2 sets - 10 reps - Hip flexion in hooklying (BOTH KNEES BENT)  - 1 x daily - 7 x weekly - 2 sets - 10 reps - Single Leg Bridge  - 1 x daily - 7 x weekly - 1 sets - 10 reps - Supine Bridge with Leg Extension  - 1 x daily - 7 x weekly - 1 sets - 10 reps - Sidelying Hip Abduction  - 1 x daily - 7 x weekly - 2 sets - 10 reps - Clamshell  - 1 x daily - 7 x weekly - 2 sets - 10 reps - Seated Hamstring Curl with Anchored Resistance  - 1 x daily - 7 x weekly - 2 sets - 10 reps - Standing Gastroc Stretch  - 1 x daily - 7 x weekly - 1 sets - 1-2 reps - 20-30 sec hold  GOALS: Goals reviewed with patient? Yes  SHORT TERM GOALS: Target date: 02-12-24  Obtain order for Rt AFO from MD. Baseline: Goal status: INITIAL  2.  Increase TUG score to </= 13.5 secs without device (without use of AFO unless obtained by this date). Baseline: 15.41 secs  Goal status: INITIAL  3.  Increase gait velocity to >/= 2.60 ft/sec without device for increased gait efficiency.  Baseline: 14.94 secs = 2.20 ft/sec Goal status: INITIAL  4.  Independent in HEP for RLE strengthening. Baseline:  Goal status: INITIAL  5.  Perform DGI with LTG to be set.  Baseline: 13/24 (10/28) Goal status: MET   LONG TERM GOALS: Target date: 03-11-24  Pt will independently don and doff AFO on RLE.  Baseline:  Goal status: INITIAL  2.   Increase TUG score to </= 12.0 secs without device (without use of AFO unless obtained by this date). Baseline: 15.41 secs Goal status: INITIAL  3.  Increase gait velocity to >/= 3.0 ft/sec without device for increased  gait efficiency.  Baseline: 14.94 secs = 2.20 ft/sec Goal status: INITIAL  4.  Improve DGI score by at least 4 points.  Baseline: 13/24 (10/28) Goal status: INITIAL  5.  Negotiate 4 steps with 1 rail using step over step sequence modified independently. Baseline:  Goal status: INITIAL  ASSESSMENT:  CLINICAL IMPRESSION: Emphasis of skilled session today on further working on SLS and loading LE w/ emphasis on ankle positioning and trunk posture to increase stability challenge.  He was challenged by level tilt board squats and positioning of particularly the left ankle on all compliant surfaces demonstrating some inversion mechanics. PT to continue addressing LE strengthening and NMR to optimize upright mobility and independence w/o AD.  Continue per POC.   OBJECTIVE IMPAIRMENTS: Abnormal gait, decreased activity tolerance, decreased balance, decreased ROM, decreased strength, and impaired UE functional use.   ACTIVITY LIMITATIONS: carrying, lifting, stairs, and locomotion level  PARTICIPATION LIMITATIONS: meal prep, cleaning, driving, shopping, community activity, and yard work  PERSONAL FACTORS: Past/current experiences and 1-2 comorbidities: h/o previous Lt CVA are also affecting patient's functional outcome.   REHAB POTENTIAL: Good  CLINICAL DECISION MAKING: Evolving/moderate complexity  EVALUATION COMPLEXITY: Moderate  PLAN:  PT FREQUENCY: 1x/week  PT DURATION: 8 weeks + eval  PLANNED INTERVENTIONS: 97110-Therapeutic exercises, 97530- Therapeutic activity, [id number]- Neuromuscular re-education, 97535- Self Care, [id number]- Gait training, [phone]- Orthotic Initial, [phone]- Orthotic/Prosthetic subsequent, and Patient/Family education  PLAN FOR NEXT SESSION: has he scheduled w/ [facility] yet? Cont LLE strengthening and balance training   Daved KATHEE Bull, PT, DPT 02/02/2024, 10:21 AM

## 2024-02-03 ENCOUNTER — Ambulatory Visit: Admitting: Occupational Therapy

## 2024-02-03 ENCOUNTER — Encounter: Payer: Self-pay | Admitting: Occupational Therapy

## 2024-02-03 DIAGNOSIS — R2681 Unsteadiness on feet: Secondary | ICD-10-CM | POA: Diagnosis not present

## 2024-02-03 DIAGNOSIS — M6281 Muscle weakness (generalized): Secondary | ICD-10-CM | POA: Diagnosis not present

## 2024-02-03 DIAGNOSIS — I69351 Hemiplegia and hemiparesis following cerebral infarction affecting right dominant side: Secondary | ICD-10-CM | POA: Diagnosis not present

## 2024-02-03 DIAGNOSIS — R29818 Other symptoms and signs involving the nervous system: Secondary | ICD-10-CM | POA: Diagnosis not present

## 2024-02-03 DIAGNOSIS — R2689 Other abnormalities of gait and mobility: Secondary | ICD-10-CM | POA: Diagnosis not present

## 2024-02-03 DIAGNOSIS — R278 Other lack of coordination: Secondary | ICD-10-CM

## 2024-02-03 DIAGNOSIS — R208 Other disturbances of skin sensation: Secondary | ICD-10-CM | POA: Diagnosis not present

## 2024-02-03 DIAGNOSIS — M25511 Pain in right shoulder: Secondary | ICD-10-CM

## 2024-02-03 NOTE — Patient Instructions (Signed)
 Cranial Flexion: Overhead Arm Extension - Supine ([facility])    Lie with knees bent, arms at thigh level holding paper towel roll or empty shoe box (palms facing). Slowly lift arms overhead. Do NOT let weak arm elbow go out to side and keep small finger side of weak hand pressed against roll or shoe box. Repeat _10___ times per set. Do ___2_ sets per day.   Scapular Retraction (Prone)   Lie with arms at sides. Pinch shoulder blades together and raise shoulders towards ceiling, NOT towards ears. Pretend you are putting shoulder blades in back pockets.  Repeat _10___ times per set. Do _2__ sets per day.    Extension - Prone (Dumbbell)    Lie with weak arm hanging off side of bed. Lift hand back and up to hip, keeping arm close to body. Do NOT lthumb turn in towards body Repeat __10__ times per set. Do ____ sets per session. Do __2__ sessions per day. NO weight   On Elbows (Prone)    Rise up on elbows as high as possible, keeping hips and belly on floor. Hold __5__ seconds, count out loud, then relax. Repeat __5__ times per set. Do __2__ sets per day.

## 2024-02-03 NOTE — Therapy (Signed)
 OUTPATIENT OCCUPATIONAL THERAPY NEURO TREATMENT  Patient Name: Arthur Howard MRN: [id number] DOB:Mar 31, 1965, 58 y.o., male Today's Date: 02/03/2024  PCP: Amilbia, Jaden, DO  REFERRING PROVIDER: Francesco Elsie NOVAK, MD  END OF SESSION:  OT End of Session - 02/03/24 1106     Visit Number 2    Number of Visits 12    Date for Recertification  03/23/24    Authorization Type [employer] MCR - auth required    OT Start Time 1103    OT Stop Time 1145    OT Time Calculation (min) 42 min    Activity Tolerance Patient tolerated treatment well    Behavior During Therapy [facility] for tasks assessed/performed          Past Medical History:  Diagnosis Date   Burning with urination 08/06/2020   Diabetes mellitus without complication (HCC) 2015   Dizziness 04/16/2022   Hyperlipidemia    Increased urinary frequency 04/02/2022   Stroke (HCC) 05/06/2018   R sided weakness   Past Surgical History:  Procedure Laterality Date   ANKLE SURGERY     CLOSED REDUCTION METACARPAL WITH PERCUTANEOUS PINNING Right 07/26/2020   Procedure: CLOSED REDUCTION METACARPAL WITH PERCUTANEOUS PINNING;  Surgeon: Murrell Drivers, MD;  Location: [facility];  Service: Orthopedics;  Laterality: Right;   Patient Active Problem List   Diagnosis Date Noted   Recent cerebrovascular accident (CVA) 12/29/2023   Elevated BP reading w/ no diagnosis of HTN 12/29/2023   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 type 2 diabetes mellitus (HCC) 05/09/2021   Spasticity as late effect of cerebrovascular accident (CVA) 04/25/2021   Healthcare maintenance 04/25/2021   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 (HCC) 08/02/2018   History of CVA with residual deficit 05/11/2018   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 without macular edema, without long-term current use of insulin  (HCC) 08/12/2014    ONSET  DATE: 01/14/2024 (referral date)   REFERRING DIAG: I63.9 (ICD-10-CM) - Acute cerebral infarction (HCC) Z86.73 (ICD-10-CM) - Recent cerebrovascular accident (CVA)  THERAPY DIAG:  Hemiplegia and hemiparesis following cerebral infarction affecting right dominant side (HCC)  Right shoulder pain, unspecified chronicity  Other lack of coordination  Muscle weakness (generalized)  Rationale for Evaluation and Treatment: Rehabilitation  SUBJECTIVE:   SUBJECTIVE STATEMENT: Doing the shoulder ex issued yesterday with improvements noted.  Pt reports residual weakness from first stroke in 2020, but recent stroke has made it worse Pt accompanied by: Daughter (interprets for him)   PERTINENT HISTORY: presented to ED on 12/29/23 with R UE and LE weakness MRI(+) lacunar infarct L corona radiata and posterior L lentiform plus chronic R & L lacunar infarcts  PMH L pontine CVA 2020 with R hemiplegia, HTN DM on insulin  HLD, moderate nonproliferative retinopathy  PRECAUTIONS: Other: no driving  WEIGHT BEARING RESTRICTIONS: No  PAIN:  Are you having pain? Rt calf 2/10 - no signs of DVT Pt reports pain in Rt shoulder only with movement up to 7/10   FALLS: Has patient fallen in last 6 months? No  LIVING ENVIRONMENT: Lives with: Lives w/ daughter Lives in: 1 story house w/ no steps to enter Has following equipment at home: None  PLOF: Independent and on disability from first stroke  PATIENT GOALS: work on my arm, writing  OBJECTIVE:  Note: Objective measures were completed at Evaluation unless otherwise noted.  HAND DOMINANCE: Right  ADLs: Overall ADLs: mod I  Transfers/ambulation related  to ADLs: independent Eating: mod I  Grooming: mod I  UB Dressing: mod I  LB Dressing: mod I  Toileting: mod I  Bathing: mod I  Tub Shower transfers: mod I  Equipment: none  IADLs: Shopping: daughter does Light housekeeping: pt sweeps, takes out trash Meal Prep: daughter does all the  cooking Community mobility: pt does not drive Medication management: independent  Financial management: pt and daughter does together Handwriting: 100% legible and in print (name only)   MOBILITY STATUS: Independent   UPPER EXTREMITY ROM:  RUE 75% at shoulder w/ pain up to 7/10 along middle deltoid, Rt elbow distally WFL's (90% or more ROM).  LUE AROM WNL's  UPPER EXTREMITY MMT:   Not tested  HAND FUNCTION: Grip strength: Right: 52.9 lbs; Left: 70.1 lbs  COORDINATION: 9 Hole Peg test: Right: 42.97 sec; Left: 25.45 sec  SENSATION: WFL  EDEMA: none  MUSCLE TONE: RUE: Mild and Hypertonic  COGNITION: Overall cognitive status: reports decreased memory  VISION: Subjective report: sometimes he sees blurry after cataract sx, but no changes from stroke Baseline vision: none Visual history: corrective eye surgery for cataracts  VISION ASSESSMENT: To be further assessed in functional context   PERCEPTION: Not tested  PRAXIS: Not tested  OBSERVATIONS: Pt with Rt dominant side hemiparesis exacerbated from recent CVA, spanish speaking (family interprets for him), weakness noted Rt scapula                                                                                                                             TREATMENT DATE: 02/03/24    Pt issued scapula HEP - see pt instructions for details. Pt demo each with daughter interpreting. Pt initially went into shoulder hiking/scapula elevation w/ scapula retraction ex, but after tactile and verbal cues, was able to correct.   Pt also shown coordination activities including: flipping over cards, dealing cards w/ thumb, rotating 1 card in hand and/or flipping 1 card over in fingers, picking up pennies, and manipulating larger coins for fingertip to/from palm translation. Pt demo each and pt's daughter wrote down each activity in Spanish for him  PATIENT EDUCATION: Education details: see above Person educated: Patient and  daughter Education method: Explanation, Demonstration, Tactile cues, Verbal cues, and Handouts Education comprehension: verbalized understanding, returned demonstration, verbal cues required, and tactile cues required  HOME EXERCISE PROGRAM: 02/02/24: initial shoulder flexion ex in supine 02/03/24: scapula HEP, coordination activities (written down by daughter in Spanish)   GOALS: Goals reviewed with patient? Yes  SHORT TERM GOALS: Target date: 02/26/24  Independent with HEP for RUE (Shoulder, coordination, grip)  Baseline: Goal status: IN PROGRESS   2.  Pt to verbalize understanding with pain reduction strategies for RUE  Baseline:  Goal status: INITIAL  3.  Pt to perform light IADLS (folding clothes, washing dishes, etc)  Baseline:  Goal status: INITIAL  4.  Pt to improve writing efficiency by writing paragraph with 90% or greater legibility in  reasonable amount of time with A/E prn Baseline:  Goal status: INITIAL   LONG TERM GOALS: Target date: 03/18/24  Pt to perform higher level reaching RUE w/ min compensations and pain less than 5/10 to retrieve light weight objects from shelf Baseline:  Goal status: INITIAL  2.  Grip strength Rt hand to consistently be 55 lbs or greater Baseline: 52.9 lbs Goal status: INITIAL  3.  Improve coordination Rt hand as evidenced by performing 9 hole peg test in 38 sec or less Baseline: 42.97 sec Goal status: INITIAL  4.  Pt to perform light meal prep with supervision safely Baseline:  Goal status: INITIAL  5.  Pt to verbalize understanding with memory compensatory strategies Baseline:  Goal status: INITIAL    ASSESSMENT:  CLINICAL IMPRESSION: Patient  seen today for occupational therapy treatment for s/p acute CVA 12/29/23 with increased Rt hemiplegia/hemiparesis. Pt w/ h/o CVA in 2020 which affected Rt dominant side as well and left patient with some weakness, however pt reports is worse w/ recent stroke. Pt already reports  improvement from initial shoulder flexion ex given yesterday at evaluation. Pt/daughter appreciative of additional ex's given today. Pt would benefit from skilled O.T. to address these deficits in RUE including pain, ROM, coordination, and strength. Pt would also benefit from further training in IADLS.  PERFORMANCE DEFICITS: in functional skills including ADLs, IADLs, coordination, dexterity, ROM, strength, pain, Fine motor control, Gross motor control, mobility, decreased knowledge of use of DME, vision, and UE functional use, cognitive skills including memory,   IMPAIRMENTS: are limiting patient from ADLs, IADLs, leisure, and social participation.   CO-MORBIDITIES: may have co-morbidities  that affects occupational performance. Patient will benefit from skilled OT to address above impairments and improve overall function.  MODIFICATION OR ASSISTANCE TO COMPLETE EVALUATION: No modification of tasks or assist necessary to complete an evaluation.  OT OCCUPATIONAL PROFILE AND HISTORY: Detailed assessment: Review of records and additional review of physical, cognitive, psychosocial history related to current functional performance.  CLINICAL DECISION MAKING: Moderate - several treatment options, min-mod task modification necessary  REHAB POTENTIAL: Good  EVALUATION COMPLEXITY: Moderate    PLAN:  OT FREQUENCY: 1-2x/week  OT DURATION: 6 weeks  PLANNED INTERVENTIONS: 97535 self care/ADL training, 02889 therapeutic exercise, 97530 therapeutic activity, 97112 neuromuscular re-education, 97140 manual therapy, 97113 aquatic therapy, 97018 paraffin, 02960 fluidotherapy, 97010 moist heat, 97760 Orthotic Initial, 97763 Orthotic/Prosthetic subsequent, passive range of motion, functional mobility training, coping strategies training, patient/family education, and DME and/or AE instructions  RECOMMENDED OTHER SERVICES: none at this time  CONSULTED AND AGREED WITH PLAN OF CARE: Patient  PLAN FOR NEXT  SESSION: review HEP's prn, continue NMR and functional use of RUE, UBE  (Check on [employer] auth)     Burnard JINNY Roads, OT 02/03/2024, 11:08 AM

## 2024-02-12 ENCOUNTER — Ambulatory Visit: Admitting: Occupational Therapy

## 2024-02-12 ENCOUNTER — Encounter: Payer: Self-pay | Admitting: Occupational Therapy

## 2024-02-12 ENCOUNTER — Encounter: Payer: Self-pay | Admitting: Physical Therapy

## 2024-02-12 ENCOUNTER — Ambulatory Visit: Admitting: Physical Therapy

## 2024-02-12 DIAGNOSIS — M6281 Muscle weakness (generalized): Secondary | ICD-10-CM

## 2024-02-12 DIAGNOSIS — R2689 Other abnormalities of gait and mobility: Secondary | ICD-10-CM | POA: Diagnosis not present

## 2024-02-12 DIAGNOSIS — I69351 Hemiplegia and hemiparesis following cerebral infarction affecting right dominant side: Secondary | ICD-10-CM

## 2024-02-12 DIAGNOSIS — R278 Other lack of coordination: Secondary | ICD-10-CM | POA: Diagnosis not present

## 2024-02-12 DIAGNOSIS — R29818 Other symptoms and signs involving the nervous system: Secondary | ICD-10-CM

## 2024-02-12 DIAGNOSIS — M25511 Pain in right shoulder: Secondary | ICD-10-CM | POA: Diagnosis not present

## 2024-02-12 DIAGNOSIS — R2681 Unsteadiness on feet: Secondary | ICD-10-CM

## 2024-02-12 DIAGNOSIS — R208 Other disturbances of skin sensation: Secondary | ICD-10-CM | POA: Diagnosis not present

## 2024-02-12 NOTE — Therapy (Signed)
 OUTPATIENT OCCUPATIONAL THERAPY NEURO TREATMENT  Patient Name: Arthur Howard MRN: [id number] DOB:[birth year], 58 y.o., male Today's Date: 02/12/2024  PCP: Amilbia, Jaden, DO  REFERRING PROVIDER: Francesco Elsie NOVAK, MD  END OF SESSION:  OT End of Session - 02/12/24 1023     Visit Number 3    Number of Visits 12    Date for Recertification  03/23/24    Authorization Type [employer] MCR - auth required    Authorization Time Period APPROVED 12 visits from 02/02/24 - 05/02/24    OT Start Time 1020    OT Stop Time 1100    OT Time Calculation (min) 40 min    Activity Tolerance Patient tolerated treatment well    Behavior During Therapy [facility] for tasks assessed/performed          Past Medical History:  Diagnosis Date   Burning with urination 08/06/2020   Diabetes mellitus without complication (HCC) 2015   Dizziness 04/16/2022   Hyperlipidemia    Increased urinary frequency 04/02/2022   Stroke (HCC) 05/06/2018   R sided weakness   Past Surgical History:  Procedure Laterality Date   ANKLE SURGERY     CLOSED REDUCTION METACARPAL WITH PERCUTANEOUS PINNING Right 07/26/2020   Procedure: CLOSED REDUCTION METACARPAL WITH PERCUTANEOUS PINNING;  Surgeon: Murrell Drivers, MD;  Location: [facility];  Service: Orthopedics;  Laterality: Right;   Patient Active Problem List   Diagnosis Date Noted   Recent cerebrovascular accident (CVA) 12/29/2023   Elevated BP reading w/ no diagnosis of HTN 12/29/2023   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 type 2 diabetes mellitus (HCC) 05/09/2021   Spasticity as late effect of cerebrovascular accident (CVA) 04/25/2021   Healthcare maintenance 04/25/2021   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 (HCC) 08/02/2018   History of CVA with residual deficit 05/11/2018   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 without macular edema,  without long-term current use of insulin  (HCC) 08/12/2014    ONSET DATE: 01/14/2024 (referral date)   REFERRING DIAG: I63.9 (ICD-10-CM) - Acute cerebral infarction (HCC) Z86.73 (ICD-10-CM) - Recent cerebrovascular accident (CVA)  THERAPY DIAG:  Hemiplegia and hemiparesis following cerebral infarction affecting right dominant side (HCC)  Right shoulder pain, unspecified chronicity  Other lack of coordination  Muscle weakness (generalized)  Unsteadiness on feet  Rationale for Evaluation and Treatment: Rehabilitation  SUBJECTIVE:   SUBJECTIVE STATEMENT: Pt reports some improvement in shoulder Pt accompanied by: Daughter (interprets for him)   PERTINENT HISTORY: presented to ED on 12/29/23 with R UE and LE weakness MRI(+) lacunar infarct L corona radiata and posterior L lentiform plus chronic R & L lacunar infarcts  PMH L pontine CVA 2020 with R hemiplegia, HTN DM on insulin  HLD, moderate nonproliferative retinopathy  PRECAUTIONS: Other: no driving  WEIGHT BEARING RESTRICTIONS: No  PAIN:  Are you having pain? Rt calf 2/10 - no signs of DVT Pt reports pain in Rt shoulder only with movement up to 7/10   FALLS: Has patient fallen in last 6 months? No  LIVING ENVIRONMENT: Lives with: Lives w/ daughter Lives in: 1 story house w/ no steps to enter Has following equipment at home: None  PLOF: Independent and on disability from first stroke  PATIENT GOALS: work on my arm, writing  OBJECTIVE:  Note: Objective measures were completed at Evaluation unless otherwise noted.  HAND DOMINANCE: Right  ADLs: Overall ADLs: mod I  Transfers/ambulation related to ADLs: independent  Eating: mod I  Grooming: mod I  UB Dressing: mod I  LB Dressing: mod I  Toileting: mod I  Bathing: mod I  Tub Shower transfers: mod I  Equipment: none  IADLs: Shopping: daughter does Light housekeeping: pt sweeps, takes out trash Meal Prep: daughter does all the cooking Community mobility: pt does  not drive Medication management: independent  Financial management: pt and daughter does together Handwriting: 100% legible and in print (name only)   MOBILITY STATUS: Independent   UPPER EXTREMITY ROM:  RUE 75% at shoulder w/ pain up to 7/10 along middle deltoid, Rt elbow distally WFL's (90% or more ROM).  LUE AROM WNL's  UPPER EXTREMITY MMT:   Not tested  HAND FUNCTION: Grip strength: Right: 52.9 lbs; Left: 70.1 lbs  COORDINATION: 9 Hole Peg test: Right: 42.97 sec; Left: 25.45 sec  SENSATION: WFL  EDEMA: none  MUSCLE TONE: RUE: Mild and Hypertonic  COGNITION: Overall cognitive status: reports decreased memory  VISION: Subjective report: sometimes he sees blurry after cataract sx, but no changes from stroke Baseline vision: none Visual history: corrective eye surgery for cataracts  VISION ASSESSMENT: To be further assessed in functional context   PERCEPTION: Not tested  PRAXIS: Not tested  OBSERVATIONS: Pt with Rt dominant side hemiparesis exacerbated from recent CVA, spanish speaking (family interprets for him), weakness noted Rt scapula                                                                                                                             TREATMENT DATE: 02/12/24    Reviewed Shoulder flexion ex in supine - pt able to perform shoulder flexion supine better w/ slightly higher range and min vc.'s and tactile cues to prevent compensation. Pt also cued in normal scapulohumeral rhythm as Rt scapula is winging and slightly elevated and goes into upward rotation before proper depression first.  Reviewed prone HEP however pt unable to perform correctly without max tactile cues (? Due to poor proprioception and/or poor body awareness). Pt required max tactile and verbal cues to perform scapula retraction and chest lift on elbows. Pt instructed not to do unless daughter there to assist (pt and daughter verbalized understanding). Therapist d/c sh extension  ex in prone for now.   Seated: worked on trunk extension and anterior pelvic tilt with cues not to tense neck, shoulders, and head - tried to disassociate movements for greater trunk/pelvic flexibility and control without compensations t/o neck musculature Also worked on wt bearing RUE while reaching up to side w/ LUE to activate lateral trunk flexion and scapula depression on Rt side as well as pushing down through RUE. Pt did this better with only initial max tactile cues, then could facilitate w/o tactile cues.   PATIENT EDUCATION: Education details: see above Person educated: Patient and daughter Education method: Explanation, Demonstration, Tactile cues, Verbal cues, and Handouts Education comprehension: verbalized understanding, returned demonstration, verbal cues required, and tactile cues required  HOME  EXERCISE PROGRAM: 02/02/24: initial shoulder flexion ex in supine 02/03/24: scapula HEP, coordination activities (written down by daughter in Spanish)   GOALS: Goals reviewed with patient? Yes  SHORT TERM GOALS: Target date: 02/26/24  Independent with HEP for RUE (Shoulder, coordination, grip)  Baseline: Goal status: IN PROGRESS   2.  Pt to verbalize understanding with pain reduction strategies for RUE  Baseline:  Goal status: INITIAL  3.  Pt to perform light IADLS (folding clothes, washing dishes, etc)  Baseline:  Goal status: INITIAL  4.  Pt to improve writing efficiency by writing paragraph with 90% or greater legibility in reasonable amount of time with A/E prn Baseline:  Goal status: INITIAL   LONG TERM GOALS: Target date: 03/18/24  Pt to perform higher level reaching RUE w/ min compensations and pain less than 5/10 to retrieve light weight objects from shelf Baseline:  Goal status: INITIAL  2.  Grip strength Rt hand to consistently be 55 lbs or greater Baseline: 52.9 lbs Goal status: INITIAL  3.  Improve coordination Rt hand as evidenced by performing 9  hole peg test in 38 sec or less Baseline: 42.97 sec Goal status: INITIAL  4.  Pt to perform light meal prep with supervision safely Baseline:  Goal status: INITIAL  5.  Pt to verbalize understanding with memory compensatory strategies Baseline:  Goal status: INITIAL    ASSESSMENT:  CLINICAL IMPRESSION: Patient  seen today for occupational therapy treatment for s/p acute CVA 12/29/23 with increased Rt hemiplegia/hemiparesis. Pt w/ h/o CVA in 2020 which affected Rt dominant side as well and left patient with some weakness, however pt reports is worse w/ recent stroke. Pt w/ decreased awareness and/or proprioception to perform correct trunk and scapulohumeral movement without tactile cues. Pt would benefit from skilled O.T. to address these deficits in RUE including pain, ROM, coordination, and strength. Pt would also benefit from further training in IADLS.  PERFORMANCE DEFICITS: in functional skills including ADLs, IADLs, coordination, dexterity, ROM, strength, pain, Fine motor control, Gross motor control, mobility, decreased knowledge of use of DME, vision, and UE functional use, cognitive skills including memory,   IMPAIRMENTS: are limiting patient from ADLs, IADLs, leisure, and social participation.   CO-MORBIDITIES: may have co-morbidities  that affects occupational performance. Patient will benefit from skilled OT to address above impairments and improve overall function.  MODIFICATION OR ASSISTANCE TO COMPLETE EVALUATION: No modification of tasks or assist necessary to complete an evaluation.  OT OCCUPATIONAL PROFILE AND HISTORY: Detailed assessment: Review of records and additional review of physical, cognitive, psychosocial history related to current functional performance.  CLINICAL DECISION MAKING: Moderate - several treatment options, min-mod task modification necessary  REHAB POTENTIAL: Good  EVALUATION COMPLEXITY: Moderate    PLAN:  OT FREQUENCY: 1-2x/week  OT  DURATION: 6 weeks  PLANNED INTERVENTIONS: 97535 self care/ADL training, 02889 therapeutic exercise, 97530 therapeutic activity, 97112 neuromuscular re-education, 97140 manual therapy, 97113 aquatic therapy, 97018 paraffin, 02960 fluidotherapy, 97010 moist heat, 97760 Orthotic Initial, 97763 Orthotic/Prosthetic subsequent, passive range of motion, functional mobility training, coping strategies training, patient/family education, and DME and/or AE instructions  RECOMMENDED OTHER SERVICES: none at this time  CONSULTED AND AGREED WITH PLAN OF CARE: Patient  PLAN FOR NEXT SESSION: focus on hand (coordination) and low range functional reaching, UBE (Check on [employer] auth)     Burnard JINNY Roads, OT 02/12/2024, 11:21 AM

## 2024-02-12 NOTE — Therapy (Signed)
 OUTPATIENT PHYSICAL THERAPY NEURO TREATMENT   Patient Name: Arthur Howard MRN: [id number] DOB:[birth year], 58 y.o., male Today's Date: 02/12/2024   PCP: Amilibia, Jaden, DO REFERRING PROVIDER: Eben Reyes BROCKS, MD  END OF SESSION:  PT End of Session - 02/12/24 0928     Visit Number 5    Number of Visits 9    Date for Recertification  03/18/24    Authorization Type [employer]    Authorization Time Period 01-11-24 - 03-23-24    PT Start Time 0926    PT Stop Time 1010    PT Time Calculation (min) 44 min    Equipment Utilized During Treatment Gait belt    Activity Tolerance Patient tolerated treatment well    Behavior During Therapy [facility] for tasks assessed/performed           Past Medical History:  Diagnosis Date   Burning with urination 08/06/2020   Diabetes mellitus without complication (HCC) 2015   Dizziness 04/16/2022   Hyperlipidemia    Increased urinary frequency 04/02/2022   Stroke (HCC) 05/06/2018   R sided weakness   Past Surgical History:  Procedure Laterality Date   ANKLE SURGERY     CLOSED REDUCTION METACARPAL WITH PERCUTANEOUS PINNING Right 07/26/2020   Procedure: CLOSED REDUCTION METACARPAL WITH PERCUTANEOUS PINNING;  Surgeon: Murrell Drivers, MD;  Location: [facility];  Service: Orthopedics;  Laterality: Right;   Patient Active Problem List   Diagnosis Date Noted   Recent cerebrovascular accident (CVA) 12/29/2023   Elevated BP reading w/ no diagnosis of HTN 12/29/2023   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 type 2 diabetes mellitus (HCC) 05/09/2021   Spasticity as late effect of cerebrovascular accident (CVA) 04/25/2021   Healthcare maintenance 04/25/2021   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 (HCC) 08/02/2018   History of CVA with residual deficit 05/11/2018   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  without macular edema, without long-term current use of insulin  (HCC) 08/12/2014    ONSET DATE: 12-28-23  REFERRING DIAG: R53.1 (ICD-10-CM) - Right sided weakness I69.351 (ICD-10-CM) - Spastic hemiplegia of right dominant side as late effect of cerebral infarction (HCC)  THERAPY DIAG:  Hemiplegia and hemiparesis following cerebral infarction affecting right dominant side (HCC)  Right shoulder pain, unspecified chronicity  Other lack of coordination  Muscle weakness (generalized)  Unsteadiness on feet  Other abnormalities of gait and mobility  Other symptoms and signs involving the nervous system  Rationale for Evaluation and Treatment: Rehabilitation  SUBJECTIVE:  SUBJECTIVE STATEMENT: Pt accompanied by daughter to PT, who interprets for patient per preference;  He has an appt on Tuesday (11/25) at 4pm with [facility].  He denies falls or acute status changes.  No pain today.  Pt accompanied by: family member - daughter   PERTINENT HISTORY: h/o Lt pontine CVA in Feb. 2020 (received PT and OT at this facility), type 2 DM   Per chart note ADISA LITT is a 58 y.o. with history of CVA with right-sided weakness as late effect who presents with right sided weakness and slurred speech and was admitted for workup of an acute lacunar infarct.     Acute cerebral infarction  Patient presented to the ED on 10/7 with right sided arm and leg weakness with slurred speech since 10/5. Has a history of small vessel disease and stroke in 2020 with sustained right sided weakness and word finding difficulties. MRI of the head demonstrated an acute lacunar infarct of the left corona radiata and posterior left lentiform with choric left pontine lacunar infarct and chronic lacunar infarcts of the right basal ganglia  and frontal lobe. Patient was not in treatment window for TNK or LVO.   PAIN:  Are you having pain? Yes: NPRS scale: 0 Pain location: low back Pain description: ache, stabbing  PRECAUTIONS: Fall  RED FLAGS: None   WEIGHT BEARING RESTRICTIONS: No  FALLS: Has patient fallen in last 6 months? No  LIVING ENVIRONMENT: Lives with: lives with their family Lives in: House/apartment Stairs: No Has following equipment at home: [facility]  PLOF: Independent with basic ADLs, Independent with household mobility without device, Independent with community mobility without device, and Independent with transfers  PATIENT GOALS: increase strength in Rt arm and leg   OBJECTIVE:  Note: Objective measures were completed at Evaluation unless otherwise noted.  DIAGNOSTIC FINDINGS: IMPRESSION:MRI 12-29-23 1. Acute lacunar infarct left corona radiata and posterior left lentiform, no hemorrhage or mass effect. 2. Chronic left pontine lacunar infarct with expected evolution and associated Wallerian degeneration. 3. Chronic lacunar infarcts of the right basal ganglia, chronic frontal lobe white matter changes.    COGNITION: Overall cognitive status: Within functional limits for tasks assessed   SENSATION: Not tested  COORDINATION: Decreased RLE due to decr. strength  POSTURE: No Significant postural limitations  LOWER EXTREMITY ROM:   WNL's except Rt ankle - decreased AROM due to weakness   LOWER EXTREMITY MMT:    MMT Right Eval Left Eval  Hip flexion 4 WNL's  Hip extension    Hip abduction    Hip adduction    Hip internal rotation    Hip external rotation    Knee flexion 4   Knee extension 5   Ankle dorsiflexion 2+ - 3-   Ankle plantarflexion 3-   Ankle inversion    Ankle eversion    (Blank rows = not tested)  BED MOBILITY:  Not tested  TRANSFERS: Sit to stand: Modified independence  Assistive device utilized: None     Stand to sit: Complete Independence  Assistive device  utilized: None      STAIRS: Not tested GAIT: Findings: Gait Characteristics: step through pattern and decreased ankle dorsiflexion- Right, Distance walked: 75', Assistive device utilized:None, Level of assistance: Modified independence, and Comments: increased Rt foot supination in swing/stance  FUNCTIONAL TESTS:  Timed up and go (TUG): 15.41 secs without device without use of AFO on RLE:  14.19 secs with AFO on RLE - no device 10 meter walk test: 14.94 secs without AFO =  2.20 ft/sec;  11.66 secs with AFO on RLE = 2.81 ft/sec with AFO on RLE                                                                                                                              TREATMENT DATE: 02-12-24  TherEx:  SciFit sprints mode level 7.0 x 8 minutes with BUE/BLE for strengthening and dynamic HIIT style workout.  Avg stride of 11.5 inches.  NMR: -Lateral tilt board crossbody squigz retrieval and high mirror placement to increase weight shifting demands and bimanual integration; x2 rounds (1 each side) -Foam beam lateral stepping w/ unsupported step taps to 8 cones (x24 taps OR 4 rounds down beam) SBA -Resisted (at waist) 6 step ups x20 > resisted lateral step ups x12 each side SBA unsupported -230' resisted gait > 230' ambulatory multi-directional perturbations > 230' 10lb farmer's carry w/ max left shoulder tactile cues to improve periscapular engagement and decrease trap activation - pt unable to maintain for prolonged periods without external cues  PATIENT EDUCATION: Education details: HEP updated with additional strengthening exercises added; Medbridge [email or web address] Person educated: Patient and Child(ren) Education method: Explanation, Demonstration, and Handouts Education comprehension: verbalized understanding and returned demonstration  HOME EXERCISE PROGRAM: Access Code: [email or web address] URL: [email or web address] Date: 01/13/2024 Prepared by: Rock Kussmaul  Exercises - Standing  Bilateral Gastroc Stretch with Step  - 3 x daily - 7 x weekly - 1 sets - 1-2 reps - 20-30 sec hold - Single Leg Heel Raise with Counter Support  - 2-3 x daily - 7 x weekly - 1 sets - 10 reps - Seated Eccentric Ankle Plantar Flexion with Resistance - Straight Leg  - 2-3 x daily - 7 x weekly - 1 sets - 10 reps - Seated Ankle Dorsiflexion with Resistance  - 1-2 x daily - 7 x weekly - 3 sets - 10 reps - 2 sec hold  Updated HEP:  Access Code: [email or web address] Date: 01/27/2024 Prepared by: Rock Kussmaul  Exercises - Supine Active Straight Leg Raise  - 1 x daily - 7 x weekly - 2 sets - 10 reps - Hip flexion in hooklying (BOTH KNEES BENT)  - 1 x daily - 7 x weekly - 2 sets - 10 reps - Single Leg Bridge  - 1 x daily - 7 x weekly - 1 sets - 10 reps - Supine Bridge with Leg Extension  - 1 x daily - 7 x weekly - 1 sets - 10 reps - Sidelying Hip Abduction  - 1 x daily - 7 x weekly - 2 sets - 10 reps - Clamshell  - 1 x daily - 7 x weekly - 2 sets - 10 reps - Seated Hamstring Curl with Anchored Resistance  - 1 x daily - 7 x weekly - 2 sets - 10 reps - Standing Gastroc Stretch  - 1 x daily - 7 x weekly - 1 sets - 1-2 reps - 20-30 sec hold  GOALS: Goals reviewed with patient? Yes  SHORT TERM GOALS: Target date: 02-12-24  Obtain order for Rt AFO from MD. Baseline:  Appt scheduled 11/25 (11/21) Goal status: MET  2.  Increase TUG score to </= 13.5 secs without device (without use of AFO unless obtained by this date). Baseline: 15.41 secs  Goal status: INITIAL  3.  Increase gait velocity to >/= 2.60 ft/sec without device for increased gait efficiency.  Baseline: 14.94 secs = 2.20 ft/sec Goal status: INITIAL  4.  Independent in HEP for RLE strengthening. Baseline: IND (11/21) Goal status: MET  5.  Perform DGI with LTG to be set.  Baseline: 13/24 (10/28) Goal status: MET   LONG TERM GOALS: Target date: 03-11-24  Pt will independently don and doff AFO on RLE.   Baseline:  Goal status: INITIAL  2.   Increase TUG score to </= 12.0 secs without device (without use of AFO unless obtained by this date). Baseline: 15.41 secs Goal status: INITIAL  3.  Increase gait velocity to >/= 3.0 ft/sec without device for increased gait efficiency.  Baseline: 14.94 secs = 2.20 ft/sec Goal status: INITIAL  4.  Improve DGI score by at least 4 points.  Baseline: 13/24 (10/28) Goal status: INITIAL  5.  Negotiate 4 steps with 1 rail using step over step sequence modified independently. Baseline:  Goal status: INITIAL  ASSESSMENT:  CLINICAL IMPRESSION: Emphasis of skilled session today on increased endurance demands and RLE strengthening dynamically.  He would benefit from further work to address postural imbalance of the upper body and over reliance on LLE in standing and with gait.  His L LE is fatiguing much quicker than his R as his stance time is still shortened on the RLE.  PT did encourage him to work on slowing steps and lengthening stride to improve efficiency of gait.  Will further address these deficits to progress towards LTGs.  Continue per POC.   OBJECTIVE IMPAIRMENTS: Abnormal gait, decreased activity tolerance, decreased balance, decreased ROM, decreased strength, and impaired UE functional use.   ACTIVITY LIMITATIONS: carrying, lifting, stairs, and locomotion level  PARTICIPATION LIMITATIONS: meal prep, cleaning, driving, shopping, community activity, and yard work  PERSONAL FACTORS: Past/current experiences and 1-2 comorbidities: h/o previous Lt CVA are also affecting patient's functional outcome.   REHAB POTENTIAL: Good  CLINICAL DECISION MAKING: Evolving/moderate complexity  EVALUATION COMPLEXITY: Moderate  PLAN:  PT FREQUENCY: 1x/week  PT DURATION: 8 weeks + eval  PLANNED INTERVENTIONS: 97110-Therapeutic exercises, 97530- Therapeutic activity, [id number]- Neuromuscular re-education, [phone]- Self Care, 02883- Gait training, [id number]-  Orthotic Initial, [phone]- Orthotic/Prosthetic subsequent, and Patient/Family education  PLAN FOR NEXT SESSION:  Cont LLE strengthening and balance training  [facility] appt 11/25 at 4pm   Daved KATHEE Bull, PT, DPT 02/12/2024, 10:19 AM

## 2024-02-16 ENCOUNTER — Ambulatory Visit: Admitting: Occupational Therapy

## 2024-02-16 DIAGNOSIS — R278 Other lack of coordination: Secondary | ICD-10-CM

## 2024-02-16 DIAGNOSIS — I69351 Hemiplegia and hemiparesis following cerebral infarction affecting right dominant side: Secondary | ICD-10-CM | POA: Diagnosis not present

## 2024-02-16 DIAGNOSIS — R2681 Unsteadiness on feet: Secondary | ICD-10-CM | POA: Diagnosis not present

## 2024-02-16 DIAGNOSIS — R29818 Other symptoms and signs involving the nervous system: Secondary | ICD-10-CM

## 2024-02-16 DIAGNOSIS — R208 Other disturbances of skin sensation: Secondary | ICD-10-CM | POA: Diagnosis not present

## 2024-02-16 DIAGNOSIS — M6281 Muscle weakness (generalized): Secondary | ICD-10-CM

## 2024-02-16 DIAGNOSIS — M25511 Pain in right shoulder: Secondary | ICD-10-CM | POA: Diagnosis not present

## 2024-02-16 DIAGNOSIS — R2689 Other abnormalities of gait and mobility: Secondary | ICD-10-CM | POA: Diagnosis not present

## 2024-02-16 NOTE — Therapy (Signed)
 OUTPATIENT OCCUPATIONAL THERAPY NEURO TREATMENT  Patient Name: Arthur Howard MRN: [id number] DOB:1965-04-07, 58 y.o., male Today's Date: 02/16/2024  PCP: Amilbia, Jaden, DO  REFERRING PROVIDER: Francesco Elsie NOVAK, MD  END OF SESSION:  OT End of Session - 02/16/24 1020     Visit Number 4    Number of Visits 12    Date for Recertification  03/23/24    Authorization Type [employer] MCR - auth required    Authorization Time Period APPROVED 12 visits from 02/02/24 - 05/02/24    OT Start Time 1019    OT Stop Time 1100    OT Time Calculation (min) 41 min    Activity Tolerance Patient tolerated treatment well    Behavior During Therapy [facility] for tasks assessed/performed         Past Medical History:  Diagnosis Date   Burning with urination 08/06/2020   Diabetes mellitus without complication (HCC) 2015   Dizziness 04/16/2022   Hyperlipidemia    Increased urinary frequency 04/02/2022   Stroke (HCC) 05/06/2018   R sided weakness   Past Surgical History:  Procedure Laterality Date   ANKLE SURGERY     CLOSED REDUCTION METACARPAL WITH PERCUTANEOUS PINNING Right 07/26/2020   Procedure: CLOSED REDUCTION METACARPAL WITH PERCUTANEOUS PINNING;  Surgeon: Murrell Drivers, MD;  Location: [facility];  Service: Orthopedics;  Laterality: Right;   Patient Active Problem List   Diagnosis Date Noted   Recent cerebrovascular accident (CVA) 12/29/2023   Elevated BP reading w/ no diagnosis of HTN 12/29/2023   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 type 2 diabetes mellitus (HCC) 05/09/2021   Spasticity as late effect of cerebrovascular accident (CVA) 04/25/2021   Healthcare maintenance 04/25/2021   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 (HCC) 08/02/2018   History of CVA with residual deficit 05/11/2018   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 without macular edema,  without long-term current use of insulin  (HCC) 08/12/2014   ONSET DATE: 01/14/2024 (referral date)   REFERRING DIAG: I63.9 (ICD-10-CM) - Acute cerebral infarction (HCC) Z86.73 (ICD-10-CM) - Recent cerebrovascular accident (CVA)  THERAPY DIAG:  Hemiplegia and hemiparesis following cerebral infarction affecting right dominant side (HCC)  Right shoulder pain, unspecified chronicity  Other lack of coordination  Muscle weakness (generalized)  Other symptoms and signs involving the nervous system  Rationale for Evaluation and Treatment: Rehabilitation  SUBJECTIVE:   SUBJECTIVE STATEMENT: Pt reports he does not remember the exercises from last session. He does not own cards at home.   Pt accompanied by: Daughter (interprets for him) - [name]  PERTINENT HISTORY: presented to ED on 12/29/23 with R UE and LE weakness MRI(+) lacunar infarct L corona radiata and posterior L lentiform plus chronic R & L lacunar infarcts  PMH L pontine CVA 2020 with R hemiplegia, HTN DM on insulin  HLD, moderate nonproliferative retinopathy  PRECAUTIONS: Other: no driving  WEIGHT BEARING RESTRICTIONS: No  PAIN:  Are you having pain? No  FALLS: Has patient fallen in last 6 months? No  LIVING ENVIRONMENT: Lives with: Lives w/ daughter Lives in: 1 story house w/ no steps to enter Has following equipment at home: None  PLOF: Independent and on disability from first stroke  PATIENT GOALS: work on my arm, writing  OBJECTIVE:  Note: Objective measures were completed at Evaluation unless otherwise noted.  HAND DOMINANCE: Right  ADLs: Overall ADLs: mod I  Transfers/ambulation related to ADLs: independent Eating: mod  I  Grooming: mod I  UB Dressing: mod I  LB Dressing: mod I  Toileting: mod I  Bathing: mod I  Tub Shower transfers: mod I  Equipment: none  IADLs: Shopping: daughter does Light housekeeping: pt sweeps, takes out trash Meal Prep: daughter does all the cooking Community mobility: pt  does not drive Medication management: independent  Financial management: pt and daughter does together Handwriting: 100% legible and in print (name only)   MOBILITY STATUS: Independent  UPPER EXTREMITY ROM:  RUE 75% at shoulder w/ pain up to 7/10 along middle deltoid, Rt elbow distally WFL's (90% or more ROM).  LUE AROM WNL's  UPPER EXTREMITY MMT:   Not tested  HAND FUNCTION: Grip strength: Right: 52.9 lbs; Left: 70.1 lbs  COORDINATION: 9 Hole Peg test: Right: 42.97 sec; Left: 25.45 sec  SENSATION: WFL  EDEMA: none  MUSCLE TONE: RUE: Mild and Hypertonic  COGNITION: Overall cognitive status: reports decreased memory  VISION: Subjective report: sometimes he sees blurry after cataract sx, but no changes from stroke Baseline vision: none Visual history: corrective eye surgery for cataracts  VISION ASSESSMENT: To be further assessed in functional context  PERCEPTION: Not tested  PRAXIS: Not tested  OBSERVATIONS: Pt with Rt dominant side hemiparesis exacerbated from recent CVA, spanish speaking (family interprets for him), weakness noted Rt scapula                                                                                                                           TREATMENT:  - Therapeutic activities completed for duration as noted below including:  OT initiated FM coordination HEP (handout provided, see pt instructions) - to improve R UE FM coordination, dexterity, proprioception. Pt returned demonstration of each exercise: Picking up objects and placing in a container with slot Stacking towers of coins  Finger-to-palm then palm-to-finger translation of small items Shuffling cards Turning cards over 1 at a time Hold deck of cards in palm of hand and push off 1 card at a time from the top of the deck using only thumb Tear piece of tissue and rolling into small balls with fingertips Meditation balls - clockwise then counterclockwise Rolling pen between fingers and  thumb, pen twirl (rotation), pen shift (translation)   OT educated pt on table top play of Golf Solitaire for RUE to address fine motor coordination. Pt required minimal cues for proper play.    PATIENT EDUCATION: Education details: see above Person educated: Patient and daughter Education method: Explanation, Demonstration, Tactile cues, Verbal cues, and Handouts Education comprehension: verbalized understanding, returned demonstration, verbal cues required, and tactile cues required  HOME EXERCISE PROGRAM: 02/02/24: initial shoulder flexion ex in supine 02/03/24: scapula HEP, coordination activities (written down by daughter in Spanish) 02/16/2024: coordination with pictures  GOALS: Goals reviewed with patient? Yes  SHORT TERM GOALS: Target date: 02/26/24  Independent with HEP for RUE (Shoulder, coordination, grip)  Baseline: Goal status: IN PROGRESS   2.  Pt to verbalize  understanding with pain reduction strategies for RUE  Baseline:  Goal status: INITIAL  3.  Pt to perform light IADLS (folding clothes, washing dishes, etc)  Baseline:  Goal status: INITIAL  4.  Pt to improve writing efficiency by writing paragraph with 90% or greater legibility in reasonable amount of time with A/E prn Baseline:  Goal status: INITIAL   LONG TERM GOALS: Target date: 03/18/24  Pt to perform higher level reaching RUE w/ min compensations and pain less than 5/10 to retrieve light weight objects from shelf Baseline:  Goal status: INITIAL  2.  Grip strength Rt hand to consistently be 55 lbs or greater Baseline: 52.9 lbs Goal status: INITIAL  3.  Improve coordination Rt hand as evidenced by performing 9 hole peg test in 38 sec or less Baseline: 42.97 sec Goal status: INITIAL  4.  Pt to perform light meal prep with supervision safely Baseline:  Goal status: INITIAL  5.  Pt to verbalize understanding with memory compensatory strategies Baseline:  Goal status:  INITIAL  ASSESSMENT:  CLINICAL IMPRESSION: Patient  seen today for occupational therapy treatment for s/p acute CVA 12/29/23 with increased Rt hemiplegia/hemiparesis. Pt demonstrates good understanding of coordination HEP and golf solitaire as needed to improve functional use of RUE.   PERFORMANCE DEFICITS: in functional skills including ADLs, IADLs, coordination, dexterity, ROM, strength, pain, Fine motor control, Gross motor control, mobility, decreased knowledge of use of DME, vision, and UE functional use, cognitive skills including memory,   IMPAIRMENTS: are limiting patient from ADLs, IADLs, leisure, and social participation.   CO-MORBIDITIES: may have co-morbidities  that affects occupational performance. Patient will benefit from skilled OT to address above impairments and improve overall function.  REHAB POTENTIAL: Good  PLAN:  OT FREQUENCY: 1-2x/week  OT DURATION: 6 weeks  PLANNED INTERVENTIONS: 97535 self care/ADL training, 02889 therapeutic exercise, 97530 therapeutic activity, 97112 neuromuscular re-education, 97140 manual therapy, 97113 aquatic therapy, 97018 paraffin, 02960 fluidotherapy, 97010 moist heat, 97760 Orthotic Initial, 97763 Orthotic/Prosthetic subsequent, passive range of motion, functional mobility training, coping strategies training, patient/family education, and DME and/or AE instructions  RECOMMENDED OTHER SERVICES: none at this time  CONSULTED AND AGREED WITH PLAN OF CARE: Patient  PLAN FOR NEXT SESSION: Provide golf solitaire handout, focus on hand (coordination) and low range functional reaching, UBE   Jocelyn CHRISTELLA Bottom, OT 02/16/2024, 4:54 PM

## 2024-02-17 ENCOUNTER — Ambulatory Visit: Admitting: Physical Therapy

## 2024-02-17 ENCOUNTER — Encounter: Payer: Self-pay | Admitting: Physical Therapy

## 2024-02-17 ENCOUNTER — Ambulatory Visit: Admitting: Occupational Therapy

## 2024-02-17 VITALS — BP 129/68 | HR 61

## 2024-02-17 DIAGNOSIS — M6281 Muscle weakness (generalized): Secondary | ICD-10-CM | POA: Diagnosis not present

## 2024-02-17 DIAGNOSIS — R29818 Other symptoms and signs involving the nervous system: Secondary | ICD-10-CM

## 2024-02-17 DIAGNOSIS — R2681 Unsteadiness on feet: Secondary | ICD-10-CM

## 2024-02-17 DIAGNOSIS — I69351 Hemiplegia and hemiparesis following cerebral infarction affecting right dominant side: Secondary | ICD-10-CM

## 2024-02-17 DIAGNOSIS — R208 Other disturbances of skin sensation: Secondary | ICD-10-CM

## 2024-02-17 DIAGNOSIS — R278 Other lack of coordination: Secondary | ICD-10-CM

## 2024-02-17 DIAGNOSIS — R2689 Other abnormalities of gait and mobility: Secondary | ICD-10-CM | POA: Diagnosis not present

## 2024-02-17 DIAGNOSIS — M25511 Pain in right shoulder: Secondary | ICD-10-CM | POA: Diagnosis not present

## 2024-02-17 NOTE — Therapy (Signed)
 OUTPATIENT OCCUPATIONAL THERAPY NEURO TREATMENT  Patient Name: Arthur Howard MRN: [id number] DOB:Nov 01, 1965, 58 y.o., male Today's Date: 02/17/2024  PCP: Amilbia, Jaden, DO  REFERRING PROVIDER: Francesco Elsie NOVAK, MD  END OF SESSION:  OT End of Session - 02/17/24 1532     Visit Number 5    Number of Visits 12    Date for Recertification  03/23/24    Authorization Type [employer] M[employer] - auth required    Authorization Time Period APPROVED 12 visits from 02/02/24 - 05/02/24    OT Start Time 1532    OT Stop Time 1625    OT Time Calculation (min) 53 min    Equipment Utilized During Treatment puzzle, Connect 4, putty, stereognosis objects    Activity Tolerance Patient tolerated treatment well    Behavior During Therapy WFL for tasks assessed/performed         Past Medical History:  Diagnosis Date   Burning with urination 08/06/2020   Diabetes mellitus without complication (HCC) 2015   Dizziness 04/16/2022   Hyperlipidemia    Increased urinary frequency 04/02/2022   Stroke (HCC) 05/06/2018   R sided weakness   Past Surgical History:  Procedure Laterality Date   ANKLE SURGERY     CLOSED REDUCTION METACARPAL WITH PERCUTANEOUS PINNING Right 07/26/2020   Procedure: CLOSED REDUCTION METACARPAL WITH PERCUTANEOUS PINNING;  Surgeon: Murrell Drivers, MD;  Location: [facility];  Service: Orthopedics;  Laterality: Right;   Patient Active Problem List   Diagnosis Date Noted   Recent cerebrovascular accident (CVA) 12/29/2023   Elevated BP reading w/ no diagnosis of HTN 12/29/2023   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 type 2 diabetes mellitus (HCC) 05/09/2021   Spasticity as late effect of cerebrovascular accident (CVA) 04/25/2021   Healthcare maintenance 04/25/2021   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 (HCC) 08/02/2018   History of CVA with residual deficit 05/11/2018   Type 2 diabetes mellitus  with both eyes affected by mild nonproliferative retinopathy without macular edema, without long-term current use of insulin  (HCC) 08/12/2014   ONSET DATE: 01/14/2024 (referral date)   REFERRING DIAG: I63.9 (ICD-10-CM) - Acute cerebral infarction (HCC) Z86.73 (ICD-10-CM) - Recent cerebrovascular accident (CVA)  THERAPY DIAG:  Other lack of coordination  Muscle weakness (generalized)  Hemiplegia and hemiparesis following cerebral infarction affecting right dominant side (HCC)  Other symptoms and signs involving the nervous system  Other disturbances of skin sensation  Rationale for Evaluation and Treatment: Rehabilitation  SUBJECTIVE:   SUBJECTIVE STATEMENT:  Pt accompanied by: Daughter (interprets for him) - [name]  Pt reports occasional discomfort with R arm even when laying in bed sometimes.  PERTINENT HISTORY: presented to ED on 12/29/23 with R UE and LE weakness MRI(+) lacunar infarct L corona radiata and posterior L lentiform plus chronic R & L lacunar infarcts  PMH L pontine CVA 2020 with R hemiplegia, HTN DM on insulin  HLD, moderate nonproliferative retinopathy  PRECAUTIONS: Other: no driving  WEIGHT BEARING RESTRICTIONS: No  PAIN:  Are you having pain? No  FALLS: Has patient fallen in last 6 months? No  LIVING ENVIRONMENT: Lives with: Lives w/ daughter Lives in: 1 story house w/ no steps to enter Has following equipment at home: None  PLOF: Independent and on disability from first stroke  PATIENT GOALS: work on my arm, writing  OBJECTIVE:  Note: Objective measures were completed at Evaluation unless otherwise noted.  HAND DOMINANCE: Right  ADLs: Overall ADLs: mod I  Transfers/ambulation related to ADLs: independent Eating: mod I  Grooming: mod I  UB Dressing: mod I  LB Dressing: mod I  Toileting: mod I  Bathing: mod I  Tub Shower transfers: mod I  Equipment: none  IADLs: Shopping: daughter does Light housekeeping: pt sweeps, takes out  trash Meal Prep: daughter does all the cooking Community mobility: pt does not drive Medication management: independent  Financial management: pt and daughter does together Handwriting: 100% legible and in print (name only)   MOBILITY STATUS: Independent  UPPER EXTREMITY ROM:  RUE 75% at shoulder w/ pain up to 7/10 along middle deltoid, Rt elbow distally WFL's (90% or more ROM).  LUE AROM WNL's  UPPER EXTREMITY MMT:   Not tested  HAND FUNCTION: Grip strength: Right: 52.9 lbs; Left: 70.1 lbs  COORDINATION: 9 Hole Peg test: Right: 42.97 sec; Left: 25.45 sec  SENSATION: WFL  EDEMA: none  MUSCLE TONE: RUE: Mild and Hypertonic  COGNITION: Overall cognitive status: reports decreased memory  VISION: Subjective report: sometimes he sees blurry after cataract sx, but no changes from stroke Baseline vision: none Visual history: corrective eye surgery for cataracts  VISION ASSESSMENT: To be further assessed in functional context  PERCEPTION: Not tested  PRAXIS: Not tested  OBSERVATIONS: Pt with Rt dominant side hemiparesis exacerbated from recent CVA, spanish speaking (family interprets for him), weakness noted Rt scapula                                                                                                                           TODAY'S TREATMENT:  - Neuro re-education completed for duration as noted below including: Pt had reported that he has some pain in his shoulder, even when he is in the bed.  Education provided re joint approximation which was then followed by weight bearing activities.  Pt participated in neuromuscular re-education activities to improve R UE motor activation, postural control, and movement quality during functional reaching tasks. Pt engaged in sustained and intermittent weight-bearing through the R UE while standing at tabletop to facilitate proprioceptive input, joint alignment, and increased motor recruitment.  OT facilitated forward and  lateral weight-shifting as pt reached across midline and to both right and left sides of table to turn over puzzle pieces, visually scan the field, and locate matching pieces. Verbal cues provided for upright posture, controlled shifting, and maintaining active WB through R UE during task. Pt tolerated activity well with improved awareness of R UE placement and increased ability to accept weight through arm with practice. - Therapeutic activities completed for duration as noted below including: Patient participated in 1 games of Connect 4 requiring minimal verbal cues for proper play and minimal verbal cues to use affected right hand with manipulation of game pieces for fine motor coordination, gross motor coordination, upper extremity range of motion, bimanual coordination/trunk control, and endurance/stamina.  Pt completed stereognosis challenge to improve sensory perception, item discrimination, and in hand manipulation. Utilized various objects for stereognosis challenge  with pt able to match 3/3 objects (fruit shapes) and then 4/4 similar peg like objects with affected hand initially with no prior awareness of objects. Pt completed challenge with no errors. Pt completed same challenges with unaffected extremity for additional feedback and noted increased ease with LUE.  Initiated Putty Exercises with yellow putty to progress strengthening, coordination and sensory stimulation of R UE.  Patient provided visual demonstration, verbal and tactile cues as needed to improve performance of the various exercises/activities including:   - Putty Squeezes - cues to squeeze putty into log for use with other exercises and to fold putty in half with 1 hand or roll it up into a cinnamon bun with 1 hand  - Putty Rolls - encourage to roll putty into logs with sensory stimulation to entire length of hand, fingers and wrist as needed   - Pinch and Pull with Putty - this motion is combined with different pinches (3-Point  Pinch, Tip Pinch, Key Pinch) - patient encouraged to combine tripod, pincer and/or key pinch with pinch and pull motion of putty pulling away from midline, changing between different pinches and changing different directions to change grip  - Finger Extension with Putty - pt shown how to work on task with all fingers and thumb as well as individual fingers in opposition to thumb  - Finger Adduction with Putty - pt shown how to work on weaving putty between fingers/thumb and then squeeze fingers together while laying hand flat on table top.  - Removing Objects from Putty  - encouraged to hide items (coins, marble, dice etc) and use one hand at a time to find the objects and identify them by tactile input before he digs them out and can see them visually.  OT educated patient on theraputty recommendations: avoid small children, pets, hot environments, place in designated container and avoid contact with fabrics. Patient verbalized understanding.    Patient benefited from extra time, verbal/tactile cues, and modeling of task to allow time for processing of verbal instructions and improve motor planning of unfamiliar movements.  PATIENT EDUCATION: Education details: see above Person educated: Patient and daughter Education method: Explanation, Demonstration, Tactile cues, Verbal cues, and Handouts Education comprehension: verbalized understanding, returned demonstration, verbal cues required, and tactile cues required  HOME EXERCISE PROGRAM: 02/02/24: initial shoulder flexion ex in supine 02/03/24: scapula HEP, coordination activities (written down by daughter in Spanish) 02/16/2024: coordination with pictures 02/17/24: Putty Activities: Access Code: [email or web address]   GOALS: Goals reviewed with patient? Yes  SHORT TERM GOALS: Target date: 02/26/24  Independent with HEP for RUE (Shoulder, coordination, grip)  Baseline: Goal status: IN PROGRESS   2.  Pt to verbalize understanding with pain  reduction strategies for RUE  Baseline:  Goal status: IN Progress  3.  Pt to perform light IADLS (folding clothes, washing dishes, etc)  Baseline:  Goal status: INITIAL  4.  Pt to improve writing efficiency by writing paragraph with 90% or greater legibility in reasonable amount of time with A/E prn Baseline:  Goal status: INITIAL   LONG TERM GOALS: Target date: 03/18/24  Pt to perform higher level reaching RUE w/ min compensations and pain less than 5/10 to retrieve light weight objects from shelf Baseline:  Goal status: INITIAL  2.  Grip strength Rt hand to consistently be 55 lbs or greater Baseline: 52.9 lbs Goal status: INITIAL  3.  Improve coordination Rt hand as evidenced by performing 9 hole peg test in 38 sec or less Baseline: 42.97 sec  Goal status: IN Progress  4.  Pt to perform light meal prep with supervision safely Baseline:  Goal status: INITIAL  5.  Pt to verbalize understanding with memory compensatory strategies Baseline:  Goal status: INITIAL  ASSESSMENT:  CLINICAL IMPRESSION: Patient seen today for occupational therapy treatment for s/p acute CVA 12/29/23 with increased Rt sided weakness. Pt demonstrates excellent participation in various activities to facilitate improve R shoulder approximation and coordination ie) weight bearing and putty activities today.  Pt will benefit from continued skilled OT services in the outpatient setting to work on impairments as noted at evaluation to help pt return to [facility] as able.    PERFORMANCE DEFICITS: in functional skills including ADLs, IADLs, coordination, dexterity, ROM, strength, pain, Fine motor control, Gross motor control, mobility, decreased knowledge of use of DME, vision, and UE functional use, cognitive skills including memory,   IMPAIRMENTS: are limiting patient from ADLs, IADLs, leisure, and social participation.   CO-MORBIDITIES: may have co-morbidities  that affects occupational performance. Patient will  benefit from skilled OT to address above impairments and improve overall function.  REHAB POTENTIAL: Good  PLAN:  OT FREQUENCY: 1-2x/week  OT DURATION: 6 weeks  PLANNED INTERVENTIONS: 97535 self care/ADL training, 02889 therapeutic exercise, 97530 therapeutic activity, 97112 neuromuscular re-education, 97140 manual therapy, 97113 aquatic therapy, 97018 paraffin, 02960 fluidotherapy, 97010 moist heat, 97760 Orthotic Initial, 97763 Orthotic/Prosthetic subsequent, passive range of motion, functional mobility training, coping strategies training, patient/family education, and DME and/or AE instructions  RECOMMENDED OTHER SERVICES: none at this time  CONSULTED AND AGREED WITH PLAN OF CARE: Patient  PLAN FOR NEXT SESSION:  Provide golf solitaire handout,  focus on hand (coordination) and low range functional reaching,  UBE  Sleep positioning education/handouts   Clarita LITTIE Pride, OT 02/17/2024, 4:56 PM

## 2024-02-17 NOTE — Patient Instructions (Signed)
 Access Code: [email or web address] URL: [email or web address] Date: 02/17/2024 Prepared by: Clarita Pride  Exercises - Putty Squeezes  - 1-2 x daily - 10 reps - Rolling Putty on Table  - 1-2 x daily - 10 reps - Finger Pinch and Pull with Putty  - 1-2 x daily - 10 reps - 3-Point Pinch with Putty  - 1-2 x daily - 10 reps - Tip PUSH with Putty  - 1-2 x daily - 10 reps - Key Pinch with Putty  - 1-2 x daily - 10 reps - Finger Extension with Putty  - 1-2 x daily - 10 reps - Finger Adduction with Putty  - 1-2 x daily - 10 reps - Removing Marbles from Putty  - 1-2 x daily - 10 reps

## 2024-02-17 NOTE — Therapy (Signed)
 OUTPATIENT PHYSICAL THERAPY NEURO TREATMENT   Patient Name: Arthur Howard MRN: [id number] DOB:November 07, 1965, 58 y.o., male Today's Date: 02/17/2024   PCP: Amilibia, Jaden, DO REFERRING PROVIDER: Eben Reyes BROCKS, MD  END OF SESSION:  PT End of Session - 02/17/24 1450     Visit Number 6    Number of Visits 9    Date for Recertification  03/18/24    Authorization Type [employer]    Authorization Time Period 01-11-24 - 03-23-24    PT Start Time 1447    PT Stop Time 1529    PT Time Calculation (min) 42 min    Equipment Utilized During Treatment Gait belt    Activity Tolerance Patient tolerated treatment well    Behavior During Therapy [facility] for tasks assessed/performed           Past Medical History:  Diagnosis Date   Burning with urination 08/06/2020   Diabetes mellitus without complication (HCC) 2015   Dizziness 04/16/2022   Hyperlipidemia    Increased urinary frequency 04/02/2022   Stroke (HCC) 05/06/2018   R sided weakness   Past Surgical History:  Procedure Laterality Date   ANKLE SURGERY     CLOSED REDUCTION METACARPAL WITH PERCUTANEOUS PINNING Right 07/26/2020   Procedure: CLOSED REDUCTION METACARPAL WITH PERCUTANEOUS PINNING;  Surgeon: Murrell Drivers, MD;  Location: [facility];  Service: Orthopedics;  Laterality: Right;   Patient Active Problem List   Diagnosis Date Noted   Recent cerebrovascular accident (CVA) 12/29/2023   Elevated BP reading w/ no diagnosis of HTN 12/29/2023   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 type 2 diabetes mellitus (HCC) 05/09/2021   Spasticity as late effect of cerebrovascular accident (CVA) 04/25/2021   Healthcare maintenance 04/25/2021   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 (HCC) 08/02/2018   History of CVA with residual deficit 05/11/2018   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  without macular edema, without long-term current use of insulin  (HCC) 08/12/2014    ONSET DATE: 12-28-23  REFERRING DIAG: R53.1 (ICD-10-CM) - Right sided weakness I69.351 (ICD-10-CM) - 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  He did have one today and another 3 days ago.  Pt accompanied by daughter to PT, who interprets for patient per preference;  He saw [facility] yesterday and they are going to check on insurance coverage of the AFO and he has another appt scheduled Dec 23.  He denies falls or acute status changes.  No pain today.  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 with history of CVA with right-sided weakness as late effect who presents with right sided weakness and slurred speech and was admitted for workup of an acute lacunar infarct.     Acute cerebral infarction  Patient presented to the ED on 10/7 with right sided arm and leg weakness with slurred speech since 10/5. Has a history of small vessel disease and stroke in 2020 with sustained right sided weakness and word finding difficulties. MRI of the head demonstrated an acute lacunar infarct of the left  corona radiata and posterior left lentiform with choric left pontine lacunar infarct and chronic lacunar infarcts of the right basal ganglia and frontal lobe. Patient was not in treatment window for TNK or LVO.   PAIN:  Are you having pain? Yes: NPRS scale: 0 Pain location: low back Pain description: ache, stabbing  PRECAUTIONS: Fall  RED FLAGS: None   WEIGHT BEARING RESTRICTIONS: No  FALLS: Has patient fallen in last 6 months? No  LIVING ENVIRONMENT: Lives with: lives with their family Lives in: House/apartment Stairs: No Has following equipment at home: [facility]  PLOF: Independent with basic ADLs, Independent with household mobility without device, Independent with community mobility without device, and Independent with transfers  PATIENT GOALS: increase strength in Rt arm and leg   OBJECTIVE:  Note: Objective measures were completed at Evaluation unless otherwise noted.  DIAGNOSTIC FINDINGS: IMPRESSION:MRI 12-29-23 1. Acute lacunar infarct left corona radiata and posterior left lentiform, no hemorrhage or mass effect. 2. Chronic left pontine lacunar infarct with expected evolution and associated Wallerian degeneration. 3. Chronic lacunar infarcts of the right basal ganglia, chronic frontal lobe white matter changes.    COGNITION: Overall cognitive status: Within functional limits for tasks assessed   SENSATION: Not tested  COORDINATION: Decreased RLE due to decr. strength  POSTURE: No Significant postural limitations  LOWER EXTREMITY ROM:   WNL's except Rt ankle - decreased AROM due to weakness   LOWER EXTREMITY MMT:    MMT Right Eval Left Eval  Hip flexion 4 WNL's  Hip extension    Hip abduction    Hip adduction    Hip internal rotation    Hip external rotation    Knee flexion 4   Knee extension 5   Ankle dorsiflexion 2+ - 3-   Ankle plantarflexion 3-   Ankle inversion    Ankle eversion    (Blank rows = not tested)  BED MOBILITY:  Not  tested  TRANSFERS: Sit to stand: Modified independence  Assistive device utilized: None     Stand to sit: Complete Independence  Assistive device utilized: None      STAIRS: Not tested GAIT: Findings: Gait Characteristics: step through pattern and decreased ankle dorsiflexion- Right, Distance walked: 75', Assistive device utilized:None, Level of assistance: Modified independence, and Comments: increased Rt foot supination in swing/stance  FUNCTIONAL TESTS:  Timed up and go (TUG): 15.41 secs without device without use of AFO on RLE:  14.19 secs with AFO on RLE - no device 10 meter walk test: 14.94 secs without AFO = 2.20 ft/sec;  11.66 secs with AFO on RLE = 2.81 ft/sec with AFO on RLE                                                                                                                              TREATMENT DATE: 02-17-24 TA: -TUG:  11.35 sec no AD SBA - : 9.09 sec no AD SBA = 1.10 m/sec OR 3.63 ft/sec  NMR: -10lb R farmer's carry x24' SBA-CGA as RLE fatigue and foot slap mildly increases -10lb R farmer's carry x16 steps progressing to IND, pt prefers step to w/ fatigue and leads w/ LLE on descent -15lb Bosu surge bimanual carry x115' in low hold > front rack surge carry x115'; SBA-CGA and tactile cues to bring right elbow in -Suitcase Bosu surge right pickup 2x10 -Bosu surge front raise 2x8; pt struggles w/ synergistic RUE movement and poor scapulohumeral rhythm - used facilitation at scapular to improve -Plank walks along countertop 3x10 ft each direction, cues for core engagement and UE approximation -Birddogs on counter x20, mild instability on RUE w/o overt LOB  PATIENT EDUCATION: Education details: Continue HEP. Person educated: Patient and Child(ren) Education method: Explanation, Demonstration, and Handouts Education comprehension: verbalized understanding and returned demonstration  HOME EXERCISE PROGRAM: Access Code: [email or web address] URL:  [email or web address] Date: 01/13/2024 Prepared by: Rock Kussmaul  Exercises - Standing Bilateral Gastroc Stretch with Step  - 3 x daily - 7 x weekly - 1 sets - 1-2 reps - 20-30 sec hold - Single Leg Heel Raise with Counter Support  - 2-3 x daily - 7 x weekly - 1 sets - 10 reps - Seated Eccentric Ankle Plantar Flexion with Resistance - Straight Leg  - 2-3 x daily - 7 x weekly - 1 sets - 10 reps - Seated Ankle Dorsiflexion with Resistance  - 1-2 x daily - 7 x weekly - 3 sets - 10 reps - 2 sec hold  Updated HEP:  Access Code: [email or web address] Date: 01/27/2024 Prepared by: Rock Kussmaul  Exercises - Supine Active Straight Leg Raise  - 1 x daily - 7 x weekly - 2 sets - 10 reps - Hip flexion in hooklying (BOTH KNEES BENT)  - 1 x daily - 7 x weekly - 2 sets - 10 reps - Single Leg Bridge  - 1 x daily - 7 x weekly - 1 sets - 10 reps - Supine Bridge with Leg Extension  - 1 x daily - 7 x weekly - 1 sets - 10 reps - Sidelying Hip Abduction  - 1 x daily - 7 x weekly - 2 sets - 10 reps - Clamshell  - 1 x daily - 7 x weekly - 2 sets - 10 reps - Seated Hamstring Curl with  Anchored Resistance  - 1 x daily - 7 x weekly - 2 sets - 10 reps - Standing Gastroc Stretch  - 1 x daily - 7 x weekly - 1 sets - 1-2 reps - 20-30 sec hold  GOALS: Goals reviewed with patient? Yes  SHORT TERM GOALS: Target date: 02-12-24  Obtain order for Rt AFO from MD. Baseline:  Appt scheduled 11/25 (11/21); saw [facility] 11/25 - awaiting information on insurance coverage (11/26) Goal status: MET  2.  Increase TUG score to </= 13.5 secs without device (without use of AFO unless obtained by this date). Baseline: 15.41 secs; 11.35 sec no AD SBA (11/26) Goal status: MET  3.  Increase gait velocity to >/= 2.60 ft/sec without device for increased gait efficiency.  Baseline: 14.94 secs = 2.20 ft/sec; 1.10 m/sec OR 3.63 ft/sec (11/26) Goal status: MET  4.  Independent in HEP for RLE  strengthening. Baseline: IND (11/21) Goal status: MET  5.  Perform DGI with LTG to be set.  Baseline: 13/24 (10/28) Goal status: MET   LONG TERM GOALS: Target date: 03-11-24  Pt will independently don and doff AFO on RLE.  Baseline:  Goal status: INITIAL  2.   Increase TUG score to </= 12.0 secs without device (without use of AFO unless obtained by this date). Baseline: 15.41 secs; 11.35 sec no AD SBA (11/26) Goal status: MET  3.  Increase gait velocity to >/= 3.83 ft/sec without device for increased gait efficiency.  Baseline: 14.94 secs = 2.20 ft/sec; 1.10 m/sec OR 3.63 ft/sec (11/26) Goal status: REVISED  4.  Improve DGI score by at least 4 points.  Baseline: 13/24 (10/28) Goal status: INITIAL  5.  Negotiate 4 steps with 1 rail using step over step sequence modified independently. Baseline:  Goal status: INITIAL  ASSESSMENT:  CLINICAL IMPRESSION: Emphasis of skilled session today on working on bilateral and bimanual integration to functional lifting tasks.  He demonstrates great core engagement w/ modified plantigrade tasks, but continues to have some RUE overhead limitations and mild static instability with increased load.  Time spent working on scapulohumeral rhythm as it impacts efficiency of lifting and carrying tasks due to muscular fatigue.  His RLE appears stronger from a functional ambulation standpoint in addition to stair management without rails.  He does better with step to pattern on stairs both for independent safety and improved endurance with task.  Will continue to address deficits as outlined in ongoing skilled PT POC as pt is making great progress towards LTGs as written as reflected by progress in gait speed and normal TUG WNL today.  OBJECTIVE IMPAIRMENTS: Abnormal gait, decreased activity tolerance, decreased balance, decreased ROM, decreased strength, and impaired UE functional use.   ACTIVITY LIMITATIONS: carrying, lifting, stairs, and locomotion  level  PARTICIPATION LIMITATIONS: meal prep, cleaning, driving, shopping, community activity, and yard work  PERSONAL FACTORS: Past/current experiences and 1-2 comorbidities: h/o previous Lt CVA are also affecting patient's functional outcome.   REHAB POTENTIAL: Good  CLINICAL DECISION MAKING: Evolving/moderate complexity  EVALUATION COMPLEXITY: Moderate  PLAN:  PT FREQUENCY: 1x/week  PT DURATION: 8 weeks + eval  PLANNED INTERVENTIONS: 97110-Therapeutic exercises, 97530- Therapeutic activity, [id number]- Neuromuscular re-education, 97535- Self Care, [id number]- Gait training, [phone]- Orthotic Initial, [phone]- Orthotic/Prosthetic subsequent, and Patient/Family education  PLAN FOR NEXT SESSION:  Cont LLE strengthening and balance training; continue functional lift/push/pull - stedy?  [facility] appt 12/23 - will insurance cover AFO?  Daved KATHEE Bull, PT, DPT 02/17/2024, 3:33 PM

## 2024-02-24 ENCOUNTER — Encounter: Payer: Self-pay | Admitting: Occupational Therapy

## 2024-02-24 ENCOUNTER — Ambulatory Visit: Admitting: Occupational Therapy

## 2024-02-24 DIAGNOSIS — M6281 Muscle weakness (generalized): Secondary | ICD-10-CM | POA: Insufficient documentation

## 2024-02-24 DIAGNOSIS — I69351 Hemiplegia and hemiparesis following cerebral infarction affecting right dominant side: Secondary | ICD-10-CM | POA: Insufficient documentation

## 2024-02-24 DIAGNOSIS — M25511 Pain in right shoulder: Secondary | ICD-10-CM | POA: Insufficient documentation

## 2024-02-24 DIAGNOSIS — R278 Other lack of coordination: Secondary | ICD-10-CM | POA: Diagnosis present

## 2024-02-24 DIAGNOSIS — R2681 Unsteadiness on feet: Secondary | ICD-10-CM | POA: Diagnosis present

## 2024-02-24 DIAGNOSIS — R208 Other disturbances of skin sensation: Secondary | ICD-10-CM | POA: Insufficient documentation

## 2024-02-24 DIAGNOSIS — R29818 Other symptoms and signs involving the nervous system: Secondary | ICD-10-CM | POA: Insufficient documentation

## 2024-02-24 DIAGNOSIS — R2689 Other abnormalities of gait and mobility: Secondary | ICD-10-CM | POA: Insufficient documentation

## 2024-02-24 NOTE — Therapy (Signed)
 OUTPATIENT OCCUPATIONAL THERAPY NEURO TREATMENT  Patient Name: Arthur Howard MRN: [id number] DOB:[birth year], 58 y.o., male Today's Date: 02/24/2024  PCP: Amilbia, Jaden, DO  REFERRING PROVIDER: Francesco Elsie NOVAK, MD  END OF SESSION:  OT End of Session - 02/24/24 0939     Visit Number 6    Number of Visits 12    Date for Recertification  03/23/24    Authorization Type [employer] MCR - auth required    Authorization Time Period APPROVED 12 visits from 02/02/24 - 05/02/24    OT Start Time 0937    OT Stop Time 1015    OT Time Calculation (min) 38 min    Equipment Utilized During Treatment puzzle, Connect 4, putty, stereognosis objects    Activity Tolerance Patient tolerated treatment well    Behavior During Therapy WFL for tasks assessed/performed         Past Medical History:  Diagnosis Date   Burning with urination 08/06/2020   Diabetes mellitus without complication (HCC) 2015   Dizziness 04/16/2022   Hyperlipidemia    Increased urinary frequency 04/02/2022   Stroke (HCC) 05/06/2018   R sided weakness   Past Surgical History:  Procedure Laterality Date   ANKLE SURGERY     CLOSED REDUCTION METACARPAL WITH PERCUTANEOUS PINNING Right 07/26/2020   Procedure: CLOSED REDUCTION METACARPAL WITH PERCUTANEOUS PINNING;  Surgeon: Murrell Drivers, MD;  Location: [facility];  Service: Orthopedics;  Laterality: Right;   Patient Active Problem List   Diagnosis Date Noted   Recent cerebrovascular accident (CVA) 12/29/2023   Elevated BP reading w/ no diagnosis of HTN 12/29/2023   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 type 2 diabetes mellitus (HCC) 05/09/2021   Spasticity as late effect of cerebrovascular accident (CVA) 04/25/2021   Healthcare maintenance 04/25/2021   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 (HCC) 08/02/2018   History of CVA with residual deficit 05/11/2018   Type 2 diabetes mellitus  with both eyes affected by mild nonproliferative retinopathy without macular edema, without long-term current use of insulin  (HCC) 08/12/2014   ONSET DATE: 01/14/2024 (referral date)   REFERRING DIAG: I63.9 (ICD-10-CM) - Acute cerebral infarction (HCC) Z86.73 (ICD-10-CM) - Recent cerebrovascular accident (CVA)  THERAPY DIAG:  Other lack of coordination  Muscle weakness (generalized)  Hemiplegia and hemiparesis following cerebral infarction affecting right dominant side (HCC)  Other symptoms and signs involving the nervous system  Other disturbances of skin sensation  Unsteadiness on feet  Rationale for Evaluation and Treatment: Rehabilitation  SUBJECTIVE:   SUBJECTIVE STATEMENT:  Pt/dtr reports RUE doing better.  Pt reports occasional discomfort with R arm even when laying in bed sometimes but otherwise no pain  Pt accompanied by: Daughter (interprets for him)   PERTINENT HISTORY: presented to ED on 12/29/23 with R UE and LE weakness MRI(+) lacunar infarct L corona radiata and posterior L lentiform plus chronic R & L lacunar infarcts  PMH L pontine CVA 2020 with R hemiplegia, HTN DM on insulin  HLD, moderate nonproliferative retinopathy  PRECAUTIONS: Other: no driving  WEIGHT BEARING RESTRICTIONS: No  PAIN:  Are you having pain? No  FALLS: Has patient fallen in last 6 months? No  LIVING ENVIRONMENT: Lives with: Lives w/ daughter Lives in: 1 story house w/ no steps to enter Has following equipment at home: None  PLOF: Independent and on disability from first stroke  PATIENT GOALS: work on my arm, writing  OBJECTIVE:  Note: Objective measures were completed at Evaluation  unless otherwise noted.  HAND DOMINANCE: Right  ADLs: Overall ADLs: mod I  Transfers/ambulation related to ADLs: independent Eating: mod I  Grooming: mod I  UB Dressing: mod I  LB Dressing: mod I  Toileting: mod I  Bathing: mod I  Tub Shower transfers: mod I  Equipment:  none  IADLs: Shopping: daughter does Light housekeeping: pt sweeps, takes out trash Meal Prep: daughter does all the cooking Community mobility: pt does not drive Medication management: independent  Financial management: pt and daughter does together Handwriting: 100% legible and in print (name only)   MOBILITY STATUS: Independent  UPPER EXTREMITY ROM:  RUE 75% at shoulder w/ pain up to 7/10 along middle deltoid, Rt elbow distally WFL's (90% or more ROM).  LUE AROM WNL's  UPPER EXTREMITY MMT:   Not tested  HAND FUNCTION: Grip strength: Right: 52.9 lbs; Left: 70.1 lbs  COORDINATION: 9 Hole Peg test: Right: 42.97 sec; Left: 25.45 sec  SENSATION: WFL  EDEMA: none  MUSCLE TONE: RUE: Mild and Hypertonic  COGNITION: Overall cognitive status: reports decreased memory  VISION: Subjective report: sometimes he sees blurry after cataract sx, but no changes from stroke Baseline vision: none Visual history: corrective eye surgery for cataracts  VISION ASSESSMENT: To be further assessed in functional context  PERCEPTION: Not tested  PRAXIS: Not tested  OBSERVATIONS: Pt with Rt dominant side hemiparesis exacerbated from recent CVA, spanish speaking (family interprets for him), weakness noted Rt scapula                                                                                                                           TODAY'S TREATMENT:   Pt issued sleep positioning handout for hemiplegia and reviewed with patient/daughter  Pt instructed in golf solitaire for visual scanning, coordination, functional use of Rt hand and cognition: pt able to perform with min v.c's. Played 2nd round with less cueing required. Handout issued on instructions for home use  Pt shown different coordination ex's to work on including: rotating golf balls in hand, manipulating pen and inching down to each end, and rotating dii over b/t first 3 fingers in numerical order  UBE x 5 min, level 3 for  normal reciprocal movement pattern and UB conditioning - cues to relax neck/shoulders and prevent sh abduction RUE  PATIENT EDUCATION: Education details: see above Person educated: Patient and daughter Education method: Explanation, Demonstration, Tactile cues, Verbal cues, and Handouts Education comprehension: verbalized understanding, returned demonstration, verbal cues required, and tactile cues required  HOME EXERCISE PROGRAM: 02/02/24: initial shoulder flexion ex in supine 02/03/24: scapula HEP, coordination activities (written down by daughter in Spanish) 02/16/2024: coordination with pictures 02/17/24: Putty Activities: Access Code: [email or web address] 02/24/24: sleep positioning handout for hemiplegia   GOALS: Goals reviewed with patient? Yes  SHORT TERM GOALS: Target date: 02/26/24  Independent with HEP for RUE (Shoulder, coordination, grip)  Baseline: Goal status: IN PROGRESS   2.  Pt to verbalize understanding with pain reduction strategies  for RUE  Baseline:  Goal status: IN Progress  3.  Pt to perform light IADLS (folding clothes, washing dishes, etc)  Baseline:  Goal status: INITIAL  4.  Pt to improve writing efficiency by writing paragraph with 90% or greater legibility in reasonable amount of time with A/E prn Baseline:  Goal status: INITIAL   LONG TERM GOALS: Target date: 03/18/24  Pt to perform higher level reaching RUE w/ min compensations and pain less than 5/10 to retrieve light weight objects from shelf Baseline:  Goal status: INITIAL  2.  Grip strength Rt hand to consistently be 55 lbs or greater Baseline: 52.9 lbs Goal status: INITIAL  3.  Improve coordination Rt hand as evidenced by performing 9 hole peg test in 38 sec or less Baseline: 42.97 sec Goal status: IN Progress  4.  Pt to perform light meal prep with supervision safely Baseline:  Goal status: INITIAL  5.  Pt to verbalize understanding with memory compensatory strategies Baseline:  Goal  status: INITIAL  ASSESSMENT:  CLINICAL IMPRESSION: Patient seen today for occupational therapy treatment for s/p acute CVA 12/29/23 with increased Rt sided weakness. Pt demonstrates excellent participation in various activities to facilitate improve R shoulder approximation and coordination.  Pt will benefit from continued skilled OT services in the outpatient setting to work on impairments as noted at evaluation to help pt return to [facility] as able.    PERFORMANCE DEFICITS: in functional skills including ADLs, IADLs, coordination, dexterity, ROM, strength, pain, Fine motor control, Gross motor control, mobility, decreased knowledge of use of DME, vision, and UE functional use, cognitive skills including memory,   IMPAIRMENTS: are limiting patient from ADLs, IADLs, leisure, and social participation.   CO-MORBIDITIES: may have co-morbidities  that affects occupational performance. Patient will benefit from skilled OT to address above impairments and improve overall function.  REHAB POTENTIAL: Good  PLAN:  OT FREQUENCY: 1-2x/week  OT DURATION: 6 weeks  PLANNED INTERVENTIONS: 97535 self care/ADL training, 02889 therapeutic exercise, 97530 therapeutic activity, 97112 neuromuscular re-education, 97140 manual therapy, 97113 aquatic therapy, 97018 paraffin, 02960 fluidotherapy, 97010 moist heat, 97760 Orthotic Initial, 97763 Orthotic/Prosthetic subsequent, passive range of motion, functional mobility training, coping strategies training, patient/family education, and DME and/or AE instructions  RECOMMENDED OTHER SERVICES: none at this time  CONSULTED AND AGREED WITH PLAN OF CARE: Patient  PLAN FOR NEXT SESSION:  Assess STG's  UBE ? How sleep is going, ? Adding visits into January and renewal end of year    Burnard JINNY Roads, OT 02/24/2024, 9:39 AM

## 2024-02-26 ENCOUNTER — Ambulatory Visit: Admitting: Physical Therapy

## 2024-02-26 ENCOUNTER — Ambulatory Visit: Admitting: Occupational Therapy

## 2024-02-26 ENCOUNTER — Encounter: Payer: Self-pay | Admitting: Physical Therapy

## 2024-02-26 VITALS — BP 143/67 | HR 63

## 2024-02-26 DIAGNOSIS — R29818 Other symptoms and signs involving the nervous system: Secondary | ICD-10-CM

## 2024-02-26 DIAGNOSIS — I69351 Hemiplegia and hemiparesis following cerebral infarction affecting right dominant side: Secondary | ICD-10-CM

## 2024-02-26 DIAGNOSIS — M6281 Muscle weakness (generalized): Secondary | ICD-10-CM

## 2024-02-26 DIAGNOSIS — R208 Other disturbances of skin sensation: Secondary | ICD-10-CM

## 2024-02-26 DIAGNOSIS — R2689 Other abnormalities of gait and mobility: Secondary | ICD-10-CM

## 2024-02-26 DIAGNOSIS — R278 Other lack of coordination: Secondary | ICD-10-CM

## 2024-02-26 DIAGNOSIS — R2681 Unsteadiness on feet: Secondary | ICD-10-CM

## 2024-02-26 NOTE — Patient Instructions (Signed)

## 2024-02-26 NOTE — Therapy (Signed)
 OUTPATIENT OCCUPATIONAL THERAPY NEURO TREATMENT  Patient Name: Arthur Howard MRN: [id number] DOB:[birth year], 58 y.o., male Today's Date: 02/26/2024  PCP: Amilbia, Jaden, DO  REFERRING PROVIDER: Francesco Elsie NOVAK, MD  END OF SESSION:  OT End of Session - 02/26/24 1108     Visit Number 7    Number of Visits 12    Date for Recertification  03/23/24    Authorization Type [employer] MCR - auth required    Authorization Time Period APPROVED 12 visits from 02/02/24 - 05/02/24    OT Start Time 1107    OT Stop Time 1145    OT Time Calculation (min) 38 min    Equipment Utilized During Treatment --    Activity Tolerance Patient tolerated treatment well    Behavior During Therapy [facility] for tasks assessed/performed         Past Medical History:  Diagnosis Date   Burning with urination 08/06/2020   Diabetes mellitus without complication (HCC) 2015   Dizziness 04/16/2022   Hyperlipidemia    Increased urinary frequency 04/02/2022   Stroke (HCC) 05/06/2018   R sided weakness   Past Surgical History:  Procedure Laterality Date   ANKLE SURGERY     CLOSED REDUCTION METACARPAL WITH PERCUTANEOUS PINNING Right 07/26/2020   Procedure: CLOSED REDUCTION METACARPAL WITH PERCUTANEOUS PINNING;  Surgeon: Murrell Drivers, MD;  Location: [facility];  Service: Orthopedics;  Laterality: Right;   Patient Active Problem List   Diagnosis Date Noted   Recent cerebrovascular accident (CVA) 12/29/2023   Elevated BP reading w/ no diagnosis of HTN 12/29/2023   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 type 2 diabetes mellitus (HCC) 05/09/2021   Spasticity as late effect of cerebrovascular accident (CVA) 04/25/2021   Healthcare maintenance 04/25/2021   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 (HCC) 08/02/2018   History of CVA with residual deficit 05/11/2018   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 without macular edema, without long-term current use of insulin  (HCC) 08/12/2014   ONSET DATE: 01/14/2024 (referral date)   REFERRING DIAG: I63.9 (ICD-10-CM) - Acute cerebral infarction (HCC) Z86.73 (ICD-10-CM) - Recent cerebrovascular accident (CVA)  THERAPY DIAG:  Other lack of coordination  Muscle weakness (generalized)  Hemiplegia and hemiparesis following cerebral infarction affecting right dominant side (HCC)  Other symptoms and signs involving the nervous system  Other disturbances of skin sensation  Rationale for Evaluation and Treatment: Rehabilitation  SUBJECTIVE:   SUBJECTIVE STATEMENT:  He has a heating pad at home but does not use it.   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 No  FALLS: Has patient fallen in last 6 months? No  LIVING ENVIRONMENT: Lives with: Lives w/ daughter Lives in: 1 story house w/ no steps to enter Has following equipment at home: None  PLOF: Independent and on disability from first stroke  PATIENT GOALS: work on my arm, writing  OBJECTIVE:  Note: Objective measures were completed at Evaluation unless otherwise noted.  HAND DOMINANCE: Right  ADLs: Overall ADLs: mod I  Transfers/ambulation related to ADLs: independent  Eating: mod I  Grooming: mod I  UB Dressing: mod I  LB Dressing: mod I  Toileting: mod I  Bathing: mod I  Tub Shower transfers: mod I  Equipment: none  IADLs: Shopping: daughter does Light housekeeping: pt sweeps, takes out trash Meal Prep: daughter does all the cooking Community mobility: pt does  not drive Medication management: independent  Financial management: pt and daughter does together Handwriting: 100% legible and in print (name only)   MOBILITY STATUS: Independent  UPPER EXTREMITY ROM:  RUE 75% at shoulder w/ pain up to 7/10 along middle deltoid, Rt elbow distally WFL's (90% or more ROM).  LUE AROM WNL's  UPPER EXTREMITY MMT:   Not tested  HAND FUNCTION: Grip strength: Right: 52.9 lbs; Left: 70.1 lbs  COORDINATION: 9 Hole Peg test: Right: 42.97 sec; Left: 25.45 sec  SENSATION: WFL  EDEMA: none  MUSCLE TONE: RUE: Mild and Hypertonic  COGNITION: Overall cognitive status: reports decreased memory  VISION: Subjective report: sometimes he sees blurry after cataract sx, but no changes from stroke Baseline vision: none Visual history: corrective eye surgery for cataracts  VISION ASSESSMENT: To be further assessed in functional context  PERCEPTION: Not tested  PRAXIS: Not tested  OBSERVATIONS: Pt with Rt dominant side hemiparesis exacerbated from recent CVA, spanish speaking (family interprets for him), weakness noted Rt scapula                                                                                                                           TODAY'S TREATMENT:   Therapist reviewed goals with patient and updated patient progression.  No additional functional limitations identified. Objective measures assessed as noted in Goals section to determine progression towards goals. OT educated pt on the use of heat to reduce pain and promote more desirable circulation in conjunction with stretching.  Patient was educated on memory compensation strategies to support cognitive functioning and independence in daily activities. The following strategies were introduced and discussed: WARM strategy (Write it down, Associate it, Repeat it, Make a mental picture) to enhance memory encoding and retrieval. Use of a Memory Notebook with organized sections (calendar,  contacts, medications, daily journal) to support structured recall. Implementation of daily schedules and calendar use for routine planning. Use of medication organizers and assistance with setup as needed. Environmental modifications including a basket/pegboard system near the exit for essential items and reminders. Placement of sticky notes in task-relevant locations to cue memory. Use of alarms, timers, and reminder apps for task initiation and completion. Introduction to voice recording tools for capturing and reviewing important information.   PATIENT EDUCATION: Education details: see above Person educated: Patient and daughter Education method: Explanation, Demonstration, Tactile cues, Verbal cues, and Handouts Education comprehension: verbalized understanding, returned demonstration, verbal cues required, and tactile cues required  HOME EXERCISE PROGRAM: 02/02/24: initial shoulder flexion ex in supine 02/03/24: scapula HEP, coordination activities (written down by daughter in Spanish) 02/16/2024: coordination with pictures 02/17/24: Putty Activities: Access Code:  [email or web address] 02/24/24: sleep positioning handout for hemiplegia   GOALS: Goals reviewed with patient? Yes  SHORT TERM GOALS: Target date: 02/26/24  Independent with HEP for RUE (Shoulder, coordination, grip)  Baseline: Goal status: MET  2.  Pt to verbalize understanding with pain reduction strategies for RUE  Baseline:  Goal status: MET  3.  Pt to perform light IADLS (folding clothes, washing dishes, etc)  Baseline:  Goal status: MET  4.  Pt to improve writing efficiency by writing paragraph with 90% or greater legibility in reasonable amount of time with A/E prn Baseline:  Goal status: MET   LONG TERM GOALS: Target date: 03/18/24  Pt to perform higher level reaching RUE w/ min compensations and pain less than 5/10 to retrieve light weight objects from shelf Baseline:  Goal status: INITIAL  2.  Grip strength  Rt hand to consistently be 55 lbs or greater Baseline: 52.9 lbs 02/26/2024: 59 lbs Goal status: MET  3.  Improve coordination Rt hand as evidenced by performing 9 hole peg test in 38 sec or less Baseline: 42.97 sec 02/26/2024: 32 sec Goal status: MET  4.  Pt to perform light meal prep with supervision safely Baseline:  Goal status: INITIAL  5.  Pt to verbalize understanding with memory compensatory strategies Baseline:  Goal status: MET  ASSESSMENT:  CLINICAL IMPRESSION: Patient seen today for occupational therapy treatment for s/p acute CVA 12/29/23 with increased Rt sided weakness. Pt has met all STGs and is making good progress towards LTGs. Recommend more consistent use of heating pad for management of RUE pain and stiffness and to promote more functional use.   PERFORMANCE DEFICITS: in functional skills including ADLs, IADLs, coordination, dexterity, ROM, strength, pain, Fine motor control, Gross motor control, mobility, decreased knowledge of use of DME, vision, and UE functional use, cognitive skills including memory,   IMPAIRMENTS: are limiting patient from ADLs, IADLs, leisure, and social participation.   CO-MORBIDITIES: may have co-morbidities  that affects occupational performance. Patient will benefit from skilled OT to address above impairments and improve overall function.  REHAB POTENTIAL: Good  PLAN:  OT FREQUENCY: 1-2x/week  OT DURATION: 6 weeks  PLANNED INTERVENTIONS: 97535 self care/ADL training, 02889 therapeutic exercise, 97530 therapeutic activity, 97112 neuromuscular re-education, 97140 manual therapy, 97113 aquatic therapy, 97018 paraffin, 02960 fluidotherapy, 97010 moist heat, 97760 Orthotic Initial, 97763 Orthotic/Prosthetic subsequent, passive range of motion, functional mobility training, coping strategies training, patient/family education, and DME and/or AE instructions  RECOMMENDED OTHER SERVICES: none at this time  CONSULTED AND AGREED WITH PLAN  OF CARE: Patient  PLAN FOR NEXT SESSION: reaching activities UBE ? How sleep is going, ? Adding visits into January and renewal end of year    Jocelyn CHRISTELLA Bottom, [place] 02/26/2024, 11:09 AM

## 2024-02-26 NOTE — Therapy (Signed)
 OUTPATIENT PHYSICAL THERAPY NEURO TREATMENT   Patient Name: Arthur Howard MRN: [id number] DOB:18-Nov-1965, 58 y.o., male Today's Date: 02/26/2024   PCP: Amilibia, Jaden, DO REFERRING PROVIDER: Eben Reyes BROCKS, MD  END OF SESSION:  PT End of Session - 02/26/24 1025     Visit Number 7    Number of Visits 9    Date for Recertification  03/18/24    Authorization Type [employer]    Authorization Time Period 01-11-24 - 03-23-24    PT Start Time 1017    PT Stop Time 1100    PT Time Calculation (min) 43 min    Equipment Utilized During Treatment Gait belt    Activity Tolerance Patient tolerated treatment well    Behavior During Therapy [facility] for tasks assessed/performed           Past Medical History:  Diagnosis Date   Burning with urination 08/06/2020   Diabetes mellitus without complication (HCC) 2015   Dizziness 04/16/2022   Hyperlipidemia    Increased urinary frequency 04/02/2022   Stroke (HCC) 05/06/2018   R sided weakness   Past Surgical History:  Procedure Laterality Date   ANKLE SURGERY     CLOSED REDUCTION METACARPAL WITH PERCUTANEOUS PINNING Right 07/26/2020   Procedure: CLOSED REDUCTION METACARPAL WITH PERCUTANEOUS PINNING;  Surgeon: Murrell Drivers, MD;  Location: [facility];  Service: Orthopedics;  Laterality: Right;   Patient Active Problem List   Diagnosis Date Noted   Recent cerebrovascular accident (CVA) 12/29/2023   Elevated BP reading w/ no diagnosis of HTN 12/29/2023   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 type 2 diabetes mellitus (HCC) 05/09/2021   Spasticity as late effect of cerebrovascular accident (CVA) 04/25/2021   Healthcare maintenance 04/25/2021   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 (HCC) 08/02/2018   History of CVA with residual deficit 05/11/2018   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  without macular edema, without long-term current use of insulin  (HCC) 08/12/2014    ONSET DATE: 12-28-23  REFERRING DIAG: R53.1 (ICD-10-CM) - Right sided weakness I69.351 (ICD-10-CM) - Spastic hemiplegia of right dominant side as late effect of cerebral infarction (HCC)  THERAPY DIAG:  Other lack of coordination  Muscle weakness (generalized)  Hemiplegia and hemiparesis following cerebral infarction affecting right dominant side (HCC)  Other symptoms and signs involving the nervous system  Other disturbances of skin sensation  Unsteadiness on feet  Other abnormalities of gait and mobility  Rationale for Evaluation and Treatment: Rehabilitation  SUBJECTIVE:  SUBJECTIVE STATEMENT: Pt reports intermittent dizziness this morning, thinks it is related to his medicines.  Pt accompanied by daughter to PT, who interprets for patient per preference; He denies falls or acute status changes.  No pain today.  Pt accompanied by: family member - daughter   PERTINENT HISTORY: h/o Lt pontine CVA in Feb. 2020 (received PT and OT at this facility), type 2 DM   Per chart note Arthur Howard is a 57 y.o. with history of CVA with right-sided weakness as late effect who presents with right sided weakness and slurred speech and was admitted for workup of an acute lacunar infarct.     Acute cerebral infarction  Patient presented to the ED on 10/7 with right sided arm and leg weakness with slurred speech since 10/5. Has a history of small vessel disease and stroke in 2020 with sustained right sided weakness and word finding difficulties. MRI of the head demonstrated an acute lacunar infarct of the left corona radiata and posterior left lentiform with choric left pontine lacunar infarct and chronic lacunar infarcts of  the right basal ganglia and frontal lobe. Patient was not in treatment window for TNK or LVO.   PAIN:  Are you having pain? Yes: NPRS scale: 0 Pain location: low back Pain description: ache, stabbing  PRECAUTIONS: Fall  RED FLAGS: None   WEIGHT BEARING RESTRICTIONS: No  FALLS: Has patient fallen in last 6 months? No  LIVING ENVIRONMENT: Lives with: lives with their family Lives in: House/apartment Stairs: No Has following equipment at home: [facility]  PLOF: Independent with basic ADLs, Independent with household mobility without device, Independent with community mobility without device, and Independent with transfers  PATIENT GOALS: increase strength in Rt arm and leg   OBJECTIVE:  Note: Objective measures were completed at Evaluation unless otherwise noted.  DIAGNOSTIC FINDINGS: IMPRESSION:MRI 12-29-23 1. Acute lacunar infarct left corona radiata and posterior left lentiform, no hemorrhage or mass effect. 2. Chronic left pontine lacunar infarct with expected evolution and associated Wallerian degeneration. 3. Chronic lacunar infarcts of the right basal ganglia, chronic frontal lobe white matter changes.    COGNITION: Overall cognitive status: Within functional limits for tasks assessed   SENSATION: Not tested  COORDINATION: Decreased RLE due to decr. strength  POSTURE: No Significant postural limitations  LOWER EXTREMITY ROM:   WNL's except Rt ankle - decreased AROM due to weakness   LOWER EXTREMITY MMT:    MMT Right Eval Left Eval  Hip flexion 4 WNL's  Hip extension    Hip abduction    Hip adduction    Hip internal rotation    Hip external rotation    Knee flexion 4   Knee extension 5   Ankle dorsiflexion 2+ - 3-   Ankle plantarflexion 3-   Ankle inversion    Ankle eversion    (Blank rows = not tested)  BED MOBILITY:  Not tested  TRANSFERS: Sit to stand: Modified independence  Assistive device utilized: None     Stand to sit: Complete  Independence  Assistive device utilized: None      STAIRS: Not tested GAIT: Findings: Gait Characteristics: step through pattern and decreased ankle dorsiflexion- Right, Distance walked: 75', Assistive device utilized:None, Level of assistance: Modified independence, and Comments: increased Rt foot supination in swing/stance  FUNCTIONAL TESTS:  Timed up and go (TUG): 15.41 secs without device without use of AFO on RLE:  14.19 secs with AFO on RLE - no device 10 meter walk test: 14.94 secs without  AFO = 2.20 ft/sec;  11.66 secs with AFO on RLE = 2.81 ft/sec with AFO on RLE                                                                                                                              TREATMENT DATE: 02/26/24 NMR: -117 lb Stedy push bimanually x230' > 117 lb Stedy pull bimanually x230' -117 lb Stedy push w/ RUE only x230' > 117 lb Stedy pull RUE only x230' -30lb crate drag RUE x30 ft > repeated w/ LUE for pt to feel difference on each side -Attempted 8lb lateral lunges but pt limited in strength and ROM so regressed to ballet bar supported cosack squats 2x20 > Ballet bar supported anterior/posterior weight shift into squat 2x8 alt foot positioning for workload  PATIENT EDUCATION: Education details: Continue HEP.  Plan to assess LTGs at last scheduled visit and determine further POC. Person educated: Patient and Child(ren) Education method: Explanation, Demonstration, and Handouts Education comprehension: verbalized understanding and returned demonstration  HOME EXERCISE PROGRAM: Access Code: [email or web address] URL: [email or web address] Date: 01/13/2024 Prepared by: Rock Kussmaul  Exercises - Standing Bilateral Gastroc Stretch with Step  - 3 x daily - 7 x weekly - 1 sets - 1-2 reps - 20-30 sec hold - Single Leg Heel Raise with Counter Support  - 2-3 x daily - 7 x weekly - 1 sets - 10 reps - Seated Eccentric Ankle Plantar Flexion with Resistance - Straight Leg  - 2-3 x  daily - 7 x weekly - 1 sets - 10 reps - Seated Ankle Dorsiflexion with Resistance  - 1-2 x daily - 7 x weekly - 3 sets - 10 reps - 2 sec hold  Updated HEP:  Access Code: [email or web address] Date: 01/27/2024 Prepared by: Rock Kussmaul  Exercises - Supine Active Straight Leg Raise  - 1 x daily - 7 x weekly - 2 sets - 10 reps - Hip flexion in hooklying (BOTH KNEES BENT)  - 1 x daily - 7 x weekly - 2 sets - 10 reps - Single Leg Bridge  - 1 x daily - 7 x weekly - 1 sets - 10 reps - Supine Bridge with Leg Extension  - 1 x daily - 7 x weekly - 1 sets - 10 reps - Sidelying Hip Abduction  - 1 x daily - 7 x weekly - 2 sets - 10 reps - Clamshell  - 1 x daily - 7 x weekly - 2 sets - 10 reps - Seated Hamstring Curl with Anchored Resistance  - 1 x daily - 7 x weekly - 2 sets - 10 reps - Standing Gastroc Stretch  - 1 x daily - 7 x weekly - 1 sets - 1-2 reps - 20-30 sec hold  GOALS: Goals reviewed with patient? Yes  SHORT TERM GOALS: Target date: 02-12-24  Obtain order for Rt AFO from MD. Baseline:  Appt scheduled 11/25 (11/21); saw [facility] 11/25 - awaiting information  on insurance coverage (11/26) Goal status: MET  2.  Increase TUG score to </= 13.5 secs without device (without use of AFO unless obtained by this date). Baseline: 15.41 secs; 11.35 sec no AD SBA (11/26) Goal status: MET  3.  Increase gait velocity to >/= 2.60 ft/sec without device for increased gait efficiency.  Baseline: 14.94 secs = 2.20 ft/sec; 1.10 m/sec OR 3.63 ft/sec (11/26) Goal status: MET  4.  Independent in HEP for RLE strengthening. Baseline: IND (11/21) Goal status: MET  5.  Perform DGI with LTG to be set.  Baseline: 13/24 (10/28) Goal status: MET   LONG TERM GOALS: Target date: 03-11-24  Pt will independently don and doff AFO on RLE.  Baseline:  Goal status: INITIAL  2.   Increase TUG score to </= 12.0 secs without device (without use of AFO unless obtained by this date). Baseline:  15.41 secs; 11.35 sec no AD SBA (11/26) Goal status: MET  3.  Increase gait velocity to >/= 3.83 ft/sec without device for increased gait efficiency.  Baseline: 14.94 secs = 2.20 ft/sec; 1.10 m/sec OR 3.63 ft/sec (11/26) Goal status: REVISED  4.  Improve DGI score by at least 4 points.  Baseline: 13/24 (10/28) Goal status: INITIAL  5.  Negotiate 4 steps with 1 rail using step over step sequence modified independently. Baseline:  Goal status: INITIAL  ASSESSMENT:  CLINICAL IMPRESSION: Focus of skilled PT session today on progressing push/pull technique.  Progressed to unilateral demand for increased stability challenge to core and R shoulder.  He does very well with these tasks.  Noted some mobility deficits of the hips and RLE during attempts at lunge variation today.  He endorses R groin tightness so PT will work on this in future visit to improve mechanics and bilateral integration to upright movement patterns.  Continue per POC.  OBJECTIVE IMPAIRMENTS: Abnormal gait, decreased activity tolerance, decreased balance, decreased ROM, decreased strength, and impaired UE functional use.   ACTIVITY LIMITATIONS: carrying, lifting, stairs, and locomotion level  PARTICIPATION LIMITATIONS: meal prep, cleaning, driving, shopping, community activity, and yard work  PERSONAL FACTORS: Past/current experiences and 1-2 comorbidities: h/o previous Lt CVA are also affecting patient's functional outcome.   REHAB POTENTIAL: Good  CLINICAL DECISION MAKING: Evolving/moderate complexity  EVALUATION COMPLEXITY: Moderate  PLAN:  PT FREQUENCY: 1x/week  PT DURATION: 8 weeks + eval  PLANNED INTERVENTIONS: 97110-Therapeutic exercises, 97530- Therapeutic activity, [id number]- Neuromuscular re-education, 97535- Self Care, 02883- Gait training, [phone]- Orthotic Initial, [phone]- Orthotic/Prosthetic subsequent, and Patient/Family education  PLAN FOR NEXT SESSION:  Cont LLE strengthening and balance training;  continue functional lift.  Work on R groin tightness - pt endorses this when running ?  [facility] appt 12/23 - will insurance cover AFO?  Consider re-cert 1x/wk for 4 wks at last scheduled 12/19 appt vs D/C?  Daved KATHEE Bull, PT, DPT 02/26/2024, 11:01 AM

## 2024-03-02 ENCOUNTER — Ambulatory Visit: Admitting: Physical Therapy

## 2024-03-02 ENCOUNTER — Ambulatory Visit: Admitting: Occupational Therapy

## 2024-03-02 ENCOUNTER — Encounter: Payer: Self-pay | Admitting: Physical Therapy

## 2024-03-02 DIAGNOSIS — R2689 Other abnormalities of gait and mobility: Secondary | ICD-10-CM

## 2024-03-02 DIAGNOSIS — R278 Other lack of coordination: Secondary | ICD-10-CM

## 2024-03-02 DIAGNOSIS — I69351 Hemiplegia and hemiparesis following cerebral infarction affecting right dominant side: Secondary | ICD-10-CM

## 2024-03-02 DIAGNOSIS — M25511 Pain in right shoulder: Secondary | ICD-10-CM

## 2024-03-02 DIAGNOSIS — M6281 Muscle weakness (generalized): Secondary | ICD-10-CM

## 2024-03-02 DIAGNOSIS — R2681 Unsteadiness on feet: Secondary | ICD-10-CM

## 2024-03-02 DIAGNOSIS — R208 Other disturbances of skin sensation: Secondary | ICD-10-CM

## 2024-03-02 DIAGNOSIS — R29818 Other symptoms and signs involving the nervous system: Secondary | ICD-10-CM

## 2024-03-02 NOTE — Therapy (Signed)
 OUTPATIENT OCCUPATIONAL THERAPY NEURO TREATMENT  Patient Name: Arthur Howard MRN: [id number] DOB:1965/04/25, 58 y.o., male Today's Date: 03/02/2024  PCP: Amilbia, Jaden, DO  REFERRING PROVIDER: Francesco Elsie NOVAK, MD  END OF SESSION:  OT End of Session - 03/02/24 1104     Visit Number 8    Number of Visits 12    Date for Recertification  03/23/24    Authorization Type [employer] MCR - auth required    Authorization Time Period APPROVED 12 visits from 02/02/24 - 05/02/24    OT Start Time 1103    OT Stop Time 1145    OT Time Calculation (min) 42 min    Activity Tolerance Patient tolerated treatment well    Behavior During Therapy [facility] for tasks assessed/performed         Past Medical History:  Diagnosis Date   Burning with urination 08/06/2020   Diabetes mellitus without complication (HCC) 2015   Dizziness 04/16/2022   Hyperlipidemia    Increased urinary frequency 04/02/2022   Stroke (HCC) 05/06/2018   R sided weakness   Past Surgical History:  Procedure Laterality Date   ANKLE SURGERY     CLOSED REDUCTION METACARPAL WITH PERCUTANEOUS PINNING Right 07/26/2020   Procedure: CLOSED REDUCTION METACARPAL WITH PERCUTANEOUS PINNING;  Surgeon: Murrell Drivers, MD;  Location: [facility];  Service: Orthopedics;  Laterality: Right;   Patient Active Problem List   Diagnosis Date Noted   Recent cerebrovascular accident (CVA) 12/29/2023   Elevated BP reading w/ no diagnosis of HTN 12/29/2023   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 type 2 diabetes mellitus (HCC) 05/09/2021   Spasticity as late effect of cerebrovascular accident (CVA) 04/25/2021   Healthcare maintenance 04/25/2021   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 (HCC) 08/02/2018   History of CVA with residual deficit 05/11/2018   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 without macular edema,  without long-term current use of insulin  (HCC) 08/12/2014   ONSET DATE: 01/14/2024 (referral date)   REFERRING DIAG: I63.9 (ICD-10-CM) - Acute cerebral infarction (HCC) Z86.73 (ICD-10-CM) - Recent cerebrovascular accident (CVA)  THERAPY DIAG:  Hemiplegia and hemiparesis following cerebral infarction affecting right dominant side (HCC)  Right shoulder pain, unspecified chronicity  Other lack of coordination  Muscle weakness (generalized)  Rationale for Evaluation and Treatment: Rehabilitation  SUBJECTIVE:   SUBJECTIVE STATEMENT:  I only have some pain in my Rt shoulder when turning in bed (pt has sleep positioning handout and reports this is going better). Pt also has some pain with shoulder compensations during movement  Pt accompanied by: Daughter (interprets for him)   PERTINENT HISTORY: presented to ED on 12/29/23 with R UE and LE weakness MRI(+) lacunar infarct L corona radiata and posterior L lentiform plus chronic R & L lacunar infarcts  PMH L pontine CVA 2020 with R hemiplegia, HTN DM on insulin  HLD, moderate nonproliferative retinopathy  PRECAUTIONS: Other: no driving  WEIGHT BEARING RESTRICTIONS: No  PAIN:  Are you having pain? No  FALLS: Has patient fallen in last 6 months? No  LIVING ENVIRONMENT: Lives with: Lives w/ daughter Lives in: 1 story house w/ no steps to enter Has following equipment at home: None  PLOF: Independent and on disability from first stroke  PATIENT GOALS: work on my arm, writing  OBJECTIVE:  Note: Objective measures were completed at Evaluation unless otherwise noted.  HAND DOMINANCE: Right  ADLs: Overall ADLs: mod I  Transfers/ambulation  related to ADLs: independent Eating: mod I  Grooming: mod I  UB Dressing: mod I  LB Dressing: mod I  Toileting: mod I  Bathing: mod I  Tub Shower transfers: mod I  Equipment: none  IADLs: Shopping: daughter does Light housekeeping: pt sweeps, takes out trash Meal Prep: daughter does all  the cooking Community mobility: pt does not drive Medication management: independent  Financial management: pt and daughter does together Handwriting: 100% legible and in print (name only)   MOBILITY STATUS: Independent  UPPER EXTREMITY ROM:  RUE 75% at shoulder w/ pain up to 7/10 along middle deltoid, Rt elbow distally WFL's (90% or more ROM).  LUE AROM WNL's  UPPER EXTREMITY MMT:   Not tested  HAND FUNCTION: Grip strength: Right: 52.9 lbs; Left: 70.1 lbs  COORDINATION: 9 Hole Peg test: Right: 42.97 sec; Left: 25.45 sec  SENSATION: WFL  EDEMA: none  MUSCLE TONE: RUE: Mild and Hypertonic  COGNITION: Overall cognitive status: reports decreased memory  VISION: Subjective report: sometimes he sees blurry after cataract sx, but no changes from stroke Baseline vision: none Visual history: corrective eye surgery for cataracts  VISION ASSESSMENT: To be further assessed in functional context  PERCEPTION: Not tested  PRAXIS: Not tested  OBSERVATIONS: Pt with Rt dominant side hemiparesis exacerbated from recent CVA, spanish speaking (family interprets for him), weakness noted Rt scapula                                                                                                                           TODAY'S TREATMENT:   K-taped Rt upper traps and middle deltoid to relax muscles as pt compensates with sh elevation and sh abduction, and K-taped from spine to anterior shoulder to activate scapula retraction and depression and trunk extension; and to give proprioceptive input as pt has decreased proprioception, kinesthesia and poor body awareness. Pt/daughter instructed in wear and care and told to remove tape if tape is aggravating skin, but otherwise to leave on for 4-5 days  Seated on physioball to work on pelvic and trunk disassociation, trunk flexibility and core control - pt asked to perform A/P pelvic tilts, side to side lateral trunk flexion/hip hike, and circumduction  w/ max tactile cues to perform correctly. Pt with little to no disassociation b/t trunk and pelvis and requires cues to prevent compensations at head and neck musculature (tensing)  UBE x 5 min, level 1 for normal reciprocal movement pattern and UB conditioning w/ cues to prevent shoulder abduction  PATIENT EDUCATION: Education details: see above Person educated: Patient and daughter Education method: Explanation, Demonstration, Tactile cues, Verbal cues, and Handouts Education comprehension: verbalized understanding, returned demonstration, verbal cues required, and tactile cues required  HOME EXERCISE PROGRAM: 02/02/24: initial shoulder flexion ex in supine 02/03/24: scapula HEP, coordination activities (written down by daughter in Spanish) 02/16/2024: coordination with pictures 02/17/24: Putty Activities: Access Code: [email or web address] 02/24/24: sleep positioning handout for hemiplegia   GOALS: Goals reviewed with patient? Yes  SHORT TERM GOALS: Target date: 02/26/24  Independent with HEP for RUE (Shoulder, coordination, grip)  Baseline: Goal status: MET  2.  Pt to verbalize understanding with pain reduction strategies for RUE  Baseline:  Goal status: MET  3.  Pt to perform light IADLS (folding clothes, washing dishes, etc)  Baseline:  Goal status: MET  4.  Pt to improve writing efficiency by writing paragraph with 90% or greater legibility in reasonable amount of time with A/E prn Baseline:  Goal status: MET   LONG TERM GOALS: Target date: 03/18/24  Pt to perform higher level reaching RUE w/ min compensations and pain less than 5/10 to retrieve light weight objects from shelf Baseline:  Goal status: INITIAL  2.  Grip strength Rt hand to consistently be 55 lbs or greater Baseline: 52.9 lbs 02/26/2024: 59 lbs Goal status: MET  3.  Improve coordination Rt hand as evidenced by performing 9 hole peg test in 38 sec or less Baseline: 42.97 sec 02/26/2024: 32 sec Goal status:  MET  4.  Pt to perform light meal prep with supervision safely Baseline:  Goal status: INITIAL  5.  Pt to verbalize understanding with memory compensatory strategies Baseline:  Goal status: MET  ASSESSMENT:  CLINICAL IMPRESSION: Patient seen today for occupational therapy treatment for s/p acute CVA 12/29/23 with increased Rt sided weakness. Pt has met all STGs and is making good progress towards LTGs. Pt with poor proprioception/body awareness, and difficulty disassociating trunk and pelvis. Recommend more consistent use of heating pad for management of RUE pain and stiffness and to promote more functional use.   PERFORMANCE DEFICITS: in functional skills including ADLs, IADLs, coordination, dexterity, ROM, strength, pain, Fine motor control, Gross motor control, mobility, decreased knowledge of use of DME, vision, and UE functional use, cognitive skills including memory,   IMPAIRMENTS: are limiting patient from ADLs, IADLs, leisure, and social participation.   CO-MORBIDITIES: may have co-morbidities  that affects occupational performance. Patient will benefit from skilled OT to address above impairments and improve overall function.  REHAB POTENTIAL: Good  PLAN:  OT FREQUENCY: 1-2x/week  OT DURATION: 6 weeks  PLANNED INTERVENTIONS: 97535 self care/ADL training, 02889 therapeutic exercise, 97530 therapeutic activity, 97112 neuromuscular re-education, 97140 manual therapy, 97113 aquatic therapy, 97018 paraffin, 02960 fluidotherapy, 97010 moist heat, 97760 Orthotic Initial, 97763 Orthotic/Prosthetic subsequent, passive range of motion, functional mobility training, coping strategies training, patient/family education, and DME and/or AE instructions  RECOMMENDED OTHER SERVICES: none at this time  CONSULTED AND AGREED WITH PLAN OF CARE: Patient  PLAN FOR NEXT SESSION: reaching activities, scapula stabilization (try closed chain mid to high level body on arm movements)  UBE ? How  sleep is going, ? Adding visits into January and renewal end of year    Burnard JINNY Roads, OT 03/02/2024, 11:05 AM

## 2024-03-02 NOTE — Therapy (Signed)
 OUTPATIENT PHYSICAL THERAPY NEURO TREATMENT   Patient Name: Arthur Howard MRN: [id number] DOB:February 15, 1966, 58 y.o., male Today's Date: 03/02/2024   PCP: Amilibia, Jaden, DO REFERRING PROVIDER: Eben Reyes BROCKS, MD  END OF SESSION:  PT End of Session - 03/02/24 1016     Visit Number 8    Number of Visits 9    Date for Recertification  03/18/24    Authorization Type [employer]    Authorization Time Period 01-11-24 - 03-23-24    PT Start Time 1015    PT Stop Time 1057    PT Time Calculation (min) 42 min    Equipment Utilized During Treatment Gait belt    Activity Tolerance Patient tolerated treatment well    Behavior During Therapy [facility] for tasks assessed/performed           Past Medical History:  Diagnosis Date   Burning with urination 08/06/2020   Diabetes mellitus without complication (HCC) 2015   Dizziness 04/16/2022   Hyperlipidemia    Increased urinary frequency 04/02/2022   Stroke (HCC) 05/06/2018   R sided weakness   Past Surgical History:  Procedure Laterality Date   ANKLE SURGERY     CLOSED REDUCTION METACARPAL WITH PERCUTANEOUS PINNING Right 07/26/2020   Procedure: CLOSED REDUCTION METACARPAL WITH PERCUTANEOUS PINNING;  Surgeon: Murrell Drivers, MD;  Location: [facility];  Service: Orthopedics;  Laterality: Right;   Patient Active Problem List   Diagnosis Date Noted   Recent cerebrovascular accident (CVA) 12/29/2023   Elevated BP reading w/ no diagnosis of HTN 12/29/2023   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 type 2 diabetes mellitus (HCC) 05/09/2021   Spasticity as late effect of cerebrovascular accident (CVA) 04/25/2021   Healthcare maintenance 04/25/2021   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 (HCC) 08/02/2018   History of CVA with residual deficit 05/11/2018   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  without macular edema, without long-term current use of insulin  (HCC) 08/12/2014    ONSET DATE: 12-28-23  REFERRING DIAG: R53.1 (ICD-10-CM) - Right sided weakness I69.351 (ICD-10-CM) - Spastic hemiplegia of right dominant side as late effect of cerebral infarction (HCC)  THERAPY DIAG:  Other lack of coordination  Muscle weakness (generalized)  Hemiplegia and hemiparesis following cerebral infarction affecting right dominant side (HCC)  Other symptoms and signs involving the nervous system  Other disturbances of skin sensation  Unsteadiness on feet  Other abnormalities of gait and mobility  Rationale for Evaluation and Treatment: Rehabilitation  SUBJECTIVE:  SUBJECTIVE STATEMENT: Pt accompanied by daughter to PT, who interprets for patient per preference; He denies falls or acute status changes.  No pain today.  His prior dizziness continues to present intermittently in the mornings and late afternoons.  His right eye has some blood pooling in the sclera, but he denies visual changes reporting this has happened before without issue.  Pt accompanied by: family member - daughter   PERTINENT HISTORY: h/o Lt pontine CVA in Feb. 2020 (received PT and OT at this facility), type 2 DM   Per chart note [name] is a 58 y.o. with history of CVA with right-sided weakness as late effect who presents with right sided weakness and slurred speech and was admitted for workup of an acute lacunar infarct.     Acute cerebral infarction  Patient presented to the ED on 10/7 with right sided arm and leg weakness with slurred speech since 10/5. Has a history of small vessel disease and stroke in 2020 with sustained right sided weakness and word finding difficulties. MRI of the head demonstrated an acute lacunar  infarct of the left corona radiata and posterior left lentiform with choric left pontine lacunar infarct and chronic lacunar infarcts of the right basal ganglia and frontal lobe. Patient was not in treatment window for TNK or LVO.   PAIN:  Are you having pain? Yes: NPRS scale: 0 Pain location: low back Pain description: ache, stabbing  PRECAUTIONS: Fall  RED FLAGS: None   WEIGHT BEARING RESTRICTIONS: No  FALLS: Has patient fallen in last 6 months? No  LIVING ENVIRONMENT: Lives with: lives with their family Lives in: House/apartment Stairs: No Has following equipment at home: [facility]  PLOF: Independent with basic ADLs, Independent with household mobility without device, Independent with community mobility without device, and Independent with transfers  PATIENT GOALS: increase strength in Rt arm and leg   OBJECTIVE:  Note: Objective measures were completed at Evaluation unless otherwise noted.  DIAGNOSTIC FINDINGS: IMPRESSION:MRI 12-29-23 1. Acute lacunar infarct left corona radiata and posterior left lentiform, no hemorrhage or mass effect. 2. Chronic left pontine lacunar infarct with expected evolution and associated Wallerian degeneration. 3. Chronic lacunar infarcts of the right basal ganglia, chronic frontal lobe white matter changes.    COGNITION: Overall cognitive status: Within functional limits for tasks assessed   SENSATION: Not tested  COORDINATION: Decreased RLE due to decr. strength  POSTURE: No Significant postural limitations  LOWER EXTREMITY ROM:   WNL's except Rt ankle - decreased AROM due to weakness   LOWER EXTREMITY MMT:    MMT Right Eval Left Eval  Hip flexion 4 WNL's  Hip extension    Hip abduction    Hip adduction    Hip internal rotation    Hip external rotation    Knee flexion 4   Knee extension 5   Ankle dorsiflexion 2+ - 3-   Ankle plantarflexion 3-   Ankle inversion    Ankle eversion    (Blank rows = not tested)  BED  MOBILITY:  Not tested  TRANSFERS: Sit to stand: Modified independence  Assistive device utilized: None     Stand to sit: Complete Independence  Assistive device utilized: None      STAIRS: Not tested GAIT: Findings: Gait Characteristics: step through pattern and decreased ankle dorsiflexion- Right, Distance walked: 75', Assistive device utilized:None, Level of assistance: Modified independence, and Comments: increased Rt foot supination in swing/stance  FUNCTIONAL TESTS:  Timed up and go (TUG): 15.41 secs without device  without use of AFO on RLE:  14.19 secs with AFO on RLE - no device 10 meter walk test: 14.94 secs without AFO = 2.20 ft/sec;  11.66 secs with AFO on RLE = 2.81 ft/sec with AFO on RLE                                                                                                                              TREATMENT DATE: 03/02/24 -SciFit dual peaks mode up to level 5.0 using BUE/BLE support over 10 minutes for global strength and endurance as well as large amplitude reciprocal mobility.  Average stride 11.2 inches. -Bilateral leg press 3x8 at 100lbs, cues for form and pacing -Supine butterfly stretch 2x1 minute -Supine LTR 2x20 -Supine 90/90 IR/ER 2x20 -Supine figure-4 stretch 2x45 seconds each LE -Strap assisted SLR into abduction for adductor and medial hamstring stretch 2x45 sec each LE  PATIENT EDUCATION: Education details: Continue HEP.  Monitor right eye and if blood does not resolve or worsens over the next week or so please consult your PCP or go straight to the ED depending upon symptoms.  Encouraged BP monitoring at home. Person educated: Patient and Child(ren) Education method: Explanation, Demonstration, and Handouts Education comprehension: verbalized understanding and returned demonstration  HOME EXERCISE PROGRAM: Access Code: [email or web address] URL: [email or web address] Date: 01/13/2024 Prepared by: Rock Kussmaul  Exercises - Standing  Bilateral Gastroc Stretch with Step  - 3 x daily - 7 x weekly - 1 sets - 1-2 reps - 20-30 sec hold - Single Leg Heel Raise with Counter Support  - 2-3 x daily - 7 x weekly - 1 sets - 10 reps - Seated Eccentric Ankle Plantar Flexion with Resistance - Straight Leg  - 2-3 x daily - 7 x weekly - 1 sets - 10 reps - Seated Ankle Dorsiflexion with Resistance  - 1-2 x daily - 7 x weekly - 3 sets - 10 reps - 2 sec hold  Updated HEP:  Access Code: [email or web address] Date: 01/27/2024 Prepared by: Rock Kussmaul  Exercises - Supine Active Straight Leg Raise  - 1 x daily - 7 x weekly - 2 sets - 10 reps - Hip flexion in hooklying (BOTH KNEES BENT)  - 1 x daily - 7 x weekly - 2 sets - 10 reps - Single Leg Bridge  - 1 x daily - 7 x weekly - 1 sets - 10 reps - Supine Bridge with Leg Extension  - 1 x daily - 7 x weekly - 1 sets - 10 reps - Sidelying Hip Abduction  - 1 x daily - 7 x weekly - 2 sets - 10 reps - Clamshell  - 1 x daily - 7 x weekly - 2 sets - 10 reps - Seated Hamstring Curl with Anchored Resistance  - 1 x daily - 7 x weekly - 2 sets - 10 reps - Standing Gastroc Stretch  - 1 x daily - 7 x weekly - 1 sets -  1-2 reps - 20-30 sec hold  GOALS: Goals reviewed with patient? Yes  SHORT TERM GOALS: Target date: 02-12-24  Obtain order for Rt AFO from MD. Baseline:  Appt scheduled 11/25 (11/21); saw [facility] 11/25 - awaiting information on insurance coverage (11/26) Goal status: MET  2.  Increase TUG score to </= 13.5 secs without device (without use of AFO unless obtained by this date). Baseline: 15.41 secs; 11.35 sec no AD SBA (11/26) Goal status: MET  3.  Increase gait velocity to >/= 2.60 ft/sec without device for increased gait efficiency.  Baseline: 14.94 secs = 2.20 ft/sec; 1.10 m/sec OR 3.63 ft/sec (11/26) Goal status: MET  4.  Independent in HEP for RLE strengthening. Baseline: IND (11/21) Goal status: MET  5.  Perform DGI with LTG to be set.  Baseline: 13/24  (10/28) Goal status: MET   LONG TERM GOALS: Target date: 03-11-24  Pt will independently don and doff AFO on RLE.  Baseline:  Goal status: INITIAL  2.   Increase TUG score to </= 12.0 secs without device (without use of AFO unless obtained by this date). Baseline: 15.41 secs; 11.35 sec no AD SBA (11/26) Goal status: MET  3.  Increase gait velocity to >/= 3.83 ft/sec without device for increased gait efficiency.  Baseline: 14.94 secs = 2.20 ft/sec; 1.10 m/sec OR 3.63 ft/sec (11/26) Goal status: REVISED  4.  Improve DGI score by at least 4 points.  Baseline: 13/24 (10/28) Goal status: INITIAL  5.  Negotiate 4 steps with 1 rail using step over step sequence modified independently. Baseline:  Goal status: INITIAL  ASSESSMENT:  CLINICAL IMPRESSION: Emphasis of skilled session today on stretching adductors and medial hip structures bilaterally as pt has previously endorsed tightness here.  He does have some initial thigh discomfort with IR/ER focused stretching that does resolve with repetition as his ROM improves.  His gait mechanics are progressing well and he is ambulatory without a cane.  He is due for LTG assessment at next visit with PT considering re-certification to address lingering RLE deficits as needed vs D/C.  Will proceed with determining POC with pt and family input at next visit.  OBJECTIVE IMPAIRMENTS: Abnormal gait, decreased activity tolerance, decreased balance, decreased ROM, decreased strength, and impaired UE functional use.   ACTIVITY LIMITATIONS: carrying, lifting, stairs, and locomotion level  PARTICIPATION LIMITATIONS: meal prep, cleaning, driving, shopping, community activity, and yard work  PERSONAL FACTORS: Past/current experiences and 1-2 comorbidities: h/o previous Lt CVA are also affecting patient's functional outcome.   REHAB POTENTIAL: Good  CLINICAL DECISION MAKING: Evolving/moderate complexity  EVALUATION COMPLEXITY: Moderate  PLAN:  PT  FREQUENCY: 1x/week  PT DURATION: 8 weeks + eval  PLANNED INTERVENTIONS: 97110-Therapeutic exercises, 97530- Therapeutic activity, [id number]- Neuromuscular re-education, 97535- Self Care, 02883- Gait training, [phone]- Orthotic Initial, [phone]- Orthotic/Prosthetic subsequent, and Patient/Family education  PLAN FOR NEXT SESSION:  Cont LLE strengthening and balance training; continue functional lift.  Work on R groin tightness - pt endorses this when running ?  Continue leg press, SciFit, try boxing bag?  Ball kicks on treadmill, obstacles?  [facility] appt 12/23 - will insurance cover AFO?  ASSESS LTGs - Consider re-cert 1x/wk for 4 wks at last scheduled 12/19 appt vs D/C?  Daved KATHEE Bull, PT, DPT 03/02/2024, 10:58 AM

## 2024-03-04 ENCOUNTER — Ambulatory Visit: Admitting: Occupational Therapy

## 2024-03-04 DIAGNOSIS — I69351 Hemiplegia and hemiparesis following cerebral infarction affecting right dominant side: Secondary | ICD-10-CM

## 2024-03-04 DIAGNOSIS — R29818 Other symptoms and signs involving the nervous system: Secondary | ICD-10-CM

## 2024-03-04 DIAGNOSIS — M25511 Pain in right shoulder: Secondary | ICD-10-CM

## 2024-03-04 DIAGNOSIS — M6281 Muscle weakness (generalized): Secondary | ICD-10-CM

## 2024-03-04 DIAGNOSIS — R278 Other lack of coordination: Secondary | ICD-10-CM | POA: Diagnosis not present

## 2024-03-04 DIAGNOSIS — R208 Other disturbances of skin sensation: Secondary | ICD-10-CM

## 2024-03-04 NOTE — Therapy (Signed)
 OUTPATIENT OCCUPATIONAL THERAPY NEURO TREATMENT  Patient Name: Arthur Howard MRN: [id number] DOB:1965-07-02, 58 y.o., male Today's Date: 03/04/2024  PCP: Amilbia, Jaden, DO  REFERRING PROVIDER: Francesco Elsie NOVAK, MD  END OF SESSION:  OT End of Session - 03/04/24 1018     Visit Number 9    Number of Visits 12    Date for Recertification  03/18/24   Updated per signed POC   Authorization Type [employer] MCR - auth required    Authorization Time Period APPROVED 12 visits from 02/02/24 - 05/02/24    OT Start Time 1018    OT Stop Time 1100    OT Time Calculation (min) 42 min    Activity Tolerance Patient tolerated treatment well    Behavior During Therapy [facility] for tasks assessed/performed         Past Medical History:  Diagnosis Date   Burning with urination 08/06/2020   Diabetes mellitus without complication (HCC) 2015   Dizziness 04/16/2022   Hyperlipidemia    Increased urinary frequency 04/02/2022   Stroke (HCC) 05/06/2018   R sided weakness   Past Surgical History:  Procedure Laterality Date   ANKLE SURGERY     CLOSED REDUCTION METACARPAL WITH PERCUTANEOUS PINNING Right 07/26/2020   Procedure: CLOSED REDUCTION METACARPAL WITH PERCUTANEOUS PINNING;  Surgeon: Murrell Drivers, MD;  Location: [facility];  Service: Orthopedics;  Laterality: Right;   Patient Active Problem List   Diagnosis Date Noted   Recent cerebrovascular accident (CVA) 12/29/2023   Elevated BP reading w/ no diagnosis of HTN 12/29/2023   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 type 2 diabetes mellitus (HCC) 05/09/2021   Spasticity as late effect of cerebrovascular accident (CVA) 04/25/2021   Healthcare maintenance 04/25/2021   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 (HCC) 08/02/2018   History of CVA with residual deficit 05/11/2018   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  without macular edema, without long-term current use of insulin  (HCC) 08/12/2014   ONSET DATE: 01/14/2024 (referral date)   REFERRING DIAG: I63.9 (ICD-10-CM) - Acute cerebral infarction (HCC) Z86.73 (ICD-10-CM) - Recent cerebrovascular accident (CVA)  THERAPY DIAG:  Hemiplegia and hemiparesis following cerebral infarction affecting right dominant side (HCC)  Right shoulder pain, unspecified chronicity  Other lack of coordination  Muscle weakness (generalized)  Other symptoms and signs involving the nervous system  Other disturbances of skin sensation  Rationale for Evaluation and Treatment: Rehabilitation  SUBJECTIVE:   SUBJECTIVE STATEMENT:  Pt reports he believes his last OT visit is next Friday.   Taping over R shoulder seems to be helpful.   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 No  FALLS: Has patient fallen in last 6 months? No  LIVING ENVIRONMENT: Lives with: Lives w/ daughter Lives in: 1 story house w/ no steps to enter Has following equipment at home: None  PLOF: Independent and on disability from first stroke  PATIENT GOALS: work on my arm, writing  OBJECTIVE:  Note: Objective measures were completed at Evaluation unless otherwise noted.  HAND DOMINANCE: Right  ADLs: Overall ADLs: mod I  Transfers/ambulation related to ADLs: independent Eating: mod I  Grooming: mod I  UB Dressing: mod I  LB Dressing: mod I  Toileting: mod I  Bathing: mod I  Tub Shower transfers: mod I  Equipment: none  IADLs: Shopping: daughter does Light housekeeping: pt sweeps, takes out  trash Meal Prep: daughter does all the cooking Community mobility: pt does not drive Medication management: independent  Financial management: pt and daughter does together Handwriting: 100% legible and in print (name only)   MOBILITY STATUS: Independent  UPPER EXTREMITY ROM:  RUE 75% at shoulder w/ pain up to 7/10 along middle deltoid, Rt elbow distally WFL's (90% or more ROM).  LUE AROM WNL's  UPPER EXTREMITY MMT:   Not tested  HAND FUNCTION: Grip strength: Right: 52.9 lbs; Left: 70.1 lbs  COORDINATION: 9 Hole Peg test: Right: 42.97 sec; Left: 25.45 sec  SENSATION: WFL  EDEMA: none  MUSCLE TONE: RUE: Mild and Hypertonic  COGNITION: Overall cognitive status: reports decreased memory  VISION: Subjective report: sometimes he sees blurry after cataract sx, but no changes from stroke Baseline vision: none Visual history: corrective eye surgery for cataracts  VISION ASSESSMENT: To be further assessed in functional context  PERCEPTION: Not tested  PRAXIS: Not tested  OBSERVATIONS: Pt with Rt dominant side hemiparesis exacerbated from recent CVA, spanish speaking (family interprets for him), weakness noted Rt scapula                                                                                                                           TODAY'S TREATMENT:   - Self-care/home management completed for duration as noted below including: OT provided education with respect to POC including extension of services pending auth renewal vs taking a break from therapy for 3 months and returning depending on response from insurance.  - Neuro re-education completed for duration as noted below including: Pt completed 3 sets of 5 with 5 second hold shoulder retractions for improved shoulder mobility with therapist providing AAROM to facilitate R scapular glide across thoracic cavity.  Pt attempted wall angels to reduce R scapular winging but he was unable to complete needed retraction and  external rotation to position extremity against wall. Cues to bring feet up against wall to encourage improved postural positioning. Pt completed R sleeper stretches in R sidelying to promote external rotation and weightbearing through the affected side.  Pt positioned in supine with rolled up towel along spine for chest opener stretch to complete R pec stretch and facilitate shoulder retraction cues for head positioning as pt favors L lateral flexion.   PATIENT EDUCATION: Education details: see above Person educated: Patient and daughter Education method: Explanation, Demonstration, Tactile cues, and Verbal cues Education comprehension: verbalized understanding, returned demonstration, verbal cues required, and tactile cues required  HOME EXERCISE PROGRAM: 02/02/24: initial shoulder flexion ex in supine 02/03/24: scapula HEP, coordination activities (written down by daughter in Spanish) 02/16/2024: coordination with pictures 02/17/24:  Putty Activities: Access Code: [email or web address] 02/24/24: sleep positioning handout for hemiplegia   GOALS: Goals reviewed with patient? Yes  SHORT TERM GOALS: Target date: 02/26/24  Independent with HEP for RUE (Shoulder, coordination, grip)  Baseline: Goal status: MET  2.  Pt to verbalize understanding with pain reduction strategies for RUE  Baseline:  Goal status: MET  3.  Pt to perform light IADLS (folding clothes, washing dishes, etc)  Baseline:  Goal status: MET  4.  Pt to improve writing efficiency by writing paragraph with 90% or greater legibility in reasonable amount of time with A/E prn Baseline:  Goal status: MET   LONG TERM GOALS: Target date: 03/18/24  Pt to perform higher level reaching RUE w/ min compensations and pain less than 5/10 to retrieve light weight objects from shelf Baseline:  Goal status: INITIAL  2.  Grip strength Rt hand to consistently be 55 lbs or greater Baseline: 52.9 lbs 02/26/2024: 59 lbs Goal status: MET  3.   Improve coordination Rt hand as evidenced by performing 9 hole peg test in 38 sec or less Baseline: 42.97 sec 02/26/2024: 32 sec Goal status: MET  4.  Pt to perform light meal prep with supervision safely Baseline:  Goal status: INITIAL  5.  Pt to verbalize understanding with memory compensatory strategies Baseline:  Goal status: MET  ASSESSMENT:  CLINICAL IMPRESSION: Patient seen today for occupational therapy treatment for s/p acute CVA 12/29/23 with increased Rt sided weakness. He demonstrates impaired proprioception requiring cues for postural correction. Limited R shoulder retraction and strength resulting in impaired scapular gliding across thoracic cavity with subsequent scapular winging. Recommend further proximal stability to promote distal mobility and overall functional use of RUE.    PERFORMANCE DEFICITS: in functional skills including ADLs, IADLs, coordination, dexterity, ROM, strength, pain, Fine motor control, Gross motor control, mobility, decreased knowledge of use of DME, vision, and UE functional use, cognitive skills including memory,   IMPAIRMENTS: are limiting patient from ADLs, IADLs, leisure, and social participation.   CO-MORBIDITIES: may have co-morbidities  that affects occupational performance. Patient will benefit from skilled OT to address above impairments and improve overall function.  REHAB POTENTIAL: Good  PLAN:  OT FREQUENCY: 1-2x/week  OT DURATION: 6 weeks  PLANNED INTERVENTIONS: 97535 self care/ADL training, 02889 therapeutic exercise, 97530 therapeutic activity, 97112 neuromuscular re-education, 97140 manual therapy, 97113 aquatic therapy, 97018 paraffin, 02960 fluidotherapy, 97010 moist heat, 97760 Orthotic Initial, 97763 Orthotic/Prosthetic subsequent, passive range of motion, functional mobility training, coping strategies training, patient/family education, and DME and/or AE instructions  RECOMMENDED OTHER SERVICES: none at this  time  CONSULTED AND AGREED WITH PLAN OF CARE: Patient  PLAN FOR NEXT SESSION: reaching activities, scapula stabilization (try closed chain mid to high level body on arm movements)  UBE ? How sleep is going, ?    - request additional auth and renewal vs d/c with 3 month pause (sees [facility] in March)    Jocelyn CHRISTELLA Bottom, OT 03/04/2024, 3:47 PM

## 2024-03-09 ENCOUNTER — Ambulatory Visit: Admitting: Occupational Therapy

## 2024-03-09 DIAGNOSIS — I69351 Hemiplegia and hemiparesis following cerebral infarction affecting right dominant side: Secondary | ICD-10-CM

## 2024-03-09 DIAGNOSIS — R278 Other lack of coordination: Secondary | ICD-10-CM | POA: Diagnosis not present

## 2024-03-09 DIAGNOSIS — R29818 Other symptoms and signs involving the nervous system: Secondary | ICD-10-CM

## 2024-03-09 DIAGNOSIS — M25511 Pain in right shoulder: Secondary | ICD-10-CM

## 2024-03-09 DIAGNOSIS — M6281 Muscle weakness (generalized): Secondary | ICD-10-CM

## 2024-03-09 NOTE — Therapy (Signed)
 OUTPATIENT OCCUPATIONAL THERAPY NEURO TREATMENT  Patient Name: Arthur Howard MRN: [id number] DOB:Jan 25, 1966, 58 y.o., male Today's Date: 03/09/2024  PCP: Amilbia, Jaden, DO  REFERRING PROVIDER: Francesco Elsie NOVAK, MD  END OF SESSION:  OT End of Session - 03/09/24 0942     Visit Number 10    Number of Visits 12    Date for Recertification  03/18/24   Updated per signed POC   Authorization Type [employer] MCR - auth required    Authorization Time Period APPROVED 12 visits from 02/02/24 - 05/02/24    OT Start Time 0935    OT Stop Time 1015    OT Time Calculation (min) 40 min    Activity Tolerance Patient tolerated treatment well    Behavior During Therapy [facility] for tasks assessed/performed         Past Medical History:  Diagnosis Date   Burning with urination 08/06/2020   Diabetes mellitus without complication (HCC) 2015   Dizziness 04/16/2022   Hyperlipidemia    Increased urinary frequency 04/02/2022   Stroke (HCC) 05/06/2018   R sided weakness   Past Surgical History:  Procedure Laterality Date   ANKLE SURGERY     CLOSED REDUCTION METACARPAL WITH PERCUTANEOUS PINNING Right 07/26/2020   Procedure: CLOSED REDUCTION METACARPAL WITH PERCUTANEOUS PINNING;  Surgeon: Murrell Drivers, MD;  Location: [facility];  Service: Orthopedics;  Laterality: Right;   Patient Active Problem List   Diagnosis Date Noted   Recent cerebrovascular accident (CVA) 12/29/2023   Elevated BP reading w/ no diagnosis of HTN 12/29/2023   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 type 2 diabetes mellitus (HCC) 05/09/2021   Spasticity as late effect of cerebrovascular accident (CVA) 04/25/2021   Healthcare maintenance 04/25/2021   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 (HCC) 08/02/2018   History of CVA with residual deficit 05/11/2018   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  without macular edema, without long-term current use of insulin  (HCC) 08/12/2014   ONSET DATE: 01/14/2024 (referral date)   REFERRING DIAG: I63.9 (ICD-10-CM) - Acute cerebral infarction (HCC) Z86.73 (ICD-10-CM) - Recent cerebrovascular accident (CVA)  THERAPY DIAG:  Hemiplegia and hemiparesis following cerebral infarction affecting right dominant side (HCC)  Right shoulder pain, unspecified chronicity  Other lack of coordination  Muscle weakness (generalized)  Other symptoms and signs involving the nervous system  Rationale for Evaluation and Treatment: Rehabilitation  SUBJECTIVE:   SUBJECTIVE STATEMENT:  No pain. Pt wishes to d/c for now (after appointment this Monday on 03/14/24). Pt understands he will need new referral to return. Offered to renew however pt politely wanted to d/c after next Monday  Pt accompanied by: Daughter (interprets for him)   PERTINENT HISTORY: presented to ED on 12/29/23 with R UE and LE weakness MRI(+) lacunar infarct L corona radiata and posterior L lentiform plus chronic R & L lacunar infarcts  PMH L pontine CVA 2020 with R hemiplegia, HTN DM on insulin  HLD, moderate nonproliferative retinopathy  PRECAUTIONS: Other: no driving  WEIGHT BEARING RESTRICTIONS: No  PAIN:  Are you having pain? No  FALLS: Has patient fallen in last 6 months? No  LIVING ENVIRONMENT: Lives with: Lives w/ daughter Lives in: 1 story house w/ no steps to enter Has following equipment at home: None  PLOF: Independent and on disability from first stroke  PATIENT GOALS: work on my arm, writing  OBJECTIVE:  Note: Objective measures were completed at Evaluation  unless otherwise noted.  HAND DOMINANCE: Right  ADLs: Overall ADLs: mod I  Transfers/ambulation related to ADLs: independent Eating: mod I  Grooming: mod I  UB Dressing: mod I  LB Dressing: mod I  Toileting: mod I  Bathing: mod I  Tub Shower transfers: mod I  Equipment: none  IADLs: Shopping:  daughter does Light housekeeping: pt sweeps, takes out trash Meal Prep: daughter does all the cooking Community mobility: pt does not drive Medication management: independent  Financial management: pt and daughter does together Handwriting: 100% legible and in print (name only)   MOBILITY STATUS: Independent  UPPER EXTREMITY ROM:  RUE 75% at shoulder w/ pain up to 7/10 along middle deltoid, Rt elbow distally WFL's (90% or more ROM).  LUE AROM WNL's  UPPER EXTREMITY MMT:   Not tested  HAND FUNCTION: Grip strength: Right: 52.9 lbs; Left: 70.1 lbs  COORDINATION: 9 Hole Peg test: Right: 42.97 sec; Left: 25.45 sec  SENSATION: WFL  EDEMA: none  MUSCLE TONE: RUE: Mild and Hypertonic  COGNITION: Overall cognitive status: reports decreased memory  VISION: Subjective report: sometimes he sees blurry after cataract sx, but no changes from stroke Baseline vision: none Visual history: corrective eye surgery for cataracts  VISION ASSESSMENT: To be further assessed in functional context  PERCEPTION: Not tested  PRAXIS: Not tested  OBSERVATIONS: Pt with Rt dominant side hemiparesis exacerbated from recent CVA, spanish speaking (family interprets for him), weakness noted Rt scapula                                                                                                                           TODAY'S TREATMENT:   Seated at mat: worked on closed chain wt bearing through BUE's w/ body on arm movements including: bilateral lateral trunk flexion, bilateral trunk elongation, bilateral trunk rotation, and pelvic lifts/bridges off mat while wt bearing through BUE's in sh extension and ER Standing at wall: worked on AA/ROM in BUE sh flexion sliding ball along wall with mod assist and facilitation to RUE and scapula.  Wall push ups with assist to maintain hand contact on wall RUE and mod facilitation to prevent compensations and use BUE's equally as pt tends to favor LUE Standing: wt  bearing through BUE's to RUE only (feet posteriorly outside BOS) while opening LUE and trunk open into outward rotation/horizontal abduction UBE x 5 min, level 1 for normal reciprocal movement pattern and UB conditioning.   PATIENT EDUCATION: Education details: see above Person educated: Patient and daughter Education method: Explanation, Demonstration, Tactile cues, and Verbal cues Education comprehension: verbalized understanding, returned demonstration, verbal cues required, and tactile cues required  HOME EXERCISE PROGRAM: 02/02/24: initial shoulder flexion ex in supine 02/03/24: scapula HEP, coordination activities (written down by daughter in Spanish) 02/16/2024: coordination with pictures 02/17/24: Putty Activities: Access Code: [email or web address] 02/24/24: sleep positioning handout for hemiplegia   GOALS: Goals reviewed with patient? Yes  SHORT TERM GOALS: Target date: 02/26/24  Independent with HEP for  RUE (Shoulder, coordination, grip)  Baseline: Goal status: MET  2.  Pt to verbalize understanding with pain reduction strategies for RUE  Baseline:  Goal status: MET  3.  Pt to perform light IADLS (folding clothes, washing dishes, etc)  Baseline:  Goal status: MET  4.  Pt to improve writing efficiency by writing paragraph with 90% or greater legibility in reasonable amount of time with A/E prn Baseline:  Goal status: MET   LONG TERM GOALS: Target date: 03/18/24  Pt to perform higher level reaching RUE w/ min compensations and pain less than 5/10 to retrieve light weight objects from shelf Baseline:  Goal status: INITIAL  2.  Grip strength Rt hand to consistently be 55 lbs or greater Baseline: 52.9 lbs 02/26/2024: 59 lbs Goal status: MET  3.  Improve coordination Rt hand as evidenced by performing 9 hole peg test in 38 sec or less Baseline: 42.97 sec 02/26/2024: 32 sec Goal status: MET  4.  Pt to perform light meal prep with supervision safely Baseline:  Goal status:  INITIAL  5.  Pt to verbalize understanding with memory compensatory strategies Baseline:  Goal status: MET  ASSESSMENT:  CLINICAL IMPRESSION: Patient seen today for occupational therapy treatment for s/p acute CVA 12/29/23 with increased Rt sided weakness. He demonstrates improved trunk/pelvis flexibility and more ability to disassociate movements today. Recommend further proximal stability to promote distal mobility and overall functional use of RUE.    PERFORMANCE DEFICITS: in functional skills including ADLs, IADLs, coordination, dexterity, ROM, strength, pain, Fine motor control, Gross motor control, mobility, decreased knowledge of use of DME, vision, and UE functional use, cognitive skills including memory,   IMPAIRMENTS: are limiting patient from ADLs, IADLs, leisure, and social participation.   CO-MORBIDITIES: may have co-morbidities  that affects occupational performance. Patient will benefit from skilled OT to address above impairments and improve overall function.  REHAB POTENTIAL: Good  PLAN:  OT FREQUENCY: 1-2x/week  OT DURATION: 6 weeks  PLANNED INTERVENTIONS: 97535 self care/ADL training, 02889 therapeutic exercise, 97530 therapeutic activity, 97112 neuromuscular re-education, 97140 manual therapy, 97113 aquatic therapy, 97018 paraffin, 02960 fluidotherapy, 97010 moist heat, 97760 Orthotic Initial, 97763 Orthotic/Prosthetic subsequent, passive range of motion, functional mobility training, coping strategies training, patient/family education, and DME and/or AE instructions  RECOMMENDED OTHER SERVICES: none at this time  CONSULTED AND AGREED WITH PLAN OF CARE: Patient  PLAN FOR NEXT SESSION: reaching activities, scapula stabilization, UBE D/C next session per pt request - check remaining goals (in bold)     Burnard JINNY Roads, OT 03/09/2024, 9:43 AM

## 2024-03-11 ENCOUNTER — Ambulatory Visit: Admitting: Occupational Therapy

## 2024-03-11 ENCOUNTER — Ambulatory Visit: Admitting: Physical Therapy

## 2024-03-11 ENCOUNTER — Encounter: Payer: Self-pay | Admitting: Physical Therapy

## 2024-03-11 DIAGNOSIS — I69351 Hemiplegia and hemiparesis following cerebral infarction affecting right dominant side: Secondary | ICD-10-CM

## 2024-03-11 DIAGNOSIS — R29818 Other symptoms and signs involving the nervous system: Secondary | ICD-10-CM

## 2024-03-11 DIAGNOSIS — M6281 Muscle weakness (generalized): Secondary | ICD-10-CM

## 2024-03-11 DIAGNOSIS — R278 Other lack of coordination: Secondary | ICD-10-CM | POA: Diagnosis not present

## 2024-03-11 DIAGNOSIS — M25511 Pain in right shoulder: Secondary | ICD-10-CM

## 2024-03-11 DIAGNOSIS — R2689 Other abnormalities of gait and mobility: Secondary | ICD-10-CM

## 2024-03-11 DIAGNOSIS — R208 Other disturbances of skin sensation: Secondary | ICD-10-CM

## 2024-03-11 DIAGNOSIS — R2681 Unsteadiness on feet: Secondary | ICD-10-CM

## 2024-03-11 NOTE — Therapy (Signed)
 " OUTPATIENT PHYSICAL THERAPY NEURO TREATMENT - DISCHARGE SUMMARY   Patient Name: Arthur Howard MRN: [id number] DOB:Feb 10, 1966, 58 y.o., male Today's Date: 03/11/2024   PCP: Amilibia, Jaden, DO REFERRING PROVIDER: Eben Reyes BROCKS, MD  PHYSICAL THERAPY DISCHARGE SUMMARY  Visits from Start of Care: 9  Current functional level related to goals / functional outcomes: See clinical impression statement.   Remaining deficits: Vaulting due to RLE tone - pt is currently considering AFO if insurance covers it (appt w/ [facility] 12/23)   Education / Equipment: Continue HEP.  Slowing down some with gait to prevent excessive LLE vaulting from hypertonicity in the RLE.  Progress towards goals and process for discharge per pt request.   Patient agrees to discharge. Patient goals were met. Patient is being discharged due to being pleased with the current functional level.   END OF SESSION:  PT End of Session - 03/11/24 0930     Visit Number 9    Number of Visits 9    Date for Recertification  03/18/24    Authorization Type [employer]    Authorization Time Period 01-11-24 - 03-23-24    PT Start Time 0930    PT Stop Time 1012    PT Time Calculation (min) 42 min    Equipment Utilized During Treatment Gait belt    Activity Tolerance Patient tolerated treatment well    Behavior During Therapy [facility] for tasks assessed/performed           Past Medical History:  Diagnosis Date   Burning with urination 08/06/2020   Diabetes mellitus without complication (HCC) 2015   Dizziness 04/16/2022   Hyperlipidemia    Increased urinary frequency 04/02/2022   Stroke (HCC) 05/06/2018   R sided weakness   Past Surgical History:  Procedure Laterality Date   ANKLE SURGERY     CLOSED REDUCTION METACARPAL WITH PERCUTANEOUS PINNING Right 07/26/2020   Procedure: CLOSED REDUCTION METACARPAL WITH PERCUTANEOUS PINNING;  Surgeon: Arthur Drivers, MD;  Location: [facility];  Service:  Orthopedics;  Laterality: Right;   Patient Active Problem List   Diagnosis Date Noted   Recent cerebrovascular accident (CVA) 12/29/2023   Elevated BP reading w/ no diagnosis of HTN 12/29/2023   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 type 2 diabetes mellitus (HCC) 05/09/2021   Spasticity as late effect of cerebrovascular accident (CVA) 04/25/2021   Healthcare maintenance 04/25/2021   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 (HCC) 08/02/2018   History of CVA with residual deficit 05/11/2018   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 without macular edema, without long-term current use of insulin  (HCC) 08/12/2014    ONSET DATE: 12-28-23  REFERRING DIAG: R53.1 (ICD-10-CM) - Right sided weakness I69.351 (ICD-10-CM) - 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  No pain today.  He reports some mild pain yesterday from therapy Wednesday but this resolved.    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 with history of CVA with right-sided weakness as late effect who presents with right sided weakness and slurred speech and was admitted for workup of an acute lacunar infarct.     Acute cerebral infarction  Patient presented to the ED on 10/7 with right sided arm and leg weakness with slurred speech since 10/5. Has a history of small vessel disease and stroke in 2020 with sustained right sided weakness and word finding difficulties. MRI of the head demonstrated an acute lacunar infarct of the left corona radiata and posterior left lentiform with choric left pontine lacunar infarct and chronic lacunar infarcts of the right basal ganglia and frontal lobe. Patient was not in treatment window for TNK or LVO.   PAIN:  Are you having pain? Yes: NPRS scale: 0 Pain location: low back Pain description: ache, stabbing  PRECAUTIONS: Fall  RED FLAGS: None   WEIGHT BEARING RESTRICTIONS: No  FALLS: Has patient fallen in last 6 months? No  LIVING ENVIRONMENT: Lives with: lives with their family Lives in: House/apartment Stairs: No Has following equipment at home: [facility]  PLOF: Independent with basic ADLs, Independent with household mobility without device, Independent with community mobility without device, and Independent with transfers  PATIENT GOALS: increase strength in Rt arm and leg   OBJECTIVE:  Note: Objective measures were completed at Evaluation unless otherwise noted.  DIAGNOSTIC FINDINGS: IMPRESSION:MRI 12-29-23 1. Acute lacunar infarct left corona radiata and posterior left lentiform, no hemorrhage or mass effect. 2. Chronic left pontine lacunar infarct with expected evolution and associated Wallerian degeneration. 3. Chronic lacunar infarcts of the right basal ganglia, chronic frontal lobe white matter changes.    COGNITION: Overall cognitive status: Within functional limits for tasks  assessed   SENSATION: Not tested  COORDINATION: Decreased RLE due to decr. strength  POSTURE: No Significant postural limitations  LOWER EXTREMITY ROM:   WNL's except Rt ankle - decreased AROM due to weakness   LOWER EXTREMITY MMT:    MMT Right Eval Left Eval  Hip flexion 4 WNL's  Hip extension    Hip abduction    Hip adduction    Hip internal rotation    Hip external rotation    Knee flexion 4   Knee extension 5   Ankle dorsiflexion 2+ - 3-   Ankle plantarflexion 3-   Ankle inversion    Ankle eversion    (Blank rows = not tested)  BED MOBILITY:  Not tested  TRANSFERS: Sit to stand: Modified independence  Assistive device utilized: None     Stand to sit: Complete Independence  Assistive device utilized: None      STAIRS: Not tested GAIT: Findings: Gait Characteristics: step through pattern and decreased ankle dorsiflexion- Right, Distance walked: 75', Assistive device utilized:None, Level of assistance: Modified independence, and Comments: increased Rt foot supination in swing/stance  FUNCTIONAL TESTS:  Timed up and go (TUG): 15.41 secs without device without use of AFO on RLE:  14.19 secs with AFO on RLE - no device 10 meter walk test: 14.94 secs  without AFO = 2.20 ft/sec;  11.66 secs with AFO on RLE = 2.81 ft/sec with AFO on RLE                                                                                                                              TREATMENT DATE: 03/11/24 -TUG:  10.37 sec no AD IND - :  7.56 sec no AD IND = 1.32 m/sec OR 4.37 ft/sec -DGI:  [facility] PT Assessment - 03/11/24 0941       Dynamic Gait Index   Level Surface Mild Impairment    Change in Gait Speed Normal    Gait with Horizontal Head Turns Mild Impairment    Gait with Vertical Head Turns Mild Impairment    Gait and Pivot Turn Mild Impairment    Step Over Obstacle Mild Impairment    Step Around Obstacles Normal    Steps Mild Impairment    Total Score 18    DGI comment:  18/24 = high fall risk         -SciFit multi-peaks mode up to level 5.0 over 8 minutes using BUE/BLE for large amplitude reciprocal mobility and global strengthening.  Average stride achieved 11.3 inches. -Standing windmills 2x10; return demo -Standing rotation reach 2x10; min cues and facilitation of right trunk rotation to increase depth of ROM -Seated forward fold into forward leaning reach 2x10  PATIENT EDUCATION: Education details: Continue HEP.  Slowing down some with gait to prevent excessive LLE vaulting from hypertonicity in the RLE.  Progress towards goals and process for discharge per pt request. Person educated: Patient and Child(ren) Education method: Explanation, Demonstration, and Handouts Education comprehension: verbalized understanding and returned demonstration  HOME EXERCISE PROGRAM: Access Code: [email or web address] URL: [email or web address] Date: 01/13/2024 Prepared by: Rock Kussmaul  Exercises - Standing Bilateral Gastroc Stretch with Step  - 3 x daily - 7 x weekly - 1 sets - 1-2 reps - 20-30 sec hold - Single Leg Heel Raise with Counter Support  - 2-3 x daily - 7 x weekly - 1 sets - 10 reps - Seated Eccentric Ankle Plantar Flexion with Resistance - Straight Leg  - 2-3 x daily - 7 x weekly - 1 sets - 10 reps - Seated Ankle Dorsiflexion with Resistance  - 1-2 x daily - 7 x weekly - 3 sets - 10 reps - 2 sec hold  Updated HEP:  Access Code: [email or web address] Date: 01/27/2024 Prepared by: Rock Kussmaul  Exercises - Supine Active Straight Leg Raise  - 1 x daily - 7 x weekly - 2 sets - 10 reps - Hip flexion in hooklying (BOTH KNEES BENT)  - 1 x daily - 7 x weekly - 2 sets - 10 reps - Single Leg Bridge  - 1 x daily - 7 x weekly - 1 sets - 10 reps - Supine Bridge with Leg Extension  - 1 x daily - 7 x weekly - 1 sets - 10 reps - Sidelying  Hip Abduction  - 1 x daily - 7 x weekly - 2 sets - 10 reps - Clamshell  - 1 x daily - 7 x weekly - 2  sets - 10 reps - Seated Hamstring Curl with Anchored Resistance  - 1 x daily - 7 x weekly - 2 sets - 10 reps - Standing Gastroc Stretch  - 1 x daily - 7 x weekly - 1 sets - 1-2 reps - 20-30 sec hold  GOALS: Goals reviewed with patient? Yes  SHORT TERM GOALS: Target date: 02-12-24  Obtain order for Rt AFO from MD. Baseline:  Appt scheduled 11/25 (11/21); saw [facility] 11/25 - awaiting information on insurance coverage (11/26) Goal status: MET  2.  Increase TUG score to </= 13.5 secs without device (without use of AFO unless obtained by this date). Baseline: 15.41 secs; 11.35 sec no AD SBA (11/26) Goal status: MET  3.  Increase gait velocity to >/= 2.60 ft/sec without device for increased gait efficiency.  Baseline: 14.94 secs = 2.20 ft/sec; 1.10 m/sec OR 3.63 ft/sec (11/26) Goal status: MET  4.  Independent in HEP for RLE strengthening. Baseline: IND (11/21) Goal status: MET  5.  Perform DGI with LTG to be set.  Baseline: 13/24 (10/28) Goal status: MET   LONG TERM GOALS: Target date: 03-11-24  Pt will independently don and doff AFO on RLE.  Baseline: deferred until pt decides if he is purchasing the AFO Goal status: NOT MET  2.   Increase TUG score to </= 12.0 secs without device (without use of AFO unless obtained by this date). Baseline: 15.41 secs; 11.35 sec no AD SBA (11/26); 10.37 sec no AD IND (12/19) Goal status: MET  3.  Increase gait velocity to >/= 3.83 ft/sec without device for increased gait efficiency.  Baseline: 14.94 secs = 2.20 ft/sec; 1.10 m/sec OR 3.63 ft/sec (11/26); 4.37 ft/sec IND (12/19) Goal status: MET  4.  Improve DGI score by at least 4 points.  Baseline: 13/24 (10/28); 18/24 (12/19) Goal status: MET  5.  Negotiate 4 steps with 1 rail using step over step sequence modified independently. Baseline: reciprocal w/ rail (12/19) Goal status: MET  ASSESSMENT:  CLINICAL IMPRESSION: Initially planned for re-certification today based on pt progress  and possible continued bracing needs following 12/23 [facility] appt.  Pt requesting to discharge as he is pleased with his progress thus far and sees his MD in January who can refer him back if need be.  He has made great progress, remains motivated to participate with activities at home, and has excellent family support.  He was encouraged to slow down his gait today to limit continued vaulting due to tone.  His DGI improved to 18/24 from prior 13.  He ambulates at a speed of 4.37 ft/sec IND and completes the TUG again today in under 12 seconds.  He is able to perform stairs without a rail descending step to, but with a rail can manage all portions reciprocally.  Will proceed with discharge from PT per pt request.  OBJECTIVE IMPAIRMENTS: Abnormal gait, decreased activity tolerance, decreased balance, decreased ROM, decreased strength, and impaired UE functional use.   ACTIVITY LIMITATIONS: carrying, lifting, stairs, and locomotion level  PARTICIPATION LIMITATIONS: meal prep, cleaning, driving, shopping, community activity, and yard work  PERSONAL FACTORS: Past/current experiences and 1-2 comorbidities: h/o previous Lt CVA are also affecting patient's functional outcome.   REHAB POTENTIAL: Good  CLINICAL DECISION MAKING: Evolving/moderate complexity  EVALUATION COMPLEXITY: Moderate  PLAN:  PT  FREQUENCY: 1x/week  PT DURATION: 8 weeks + eval  PLANNED INTERVENTIONS: 97110-Therapeutic exercises, 97530- Therapeutic activity, [id number]- Neuromuscular re-education, [phone]- Self Care, 02883- Gait training, [phone]- Orthotic Initial, [phone]- Orthotic/Prosthetic subsequent, and Patient/Family education  PLAN FOR NEXT SESSION:  N/A  Daved KATHEE Bull, PT, DPT 03/11/2024, 10:14 AM        "

## 2024-03-11 NOTE — Therapy (Signed)
 " OUTPATIENT OCCUPATIONAL THERAPY NEURO TREATMENT  Patient Name: Arthur Howard MRN: [id number] DOB:07-31-1965, 58 y.o., male Today's Date: 03/11/2024  PCP: Amilbia, Jaden, DO  REFERRING PROVIDER: Francesco Elsie NOVAK, MD  END OF SESSION:  OT End of Session - 03/11/24 1019     Visit Number 11    Number of Visits 12    Date for Recertification  03/18/24   Updated per signed POC   Authorization Type [employer] MCR - auth required    Authorization Time Period APPROVED 12 visits from 02/02/24 - 05/02/24    OT Start Time 1018    OT Stop Time 1100    OT Time Calculation (min) 42 min    Activity Tolerance Patient tolerated treatment well    Behavior During Therapy [facility] for tasks assessed/performed         Past Medical History:  Diagnosis Date   Burning with urination 08/06/2020   Diabetes mellitus without complication (HCC) 2015   Dizziness 04/16/2022   Hyperlipidemia    Increased urinary frequency 04/02/2022   Stroke (HCC) 05/06/2018   R sided weakness   Past Surgical History:  Procedure Laterality Date   ANKLE SURGERY     CLOSED REDUCTION METACARPAL WITH PERCUTANEOUS PINNING Right 07/26/2020   Procedure: CLOSED REDUCTION METACARPAL WITH PERCUTANEOUS PINNING;  Surgeon: Murrell Drivers, MD;  Location:  SUR[facility]ERY C[facility];  Service: Orthopedics;  Laterality: Right;   Patient Active Problem List   Diagnosis Date Noted   Recent cerebrovascular accident (CVA) 12/29/2023   Elevated BP reading w/ no diagnosis of HTN 12/29/2023   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 type 2 diabetes mellitus (HCC) 05/09/2021   Spasticity as late effect of cerebrovascular accident (CVA) 04/25/2021   Healthcare maintenance 04/25/2021   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 (HCC) 08/02/2018   History of CVA with residual deficit 05/11/2018   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  without macular edema, without long-term current use of insulin  (HCC) 08/12/2014   ONSET DATE: 01/14/2024 (referral date)   REFERRING DIAG: I63.9 (ICD-10-CM) - Acute cerebral infarction (HCC) Z86.73 (ICD-10-CM) - Recent cerebrovascular accident (CVA)  THERAPY DIAG:  Hemiplegia and hemiparesis following cerebral infarction affecting right dominant side (HCC)  Right shoulder pain, unspecified chronicity  Other lack of coordination  Muscle weakness (generalized)  Other symptoms and signs involving the nervous system  Other disturbances of skin sensation  Rationale for Evaluation and Treatment: Rehabilitation  SUBJECTIVE:   SUBJECTIVE STATEMENT:  No pain. Pt wishes to d/c for now (after appointment this Monday on 03/14/24). Pt understands he will need new referral to return. Offered to renew however pt politely wanted to d/c after next Monday  Pt accompanied by: Daughter (interprets for him)   PERTINENT HISTORY: presented to ED on 12/29/23 with R UE and LE weakness MRI(+) lacunar infarct L corona radiata and posterior L lentiform plus chronic R & L lacunar infarcts  PMH L pontine CVA 2020 with R hemiplegia, HTN DM on insulin  HLD, moderate nonproliferative retinopathy  PRECAUTIONS: Other: no driving  WEIGHT BEARING RESTRICTIONS: No  PAIN:  Are you having pain? No  FALLS: Has patient fallen in last 6 months? No  LIVING ENVIRONMENT: Lives with: Lives w/ daughter Lives in: 1 story house w/ no steps to enter Has following equipment at home: None  PLOF: Independent and on disability from first stroke  PATIENT GOALS: work on my arm, writing  OBJECTIVE:  Note: Objective measures were completed at Evaluation unless otherwise noted.  HAND DOMINANCE: Right  ADLs: Overall ADLs: mod I  Transfers/ambulation related to ADLs: independent Eating: mod I  Grooming: mod I  UB Dressing: mod I  LB Dressing: mod I  Toileting: mod I  Bathing: mod I  Tub Shower transfers: mod I   Equipment: none  IADLs: Shopping: daughter does Light housekeeping: pt sweeps, takes out trash Meal Prep: daughter does all the cooking Community mobility: pt does not drive Medication management: independent  Financial management: pt and daughter does together Handwriting: 100% legible and in print (name only)   MOBILITY STATUS: Independent  UPPER EXTREMITY ROM:  RUE 75% at shoulder w/ pain up to 7/10 along middle deltoid, Rt elbow distally WFL's (90% or more ROM).  LUE AROM WNL's  UPPER EXTREMITY MMT:   Not tested  HAND FUNCTION: Grip strength: Right: 52.9 lbs; Left: 70.1 lbs  COORDINATION: 9 Hole Peg test: Right: 42.97 sec; Left: 25.45 sec  SENSATION: WFL  EDEMA: none  MUSCLE TONE: RUE: Mild and Hypertonic  COGNITION: Overall cognitive status: reports decreased memory  VISION: Subjective report: sometimes he sees blurry after cataract sx, but no changes from stroke Baseline vision: none Visual history: corrective eye surgery for cataracts  VISION ASSESSMENT: To be further assessed in functional context  PERCEPTION: Not tested  PRAXIS: Not tested  OBSERVATIONS: Pt with Rt dominant side hemiparesis exacerbated from recent CVA, spanish speaking (family interprets for him), weakness noted Rt scapula                                                                                                                           TODAY'S TREATMENT:   OT reviewed HEP for RUE as noted in pt instructions. Medbridge exercises updated to include shoulder exercises and this was texted to the pt.  Pt simulated cooking and reaching tasks in the kitchen with emphasis on safety and reduction of compensatory movements. Pt able to place pots and pans on stove, counter, and oven with 4 lbs added weight to simulate a pot of water and rice or meat. Pt able to retrieve cups, plates, and a bowl from cabinet with min cues for positioning (closer to counter, using LUE for support, and reducing  L shoulder hike) throughout multiple reps using RUE and reporting only 2/10 R shoulder pain. Pt was encouraged to replicate these tasks at home for safe carryover.   PATIENT EDUCATION: Education details: see above Person educated: Patient and daughter Education method: Explanation, Demonstration, Tactile cues, and Verbal cues Education comprehension: verbalized understanding, returned demonstration, verbal cues required, and tactile cues required  HOME EXERCISE PROGRAM: 02/02/24: initial shoulder flexion ex in supine 02/03/24: scapula HEP, coordination activities (written down by daughter in Spanish) 02/16/2024: coordination with pictures 02/17/24: Putty Activities: Access Code: [email or web address] 02/24/24: sleep positioning handout for hemiplegia  03/11/2024: updated with shoulder exercises and sent to pt's phone Access Code: [email or web address]  GOALS: Goals reviewed with patient? Yes  SHORT TERM GOALS:  MET 4/4   LONG TERM GOALS: Target date: 03/18/24  Pt to perform higher level reaching RUE w/ min compensations and pain less than 5/10 to retrieve light weight objects from shelf Baseline:  Goal status: MET  2.  Grip strength Rt hand to consistently be 55 lbs or greater Baseline: 52.9 lbs 02/26/2024: 59 lbs Goal status: MET  3.  Improve coordination Rt hand as evidenced by performing 9 hole peg test in 38 sec or less Baseline: 42.97 sec 02/26/2024: 32 sec Goal status: MET  4.  Pt to perform light meal prep with supervision safely Baseline:  Goal status: IN PROGRESS  5.  Pt to verbalize understanding with memory compensatory strategies Baseline:  Goal status: MET  ASSESSMENT:  CLINICAL IMPRESSION: Patient seen today for occupational therapy treatment for s/p acute CVA 12/29/23 with increased Rt sided weakness. He demonstrates improved functional use of RUE with less pain and cueing for completion. HEP sent to pt's phone to further carryover efforts as pt is to be d/c at next visit.    PERFORMANCE DEFICITS: in functional skills including ADLs, IADLs, coordination, dexterity, ROM, strength, pain, Fine motor control, Gross motor control, mobility, decreased knowledge of use of DME, vision, and UE functional use, cognitive skills including memory,   IMPAIRMENTS: are limiting patient from ADLs, IADLs, leisure, and social participation.   CO-MORBIDITIES: may have co-morbidities  that affects occupational performance. Patient will benefit from skilled OT to address above impairments and improve overall function.  REHAB POTENTIAL: Good  PLAN:  OT FREQUENCY: 1-2x/week  OT DURATION: 6 weeks  PLANNED INTERVENTIONS: 97535 self care/ADL training, 02889 therapeutic exercise, 97530 therapeutic activity, 97112 neuromuscular re-education, 97140 manual therapy, 97113 aquatic therapy, 97018 paraffin, 02960 fluidotherapy, 97010 moist heat, 97760 Orthotic Initial, 97763 Orthotic/Prosthetic subsequent, passive range of motion, functional mobility training, coping strategies training, patient/family education, and DME and/or AE instructions  RECOMMENDED OTHER SERVICES: none at this time  CONSULTED AND AGREED WITH PLAN OF CARE: Patient  PLAN FOR NEXT SESSION: d/c  Pt was to practice light meal prep at home. How did this go?  Jocelyn CHRISTELLA Bottom, OT 03/11/2024, 10:21 AM           "

## 2024-03-14 ENCOUNTER — Telehealth: Payer: Self-pay | Admitting: *Deleted

## 2024-03-14 ENCOUNTER — Encounter: Payer: Self-pay | Admitting: Occupational Therapy

## 2024-03-14 ENCOUNTER — Ambulatory Visit: Admitting: Occupational Therapy

## 2024-03-14 DIAGNOSIS — R29818 Other symptoms and signs involving the nervous system: Secondary | ICD-10-CM

## 2024-03-14 DIAGNOSIS — M6281 Muscle weakness (generalized): Secondary | ICD-10-CM

## 2024-03-14 DIAGNOSIS — I69351 Hemiplegia and hemiparesis following cerebral infarction affecting right dominant side: Secondary | ICD-10-CM

## 2024-03-14 DIAGNOSIS — R278 Other lack of coordination: Secondary | ICD-10-CM | POA: Diagnosis not present

## 2024-03-14 DIAGNOSIS — R208 Other disturbances of skin sensation: Secondary | ICD-10-CM

## 2024-03-14 DIAGNOSIS — M25511 Pain in right shoulder: Secondary | ICD-10-CM

## 2024-03-14 NOTE — Telephone Encounter (Signed)
 Copied from CRM #[id number]. Topic: Referral - Prior Authorization Question >> Mar 11, 2024 11:57 AM Graeme ORN wrote: Reason for CRM: Received a call from [facility] clinic Phone:  [phone] option 1. Would like to check on request. Sent Standard order from that needs to be completed by provider for AFO requested along with notes. Faxed 12/15 Thank You >> Mar 14, 2024  1:49 PM Zane F wrote: Caller: Melissa  Calling From [facility]: [facility] in [place], [place]    Address: [address], [place], [place] [address] Phone: [phone]  Calling in reference to update on the clinic receiving the Standard Written order form for the AFO Brace that Dr.  Ozell Nearing ordered via a referral and sent over to them on 01/14/2024 but was not received until 01/29/2024. They require this form to further assist the patient in getting the Brace approved for authorization by the patient's insurance. Specialist did review the patient chart and it does look like that referral has since been closed. Specialist informed the caller that paperwork does take about 7-10 business days.   Deadline for Submission of the order form is: No later than this Wednesday on 03/16/2024  Callback Number: [phone]  Fax Number: [phone]

## 2024-03-14 NOTE — Therapy (Signed)
 " OUTPATIENT OCCUPATIONAL THERAPY NEURO TREATMENT/DISCHARGE  Patient Name: Arthur Howard MRN: [id number] DOB:[birth year], 58 y.o., male Today's Date: 03/14/2024  PCP: Amilbia, Jaden, DO  REFERRING PROVIDER: Francesco Elsie NOVAK, MD   OCCUPATIONAL THERAPY DISCHARGE SUMMARY  Visits from Start of Care: 12  Current functional level related to goals / functional outcomes: Pt has met all STG's and LTG's   Remaining deficits: Coordination RUE RUE high range ROM   Education / Equipment: See below HEP's and education   Patient agrees to discharge. Patient goals were met. Patient is being discharged due to meeting all goals and pt request.     END OF SESSION:  OT End of Session - 03/14/24 0901     Visit Number 12    Number of Visits 12    Date for Recertification  03/18/24   Updated per signed POC   Authorization Type [employer] MCR - auth required    Authorization Time Period APPROVED 12 visits from 02/02/24 - 05/02/24    OT Start Time 0900    OT Stop Time 0930    OT Time Calculation (min) 30 min    Activity Tolerance Patient tolerated treatment well    Behavior During Therapy [facility] for tasks assessed/performed         Past Medical History:  Diagnosis Date   Burning with urination 08/06/2020   Diabetes mellitus without complication (HCC) 2015   Dizziness 04/16/2022   Hyperlipidemia    Increased urinary frequency 04/02/2022   Stroke (HCC) 05/06/2018   R sided weakness   Past Surgical History:  Procedure Laterality Date   ANKLE SURGERY     CLOSED REDUCTION METACARPAL WITH PERCUTANEOUS PINNING Right 07/26/2020   Procedure: CLOSED REDUCTION METACARPAL WITH PERCUTANEOUS PINNING;  Surgeon: Murrell Drivers, MD;  Location: [facility];  Service: Orthopedics;  Laterality: Right;   Patient Active Problem List   Diagnosis Date Noted   Recent cerebrovascular accident (CVA) 12/29/2023   Elevated BP reading w/ no diagnosis of HTN 12/29/2023   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 2 diabetes mellitus (HCC) 05/09/2021   Spasticity as late effect of cerebrovascular accident (CVA) 04/25/2021   Healthcare maintenance 04/25/2021   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 (HCC) 08/02/2018   History of CVA with residual deficit 05/11/2018   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 without macular edema, without long-term current use of insulin  (HCC) 08/12/2014   ONSET DATE: 01/14/2024 (referral date)   REFERRING DIAG: I63.9 (ICD-10-CM) - Acute cerebral infarction (HCC) Z86.73 (ICD-10-CM) - Recent cerebrovascular accident (CVA)  THERAPY DIAG:  Hemiplegia and hemiparesis following cerebral infarction affecting right dominant side (HCC)  Right shoulder pain, unspecified chronicity  Other lack of coordination  Muscle weakness (generalized)  Other symptoms and signs involving the nervous system  Other disturbances of skin sensation  Rationale for Evaluation and Treatment: Rehabilitation  SUBJECTIVE:   SUBJECTIVE STATEMENT:  I did cook some and it went well   Pt accompanied by: Daughter (interprets for him)   PERTINENT HISTORY: presented to ED on 12/29/23 with R UE and LE weakness MRI(+) lacunar infarct L corona radiata and posterior L lentiform plus chronic R & L lacunar infarcts  PMH L pontine CVA 2020 with R hemiplegia, HTN DM on insulin  HLD, moderate nonproliferative retinopathy  PRECAUTIONS: Other: no driving  WEIGHT BEARING RESTRICTIONS: No  PAIN:  Are you having pain? No  FALLS: Has patient fallen  in last 6 months? No  LIVING ENVIRONMENT: Lives with: Lives w/ daughter Lives in: 1 story house w/ no steps to enter Has following equipment at home: None  PLOF: Independent and on disability from first stroke  PATIENT GOALS: work on my arm, writing  OBJECTIVE:  Note: Objective measures were completed at Evaluation  unless otherwise noted.  HAND DOMINANCE: Right  ADLs: Overall ADLs: mod I  Transfers/ambulation related to ADLs: independent Eating: mod I  Grooming: mod I  UB Dressing: mod I  LB Dressing: mod I  Toileting: mod I  Bathing: mod I  Tub Shower transfers: mod I  Equipment: none  IADLs: Shopping: daughter does Light housekeeping: pt sweeps, takes out trash Meal Prep: daughter does all the cooking Community mobility: pt does not drive Medication management: independent  Financial management: pt and daughter does together Handwriting: 100% legible and in print (name only)   MOBILITY STATUS: Independent  UPPER EXTREMITY ROM:  RUE 75% at shoulder w/ pain up to 7/10 along middle deltoid, Rt elbow distally WFL's (90% or more ROM).  LUE AROM WNL's  UPPER EXTREMITY MMT:   Not tested  HAND FUNCTION: Grip strength: Right: 52.9 lbs; Left: 70.1 lbs  COORDINATION: 9 Hole Peg test: Right: 42.97 sec; Left: 25.45 sec  SENSATION: WFL  EDEMA: none  MUSCLE TONE: RUE: Mild and Hypertonic  COGNITION: Overall cognitive status: reports decreased memory  VISION: Subjective report: sometimes he sees blurry after cataract sx, but no changes from stroke Baseline vision: none Visual history: corrective eye surgery for cataracts  VISION ASSESSMENT: To be further assessed in functional context  PERCEPTION: Not tested  PRAXIS: Not tested  OBSERVATIONS: Pt with Rt dominant side hemiparesis exacerbated from recent CVA, spanish speaking (family interprets for him), weakness noted Rt scapula                                                                                                                           TODAY'S TREATMENT:   Pt reports cooking went well and has met last LTG.  Pt copying peg design using small pegs for Rt hand coordination and functional use, as well as visual scanning and visual perceptual skills. Pt did not copy design correctly however with prompts and some cueing  for strategy, pt able to correct w/ therapist assist taking some out and starting over Gripper set at level 3 resistance to pick up blocks Rt hand for sustained grip strength, graded control, coordination, and functional use of Rt hand UBE x 8 min, level 1 for normal reciprocal movement pattern and UB conditioning   PATIENT EDUCATION: Education details: see above Person educated: Patient and daughter Education method: Explanation, Demonstration, Tactile cues, and Verbal cues Education comprehension: verbalized understanding, returned demonstration, verbal cues required, and tactile cues required  HOME EXERCISE PROGRAM: 02/02/24: initial shoulder flexion ex in supine 02/03/24: scapula HEP, coordination activities (written down by daughter in Spanish) 02/16/2024: coordination with pictures 02/17/24: Putty Activities: Access Code: [email or web address]  02/24/24: sleep positioning handout for hemiplegia  03/11/2024: updated with shoulder exercises and sent to pt's phone Access Code: [email or web address]  GOALS: Goals reviewed with patient? Yes  SHORT TERM GOALS:  MET 4/4   LONG TERM GOALS: Target date: 03/18/24  Pt to perform higher level reaching RUE w/ min compensations and pain less than 5/10 to retrieve light weight objects from shelf Baseline:  Goal status: MET  2.  Grip strength Rt hand to consistently be 55 lbs or greater Baseline: 52.9 lbs 02/26/2024: 59 lbs Goal status: MET  3.  Improve coordination Rt hand as evidenced by performing 9 hole peg test in 38 sec or less Baseline: 42.97 sec 02/26/2024: 32 sec Goal status: MET  4.  Pt to perform light meal prep with supervision safely Baseline:  Goal status: MET   5.  Pt to verbalize understanding with memory compensatory strategies Baseline:  Goal status: MET  ASSESSMENT:  CLINICAL IMPRESSION: Patient seen today for occupational therapy treatment for s/p acute CVA 12/29/23 with increased Rt sided weakness. Pt has met all STG's and LTG's and  doing well. Pt had requested d/c today  PERFORMANCE DEFICITS: in functional skills including ADLs, IADLs, coordination, dexterity, ROM, strength, pain, Fine motor control, Gross motor control, mobility, decreased knowledge of use of DME, vision, and UE functional use, cognitive skills including memory,   IMPAIRMENTS: are limiting patient from ADLs, IADLs, leisure, and social participation.   CO-MORBIDITIES: may have co-morbidities  that affects occupational performance. Patient will benefit from skilled OT to address above impairments and improve overall function.  REHAB POTENTIAL: Good  PLAN:  OT FREQUENCY: 1-2x/week  OT DURATION: 6 weeks  PLANNED INTERVENTIONS: 97535 self care/ADL training, 02889 therapeutic exercise, 97530 therapeutic activity, 97112 neuromuscular re-education, 97140 manual therapy, 97113 aquatic therapy, 97018 paraffin, 02960 fluidotherapy, 97010 moist heat, 97760 Orthotic Initial, 97763 Orthotic/Prosthetic subsequent, passive range of motion, functional mobility training, coping strategies training, patient/family education, and DME and/or AE instructions  RECOMMENDED OTHER SERVICES: none at this time  CONSULTED AND AGREED WITH PLAN OF CARE: Patient  PLAN  D/C O.T.    Burnard JINNY Roads, OT 03/14/2024, 9:02 AM           "

## 2024-03-14 NOTE — Telephone Encounter (Addendum)
 Call from [facility] with [facility] Wanted to check on the status of paperwork for AFO order for Dr Elicia  Pt has appt on 12/23 with [employer] emailed form to CMA Will see if another MD can sign form today (as Dr Zheng is not avail), or pt will have to be rescheduled to another day.

## 2024-03-15 ENCOUNTER — Ambulatory Visit: Admitting: Occupational Therapy

## 2024-03-15 NOTE — Telephone Encounter (Signed)
 Form has been completed and faxed back to [facility] with the last office notes, confirmation went through.

## 2024-03-20 ENCOUNTER — Other Ambulatory Visit: Payer: Self-pay

## 2024-03-21 ENCOUNTER — Ambulatory Visit: Admitting: Occupational Therapy

## 2024-03-21 NOTE — Telephone Encounter (Signed)
 Medication sent to pharmacy

## 2024-03-22 ENCOUNTER — Ambulatory Visit: Admitting: Occupational Therapy

## 2024-03-28 NOTE — Telephone Encounter (Signed)
" °  Name: Arthur Howard, Gee MRN: [id number]  Date: 04/01/2024 Status: Sch  Time: 10:15 AM Length: 30  Visit Type: PHYSICAL [8004] Copay: $0.00  Provider: Harrie Bruckner, DO      Copied from CRM [id number]. Topic: Appointments - Scheduling Inquiry for Clinic >> Mar 28, 2024  1:13 PM Chiquita SQUIBB wrote: Reason for CRM: Patients daughter Olam is calling in to schedule her fathers annual physical appointment when going through the DT it denied a physical and stated to send a message to the admin pool for scheduling. Please contact Lisa back. "

## 2024-03-28 NOTE — Telephone Encounter (Signed)
 Rtn Call with Spanish Inter/to sch an appt with any provider.  Message was left by the Spanish Inter/to please call back and sch with any provider ava.  Copied from CRM [id number]. Topic: Appointments - Appointment Scheduling >> Mar 25, 2024 10:04 AM Mercer PEDLAR wrote: Arthur Howard (daughter) would like a callback to schedule a physical. I got denial from the DT when attempted.

## 2024-04-01 ENCOUNTER — Ambulatory Visit: Payer: Self-pay | Admitting: Student

## 2024-04-01 ENCOUNTER — Other Ambulatory Visit (HOSPITAL_COMMUNITY): Payer: Self-pay

## 2024-04-01 ENCOUNTER — Encounter: Payer: Self-pay | Admitting: Student

## 2024-04-01 VITALS — BP 121/74 | HR 68 | Temp 98.0°F | Ht 66.0 in | Wt 144.6 lb

## 2024-04-01 DIAGNOSIS — E1169 Type 2 diabetes mellitus with other specified complication: Secondary | ICD-10-CM | POA: Diagnosis not present

## 2024-04-01 DIAGNOSIS — I6930 Unspecified sequelae of cerebral infarction: Secondary | ICD-10-CM

## 2024-04-01 DIAGNOSIS — E785 Hyperlipidemia, unspecified: Secondary | ICD-10-CM

## 2024-04-01 DIAGNOSIS — E113293 Type 2 diabetes mellitus with mild nonproliferative diabetic retinopathy without macular edema, bilateral: Secondary | ICD-10-CM

## 2024-04-01 DIAGNOSIS — Z7984 Long term (current) use of oral hypoglycemic drugs: Secondary | ICD-10-CM | POA: Diagnosis not present

## 2024-04-01 DIAGNOSIS — R3911 Hesitancy of micturition: Secondary | ICD-10-CM | POA: Diagnosis not present

## 2024-04-01 DIAGNOSIS — N401 Enlarged prostate with lower urinary tract symptoms: Secondary | ICD-10-CM | POA: Diagnosis not present

## 2024-04-01 MED ORDER — TAMSULOSIN HCL 0.4 MG PO CAPS
0.4000 mg | ORAL_CAPSULE | Freq: Every day | ORAL | 3 refills | Status: AC
  Filled 2024-04-01: qty 30, 30d supply, fill #0

## 2024-04-01 MED ORDER — CLOPIDOGREL BISULFATE 75 MG PO TABS
75.0000 mg | ORAL_TABLET | Freq: Every day | ORAL | 3 refills | Status: AC
  Filled 2024-04-01 – 2024-04-11 (×3): qty 90, 90d supply, fill #0

## 2024-04-01 MED ORDER — ATORVASTATIN CALCIUM 80 MG PO TABS
80.0000 mg | ORAL_TABLET | Freq: Every day | ORAL | 3 refills | Status: AC
  Filled 2024-04-01: qty 30, 30d supply, fill #0

## 2024-04-01 NOTE — Assessment & Plan Note (Signed)
 History of 2 strokes in the 2020s, most recent was last year.  Given his relatively young age and now 2 strokes could consider LDL goal below 55. - Check lipids today - Continue atorvastatin  40, consider increase - Increase continue clopidogrel  75 daily

## 2024-04-01 NOTE — Patient Instructions (Addendum)
 New medicine is tamsulosin  which will help with urination  El nuevo medicamento es tamsulosina, que ayudar a geographical information systems officer.

## 2024-04-01 NOTE — Progress Notes (Signed)
" ° °  CC: General Checkup and discuss prostate  HPI:  [name] is a 59 y.o. male with a PMH stated below who presents today for evaluation.  Please see problem based assessment and plan for additional details.  Past Medical History:  Diagnosis Date   Burning with urination 08/06/2020   Diabetes mellitus without complication (HCC) 2015   Dizziness 04/16/2022   Hyperlipidemia    Increased urinary frequency 04/02/2022   Stroke (HCC) 05/06/2018   R sided weakness   Review of Systems: ROS negative except for what is noted on the assessment and plan.  Vitals:   04/01/24 0954  BP: 121/74  Pulse: 68  Temp: 98 F (36.7 C)  TempSrc: Oral  SpO2: 95%  Weight: 144 lb 9.6 oz (65.6 kg)  Height: 5' 6 (1.676 m)    Physical Exam: Constitutional: well-appearing and in no acute distress Cardiovascular: regular rate and rhythm, no m/r/g Pulmonary/Chest: normal work of breathing on room air, lungs clear to auscultation bilaterally Abdominal: soft, non-tender, non-distended MSK: normal bulk and tone Skin: warm and dry Psych: normal mood and behavior  Assessment & Plan:   Patient discussed with Dr. Francesco Assessment & Plan Benign prostatic hyperplasia with urinary hesitancy He primarily wanted to discuss his prostate.  He is requesting screening for prostate cancer.  He does not have a history of prostate cancer or contact with such.  He does however report increased urinary hesitancy and some nocturia over the past 6 months without dysuria.  DRE today but he declined. - Tamsulosin  0.4 daily - Check PSA - Check UA History of CVA with residual deficit History of 2 strokes in the 2020s, most recent was last year.  Given his relatively young age and now 2 strokes could consider LDL goal below 55. 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  Most recent 6.6 but at times above goal of 7. - Continue Janumet  daily and Jardiance  25 daily  RTC 3 months for general check, A2c, may be able to stretch visits after that  Arthur Howard, D.O. [facility], PGY-2 Phone: [phone] Date 04/01/2024 Time 12:16 PM "

## 2024-04-01 NOTE — Assessment & Plan Note (Signed)
 Will check A1c.  Most recent 6.6 but at times above goal of 7. - Continue Janumet  daily and Jardiance  25 daily

## 2024-04-02 LAB — LIPID PANEL
Chol/HDL Ratio: 3.8 ratio (ref 0.0–5.0)
Cholesterol, Total: 147 mg/dL (ref 100–199)
HDL: 39 mg/dL — ABNORMAL LOW
LDL Chol Calc (NIH): 75 mg/dL (ref 0–99)
Triglycerides: 197 mg/dL — ABNORMAL HIGH (ref 0–149)
VLDL Cholesterol Cal: 33 mg/dL (ref 5–40)

## 2024-04-02 LAB — MICROALBUMIN / CREATININE URINE RATIO
Creatinine, Urine: 31.6 mg/dL
Microalb/Creat Ratio: <9 mg/g{creat} (ref 0–29)
Microalbumin, Urine: <3 ug/mL

## 2024-04-02 LAB — URINALYSIS, ROUTINE W REFLEX MICROSCOPIC
Bilirubin, UA: NEGATIVE
Ketones, UA: NEGATIVE
Leukocytes,UA: NEGATIVE
Nitrite, UA: NEGATIVE
Protein,UA: NEGATIVE
RBC, UA: NEGATIVE
Urobilinogen, Ur: 0.2 mg/dL (ref 0.2–1.0)
pH, UA: 5.5 (ref 5.0–7.5)

## 2024-04-02 LAB — HEMOGLOBIN A1C
Est. average glucose Bld gHb Est-mCnc: 151 mg/dL
Hgb A1c MFr Bld: 6.9 % — ABNORMAL HIGH (ref 4.8–5.6)

## 2024-04-02 LAB — PSA: Prostate Specific Ag, Serum: 0.7 ng/mL (ref 0.0–4.0)

## 2024-04-02 NOTE — Progress Notes (Signed)
 Internal Medicine Clinic Attending  Case discussed with the resident at the time of the visit.  We reviewed the resident's history and exam and pertinent patient test results.  I agree with the assessment, diagnosis, and plan of care documented in the resident's note.

## 2024-04-03 ENCOUNTER — Other Ambulatory Visit: Payer: Self-pay

## 2024-04-04 ENCOUNTER — Ambulatory Visit: Payer: Self-pay | Admitting: Student

## 2024-04-04 ENCOUNTER — Other Ambulatory Visit (HOSPITAL_COMMUNITY): Payer: Self-pay

## 2024-04-05 ENCOUNTER — Other Ambulatory Visit (HOSPITAL_COMMUNITY): Payer: Self-pay

## 2024-04-11 ENCOUNTER — Other Ambulatory Visit: Payer: Self-pay

## 2024-05-26 ENCOUNTER — Ambulatory Visit: Admitting: Adult Health
